# Patient Record
Sex: Female | Born: 1950 | Race: White | Hispanic: No | Smoking: Current every day smoker
Health system: Southern US, Community
[De-identification: ages and names within clinical notes are randomized; demographics above are authoritative.]

## PROBLEM LIST (undated history)

## (undated) DIAGNOSIS — K219 Gastro-esophageal reflux disease without esophagitis: Secondary | ICD-10-CM

## (undated) DIAGNOSIS — I10 Essential (primary) hypertension: Secondary | ICD-10-CM

## (undated) DIAGNOSIS — F32A Depression, unspecified: Secondary | ICD-10-CM

## (undated) DIAGNOSIS — F4541 Pain disorder exclusively related to psychological factors: Secondary | ICD-10-CM

## (undated) DIAGNOSIS — R51 Headache: Secondary | ICD-10-CM

## (undated) DIAGNOSIS — E785 Hyperlipidemia, unspecified: Secondary | ICD-10-CM

## (undated) DIAGNOSIS — B009 Herpesviral infection, unspecified: Secondary | ICD-10-CM

## (undated) DIAGNOSIS — E782 Mixed hyperlipidemia: Secondary | ICD-10-CM

## (undated) DIAGNOSIS — R42 Dizziness and giddiness: Secondary | ICD-10-CM

## (undated) DIAGNOSIS — J439 Emphysema, unspecified: Secondary | ICD-10-CM

## (undated) DIAGNOSIS — R918 Other nonspecific abnormal finding of lung field: Secondary | ICD-10-CM

## (undated) DIAGNOSIS — I712 Thoracic aortic aneurysm, without rupture, unspecified: Secondary | ICD-10-CM

## (undated) DIAGNOSIS — Z8719 Personal history of other diseases of the digestive system: Secondary | ICD-10-CM

## (undated) DIAGNOSIS — R29898 Other symptoms and signs involving the musculoskeletal system: Secondary | ICD-10-CM

## (undated) DIAGNOSIS — I129 Hypertensive chronic kidney disease with stage 1 through stage 4 chronic kidney disease, or unspecified chronic kidney disease: Secondary | ICD-10-CM

## (undated) DIAGNOSIS — R519 Headache, unspecified: Secondary | ICD-10-CM

## (undated) DIAGNOSIS — E559 Vitamin D deficiency, unspecified: Secondary | ICD-10-CM

## (undated) DIAGNOSIS — E039 Hypothyroidism, unspecified: Secondary | ICD-10-CM

## (undated) DIAGNOSIS — I5189 Other ill-defined heart diseases: Secondary | ICD-10-CM

## (undated) DIAGNOSIS — F419 Anxiety disorder, unspecified: Secondary | ICD-10-CM

## (undated) DIAGNOSIS — G4700 Insomnia, unspecified: Secondary | ICD-10-CM

## (undated) DIAGNOSIS — Z7982 Long term (current) use of aspirin: Secondary | ICD-10-CM

## (undated) DIAGNOSIS — M810 Age-related osteoporosis without current pathological fracture: Secondary | ICD-10-CM

## (undated) DIAGNOSIS — I2510 Atherosclerotic heart disease of native coronary artery without angina pectoris: Secondary | ICD-10-CM

## (undated) DIAGNOSIS — F329 Major depressive disorder, single episode, unspecified: Secondary | ICD-10-CM

## (undated) DIAGNOSIS — G5600 Carpal tunnel syndrome, unspecified upper limb: Secondary | ICD-10-CM

## (undated) DIAGNOSIS — I716 Thoracoabdominal aortic aneurysm, without rupture, unspecified: Secondary | ICD-10-CM

## (undated) DIAGNOSIS — I719 Aortic aneurysm of unspecified site, without rupture: Secondary | ICD-10-CM

## (undated) HISTORY — DX: Herpesviral infection, unspecified: B00.9

## (undated) HISTORY — DX: Hyperlipidemia, unspecified: E78.5

## (undated) HISTORY — DX: Carpal tunnel syndrome, unspecified upper limb: G56.00

## (undated) HISTORY — PX: TUBAL LIGATION: SHX77

## (undated) HISTORY — DX: Major depressive disorder, single episode, unspecified: F32.9

## (undated) HISTORY — DX: Anxiety disorder, unspecified: F41.9

## (undated) HISTORY — PX: COLONOSCOPY: SHX174

## (undated) HISTORY — DX: Depression, unspecified: F32.A

## (undated) HISTORY — DX: Age-related osteoporosis without current pathological fracture: M81.0

## (undated) HISTORY — DX: Insomnia, unspecified: G47.00

## (undated) HISTORY — DX: Gastro-esophageal reflux disease without esophagitis: K21.9

---

## 2008-10-08 DIAGNOSIS — R42 Dizziness and giddiness: Secondary | ICD-10-CM

## 2008-10-08 HISTORY — DX: Dizziness and giddiness: R42

## 2010-12-14 ENCOUNTER — Ambulatory Visit: Payer: Self-pay | Admitting: Family Medicine

## 2012-06-30 ENCOUNTER — Ambulatory Visit: Payer: Self-pay | Admitting: Internal Medicine

## 2012-07-02 ENCOUNTER — Ambulatory Visit: Payer: Self-pay | Admitting: Physician Assistant

## 2016-04-13 ENCOUNTER — Other Ambulatory Visit: Payer: Self-pay | Admitting: Physician Assistant

## 2016-04-13 DIAGNOSIS — R10819 Abdominal tenderness, unspecified site: Secondary | ICD-10-CM

## 2016-04-13 DIAGNOSIS — R634 Abnormal weight loss: Secondary | ICD-10-CM

## 2016-05-02 ENCOUNTER — Ambulatory Visit
Admission: RE | Admit: 2016-05-02 | Discharge: 2016-05-02 | Disposition: A | Discharge status: Home / Self Care | Payer: BLUE CROSS/BLUE SHIELD | Source: Ambulatory Visit | Attending: Physician Assistant | Admitting: Physician Assistant

## 2016-05-02 DIAGNOSIS — R911 Solitary pulmonary nodule: Secondary | ICD-10-CM | POA: Insufficient documentation

## 2016-05-02 DIAGNOSIS — R10819 Abdominal tenderness, unspecified site: Secondary | ICD-10-CM | POA: Insufficient documentation

## 2016-05-02 DIAGNOSIS — R634 Abnormal weight loss: Secondary | ICD-10-CM | POA: Diagnosis present

## 2016-05-02 LAB — POCT I-STAT CREATININE: Creatinine, Ser: 1 mg/dL (ref 0.44–1.00)

## 2016-05-02 MED ORDER — IOPAMIDOL (ISOVUE-300) INJECTION 61%
85.0000 mL | Freq: Once | INTRAVENOUS | Status: AC | PRN
  Administered 2016-05-02: 85 mL via INTRAVENOUS

## 2016-05-10 ENCOUNTER — Other Ambulatory Visit: Payer: Self-pay | Admitting: Physician Assistant

## 2016-05-10 DIAGNOSIS — R911 Solitary pulmonary nodule: Secondary | ICD-10-CM

## 2016-05-16 ENCOUNTER — Ambulatory Visit
Admission: RE | Admit: 2016-05-16 | Discharge: 2016-05-16 | Disposition: A | Discharge status: Home / Self Care | Payer: BLUE CROSS/BLUE SHIELD | Source: Ambulatory Visit | Attending: Physician Assistant | Admitting: Physician Assistant

## 2016-05-16 DIAGNOSIS — I700 Atherosclerosis of aorta: Secondary | ICD-10-CM | POA: Diagnosis not present

## 2016-05-16 DIAGNOSIS — R911 Solitary pulmonary nodule: Secondary | ICD-10-CM

## 2016-05-16 DIAGNOSIS — I2510 Atherosclerotic heart disease of native coronary artery without angina pectoris: Secondary | ICD-10-CM | POA: Insufficient documentation

## 2016-05-16 DIAGNOSIS — R918 Other nonspecific abnormal finding of lung field: Secondary | ICD-10-CM | POA: Insufficient documentation

## 2016-06-08 ENCOUNTER — Telehealth: Payer: Self-pay | Admitting: Gastroenterology

## 2016-06-08 ENCOUNTER — Other Ambulatory Visit: Payer: Self-pay

## 2016-06-08 NOTE — Telephone Encounter (Signed)
Dr Humphrey Rolls office need STAT EGD scheduled for patient. You can call Magda Paganini at Dr Laurelyn Sickle office. [phone] ext [phone]

## 2016-06-08 NOTE — Telephone Encounter (Signed)
I spoke with Joy Carpenter from Dr. Laurelyn Sickle office. Per Dr. Allen Norris, I will schedule the patient for an upper endoscopy on either Wednesday 6th at 8:00 am or on Thursday the 7th.   I called the patient and left her a voicemail so that she can call me to schedule her upper endoscopy. If I don't hear back from the patient today, I will call her on Tuesday ( We are closed on Monday 06/11/16 for Labor Day)

## 2016-06-12 ENCOUNTER — Other Ambulatory Visit: Payer: Self-pay

## 2016-06-12 ENCOUNTER — Encounter: Payer: Self-pay | Admitting: *Deleted

## 2016-06-12 MED ORDER — NA SULFATE-K SULFATE-MG SULF 17.5-3.13-1.6 GM/177ML PO SOLN: 1.0000 | Freq: Once | ORAL | 0 refills | Status: AC

## 2016-06-12 NOTE — Telephone Encounter (Signed)
Gastroenterology Pre-Procedure Review  Request Date: 06/14/2016 Requesting Physician: Dr. Humphrey Rolls  PATIENT REVIEW QUESTIONS: The patient responded to the following health history questions as indicated:    1. Are you having any GI issues? yes (abdominal pain) 2. Do you have a personal history of Polyps? no 3. Do you have a family history of Colon Cancer or Polyps? no 4. Diabetes Mellitus? no 5. Joint replacements in the past 12 months?no 6. Major health problems in the past 3 months?no 7. Any artificial heart valves, MVP, or defibrillator?no    MEDICATIONS & ALLERGIES:    Patient reports the following regarding taking any anticoagulation/antiplatelet therapy:   Plavix, Coumadin, Eliquis, Xarelto, Lovenox, Pradaxa, Brilinta, or Effient? no Aspirin? yes ([facility])  Patient confirms/reports the following medications:  Current Outpatient Prescriptions  Medication Sig Dispense Refill  . ALPRAZolam (XANAX) 0.25 MG tablet Take 0.25 mg by mouth daily as needed. for anxiety  3  . aspirin EC 81 MG tablet Take 81 mg by mouth daily.    . ciprofloxacin (CIPRO) 500 MG tablet Take 500 mg by mouth 2 (two) times daily.  0  . levothyroxine (SYNTHROID, LEVOTHROID) 50 MCG tablet ONE TAB BY MOUTH IN THE MORNING 30 MINUTES PRIOR TO FOOD.  1  . pantoprazole (PROTONIX) 40 MG tablet Take 40 mg by mouth 2 (two) times daily.  2  . polyethylene glycol powder (GLYCOLAX/MIRALAX) powder AS DIRECTED FOR COLONIC PREP.  0  . simvastatin (ZOCOR) 20 MG tablet Take 20 mg by mouth daily.  1  . zolpidem (AMBIEN) 10 MG tablet Take 10 mg by mouth at bedtime.  3  . valACYclovir (VALTREX) 500 MG tablet TAKE 1 TABLET BY MOUTH EVERY DAY FOR OUTBREAK AS NEEDED  5   No current facility-administered medications for this visit.     Patient confirms/reports the following allergies:  No Known Allergies  No orders of the defined types were placed in this encounter.   AUTHORIZATION INFORMATION Primary Insurance: 1D#: Group  #:  Secondary Insurance: 1D#: Group #:  SCHEDULE INFORMATION: Date: 06/14/2016 Time: Location: [facility]

## 2016-06-12 NOTE — Telephone Encounter (Signed)
Dysphagia R13.10 Screening Colonoscopy Z12.11 [facility] [id number] Pre cert

## 2016-06-13 NOTE — Discharge Instructions (Signed)

## 2016-06-14 ENCOUNTER — Ambulatory Visit: Payer: BLUE CROSS/BLUE SHIELD | Admitting: Anesthesiology

## 2016-06-14 ENCOUNTER — Encounter
Admission: RE | Disposition: A | Discharge status: Home / Self Care | Payer: Self-pay | Source: Ambulatory Visit | Attending: Gastroenterology

## 2016-06-14 ENCOUNTER — Ambulatory Visit
Admission: RE | Admit: 2016-06-14 | Discharge: 2016-06-14 | Disposition: A | Discharge status: Home / Self Care | Payer: BLUE CROSS/BLUE SHIELD | Source: Ambulatory Visit | Attending: Gastroenterology | Admitting: Gastroenterology

## 2016-06-14 DIAGNOSIS — Z79899 Other long term (current) drug therapy: Secondary | ICD-10-CM

## 2016-06-14 DIAGNOSIS — R198 Other specified symptoms and signs involving the digestive system and abdomen: Secondary | ICD-10-CM

## 2016-06-14 DIAGNOSIS — F17210 Nicotine dependence, cigarettes, uncomplicated: Secondary | ICD-10-CM | POA: Diagnosis not present

## 2016-06-14 DIAGNOSIS — Z7982 Long term (current) use of aspirin: Secondary | ICD-10-CM | POA: Diagnosis not present

## 2016-06-14 DIAGNOSIS — K219 Gastro-esophageal reflux disease without esophagitis: Secondary | ICD-10-CM | POA: Diagnosis not present

## 2016-06-14 DIAGNOSIS — R1013 Epigastric pain: Secondary | ICD-10-CM

## 2016-06-14 DIAGNOSIS — R634 Abnormal weight loss: Secondary | ICD-10-CM | POA: Insufficient documentation

## 2016-06-14 DIAGNOSIS — K228 Other specified diseases of esophagus: Secondary | ICD-10-CM | POA: Diagnosis not present

## 2016-06-14 DIAGNOSIS — K2950 Unspecified chronic gastritis without bleeding: Secondary | ICD-10-CM | POA: Insufficient documentation

## 2016-06-14 DIAGNOSIS — Z681 Body mass index (BMI) 19 or less, adult: Secondary | ICD-10-CM | POA: Insufficient documentation

## 2016-06-14 DIAGNOSIS — J449 Chronic obstructive pulmonary disease, unspecified: Secondary | ICD-10-CM | POA: Diagnosis not present

## 2016-06-14 DIAGNOSIS — R1310 Dysphagia, unspecified: Secondary | ICD-10-CM | POA: Diagnosis not present

## 2016-06-14 DIAGNOSIS — K648 Other hemorrhoids: Secondary | ICD-10-CM | POA: Insufficient documentation

## 2016-06-14 DIAGNOSIS — I739 Peripheral vascular disease, unspecified: Secondary | ICD-10-CM | POA: Diagnosis not present

## 2016-06-14 DIAGNOSIS — K2970 Gastritis, unspecified, without bleeding: Secondary | ICD-10-CM

## 2016-06-14 DIAGNOSIS — Z1211 Encounter for screening for malignant neoplasm of colon: Secondary | ICD-10-CM | POA: Diagnosis not present

## 2016-06-14 DIAGNOSIS — R933 Abnormal findings on diagnostic imaging of other parts of digestive tract: Secondary | ICD-10-CM

## 2016-06-14 DIAGNOSIS — K2289 Other specified disease of esophagus: Secondary | ICD-10-CM

## 2016-06-14 DIAGNOSIS — K5730 Diverticulosis of large intestine without perforation or abscess without bleeding: Secondary | ICD-10-CM | POA: Diagnosis not present

## 2016-06-14 HISTORY — DX: Headache, unspecified: R51.9

## 2016-06-14 HISTORY — PX: ESOPHAGOGASTRODUODENOSCOPY (EGD) WITH PROPOFOL: SHX5813

## 2016-06-14 HISTORY — DX: Other symptoms and signs involving the musculoskeletal system: R29.898

## 2016-06-14 HISTORY — PX: COLONOSCOPY WITH PROPOFOL: SHX5780

## 2016-06-14 HISTORY — DX: Emphysema, unspecified: J43.9

## 2016-06-14 HISTORY — DX: Dizziness and giddiness: R42

## 2016-06-14 HISTORY — DX: Headache: R51

## 2016-06-14 HISTORY — DX: Aortic aneurysm of unspecified site, without rupture: I71.9

## 2016-06-14 SURGERY — ESOPHAGOGASTRODUODENOSCOPY (EGD) WITH PROPOFOL
Anesthesia: Monitor Anesthesia Care | Wound class: Clean Contaminated

## 2016-06-14 MED ORDER — LACTATED RINGERS IV SOLN
INTRAVENOUS | Status: DC | PRN
  Administered 2016-06-14: 12:00:00 via INTRAVENOUS

## 2016-06-14 MED ORDER — STERILE WATER FOR IRRIGATION IR SOLN
Status: DC | PRN
  Administered 2016-06-14: 12:00:00

## 2016-06-14 MED ORDER — PROPOFOL 10 MG/ML IV BOLUS
INTRAVENOUS | Status: DC | PRN
  Administered 2016-06-14 (×2): 20 mg via INTRAVENOUS
  Administered 2016-06-14: 10 mg via INTRAVENOUS
  Administered 2016-06-14: 20 mg via INTRAVENOUS
  Administered 2016-06-14: 30 mg via INTRAVENOUS
  Administered 2016-06-14: 10 mg via INTRAVENOUS
  Administered 2016-06-14: 60 mg via INTRAVENOUS
  Administered 2016-06-14 (×4): 20 mg via INTRAVENOUS

## 2016-06-14 MED ORDER — LACTATED RINGERS IV SOLN: INTRAVENOUS | Status: DC

## 2016-06-14 MED ORDER — GLYCOPYRROLATE 0.2 MG/ML IJ SOLN
INTRAMUSCULAR | Status: DC | PRN
  Administered 2016-06-14: 0.2 mg via INTRAVENOUS

## 2016-06-14 MED ORDER — ACETAMINOPHEN 160 MG/5ML PO SOLN: 325.0000 mg | ORAL | Status: DC | PRN

## 2016-06-14 MED ORDER — LIDOCAINE HCL (CARDIAC) 20 MG/ML IV SOLN
INTRAVENOUS | Status: DC | PRN
  Administered 2016-06-14: 50 mg via INTRAVENOUS

## 2016-06-14 MED ORDER — ACETAMINOPHEN 325 MG PO TABS: 325.0000 mg | ORAL_TABLET | ORAL | Status: DC | PRN

## 2016-06-14 SURGICAL SUPPLY — 35 items
BALLN DILATOR 10-12 8 (BALLOONS)
BALLN DILATOR 12-15 8 (BALLOONS)
BALLN DILATOR 15-18 8 (BALLOONS)
BALLN DILATOR CRE 0-12 8 (BALLOONS)
BALLN DILATOR ESOPH 8 10 CRE (MISCELLANEOUS) IMPLANT
BALLOON DILATOR 12-15 8 (BALLOONS) IMPLANT
BALLOON DILATOR 15-18 8 (BALLOONS) IMPLANT
BALLOON DILATOR CRE 0-12 8 (BALLOONS) IMPLANT
BLOCK BITE 60FR ADLT L/F GRN (MISCELLANEOUS) ×2 IMPLANT
CANISTER SUCT 1200ML W/VALVE (MISCELLANEOUS) ×2 IMPLANT
CLIP HMST 235XBRD CATH ROT (MISCELLANEOUS) IMPLANT
CLIP RESOLUTION 360 11X235 (MISCELLANEOUS)
FCP ESCP3.2XJMB 240X2.8X (MISCELLANEOUS)
FORCEPS BIOP RAD 4 LRG CAP 4 (CUTTING FORCEPS) ×2 IMPLANT
FORCEPS BIOP RJ4 240 W/NDL (MISCELLANEOUS)
FORCEPS ESCP3.2XJMB 240X2.8X (MISCELLANEOUS) IMPLANT
GOWN CVR UNV OPN BCK APRN NK (MISCELLANEOUS) ×2 IMPLANT
GOWN ISOL THUMB LOOP REG UNIV (MISCELLANEOUS) ×2
INJECTOR VARIJECT VIN23 (MISCELLANEOUS) IMPLANT
KIT DEFENDO VALVE AND CONN (KITS) IMPLANT
KIT ENDO PROCEDURE OLY (KITS) ×2 IMPLANT
MARKER SPOT ENDO TATTOO 5ML (MISCELLANEOUS) IMPLANT
PAD GROUND ADULT SPLIT (MISCELLANEOUS) IMPLANT
PROBE APC STR FIRE (PROBE) IMPLANT
RETRIEVER NET PLAT FOOD (MISCELLANEOUS) IMPLANT
RETRIEVER NET ROTH 2.5X230 LF (MISCELLANEOUS) IMPLANT
SNARE SHORT THROW 13M SML OVAL (MISCELLANEOUS) IMPLANT
SNARE SHORT THROW 30M LRG OVAL (MISCELLANEOUS) IMPLANT
SNARE SNG USE RND 15MM (INSTRUMENTS) IMPLANT
SPOT EX ENDOSCOPIC TATTOO (MISCELLANEOUS)
SYR INFLATION 60ML (SYRINGE) IMPLANT
TRAP ETRAP POLY (MISCELLANEOUS) IMPLANT
VARIJECT INJECTOR VIN23 (MISCELLANEOUS)
WATER STERILE IRR 250ML POUR (IV SOLUTION) ×2 IMPLANT
WIRE CRE 18-20MM 8CM F G (MISCELLANEOUS) IMPLANT

## 2016-06-14 NOTE — Anesthesia Preprocedure Evaluation (Signed)
Anesthesia Evaluation  Patient identified by MRN, date of birth, ID band Patient awake    Reviewed: Allergy & Precautions, H&P , NPO status , Patient's Chart, lab work & pertinent test results  Airway Mallampati: II  TM Distance: >3 FB Neck ROM: full    Dental no notable dental hx.    Pulmonary COPD, Current Smoker,    Pulmonary exam normal        Cardiovascular + Peripheral Vascular Disease  Normal cardiovascular exam     Neuro/Psych    GI/Hepatic   Endo/Other    Renal/GU      Musculoskeletal   Abdominal   Peds  Hematology   Anesthesia Other Findings   Reproductive/Obstetrics                             Anesthesia Physical Anesthesia Plan  ASA: III  Anesthesia Plan: MAC   Post-op Pain Management:    Induction:   Airway Management Planned:   Additional Equipment:   Intra-op Plan:   Post-operative Plan:   Informed Consent: I have reviewed the patients History and Physical, chart, labs and discussed the procedure including the risks, benefits and alternatives for the proposed anesthesia with the patient or authorized representative who has indicated his/her understanding and acceptance.     Plan Discussed with:   Anesthesia Plan Comments:         Anesthesia Quick Evaluation

## 2016-06-14 NOTE — Anesthesia Procedure Notes (Signed)
Procedure Name: MAC Performed by: Zissel Biederman Pre-anesthesia Checklist: Patient identified, Emergency Drugs available, Suction available, Timeout performed and Patient being monitored Patient Re-evaluated:Patient Re-evaluated prior to inductionOxygen Delivery Method: Nasal cannula Placement Confirmation: positive ETCO2       

## 2016-06-14 NOTE — H&P (Signed)
Joy Lame, MD [address] [place], [place] [address] Phone: [phone] Fax : [phone]  Primary Care Physician:  Christie Nottingham., PA Primary Gastroenterologist:  Dr. Allen Norris  Pre-Procedure History & Physical: HPI:  Joy Carpenter is a 65 y.o. female is here for an endoscopy and colonoscopy.   Past Medical History:  Diagnosis Date  . Aortic aneurysm ([place])   . Emphysema of lung ([place])    "mild"  . Headache    daily - stress  . Vertigo    1 episode - approx 2010   . Weakness of both legs    intermittent    Past Surgical History:  Procedure Laterality Date  . COLONOSCOPY    . TUBAL LIGATION      Prior to Admission medications   Medication Sig Start Date End Date Taking? Authorizing Provider  ALPRAZolam (XANAX) 0.25 MG tablet Take 0.25 mg by mouth daily as needed. for anxiety 05/23/16  Yes Historical Provider, MD  aspirin EC 81 MG tablet Take 81 mg by mouth daily.   Yes Historical Provider, MD  ciprofloxacin (CIPRO) 500 MG tablet Take 500 mg by mouth 2 (two) times daily. 06/07/16  Yes Historical Provider, MD  levothyroxine (SYNTHROID, LEVOTHROID) 50 MCG tablet ONE TAB BY MOUTH IN THE MORNING 30 MINUTES PRIOR TO FOOD. 06/04/16  Yes Historical Provider, MD  pantoprazole (PROTONIX) 40 MG tablet Take 40 mg by mouth 2 (two) times daily. 05/25/16  Yes Historical Provider, MD  polyethylene glycol powder (GLYCOLAX/MIRALAX) powder AS DIRECTED FOR COLONIC PREP. 05/30/16  Yes Historical Provider, MD  simvastatin (ZOCOR) 20 MG tablet Take 20 mg by mouth daily. 06/04/16  Yes Historical Provider, MD  valACYclovir (VALTREX) 500 MG tablet TAKE 1 TABLET BY MOUTH EVERY DAY FOR OUTBREAK AS NEEDED 03/09/16  Yes Historical Provider, MD  zolpidem (AMBIEN) 10 MG tablet Take 10 mg by mouth at bedtime. 05/10/16  Yes Historical Provider, MD    Allergies as of 06/12/2016  . (No Known Allergies)    History reviewed. No pertinent family history.  Social History   Social History  .  Marital status: Divorced    Spouse name: N/A  . Number of children: N/A  . Years of education: N/A   Occupational History  . Not on file.   Social History Main Topics  . Smoking status: Current Every Day Smoker    Packs/day: 1.00    Years: 30.00    Types: Cigarettes  . Smokeless tobacco: Never Used  . Alcohol use No  . Drug use: Unknown  . Sexual activity: Not on file   Other Topics Concern  . Not on file   Social History Narrative  . No narrative on file    Review of Systems: See HPI, otherwise negative ROS  Physical Exam: BP 118/75   Pulse 70   Temp 97.3 F (36.3 C) (Temporal)   Resp 16   Ht 5\' 7"  (1.702 m)   Wt 125 lb (56.7 kg)   SpO2 96%   BMI 19.58 kg/m  General:   Alert,  pleasant and cooperative in NAD Head:  Normocephalic and atraumatic. Neck:  Supple; no masses or thyromegaly. Lungs:  Clear throughout to auscultation.    Heart:  Regular rate and rhythm. Abdomen:  Soft, nontender and nondistended. Normal bowel sounds, without guarding, and without rebound.   Neurologic:  Alert and  oriented x4;  grossly normal neurologically.  Impression/Plan: [name] is here for an endoscopy and colonoscopy to be performed  for dysphagia with weight loss.  Risks, benefits, limitations, and alternatives regarding  endoscopy and colonoscopy have been reviewed with the patient.  Questions have been answered.  All parties agreeable.   Joy Lame, MD  06/14/2016, 11:09 AM

## 2016-06-14 NOTE — Transfer of Care (Signed)
Immediate Anesthesia Transfer of Care Note  Patient: Joy Carpenter  Procedure(s) Performed: Procedure(s): ESOPHAGOGASTRODUODENOSCOPY (EGD) WITH PROPOFOL (N/A) COLONOSCOPY WITH PROPOFOL (N/A)  Patient Location: PACU  Anesthesia Type: MAC  Level of Consciousness: awake, alert  and patient cooperative  Airway and Oxygen Therapy: Patient Spontanous Breathing and Patient connected to supplemental oxygen  Post-op Assessment: Post-op Vital signs reviewed, Patient's Cardiovascular Status Stable, Respiratory Function Stable, Patent Airway and No signs of Nausea or vomiting  Post-op Vital Signs: Reviewed and stable  Complications: No apparent anesthesia complications

## 2016-06-14 NOTE — Op Note (Signed)
[facility] Gastroenterology Patient Name: Joy Carpenter Procedure Date: 06/14/2016 11:29 AM MRN: [id number] Account #: [id number] Date of Birth: 1951-04-12 Admit Type: Outpatient Age: 65 Room: [facility] OR ROOM 01 Gender: Female Note Status: Finalized Procedure:            Colonoscopy Indications:          Screening for colorectal malignant neoplasm Providers:            Lucilla Lame MD, MD Medicines:            Propofol per Anesthesia Complications:        No immediate complications. Procedure:            Pre-Anesthesia Assessment:                       - Prior to the procedure, a History and Physical was                        performed, and patient medications and allergies were                        reviewed. The patient's tolerance of previous                        anesthesia was also reviewed. The risks and benefits of                        the procedure and the sedation options and risks were                        discussed with the patient. All questions were                        answered, and informed consent was obtained. Prior                        Anticoagulants: The patient has taken no previous                        anticoagulant or antiplatelet agents. ASA Grade                        Assessment: II - A patient with mild systemic disease.                        After reviewing the risks and benefits, the patient was                        deemed in satisfactory condition to undergo the                        procedure.                       After obtaining informed consent, the colonoscope was                        passed under direct vision. Throughout the procedure,                        the patient's blood pressure,  pulse, and oxygen                        saturations were monitored continuously. The Olympus                        CF-HQ190L Colonoscope (S#. [id number]) was introduced                        through the anus and advanced to the  the cecum,                        identified by appendiceal orifice and ileocecal valve.                        The colonoscopy was performed without difficulty. The                        patient tolerated the procedure well. The quality of                        the bowel preparation was excellent. Findings:      The perianal and digital rectal examinations were normal.      Multiple small-mouthed diverticula were found in the sigmoid colon.      Non-bleeding internal hemorrhoids were found during retroflexion. The       hemorrhoids were Grade II (internal hemorrhoids that prolapse but reduce       spontaneously). Impression:           - Diverticulosis in the sigmoid colon.                       - Non-bleeding internal hemorrhoids.                       - No specimens collected. Recommendation:       - Repeat colonoscopy in 10 years for screening unless                        any change in family history or lower GI problems. Procedure Code(s):    --- Professional ---                       [phone], Colonoscopy, flexible; diagnostic, including                        collection of specimen(s) by brushing or washing, when                        performed (separate procedure) Diagnosis Code(s):    --- Professional ---                       Z12.11, Encounter for screening for malignant neoplasm                        of colon CPT copyright 2016 American Medical Association. All rights reserved. The codes documented in this report are preliminary and upon coder review may  be revised to meet current compliance requirements. Lucilla Lame MD, MD 06/14/2016 11:58:13 AM This report has been signed electronically. Number of Addenda: 0 Note Initiated On: 06/14/2016 11:29  AM Scope Withdrawal Time: 0 hours 6 minutes 2 seconds  Total Procedure Duration: 0 hours 8 minutes 3 seconds       [facility]

## 2016-06-14 NOTE — Op Note (Signed)
[facility] Gastroenterology Patient Name: Joy Carpenter Procedure Date: 06/14/2016 11:29 AM MRN: [id number] Account #: [id number] Date of Birth: 1951-03-22 Admit Type: Outpatient Age: 65 Room: [facility] OR ROOM 01 Gender: Female Note Status: Finalized Procedure:            Upper GI endoscopy Indications:          Epigastric abdominal pain, Abnormal CT of the GI tract Providers:            Lucilla Lame MD, MD Referring MD:         Leighton Ruff. Turner PA, PA (Referring MD) Medicines:            Propofol per Anesthesia Complications:        No immediate complications. Procedure:            Pre-Anesthesia Assessment:                       - Prior to the procedure, a History and Physical was                        performed, and patient medications and allergies were                        reviewed. The patient's tolerance of previous                        anesthesia was also reviewed. The risks and benefits of                        the procedure and the sedation options and risks were                        discussed with the patient. All questions were                        answered, and informed consent was obtained. Prior                        Anticoagulants: The patient has taken no previous                        anticoagulant or antiplatelet agents. ASA Grade                        Assessment: II - A patient with mild systemic disease.                        After reviewing the risks and benefits, the patient was                        deemed in satisfactory condition to undergo the                        procedure.                       After obtaining informed consent, the endoscope was                        passed under direct vision. Throughout the procedure,  the patient's blood pressure, pulse, and oxygen                        saturations were monitored continuously. The Olympus                        [id number]-HQ190 Endoscope (S#. [id number]) was  introduced                        through the mouth, and advanced to the second part of                        duodenum. The upper GI endoscopy was accomplished                        without difficulty. The patient tolerated the procedure                        well. Findings:      There were esophageal mucosal changes suspicious for long-segment       Barrett's esophagus present in the lower third of the esophagus. Mucosa       was biopsied with a cold forceps for histology randomly in the lower       third of the esophagus.      Localized moderate inflammation characterized by erythema was found in       the gastric antrum. Biopsies were taken with a cold forceps for       histology.      The examined duodenum was normal. Impression:           - Esophageal mucosal changes suspicious for                        long-segment Barrett's esophagus. Biopsied.                       - Gastritis. Biopsied.                       - Normal examined duodenum. Recommendation:       - Await pathology results.                       - Discharge patient to home.                       - Resume previous diet.                       - Continue present medications. Procedure Code(s):    --- Professional ---                       [phone], Esophagogastroduodenoscopy, flexible, transoral;                        with biopsy, single or multiple Diagnosis Code(s):    --- Professional ---                       R10.13, Epigastric pain                       R93.3, Abnormal findings on diagnostic imaging of other  parts of digestive tract                       K29.70, Gastritis, unspecified, without bleeding                       K22.8, Other specified diseases of esophagus CPT copyright 2016 American Medical Association. All rights reserved. The codes documented in this report are preliminary and upon coder review may  be revised to meet current compliance requirements. Lucilla Lame MD,  MD 06/14/2016 11:47:18 AM This report has been signed electronically. Number of Addenda: 0 Note Initiated On: 06/14/2016 11:29 AM Total Procedure Duration: 0 hours 3 minutes 37 seconds       [facility]

## 2016-06-14 NOTE — Anesthesia Postprocedure Evaluation (Signed)
Anesthesia Post Note  Patient: Joy Carpenter  Procedure(s) Performed: Procedure(s) (LRB): ESOPHAGOGASTRODUODENOSCOPY (EGD) WITH PROPOFOL (N/A) COLONOSCOPY WITH PROPOFOL (N/A)  Patient location during evaluation: PACU Anesthesia Type: MAC Level of consciousness: awake and alert and oriented Pain management: satisfactory to patient Vital Signs Assessment: post-procedure vital signs reviewed and stable Respiratory status: spontaneous breathing, nonlabored ventilation and respiratory function stable Cardiovascular status: blood pressure returned to baseline and stable Postop Assessment: Adequate PO intake and No signs of nausea or vomiting Anesthetic complications: no    Raliegh Ip

## 2016-06-15 ENCOUNTER — Encounter: Payer: Self-pay | Admitting: Gastroenterology

## 2016-06-18 ENCOUNTER — Telehealth: Payer: Self-pay | Admitting: Gastroenterology

## 2016-06-18 NOTE — Telephone Encounter (Signed)
Left a voice message for patient to call and schedule appointment with Dr. Allen Norris for GERD. Referred by [facility].

## 2016-06-20 ENCOUNTER — Encounter: Payer: Self-pay | Admitting: Gastroenterology

## 2016-06-21 ENCOUNTER — Encounter: Payer: Self-pay | Admitting: Gastroenterology

## 2016-07-31 ENCOUNTER — Encounter (INDEPENDENT_AMBULATORY_CARE_PROVIDER_SITE_OTHER): Payer: Self-pay

## 2016-07-31 ENCOUNTER — Ambulatory Visit (INDEPENDENT_AMBULATORY_CARE_PROVIDER_SITE_OTHER): Payer: BLUE CROSS/BLUE SHIELD | Admitting: Vascular Surgery

## 2016-07-31 ENCOUNTER — Encounter (INDEPENDENT_AMBULATORY_CARE_PROVIDER_SITE_OTHER): Payer: Self-pay | Admitting: Vascular Surgery

## 2016-07-31 VITALS — BP 119/69 | HR 72 | Resp 16 | Ht 66.0 in | Wt 134.0 lb

## 2016-07-31 DIAGNOSIS — I714 Abdominal aortic aneurysm, without rupture, unspecified: Secondary | ICD-10-CM | POA: Insufficient documentation

## 2016-07-31 DIAGNOSIS — E785 Hyperlipidemia, unspecified: Secondary | ICD-10-CM | POA: Insufficient documentation

## 2016-07-31 DIAGNOSIS — I70219 Atherosclerosis of native arteries of extremities with intermittent claudication, unspecified extremity: Secondary | ICD-10-CM | POA: Insufficient documentation

## 2016-07-31 DIAGNOSIS — R1013 Epigastric pain: Secondary | ICD-10-CM | POA: Diagnosis not present

## 2016-07-31 DIAGNOSIS — I70213 Atherosclerosis of native arteries of extremities with intermittent claudication, bilateral legs: Secondary | ICD-10-CM

## 2016-07-31 DIAGNOSIS — F17200 Nicotine dependence, unspecified, uncomplicated: Secondary | ICD-10-CM | POA: Insufficient documentation

## 2016-07-31 NOTE — Assessment & Plan Note (Signed)
Unclear cause.  Work up ongoing.  Unlikely vascular in origin

## 2016-07-31 NOTE — Assessment & Plan Note (Signed)
The patient has noninvasive studies performed by her primary care physician which demonstrated ABIs of approximately 0.9 bilaterally with duplex suggesting more distal disease on ultrasound. Her symptoms are mild to moderate this point and not lifestyle limiting. She continues to work. I have discussed with her appropriate medical therapy with aspirin and statin agent which is already being used. I have strongly recommended tobacco cessation as well as increasing her exercise regimen. No intervention will currently be performed, but I will plan a follow-up visit and we are checking her aneurysm in 4-6 months with follow-up noninvasive studies.

## 2016-07-31 NOTE — Assessment & Plan Note (Signed)
We discussed the absolute need for smoking cessation due to the deleterious nature of tobacco on the vascular system. We discussed the tobacco use would diminish patency of any intervention, and likely significantly worsen progressio of disease. We discussed multiple agents for quitting including replacement therapy or medications to reduce cravings such as Chantix. The patient voices their understanding of the importance of smoking cessation. 

## 2016-07-31 NOTE — Assessment & Plan Note (Signed)
lipid control important in reducing the progression of atherosclerotic disease. Continue statin therapy  

## 2016-07-31 NOTE — Assessment & Plan Note (Signed)
I have independently reviewed the patient's CT scan from a couple of months ago. 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 The maximal diameter is about 4 cm. There does not appear to be significant iliac artery occlusive disease present. I discussed the natural history and pathophysiology of abdominal aortic aneurysms. At this size, rupture would be far less than 1% and repair is not indicated. I would recommend a follow-up ultrasound in 4-6 months for continued surveillance and evaluation. I discussed the importance of tobacco cessation and blood pressure control

## 2016-07-31 NOTE — Patient Instructions (Signed)
Abdominal Aortic Aneurysm An aneurysm is a weakened or damaged part of an artery wall that bulges from the normal force of blood pumping through the body. An abdominal aortic aneurysm is an aneurysm that occurs in the lower part of the aorta, the main artery of the body.  The major concern with an abdominal aortic aneurysm is that it can enlarge and burst (rupture) or blood can flow between the layers of the wall of the aorta through a tear (aorticdissection). Both of these conditions can cause bleeding inside the body and can be life threatening unless diagnosed and treated promptly. CAUSES  The exact cause of an abdominal aortic aneurysm is unknown. Some contributing factors are:   A hardening of the arteries caused by the buildup of fat and other substances in the lining of a blood vessel (arteriosclerosis).  Inflammation of the walls of an artery (arteritis).   Connective tissue diseases, such as Marfan syndrome.   Abdominal trauma.   An infection, such as syphilis or staphylococcus, in the wall of the aorta (infectious aortitis) caused by bacteria. RISK FACTORS  Risk factors that contribute to an abdominal aortic aneurysm may include:  Age older than 60 years.   High blood pressure (hypertension).  Female gender.  Ethnicity (white race).  Obesity.  Family history of aneurysm (first degree relatives only).  Tobacco use. PREVENTION  The following healthy lifestyle habits may help decrease your risk of abdominal aortic aneurysm:  Quitting smoking. Smoking can raise your blood pressure and cause arteriosclerosis.  Limiting or avoiding alcohol.  Keeping your blood pressure, blood sugar level, and cholesterol levels within normal limits.  Decreasing your salt intake. In somepeople, too much salt can raise blood pressure and increase your risk of abdominal aortic aneurysm.  Eating a diet low in saturated fats and cholesterol.  Increasing your fiber intake by including  whole grains, vegetables, and fruits in your diet. Eating these foods may help lower blood pressure.  Maintaining a healthy weight.  Staying physically active and exercising regularly. SYMPTOMS  The symptoms of abdominal aortic aneurysm may vary depending on the size and rate of growth of the aneurysm.Most grow slowly and do not have any symptoms. When symptoms do occur, they may include:  Pain (abdomen, side, lower back, or groin). The pain may vary in intensity. A sudden onset of severe pain may indicate that the aneurysm has ruptured.  Feeling full after eating only small amounts of food.  Nausea or vomiting or both.  Feeling a pulsating lump in the abdomen.  Feeling faint or passing out. DIAGNOSIS  Since most unruptured abdominal aortic aneurysms have no symptoms, they are often discovered during diagnostic exams for other conditions. An aneurysm may be found during the following procedures:  Ultrasonography (A one-time screening for abdominal aortic aneurysm by ultrasonography is also recommended for all men aged 65-75 years who have ever smoked).  X-ray exams.  A computed tomography (CT).  Magnetic resonance imaging (MRI).  Angiography or arteriography. TREATMENT  Treatment of an abdominal aortic aneurysm depends on the size of your aneurysm, your age, and risk factors for rupture. Medication to control blood pressure and pain may be used to manage aneurysms smaller than 6 cm. Regular monitoring for enlargement may be recommended by your caregiver if:  The aneurysm is 3-4 cm in size (an annual ultrasonography may be recommended).  The aneurysm is 4-4.5 cm in size (an ultrasonography every 6 months may be recommended).  The aneurysm is larger than 4.5 cm in   size (your caregiver may ask that you be examined by a vascular surgeon). If your aneurysm is larger than 6 cm, surgical repair may be recommended. There are two main methods for repair of an aneurysm:   Endovascular  repair (a minimally invasive surgery). This is done most often.  Open repair. This method is used if an endovascular repair is not possible.   This information is not intended to replace advice given to you by your health care provider. Make sure you discuss any questions you have with your health care provider.   Document Released: 07/04/2005 Document Revised: 01/19/2013 Document Reviewed: 10/24/2012 Elsevier Interactive Patient Education 2016 [employer] Inc[employer]  

## 2016-07-31 NOTE — Progress Notes (Signed)
Patient ID: Joy Carpenter, female   DOB: 02/11/1951, 65 y.o.   MRN: [id number]  Chief Complaint  Patient presents with  . New Evaluation    Leg tingling and weakness  . AAA  . Aneurysm    HPI Joy Carpenter is a 65 y.o. female.  I am asked to see the patient by Dr. Stephanie Coup for evaluation of peripheral vascular disease as well as an abdominal aortic aneurysm.  The patient reports vague epigastric and abdominal pain which has been difficult to diagnose. She has had multiple studies and continues to have this evaluated. As part of her workup a CT scan was performed which I have independently reviewed. This does demonstrate an approximately 4 cm abdominal aortic aneurysm. The patient also complains of significant claudication symptoms. She describes weakness and tiredness in her legs with short distances of walking. This has been steadily progressing over several months. There is no clear inciting event or causative factor that started the symptoms. She does not have a history of ulceration or infection.   Past Medical History:  Diagnosis Date  . Aortic aneurysm ([place])   . Emphysema of lung ([place])    "mild"  . Headache    daily - stress  . Vertigo    1 episode - approx 2010   . Weakness of both legs    intermittent    Past Surgical History:  Procedure Laterality Date  . COLONOSCOPY    . COLONOSCOPY WITH PROPOFOL N/A 06/14/2016   Procedure: COLONOSCOPY WITH PROPOFOL;  Surgeon: Lucilla Lame, MD;  Location: [facility];  Service: Endoscopy;  Laterality: N/A;  . ESOPHAGOGASTRODUODENOSCOPY (EGD) WITH PROPOFOL N/A 06/14/2016   Procedure: ESOPHAGOGASTRODUODENOSCOPY (EGD) WITH PROPOFOL;  Surgeon: Lucilla Lame, MD;  Location: [facility];  Service: Endoscopy;  Laterality: N/A;  . TUBAL LIGATION      Family History No family history of bleeding disorders, clotting disorders, autoimmune diseases, or aneurysms  Social History Social History  Substance Use Topics  .  Smoking status: Current Every Day Smoker    Packs/day: 1.00    Years: 30.00    Types: Cigarettes  . Smokeless tobacco: Never Used  . Alcohol use No   No IV drug use  No Known Allergies  Current Outpatient Prescriptions  Medication Sig Dispense Refill  . ALPRAZolam (XANAX) 0.25 MG tablet Take 0.25 mg by mouth daily as needed. for anxiety  3  . aspirin EC 81 MG tablet Take 81 mg by mouth daily.    Marland Kitchen levothyroxine (SYNTHROID, LEVOTHROID) 50 MCG tablet ONE TAB BY MOUTH IN THE MORNING 30 MINUTES PRIOR TO FOOD.  1  . pantoprazole (PROTONIX) 40 MG tablet Take 40 mg by mouth 2 (two) times daily.  2  . polyethylene glycol powder (GLYCOLAX/MIRALAX) powder AS DIRECTED FOR COLONIC PREP.  0  . simvastatin (ZOCOR) 20 MG tablet Take 20 mg by mouth daily.  1  . valACYclovir (VALTREX) 500 MG tablet TAKE 1 TABLET BY MOUTH EVERY DAY FOR OUTBREAK AS NEEDED  5  . zolpidem (AMBIEN) 10 MG tablet Take 10 mg by mouth at bedtime.  3   No current facility-administered medications for this visit.       REVIEW OF SYSTEMS (Negative unless checked)  Constitutional: [] Weight loss  [] Fever  [] Chills Cardiac: [] Chest pain   [] Chest pressure   [] Palpitations   [] Shortness of breath when laying flat   [] Shortness of breath at rest   [] Shortness of breath with exertion. Vascular:  [  x]Pain in legs with walking   [] Pain in legs at rest   [] Pain in legs when laying flat   [x] Claudication   [] Pain in feet when walking  [] Pain in feet at rest  [] Pain in feet when laying flat   [] History of DVT   [] Phlebitis   [] Swelling in legs   [] Varicose veins   [] Non-healing ulcers Pulmonary:   [] Uses home oxygen   [] Productive cough   [] Hemoptysis   [] Wheeze  [] COPD   [] Asthma Neurologic:  [] Dizziness  [] Blackouts   [] Seizures   [] History of stroke   [] History of TIA  [] Aphasia   [] Temporary blindness   [] Dysphagia   [] Weakness or numbness in arms   [] Weakness or numbness in legs Musculoskeletal:  [] Arthritis   [] Joint swelling    [] Joint pain   [] Low back pain Hematologic:  [] Easy bruising  [] Easy bleeding   [] Hypercoagulable state   [] Anemic  [] Hepatitis Gastrointestinal:  [] Blood in stool   [] Vomiting blood  [] Gastroesophageal reflux/heartburn   [x] Abdominal pain Genitourinary:  [] Chronic kidney disease   [] Difficult urination  [] Frequent urination  [] Burning with urination   [] Hematuria Skin:  [] Rashes   [] Ulcers   [] Wounds Psychological:  [] History of anxiety   []  History of major depression.    Physical Exam BP 119/69 (BP Location: Right Arm)   Pulse 72   Resp 16   Ht 5\' 6"  (1.676 m)   Wt 134 lb (60.8 kg)   BMI 21.63 kg/m  Gen:  WD/WN, NAD. Appears younger than stated age Head: [place]/AT, No temporalis wasting. Prominent temp pulse not noted. Ear/Nose/Throat: Hearing grossly intact, nares w/o erythema or drainage, oropharynx w/o Erythema/Exudate Eyes: Conjunctiva clear, sclera non-icteric  Neck: trachea midline.  No bruit or JVD.  Pulmonary:  Good air movement, clear to auscultation bilaterally.  Cardiac: RRR, normal S1, S2, no Murmurs, rubs or gallops. Vascular: Aortic impulse is enlarged Vessel Right Left  Radial Palpable Palpable  Ulnar Palpable Palpable  Brachial Palpable Palpable  Carotid Palpable, without bruit Palpable, without bruit  Aorta  palpable N/A  Femoral Palpable Palpable  Popliteal Palpable Palpable  PT Palpable 1+ Palpable  DP Trace Palpable Palpable   Gastrointestinal: soft, non-tender/non-distended. No guarding/reflex. No masses, surgical incisions, or scars. Musculoskeletal: M/S 5/5 throughout.  Extremities without ischemic changes.  No deformity or atrophy. no edema. Neurologic: Sensation grossly intact in extremities.  Symmetrical.  Speech is fluent. Motor exam as listed above. Psychiatric: Judgment intact, Mood & affect appropriate for pt's clinical situation. Dermatologic: No rashes or ulcers noted.  No cellulitis or open wounds. Lymph : No Cervical, Axillary, or Inguinal  lymphadenopathy.   Radiology No results found.  Labs No results found for this or any previous visit (from the past 2160 hour(s)).  Assessment/Plan:  Abdominal pain, epigastric Unclear cause.  Work up ongoing.  Unlikely vascular in origin  Tobacco use disorder We discussed the absolute need for smoking cessation due to the deleterious nature of tobacco on the vascular system. We discussed the tobacco use would diminish patency of any intervention, and likely significantly worsen progressio of disease. We discussed multiple agents for quitting including replacement therapy or medications to reduce cravings such as Chantix. The patient voices their understanding of the importance of smoking cessation.   Hyperlipidemia lipid control important in reducing the progression of atherosclerotic disease. Continue statin therapy   Atherosclerosis of native arteries of extremity with intermittent claudication [facility]) The patient has noninvasive studies performed by her primary care physician which demonstrated ABIs of  approximately 0.9 bilaterally with duplex suggesting more distal disease on ultrasound. Her symptoms are mild to moderate this point and not lifestyle limiting. She continues to work. I have discussed with her appropriate medical therapy with aspirin and statin agent which is already being used. I have strongly recommended tobacco cessation as well as increasing her exercise regimen. No intervention will currently be performed, but I will plan a follow-up visit and we are checking her aneurysm in 4-6 months with follow-up noninvasive studies.  AAA (abdominal aortic aneurysm) without rupture ([place]) I have independently reviewed the patient's CT scan from a couple of months ago. 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 The maximal diameter is about 4 cm. There  does not appear to be significant iliac artery occlusive disease present. I discussed the natural history and pathophysiology of abdominal aortic aneurysms. At this size, rupture would be far less than 1% and repair is not indicated. I would recommend a follow-up ultrasound in 4-6 months for continued surveillance and evaluation. I discussed the importance of tobacco cessation and blood pressure control      Leotis Pain 07/31/2016, 3:57 PM   This note was created with Dragon medical transcription system.  Any errors from dictation are unintentional.

## 2016-09-03 ENCOUNTER — Encounter: Admission: RE | Payer: Self-pay | Source: Ambulatory Visit

## 2016-09-03 ENCOUNTER — Ambulatory Visit
Admission: RE | Admit: 2016-09-03 | Payer: BLUE CROSS/BLUE SHIELD | Source: Ambulatory Visit | Admitting: Gastroenterology

## 2016-09-03 SURGERY — COLONOSCOPY WITH PROPOFOL
Anesthesia: General

## 2016-11-14 ENCOUNTER — Ambulatory Visit (INDEPENDENT_AMBULATORY_CARE_PROVIDER_SITE_OTHER): Payer: BLUE CROSS/BLUE SHIELD | Admitting: Vascular Surgery

## 2016-11-14 ENCOUNTER — Ambulatory Visit (INDEPENDENT_AMBULATORY_CARE_PROVIDER_SITE_OTHER): Payer: BLUE CROSS/BLUE SHIELD

## 2016-11-14 DIAGNOSIS — I70213 Atherosclerosis of native arteries of extremities with intermittent claudication, bilateral legs: Secondary | ICD-10-CM

## 2016-11-14 DIAGNOSIS — I714 Abdominal aortic aneurysm, without rupture, unspecified: Secondary | ICD-10-CM

## 2016-11-22 ENCOUNTER — Encounter (INDEPENDENT_AMBULATORY_CARE_PROVIDER_SITE_OTHER): Payer: Self-pay | Admitting: Vascular Surgery

## 2017-02-11 ENCOUNTER — Other Ambulatory Visit (INDEPENDENT_AMBULATORY_CARE_PROVIDER_SITE_OTHER): Payer: Self-pay | Admitting: Vascular Surgery

## 2017-02-11 DIAGNOSIS — I714 Abdominal aortic aneurysm, without rupture, unspecified: Secondary | ICD-10-CM

## 2017-02-12 ENCOUNTER — Encounter (INDEPENDENT_AMBULATORY_CARE_PROVIDER_SITE_OTHER): Payer: Self-pay | Admitting: Vascular Surgery

## 2017-02-12 ENCOUNTER — Ambulatory Visit (INDEPENDENT_AMBULATORY_CARE_PROVIDER_SITE_OTHER): Payer: BLUE CROSS/BLUE SHIELD | Admitting: Vascular Surgery

## 2017-02-12 ENCOUNTER — Ambulatory Visit (INDEPENDENT_AMBULATORY_CARE_PROVIDER_SITE_OTHER): Payer: BLUE CROSS/BLUE SHIELD

## 2017-02-12 VITALS — BP 128/81 | HR 59 | Resp 16 | Wt 139.0 lb

## 2017-02-12 DIAGNOSIS — I714 Abdominal aortic aneurysm, without rupture, unspecified: Secondary | ICD-10-CM

## 2017-02-12 DIAGNOSIS — F17200 Nicotine dependence, unspecified, uncomplicated: Secondary | ICD-10-CM | POA: Diagnosis not present

## 2017-02-12 DIAGNOSIS — E785 Hyperlipidemia, unspecified: Secondary | ICD-10-CM | POA: Diagnosis not present

## 2017-02-12 NOTE — Assessment & Plan Note (Signed)
lipid control important in reducing the progression of atherosclerotic disease. Continue statin therapy  

## 2017-02-12 NOTE — Progress Notes (Signed)
MRN : [id number]  Joy Carpenter is a 66 y.o. (1951-03-20) female who presents with chief complaint of  Chief Complaint  Patient presents with  . Follow-up  .  History of Present Illness: Patient returns today in follow up of AAA. She continues to have some vague abdominal pain but no new tearing back or abdominal pain or signs of peripheral embolization. She denies fever or chills. She continues to smoke. Her aortic duplex today shows basically a stable size of her abdominal aortic aneurysm measuring 4.57 cm in maximal diameter. This was 4.56 cm 3 months ago.  Current Outpatient Prescriptions  Medication Sig Dispense Refill  . ALPRAZolam (XANAX) 0.25 MG tablet Take 0.25 mg by mouth daily as needed. for anxiety  3  . aspirin EC 81 MG tablet Take 81 mg by mouth daily.    Marland Kitchen levothyroxine (SYNTHROID, LEVOTHROID) 50 MCG tablet ONE TAB BY MOUTH IN THE MORNING 30 MINUTES PRIOR TO FOOD.  1  . pantoprazole (PROTONIX) 40 MG tablet Take 40 mg by mouth 2 (two) times daily.  2  . polyethylene glycol powder (GLYCOLAX/MIRALAX) powder AS DIRECTED FOR COLONIC PREP.  0  . simvastatin (ZOCOR) 20 MG tablet Take 20 mg by mouth daily.  1  . valACYclovir (VALTREX) 500 MG tablet TAKE 1 TABLET BY MOUTH EVERY DAY FOR OUTBREAK AS NEEDED  5  . zolpidem (AMBIEN) 10 MG tablet Take 10 mg by mouth at bedtime.  3   No current facility-administered medications for this visit.     Past Medical History:  Diagnosis Date  . Aortic aneurysm ([place])   . Emphysema of lung ([place])    "mild"  . Headache    daily - stress  . Vertigo    1 episode - approx 2010   . Weakness of both legs    intermittent    Past Surgical History:  Procedure Laterality Date  . COLONOSCOPY    . COLONOSCOPY WITH PROPOFOL N/A 06/14/2016   Procedure: COLONOSCOPY WITH PROPOFOL;  Surgeon: Lucilla Lame, MD;  Location: [facility];  Service: Endoscopy;  Laterality: N/A;  . ESOPHAGOGASTRODUODENOSCOPY (EGD) WITH PROPOFOL N/A 06/14/2016   Procedure: ESOPHAGOGASTRODUODENOSCOPY (EGD) WITH PROPOFOL;  Surgeon: Lucilla Lame, MD;  Location: [facility];  Service: Endoscopy;  Laterality: N/A;  . TUBAL LIGATION      Social History Social History  Substance Use Topics  . Smoking status: Current Every Day Smoker    Packs/day: 1.00    Years: 30.00    Types: Cigarettes  . Smokeless tobacco: Never Used  . Alcohol use No     Family History Half brother with an aneurysms No bleeding or clotting disorders  No Known Allergies   REVIEW OF SYSTEMS (Negative unless checked)  Constitutional: [] Weight loss  [] Fever  [] Chills Cardiac: [] Chest pain   [] Chest pressure   [] Palpitations   [] Shortness of breath when laying flat   [] Shortness of breath at rest   [] Shortness of breath with exertion. Vascular:  [] Pain in legs with walking   [] Pain in legs at rest   [] Pain in legs when laying flat   [] Claudication   [] Pain in feet when walking  [] Pain in feet at rest  [] Pain in feet when laying flat   [] History of DVT   [] Phlebitis   [] Swelling in legs   [] Varicose veins   [] Non-healing ulcers Pulmonary:   [] Uses home oxygen   [] Productive cough   [] Hemoptysis   [] Wheeze  [] COPD   [] Asthma Neurologic:  []   Dizziness  [] Blackouts   [] Seizures   [] History of stroke   [] History of TIA  [] Aphasia   [] Temporary blindness   [] Dysphagia   [] Weakness or numbness in arms   [] Weakness or numbness in legs Musculoskeletal:  [x] Arthritis   [] Joint swelling   [] Joint pain   [] Low back pain Hematologic:  [] Easy bruising  [] Easy bleeding   [] Hypercoagulable state   [] Anemic   Gastrointestinal:  [] Blood in stool   [] Vomiting blood  [x] Gastroesophageal reflux/heartburn   [x] Abdominal pain Genitourinary:  [] Chronic kidney disease   [] Difficult urination  [] Frequent urination  [] Burning with urination   [] Hematuria Skin:  [] Rashes   [] Ulcers   [] Wounds Psychological:  [] History of anxiety   []  History of major depression.  Physical Examination  BP 128/81    Pulse (!) 59   Resp 16   Wt 139 lb (63 kg)   BMI 22.44 kg/m  Gen:  WD/WN, NAD. Appears younger than stated age. Head: [place]/AT, No temporalis wasting. Ear/Nose/Throat: Hearing grossly intact, nares w/o erythema or drainage, trachea midline Eyes: Conjunctiva clear. Sclera non-icteric Neck: Supple.  No JVD.  Pulmonary:  Good air movement, no use of accessory muscles.  Cardiac: RRR, normal S1, S2 Vascular:  Vessel Right Left  Radial Palpable Palpable  Ulnar Palpable Palpable  Brachial Palpable Palpable  Carotid Palpable, without bruit Palpable, without bruit  Aorta Not palpable N/A  Femoral Palpable Palpable  Popliteal Palpable Palpable  PT Palpable Palpable  DP Palpable Palpable   Gastrointestinal: soft, non-tender/non-distended. Increased aortic impulse Musculoskeletal: M/S 5/5 throughout.  No deformity or atrophy. Neurologic: Sensation grossly intact in extremities.  Symmetrical.  Speech is fluent.  Psychiatric: Judgment intact, Mood & affect appropriate for pt's clinical situation. Dermatologic: No rashes or ulcers noted.  No cellulitis or open wounds.       Labs No results found for this or any previous visit (from the past 2160 hour(s)).  Radiology No results found.   Assessment/Plan  Hyperlipidemia lipid control important in reducing the progression of atherosclerotic disease. Continue statin therapy   Tobacco use disorder We had a discussion for approximately 3-4 minutes regarding the absolute need for smoking cessation due to the deleterious nature of tobacco on the vascular system, particularly with aneurysm growth in her case. We discussed the tobacco use would diminish patency of any intervention, and likely significantly worsen progressio of disease. We discussed multiple agents for quitting including replacement therapy or medications to reduce cravings such as Chantix. The patient voices their understanding of the importance of smoking cessation.   AAA  (abdominal aortic aneurysm) without rupture ([facility]) Her aortic duplex today shows basically a stable size of her abdominal aortic aneurysm measuring 4.57 cm in maximal diameter. This was 4.56 cm 3 months ago. This is below the threshold for prophylactic repair. We had a long discussion today regarding the pathophysiology and natural history of aneurysms. Smoking cessation was again advised. She will return in 6 months with a duplex for follow-up or sooner if problems develop in the interim.    Leotis Pain, MD  02/12/2017 9:18 AM    This note was created with Dragon medical transcription system.  Any errors from dictation are purely unintentional

## 2017-02-12 NOTE — Assessment & Plan Note (Signed)
We had a discussion for approximately 3-4 minutes regarding the absolute need for smoking cessation due to the deleterious nature of tobacco on the vascular system, particularly with aneurysm growth in her case. We discussed the tobacco use would diminish patency of any intervention, and likely significantly worsen progressio of disease. We discussed multiple agents for quitting including replacement therapy or medications to reduce cravings such as Chantix. The patient voices their understanding of the importance of smoking cessation.

## 2017-02-12 NOTE — Assessment & Plan Note (Signed)
Her aortic duplex today shows basically a stable size of her abdominal aortic aneurysm measuring 4.57 cm in maximal diameter. This was 4.56 cm 3 months ago. This is below the threshold for prophylactic repair. We had a long discussion today regarding the pathophysiology and natural history of aneurysms. Smoking cessation was again advised. She will return in 6 months with a duplex for follow-up or sooner if problems develop in the interim.

## 2017-04-18 ENCOUNTER — Other Ambulatory Visit: Payer: Self-pay | Admitting: Nurse Practitioner

## 2017-04-18 DIAGNOSIS — R911 Solitary pulmonary nodule: Secondary | ICD-10-CM

## 2017-04-29 ENCOUNTER — Ambulatory Visit: Payer: BLUE CROSS/BLUE SHIELD

## 2017-05-01 ENCOUNTER — Ambulatory Visit
Admission: RE | Admit: 2017-05-01 | Discharge: 2017-05-01 | Disposition: A | Discharge status: Home / Self Care | Payer: BLUE CROSS/BLUE SHIELD | Source: Ambulatory Visit | Attending: Nurse Practitioner | Admitting: Nurse Practitioner

## 2017-05-01 ENCOUNTER — Ambulatory Visit (HOSPITAL_COMMUNITY): Payer: BLUE CROSS/BLUE SHIELD

## 2017-05-01 DIAGNOSIS — I700 Atherosclerosis of aorta: Secondary | ICD-10-CM | POA: Insufficient documentation

## 2017-05-01 DIAGNOSIS — R911 Solitary pulmonary nodule: Secondary | ICD-10-CM

## 2017-05-01 DIAGNOSIS — I719 Aortic aneurysm of unspecified site, without rupture: Secondary | ICD-10-CM | POA: Insufficient documentation

## 2017-05-01 DIAGNOSIS — J439 Emphysema, unspecified: Secondary | ICD-10-CM | POA: Insufficient documentation

## 2017-08-21 ENCOUNTER — Ambulatory Visit (INDEPENDENT_AMBULATORY_CARE_PROVIDER_SITE_OTHER): Payer: BLUE CROSS/BLUE SHIELD | Admitting: Vascular Surgery

## 2017-08-21 ENCOUNTER — Ambulatory Visit (INDEPENDENT_AMBULATORY_CARE_PROVIDER_SITE_OTHER): Payer: BLUE CROSS/BLUE SHIELD

## 2017-08-21 DIAGNOSIS — I714 Abdominal aortic aneurysm, without rupture, unspecified: Secondary | ICD-10-CM

## 2017-09-03 ENCOUNTER — Encounter (INDEPENDENT_AMBULATORY_CARE_PROVIDER_SITE_OTHER): Payer: Self-pay

## 2017-09-03 ENCOUNTER — Telehealth (INDEPENDENT_AMBULATORY_CARE_PROVIDER_SITE_OTHER): Payer: Self-pay | Admitting: Vascular Surgery

## 2017-09-03 NOTE — Telephone Encounter (Signed)
New Message  Pt verbalized she has been waiting for her results since her last visit, 08/21/2017, but no one has sent her anything in the mail or have called.

## 2017-09-04 NOTE — Telephone Encounter (Signed)
Called the patient back to let her know that Maudie Mercury states that at this time her AAA is at a 4x4cm which is stable and that she needs to continue to have her AAA checked every six months.

## 2017-09-04 NOTE — Telephone Encounter (Signed)
The patients AAA is 4.0cm x 4.0cm - which is stable. At this size, she should continue to under AAA duplex every six months.

## 2017-10-08 DIAGNOSIS — G5600 Carpal tunnel syndrome, unspecified upper limb: Secondary | ICD-10-CM

## 2017-10-08 HISTORY — DX: Carpal tunnel syndrome, unspecified upper limb: G56.00

## 2017-12-16 ENCOUNTER — Encounter: Payer: Self-pay | Admitting: Nurse Practitioner

## 2017-12-16 ENCOUNTER — Ambulatory Visit: Payer: BLUE CROSS/BLUE SHIELD | Admitting: Nurse Practitioner

## 2017-12-16 VITALS — BP 140/80 | HR 75 | Resp 16 | Ht 67.0 in | Wt 142.0 lb

## 2017-12-16 DIAGNOSIS — R109 Unspecified abdominal pain: Secondary | ICD-10-CM | POA: Diagnosis not present

## 2017-12-16 DIAGNOSIS — F5101 Primary insomnia: Secondary | ICD-10-CM | POA: Diagnosis not present

## 2017-12-16 DIAGNOSIS — F411 Generalized anxiety disorder: Secondary | ICD-10-CM | POA: Diagnosis not present

## 2017-12-16 DIAGNOSIS — I714 Abdominal aortic aneurysm, without rupture, unspecified: Secondary | ICD-10-CM

## 2017-12-16 MED ORDER — ZOLPIDEM TARTRATE 10 MG PO TABS: 10.0000 mg | ORAL_TABLET | Freq: Every day | ORAL | 3 refills | Status: DC

## 2017-12-16 MED ORDER — ALPRAZOLAM 0.25 MG PO TABS: 0.2500 mg | ORAL_TABLET | Freq: Every day | ORAL | 3 refills | Status: DC | PRN

## 2017-12-16 NOTE — Progress Notes (Signed)
[facility] [place], [place] [address]  Internal MEDICINE  Office Visit Note  Patient Name: Joy Carpenter  [id number]  [id number]  Date of Service: 01/04/2018  No chief complaint on file.   Patient is here for routine follow up exam. Today, she is c/o left abdominal pain, about 1 inch below the lower rib cage. Has had this for some time. Was told she did have hernia. Was diagnosed with abdominal aortic anyeurism at the same time. Primary focus has been on the aneurism. This has been stable. Pain in left abdomen now getting worse. Now feeling like belly is getting bloated and "blowing up" much more often. This is worse when she is lifting and exerting herself .Food feels like it's getting stuck at the end of esophagus. However, eating does not seem to make the bloating and abdominal pain any worse     Pt is here for routine follow up.    Current Medication: Outpatient Encounter Medications as of 12/16/2017  Medication Sig Note  . ALPRAZolam (XANAX) 0.25 MG tablet Take 1 tablet (0.25 mg total) by mouth daily as needed for anxiety. for anxiety   . aspirin EC 81 MG tablet Take 81 mg by mouth daily.   Marland Kitchen levothyroxine (SYNTHROID, LEVOTHROID) 50 MCG tablet ONE TAB BY MOUTH IN THE MORNING 30 MINUTES PRIOR TO FOOD. 06/12/2016: Received from: External Pharmacy  . pantoprazole (PROTONIX) 40 MG tablet Take 40 mg by mouth 2 (two) times daily. 06/08/2016: Received from: External Pharmacy Received Sig: TAKE 1 TABLET BY MOUTH TWICE A DAY  . polyethylene glycol powder (GLYCOLAX/MIRALAX) powder AS DIRECTED FOR COLONIC PREP. 06/08/2016: Received from: External Pharmacy  . simvastatin (ZOCOR) 20 MG tablet Take 20 mg by mouth daily. 06/12/2016: Received from: External Pharmacy Received Sig: TAKE 1 TABLET BY MOUTH EVERY DAY  . traMADol-acetaminophen (ULTRACET) 37.5-325 MG tablet Take 1 tablet by mouth 2 (two) times daily as needed. for pain   . valACYclovir (VALTREX) 500 MG tablet TAKE 1  TABLET BY MOUTH EVERY DAY FOR OUTBREAK AS NEEDED 06/14/2016: PRN, last used 5-6 months ago   . zolpidem (AMBIEN) 10 MG tablet Take 1 tablet (10 mg total) by mouth at bedtime.   . [DISCONTINUED] ALPRAZolam (XANAX) 0.25 MG tablet Take 0.25 mg by mouth daily as needed. for anxiety 06/08/2016: Received from: External Pharmacy Received Sig: TAKE 1 TABLET BY MOUTH DAILY AS NEEDED FOR ANXIETY  . [DISCONTINUED] zolpidem (AMBIEN) 10 MG tablet Take 10 mg by mouth at bedtime. 06/08/2016: Received from: External Pharmacy Received Sig: TAKE 1 TABLET BY MOUTH AT BEDTIME   No facility-administered encounter medications on file as of 12/16/2017.     Surgical History: Past Surgical History:  Procedure Laterality Date  . COLONOSCOPY    . COLONOSCOPY WITH PROPOFOL N/A 06/14/2016   Procedure: COLONOSCOPY WITH PROPOFOL;  Surgeon: Lucilla Lame, MD;  Location: [place];  Service: Endoscopy;  Laterality: N/A;  . ESOPHAGOGASTRODUODENOSCOPY (EGD) WITH PROPOFOL N/A 06/14/2016   Procedure: ESOPHAGOGASTRODUODENOSCOPY (EGD) WITH PROPOFOL;  Surgeon: Lucilla Lame, MD;  Location: [place];  Service: Endoscopy;  Laterality: N/A;  . TUBAL LIGATION      Medical History: Past Medical History:  Diagnosis Date  . Anxiety   . Aortic aneurysm ([place])   . Depression   . Emphysema of lung ([place])    "mild"  . GERD (gastroesophageal reflux disease)   . Headache    daily - stress  . Herpes   . Hyperlipidemia   .  Insomnia   . Osteoporosis   . Vertigo    1 episode - approx 2010   . Weakness of both legs    intermittent    Family History: Family History  Problem Relation Age of Onset  . Leukemia Mother   . Aneurysm Other     Social History   Socioeconomic History  . Marital status: Divorced    Spouse name: Not on file  . Number of children: Not on file  . Years of education: Not on file  . Highest education level: Not on file  Occupational History  . Not on file  Social Needs  . Financial resource  strain: Not on file  . Food insecurity:    Worry: Not on file    Inability: Not on file  . Transportation needs:    Medical: Not on file    Non-medical: Not on file  Tobacco Use  . Smoking status: Current Every Day Smoker    Packs/day: 1.00    Years: 30.00    Pack years: 30.00    Types: Cigarettes  . Smokeless tobacco: Never Used  Substance and Sexual Activity  . Alcohol use: No  . Drug use: Not on file  . Sexual activity: Not on file  Lifestyle  . Physical activity:    Days per week: Not on file    Minutes per session: Not on file  . Stress: Not on file  Relationships  . Social connections:    Talks on phone: Not on file    Gets together: Not on file    Attends religious service: Not on file    Active member of club or organization: Not on file    Attends meetings of clubs or organizations: Not on file    Relationship status: Not on file  . Intimate partner violence:    Fear of current or ex partner: Not on file    Emotionally abused: Not on file    Physically abused: Not on file    Forced sexual activity: Not on file  Other Topics Concern  . Not on file  Social History Narrative  . Not on file      Review of Systems  Constitutional: Negative for activity change, chills, fatigue and unexpected weight change.  HENT: Negative for congestion, postnasal drip, rhinorrhea, sneezing and sore throat.   Eyes: Negative.  Negative for redness.  Respiratory: Negative for cough, chest tightness, shortness of breath and wheezing.   Cardiovascular: Negative for chest pain and palpitations.  Gastrointestinal: Positive for abdominal distention, abdominal pain, constipation and nausea. Negative for diarrhea and vomiting.  Endocrine:       Well controlled thyroid disease  Genitourinary: Negative.  Negative for dysuria and frequency.  Musculoskeletal: Negative for arthralgias, back pain, joint swelling and neck pain.  Skin: Negative for rash.  Neurological: Negative.  Negative  for tremors and numbness.  Hematological: Negative for adenopathy. Does not bruise/bleed easily.  Psychiatric/Behavioral: Negative for behavioral problems (Depression), sleep disturbance and suicidal ideas. The patient is nervous/anxious.     Today's Vitals   12/16/17 1151  BP: 140/80  Pulse: 75  Resp: 16  SpO2: 95%  Weight: 142 lb (64.4 kg)  Height: 5\' 7"  (1.702 m)    Physical Exam  Constitutional: She is oriented to person, place, and time. She appears well-developed and well-nourished. No distress.  HENT:  Head: Normocephalic and atraumatic.  Mouth/Throat: Oropharynx is clear and moist. No oropharyngeal exudate.  Eyes: Pupils are equal, round, and reactive  to light. EOM are normal.  Neck: Normal range of motion. Neck supple. No JVD present. Carotid bruit is not present. No tracheal deviation present. No thyromegaly present.  Cardiovascular: Normal rate, regular rhythm and normal heart sounds. Exam reveals no gallop and no friction rub.  No murmur heard. Pulmonary/Chest: Effort normal. No respiratory distress. She has no wheezes. She has no rales. She exhibits no tenderness.  Abdominal: Soft. Bowel sounds are normal. She exhibits distension. There is tenderness.  Musculoskeletal: Normal range of motion.  Lymphadenopathy:    She has no cervical adenopathy.  Neurological: She is alert and oriented to person, place, and time. No cranial nerve deficit.  Skin: Skin is warm and dry. She is not diaphoretic.  Psychiatric: She has a normal mood and affect. Her behavior is normal. Judgment and thought content normal.  Nursing note and vitals reviewed.  Assessment/Plan: 1. Left sided abdominal pain Persistent abdominal pain. Will get ultrasound of abdomen and pelvis for further evaluation.  - US Abdomen Complete; Future - US Pelvis Complete; Future  2. AAA (abdominal aortic aneurysm) without rupture ([place]) Continue regular visits with vein and vascular for regular monitoring.   3.  Primary insomnia - zolpidem (AMBIEN) 10 MG tablet; Take 1 tablet (10 mg total) by mouth at bedtime.  Dispense: 30 tablet; Refill: 3  4. Generalized anxiety disorder - ALPRAZolam (XANAX) 0.25 MG tablet; Take 1 tablet (0.25 mg total) by mouth daily as needed for anxiety. for anxiety  Dispense: 30 tablet; Refill: 3  General Counseling: aylissa heinemann understanding of the findings of todays visit and agrees with plan of treatment. I have discussed any further diagnostic evaluation that may be needed or ordered today. We also reviewed her medications today. she has been encouraged to call the office with any questions or concerns that should arise related to todays visit.  This patient was seen by Leretha Pol, FNP- C in Collaboration with Dr Lavera Guise as a part of collaborative care agreement    Orders Placed This Encounter  Procedures  . US Abdomen Complete  . US Pelvis Complete    Meds ordered this encounter  Medications  . ALPRAZolam (XANAX) 0.25 MG tablet    Sig: Take 1 tablet (0.25 mg total) by mouth daily as needed for anxiety. for anxiety    Dispense:  30 tablet    Refill:  3    Order Specific Question:   Supervising Provider    Answer:   Lavera Guise [[id number]]  . zolpidem (AMBIEN) 10 MG tablet    Sig: Take 1 tablet (10 mg total) by mouth at bedtime.    Dispense:  30 tablet    Refill:  3    Order Specific Question:   Supervising Provider    Answer:   Lavera Guise [[id number]]    Time spent: 34 Minutes    Dr Lavera Guise Internal medicine

## 2018-01-04 ENCOUNTER — Encounter: Payer: Self-pay | Admitting: Nurse Practitioner

## 2018-01-04 DIAGNOSIS — F411 Generalized anxiety disorder: Secondary | ICD-10-CM | POA: Insufficient documentation

## 2018-01-04 DIAGNOSIS — R109 Unspecified abdominal pain: Secondary | ICD-10-CM | POA: Insufficient documentation

## 2018-01-04 DIAGNOSIS — F5101 Primary insomnia: Secondary | ICD-10-CM | POA: Insufficient documentation

## 2018-01-10 ENCOUNTER — Other Ambulatory Visit: Payer: BLUE CROSS/BLUE SHIELD

## 2018-01-10 ENCOUNTER — Ambulatory Visit (INDEPENDENT_AMBULATORY_CARE_PROVIDER_SITE_OTHER): Payer: BLUE CROSS/BLUE SHIELD

## 2018-01-10 DIAGNOSIS — R109 Unspecified abdominal pain: Secondary | ICD-10-CM

## 2018-01-15 ENCOUNTER — Telehealth: Payer: Self-pay

## 2018-01-15 NOTE — Telephone Encounter (Signed)
[facility] has been advised that the patient's aortic aneurysm has grown in size ( faxed copy to office) they advised me that they will put the patient on a cancellation list to move her appt up sooner than 02/18/18 @ 7:30. Patient has been advised as well.titania

## 2018-01-16 ENCOUNTER — Telehealth: Payer: Self-pay | Admitting: Nurse Practitioner

## 2018-01-16 ENCOUNTER — Telehealth: Payer: Self-pay

## 2018-01-16 NOTE — Telephone Encounter (Signed)
Patient has been advised there was no evidence of hernia seen or reported on either ultrasound. 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 Measures about 2.6cm in diameter and there is plaque throughout the aorta. There was also some gallbladder sludge noted but no stones or other abnormalities. Joy Carpenter

## 2018-01-16 NOTE — Telephone Encounter (Signed)
There was no evidence of hernia seen or reported on either ultrasound. 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 Measures about 2.6cm in diameter and there is plaque throughout the aorta. There was also some gallbladder sludge noted but no stones or other abnormalities.

## 2018-01-31 ENCOUNTER — Ambulatory Visit (INDEPENDENT_AMBULATORY_CARE_PROVIDER_SITE_OTHER): Payer: BLUE CROSS/BLUE SHIELD | Admitting: Vascular Surgery

## 2018-01-31 ENCOUNTER — Other Ambulatory Visit (INDEPENDENT_AMBULATORY_CARE_PROVIDER_SITE_OTHER): Payer: Self-pay | Admitting: Vascular Surgery

## 2018-01-31 ENCOUNTER — Telehealth (INDEPENDENT_AMBULATORY_CARE_PROVIDER_SITE_OTHER): Payer: Self-pay | Admitting: Vascular Surgery

## 2018-01-31 ENCOUNTER — Other Ambulatory Visit (INDEPENDENT_AMBULATORY_CARE_PROVIDER_SITE_OTHER): Payer: BLUE CROSS/BLUE SHIELD

## 2018-01-31 ENCOUNTER — Encounter

## 2018-01-31 ENCOUNTER — Encounter (INDEPENDENT_AMBULATORY_CARE_PROVIDER_SITE_OTHER): Payer: Self-pay | Admitting: Vascular Surgery

## 2018-01-31 VITALS — BP 116/77 | HR 69 | Resp 16 | Ht 66.0 in | Wt 142.6 lb

## 2018-01-31 DIAGNOSIS — I713 Abdominal aortic aneurysm, ruptured, unspecified: Secondary | ICD-10-CM

## 2018-01-31 DIAGNOSIS — I714 Abdominal aortic aneurysm, without rupture, unspecified: Secondary | ICD-10-CM

## 2018-01-31 DIAGNOSIS — F17200 Nicotine dependence, unspecified, uncomplicated: Secondary | ICD-10-CM | POA: Diagnosis not present

## 2018-01-31 DIAGNOSIS — E785 Hyperlipidemia, unspecified: Secondary | ICD-10-CM

## 2018-01-31 NOTE — Patient Instructions (Signed)
Abdominal Aortic Aneurysm Blood pumps away from the heart through tubes (blood vessels) called arteries. Aneurysms are weak or damaged places in the wall of an artery. It bulges out like a balloon. An abdominal aortic aneurysm happens in the main artery of the body (aorta). It can burst or tear, causing bleeding inside the body. This is an emergency. It needs treatment right away. What are the causes? The exact cause is unknown. Things that could cause this problem include:  Fat and other substances building up in the lining of a tube.  Swelling of the walls of a blood vessel.  Certain tissue diseases.  Belly (abdominal) trauma.  An infection in the main artery of the body.  What increases the risk? There are things that make it more likely for you to have an aneurysm. These include:  Being over the age of 65 years old.  Having high blood pressure (hypertension).  Being a female.  Being white.  Being very overweight (obese).  Having a family history of aneurysm.  Using tobacco products.  What are the signs or symptoms? Symptoms depend on the size of the aneurysm and how fast it grows. There may not be symptoms. If symptoms occur, they can include:  Pain (belly, side, lower back, or groin).  Feeling full after eating a small amount of food.  Feeling sick to your stomach (nauseous), throwing up (vomiting), or both.  Feeling a lump in your belly that feels like it is beating (pulsating).  Feeling like you will pass out (faint).  How is this treated?  Medicine to control blood pressure and pain.  Imaging tests to see if the aneurysm gets bigger.  Surgery. How is this prevented? To lessen your chance of getting this condition:  Stop smoking. Stop chewing tobacco.  Limit or avoid alcohol.  Keep your blood pressure, blood sugar, and cholesterol within normal limits.  Eat less salt.  Eat foods low in saturated fats and cholesterol. These are found in animal and  whole dairy products.  Eat more fiber. Fiber is found in whole grains, vegetables, and fruits.  Keep a healthy weight.  Stay active and exercise often.  This information is not intended to replace advice given to you by your health care provider. Make sure you discuss any questions you have with your health care provider. Document Released: 01/19/2013 Document Revised: 03/01/2016 Document Reviewed: 10/24/2012 Elsevier Interactive Patient Education  2017 Elsevier Inc.  

## 2018-01-31 NOTE — Progress Notes (Signed)
MRN : [id number]  Joy Carpenter is a 67 y.o. (December 26, 1950) female who presents with chief complaint of  Chief Complaint  Patient presents with  . Follow-up    3month AAA  .  History of Present Illness: Patient returns today in follow up of her abdominal aortic aneurysm.  She continues to complain of significant left upper quadrant abdominal pain and lower abdominal pain.  This comes and goes and does not have a set rhyme or reason as to when it hurts.  Significant activity seems to make it worse.  She had an ultrasound performed a couple of weeks ago which suggest that her abdominal aortic aneurysm has grown a centimeter in size.  This measures a maximal diameter 5.5 cm as well as having an iliac artery diameter of 2.6 cm.  Her diameter is 4.5 cm 6 months ago.    Current Outpatient Medications  Medication Sig Dispense Refill  . ALPRAZolam (XANAX) 0.25 MG tablet Take 1 tablet (0.25 mg total) by mouth daily as needed for anxiety. for anxiety 30 tablet 3  . aspirin EC 81 MG tablet Take 81 mg by mouth daily.    Marland Kitchen levothyroxine (SYNTHROID, LEVOTHROID) 50 MCG tablet ONE TAB BY MOUTH IN THE MORNING 30 MINUTES PRIOR TO FOOD.  1  . pantoprazole (PROTONIX) 40 MG tablet Take 40 mg by mouth 2 (two) times daily.  2  . polyethylene glycol powder (GLYCOLAX/MIRALAX) powder AS DIRECTED FOR COLONIC PREP.  0  . simvastatin (ZOCOR) 20 MG tablet Take 20 mg by mouth daily.  1  . traMADol-acetaminophen (ULTRACET) 37.5-325 MG tablet Take 1 tablet by mouth 2 (two) times daily as needed. for pain  2  . valACYclovir (VALTREX) 500 MG tablet TAKE 1 TABLET BY MOUTH EVERY DAY FOR OUTBREAK AS NEEDED  5  . zolpidem (AMBIEN) 10 MG tablet Take 1 tablet (10 mg total) by mouth at bedtime. 30 tablet 3   No current facility-administered medications for this visit.     Past Medical History:  Diagnosis Date  . Anxiety   . Aortic aneurysm ([place])   . Depression   . Emphysema of lung ([place])    "mild"  . GERD  (gastroesophageal reflux disease)   . Headache    daily - stress  . Herpes   . Hyperlipidemia   . Insomnia   . Osteoporosis   . Vertigo    1 episode - approx 2010   . Weakness of both legs    intermittent    Past Surgical History:  Procedure Laterality Date  . COLONOSCOPY    . COLONOSCOPY WITH PROPOFOL N/A 06/14/2016   Procedure: COLONOSCOPY WITH PROPOFOL;  Surgeon: Lucilla Lame, MD;  Location: [place];  Service: Endoscopy;  Laterality: N/A;  . ESOPHAGOGASTRODUODENOSCOPY (EGD) WITH PROPOFOL N/A 06/14/2016   Procedure: ESOPHAGOGASTRODUODENOSCOPY (EGD) WITH PROPOFOL;  Surgeon: Lucilla Lame, MD;  Location: [facility];  Service: Endoscopy;  Laterality: N/A;  . TUBAL LIGATION      Social History      Social History  Substance Use Topics  . Smoking status: Current Every Day Smoker    Packs/day: 1.00    Years: 30.00    Types: Cigarettes  . Smokeless tobacco: Never Used  . Alcohol use No     Family History Half brother with an aneurysms No bleeding or clotting disorders No porphyria.  No autoimmune diseases  No Known Allergies   REVIEW OF SYSTEMS (Negative unless checked)  Constitutional: [] Weight loss  [] Fever  []   Chills Cardiac: [] Chest pain   [] Chest pressure   [] Palpitations   [] Shortness of breath when laying flat   [] Shortness of breath at rest   [] Shortness of breath with exertion. Vascular:  [] Pain in legs with walking   [] Pain in legs at rest   [] Pain in legs when laying flat   [] Claudication   [] Pain in feet when walking  [] Pain in feet at rest  [] Pain in feet when laying flat   [] History of DVT   [] Phlebitis   [] Swelling in legs   [] Varicose veins   [] Non-healing ulcers Pulmonary:   [] Uses home oxygen   [] Productive cough   [] Hemoptysis   [] Wheeze  [x] COPD   [] Asthma Neurologic:  [] Dizziness  [] Blackouts   [] Seizures   [] History of stroke   [] History of TIA  [] Aphasia   [] Temporary blindness   [] Dysphagia   [] Weakness or numbness in arms    [] Weakness or numbness in legs Musculoskeletal:  [x] Arthritis   [] Joint swelling   [] Joint pain   [] Low back pain Hematologic:  [] Easy bruising  [] Easy bleeding   [] Hypercoagulable state   [] Anemic   Gastrointestinal:  [] Blood in stool   [] Vomiting blood  [x] Gastroesophageal reflux/heartburn   [x] Abdominal pain Genitourinary:  [] Chronic kidney disease   [] Difficult urination  [] Frequent urination  [] Burning with urination   [] Hematuria Skin:  [] Rashes   [] Ulcers   [] Wounds Psychological:  [] History of anxiety   []  History of major depression.   Physical Examination  BP 116/77 (BP Location: Right Arm)   Pulse 69   Resp 16   Ht 5\' 6"  (1.676 m)   Wt 64.7 kg (142 lb 9.6 oz)   BMI 23.02 kg/m  Gen:  WD/WN, NAD.  Appears younger than stated age Head: [place]/AT, No temporalis wasting. Ear/Nose/Throat: Hearing grossly intact, nares w/o erythema or drainage Eyes: Conjunctiva clear. Sclera non-icteric Neck: Supple.  Trachea midline Pulmonary:  Good air movement, no use of accessory muscles.  Cardiac: RRR, no JVD Vascular:  Vessel Right Left  Radial Palpable Palpable                          PT Palpable Palpable  DP Palpable Palpable   Gastrointestinal: soft, non-tender/non-distended.  Increased aortic impulse Musculoskeletal: M/S 5/5 throughout.  No deformity or atrophy.  No significant lower extremity edema. Neurologic: Sensation grossly intact in extremities.  Symmetrical.  Speech is fluent.  Psychiatric: Judgment intact, Mood & affect appropriate for pt's clinical situation. Dermatologic: No rashes or ulcers noted.  No cellulitis or open wounds.       Labs No results found for this or any previous visit (from the past 2160 hour(s)).  Radiology No results found.  Assessment/Plan Hyperlipidemia lipid control important in reducing the progression of atherosclerotic disease. Continue statin therapy   Tobacco use disorder We had a discussion for approximately 3-4  minutes regarding the absolute need for smoking cessation due to the deleterious nature of tobacco on the vascular system, particularly with aneurysm growth in her case. We discussed the tobacco use would diminish patency of any intervention, and likely significantly worsen progressio of disease. We discussed multiple agents for quitting including replacement therapy or medications to reduce cravings such as Chantix. The patient voices their understanding of the importance of smoking cessation.    AAA (abdominal aortic aneurysm) without rupture ([place]) She had an ultrasound performed a couple of weeks ago which suggest that her abdominal aortic aneurysm has grown a centimeter in  size.  This measures a maximal diameter 5.5 cm as well as having an iliac artery diameter of 2.6 cm.  Her diameter is 4.5 cm 6 months ago.   This would be a somewhat dramatic growth and would clearly indicate the need for repair.  I have ordered a CT angiogram for further evaluation of this and to help plan repair.  I discussed the significant serious nature of the situation.  I am not sure her abdominal pain is related to her aneurysm, but with significant growth that is certainly a possibility.  This ultrasound was not done in our office and I discussed that ultrasound is not perfect for the evaluation of aneurysms.  I will see her back following the CT angiogram.  Smoking cessation strongly recommended.    Leotis Pain, MD  01/31/2018 9:07 AM    This note was created with Dragon medical transcription system.  Any errors from dictation are purely unintentional

## 2018-01-31 NOTE — Assessment & Plan Note (Signed)
She had an ultrasound performed a couple of weeks ago which suggest that her abdominal aortic aneurysm has grown a centimeter in size.  This measures a maximal diameter 5.5 cm as well as having an iliac artery diameter of 2.6 cm.  Her diameter is 4.5 cm 6 months ago.   This would be a somewhat dramatic growth and would clearly indicate the need for repair.  I have ordered a CT angiogram for further evaluation of this and to help plan repair.  I discussed the significant serious nature of the situation.  I am not sure her abdominal pain is related to her aneurysm, but with significant growth that is certainly a possibility.  This ultrasound was not done in our office and I discussed that ultrasound is not perfect for the evaluation of aneurysms.  I will see her back following the CT angiogram.  Smoking cessation strongly recommended.

## 2018-01-31 NOTE — Telephone Encounter (Signed)
Radiology will call the patient to schedule her CT and she needs to call to schedule a follow up with Dew.

## 2018-02-12 ENCOUNTER — Encounter (INDEPENDENT_AMBULATORY_CARE_PROVIDER_SITE_OTHER): Payer: Self-pay | Admitting: Internal Medicine

## 2018-02-12 ENCOUNTER — Telehealth (INDEPENDENT_AMBULATORY_CARE_PROVIDER_SITE_OTHER): Payer: Self-pay

## 2018-02-12 NOTE — Telephone Encounter (Signed)
Patient called to see if the order for CT scan was put in at the time of her last visit. She says that she has not heard anything, but the order is in the system.  I am going to call her to give her the phone number and the contact person of who to contact to get scheduled.

## 2018-02-13 ENCOUNTER — Telehealth: Payer: Self-pay

## 2018-02-13 NOTE — Telephone Encounter (Signed)
[facility] sent a refill request to [place] for alprazolam,but the rx was sent in on 12/16/17 for #30 and 3 refills.  I called [facility] and they are removing it from her system.

## 2018-02-18 ENCOUNTER — Ambulatory Visit (INDEPENDENT_AMBULATORY_CARE_PROVIDER_SITE_OTHER): Payer: BLUE CROSS/BLUE SHIELD | Admitting: Vascular Surgery

## 2018-02-18 ENCOUNTER — Other Ambulatory Visit (INDEPENDENT_AMBULATORY_CARE_PROVIDER_SITE_OTHER): Payer: BLUE CROSS/BLUE SHIELD

## 2018-03-04 ENCOUNTER — Telehealth (INDEPENDENT_AMBULATORY_CARE_PROVIDER_SITE_OTHER): Payer: Self-pay | Admitting: Vascular Surgery

## 2018-03-05 ENCOUNTER — Ambulatory Visit: Admission: RE | Admit: 2018-03-05 | Payer: BLUE CROSS/BLUE SHIELD | Source: Ambulatory Visit

## 2018-03-06 NOTE — Telephone Encounter (Signed)
Did you call the patient back to let her know what's going on and that she will need to be rescheduled? If you haven't, please call her back today. Thanks.

## 2018-03-10 ENCOUNTER — Ambulatory Visit
Admission: RE | Admit: 2018-03-10 | Discharge: 2018-03-10 | Disposition: A | Discharge status: Home / Self Care | Payer: BLUE CROSS/BLUE SHIELD | Source: Ambulatory Visit | Attending: Vascular Surgery | Admitting: Vascular Surgery

## 2018-03-10 DIAGNOSIS — I713 Abdominal aortic aneurysm, ruptured, unspecified: Secondary | ICD-10-CM

## 2018-03-10 DIAGNOSIS — I714 Abdominal aortic aneurysm, without rupture: Secondary | ICD-10-CM | POA: Diagnosis not present

## 2018-03-10 DIAGNOSIS — I700 Atherosclerosis of aorta: Secondary | ICD-10-CM | POA: Insufficient documentation

## 2018-03-10 MED ORDER — IOPAMIDOL (ISOVUE-370) INJECTION 76%
100.0000 mL | Freq: Once | INTRAVENOUS | Status: AC | PRN
  Administered 2018-03-10: 100 mL via INTRAVENOUS

## 2018-03-12 LAB — POCT I-STAT CREATININE: CREATININE: 0.9 mg/dL (ref 0.44–1.00)

## 2018-03-14 ENCOUNTER — Other Ambulatory Visit (INDEPENDENT_AMBULATORY_CARE_PROVIDER_SITE_OTHER): Payer: Self-pay | Admitting: Internal Medicine

## 2018-03-18 ENCOUNTER — Ambulatory Visit (INDEPENDENT_AMBULATORY_CARE_PROVIDER_SITE_OTHER): Payer: BLUE CROSS/BLUE SHIELD | Admitting: Vascular Surgery

## 2018-03-18 ENCOUNTER — Encounter (INDEPENDENT_AMBULATORY_CARE_PROVIDER_SITE_OTHER): Payer: Self-pay | Admitting: Vascular Surgery

## 2018-03-18 VITALS — BP 131/85 | HR 72 | Resp 17 | Ht 66.0 in | Wt 142.8 lb

## 2018-03-18 DIAGNOSIS — I714 Abdominal aortic aneurysm, without rupture, unspecified: Secondary | ICD-10-CM

## 2018-03-18 DIAGNOSIS — F17200 Nicotine dependence, unspecified, uncomplicated: Secondary | ICD-10-CM | POA: Diagnosis not present

## 2018-03-18 DIAGNOSIS — R1013 Epigastric pain: Secondary | ICD-10-CM | POA: Diagnosis not present

## 2018-03-18 DIAGNOSIS — E785 Hyperlipidemia, unspecified: Secondary | ICD-10-CM

## 2018-03-18 NOTE — Assessment & Plan Note (Signed)
I have independently reviewed her CT angiogram and this demonstrates a trilobed thoracoabdominal aneurysm with the maximal diameter in the 4.4 cm range just below the renal arteries.  Above the visceral vessels, this measured about 4 cm in maximal diameter.  In the terminal aorta, it was closer to 3-1/2 cm in maximal diameter. No extravasation This is stable and much more in line to her previous ultrasound findings.  The location of the aneurysm is very concerning, and the repair will be very difficult if it ever grows to a size to need repaired.  For a thoracoabdominal aneurysm, we would likely defer repair until 5.5 cm.  Smoking cessation and blood pressure control strongly encouraged.  Recheck in 6 months.

## 2018-03-18 NOTE — Progress Notes (Signed)
MRN : [id number]  Joy Carpenter is a 67 y.o. (24-Apr-1951) female who presents with chief complaint of  Chief Complaint  Patient presents with  . Follow-up    ct results  .  History of Present Illness: Patient returns today in follow up of abdominal aortic aneurysm. She continues to have pain under her left rib cage that has gradually worsened.  She has previously been told she had a hernia but when she got her aneurysm diagnosis this seemed to be no longer pursued.  An ultrasound a couple of months ago had suggested a dramatic enlargement of her aneurysm up to about 5.5 cm.  Previously, this had been about 4.5 cm.  I have independently reviewed her CT angiogram and this demonstrates a trilobed thoracoabdominal aneurysm with the maximal diameter in the 4.4 cm range just below the renal arteries.  Above the visceral vessels, this measured about 4 cm in maximal diameter.  In the terminal aorta, it was closer to 3-1/2 cm in maximal diameter. No extravasation  Current Outpatient Medications  Medication Sig Dispense Refill  . ALPRAZolam (XANAX) 0.25 MG tablet Take 1 tablet (0.25 mg total) by mouth daily as needed for anxiety. for anxiety 30 tablet 3  . aspirin EC 81 MG tablet Take 81 mg by mouth daily.    Marland Kitchen levothyroxine (SYNTHROID, LEVOTHROID) 50 MCG tablet ONE TAB BY MOUTH IN THE MORNING 30 MINUTES PRIOR TO FOOD.  1  . pantoprazole (PROTONIX) 40 MG tablet TAKE 1 TABLET BY MOUTH EVERY DAY 90 tablet 1  . polyethylene glycol powder (GLYCOLAX/MIRALAX) powder AS DIRECTED FOR COLONIC PREP.  0  . simvastatin (ZOCOR) 20 MG tablet Take 20 mg by mouth daily.  1  . traMADol-acetaminophen (ULTRACET) 37.5-325 MG tablet Take 1 tablet by mouth 2 (two) times daily as needed. for pain  2  . valACYclovir (VALTREX) 500 MG tablet TAKE 1 TABLET BY MOUTH EVERY DAY FOR OUTBREAK AS NEEDED  5  . zolpidem (AMBIEN) 10 MG tablet Take 1 tablet (10 mg total) by mouth at bedtime. 30 tablet 3   No current  facility-administered medications for this visit.     Past Medical History:  Diagnosis Date  . Anxiety   . Aortic aneurysm ([place])   . Depression   . Emphysema of lung ([place])    "mild"  . GERD (gastroesophageal reflux disease)   . Headache    daily - stress  . Herpes   . Hyperlipidemia   . Insomnia   . Osteoporosis   . Vertigo    1 episode - approx 2010   . Weakness of both legs    intermittent    Past Surgical History:  Procedure Laterality Date  . COLONOSCOPY    . COLONOSCOPY WITH PROPOFOL N/A 06/14/2016   Procedure: COLONOSCOPY WITH PROPOFOL;  Surgeon: Lucilla Lame, MD;  Location: [facility];  Service: Endoscopy;  Laterality: N/A;  . ESOPHAGOGASTRODUODENOSCOPY (EGD) WITH PROPOFOL N/A 06/14/2016   Procedure: ESOPHAGOGASTRODUODENOSCOPY (EGD) WITH PROPOFOL;  Surgeon: Lucilla Lame, MD;  Location: [facility];  Service: Endoscopy;  Laterality: N/A;  . TUBAL LIGATION             Social History  Substance Use Topics  . Smoking status: Current Every Day Smoker    Packs/day: 1.00    Years: 30.00    Types: Cigarettes  . Smokeless tobacco: Never Used  . Alcohol use No     Family History Half brother with an aneurysms No bleeding or  clotting disorders No porphyria.  No autoimmune diseases  No Known Allergies   REVIEW OF SYSTEMS(Negative unless checked)  Constitutional: [] Weight loss[] Fever[] Chills Cardiac:[] Chest pain[] Chest pressure[] Palpitations [] Shortness of breath when laying flat [] Shortness of breath at rest [] Shortness of breath with exertion. Vascular: [] Pain in legs with walking[] Pain in legsat rest[] Pain in legs when laying flat [] Claudication [] Pain in feet when walking [] Pain in feet at rest [] Pain in feet when laying flat [] History of DVT [] Phlebitis [] Swelling in legs [] Varicose veins [] Non-healing ulcers Pulmonary: [] Uses home oxygen [] Productive cough[] Hemoptysis [] Wheeze  [x] COPD [] Asthma Neurologic: [] Dizziness [] Blackouts [] Seizures [] History of stroke [] History of TIA[] Aphasia [] Temporary blindness[] Dysphagia [] Weaknessor numbness in arms [] Weakness or numbnessin legs Musculoskeletal: [x] Arthritis [] Joint swelling [] Joint pain [] Low back pain Hematologic:[] Easy bruising[] Easy bleeding [] Hypercoagulable state [] Anemic  Gastrointestinal:[] Blood in stool[] Vomiting blood[x] Gastroesophageal reflux/heartburn[x] Abdominal pain Genitourinary: [] Chronic kidney disease [] Difficulturination [] Frequenturination [] Burning with urination[] Hematuria Skin: [] Rashes [] Ulcers [] Wounds Psychological: [] History of anxiety[] History of major depression.      Physical Examination  BP 131/85 (BP Location: Right Arm)   Pulse 72   Resp 17   Ht 5\' 6"  (1.676 m)   Wt 142 lb 12.8 oz (64.8 kg)   BMI 23.05 kg/m  Gen:  WD/WN, NAD Head: [place]/AT, No temporalis wasting. Ear/Nose/Throat: Hearing grossly intact, nares w/o erythema or drainage Eyes: Conjunctiva clear. Sclera non-icteric Neck: Supple.  Trachea midline Pulmonary:  Good air movement, no use of accessory muscles.  Cardiac: RRR, no JVD Vascular:  Vessel Right Left  Radial Palpable Palpable                          PT Palpable Palpable  DP Palpable Palpable   Gastrointestinal: soft, non-tender/non-distended. Enlarged aortic impulse Musculoskeletal: M/S 5/5 throughout.  No deformity or atrophy. No edema. Neurologic: Sensation grossly intact in extremities.  Symmetrical.  Speech is fluent.  Psychiatric: Judgment intact, Mood & affect appropriate for pt's clinical situation. Dermatologic: No rashes or ulcers noted.  No cellulitis or open wounds.       Labs Recent Results (from the past 2160 hour(s))  I-STAT creatinine     Status: None   Collection Time: 03/10/18 12:57 PM  Result Value Ref Range   Creatinine, Ser 0.90 0.44 - 1.00 mg/dL     Radiology Ct Angio Abd/pel W/ And/or W/o  Result Date: 03/10/2018 CLINICAL DATA:  Evaluate abdominal aortic aneurysm prior to potential stenting. EXAM: CTA ABDOMEN AND PELVIS WITH CONTRAST TECHNIQUE: Multidetector CT imaging of the abdomen and pelvis was performed using the standard protocol during bolus administration of intravenous contrast. Multiplanar reconstructed images and MIPs were obtained and reviewed to evaluate the vascular anatomy. CONTRAST:  17mL ISOVUE-370 IOPAMIDOL (ISOVUE-370) INJECTION 76% COMPARISON:  CT abdomen pelvis-05/02/2016; chest CT-05/01/2017; 05/16/2016 FINDINGS: VASCULAR Aorta: There is fusiform aneurysmal dilatation of the distal aspect of the descending thoracic aorta measuring approximately 4.1 cm in maximal diameter cranial to the diaphragmatic hiatus (axial image 20, series 4), unchanged compared to abdominal CT performed 04/2016. The thoracic aorta tapers to a near normal caliber for a length of approximately 3.4 cm at the takeoff of the level of the celiac artery and SMA. There is a tri lobed infrarenal abdominal aortic aneurysm with dominant cranial component measuring approximately 4.0 x 4.4 x 3.7 cm as measured in greatest oblique short axis axial (image 49, series 4), coronal (image 43, series 6) and sagittal (image 79, series 7) dimensions respectively, unchanged to minimally increased in size compared to the 04/2016 examination, previously, 4.0 x 3.9 x 4.0 cm.  Mid component of the tri lobed infrarenal abdominal aortic aneurysm measures approximately 2.9 x3.0 x 3.0 cm as measured in greatest oblique short axis axial (image 62, series 4), coronal (image 38, series 6) and sagittal (image 77, series 7) dimensions respectively, increased in size in the interval, previously, 2.7 x 2.7 x 2.9 cm. The caudal component of the tri lobed infrarenal abdominal aortic aneurysm measures approximately 3.2 x 3.3 x 3.0 cm as measured in greatest oblique short axis axial (image 72,  series 4), coronal (image 38, series 6) and sagittal (image 80, series 7) dimensions respectively, increased in size in the interval, previously, 3.0 x 2.9 x 2.8 cm. 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 No abdominal aortic dissection or periaortic stranding. The cranial aspect of the infrarenal abdominal aortic aneurysm originates at the takeoff of the bilateral renal arteries and extends to the level of the aortic bifurcation without extension into either common iliac arteries. Celiac: Irregular mixed calcified and noncalcified atherosclerotic plaque involves the origin of the celiac artery, not definitely resulting in a hemodynamically significant stenosis. Conventional branching pattern. SMA: Minimal amount of mixed calcified and noncalcified atherosclerotic plaque involving the origin the SMA, not resulting in hemodynamically significant stenosis. Conventional branching pattern. The distal tributaries the SMA appear widely patent without discrete intraluminal filling defect to suggest distal embolism. Renals: Solitary bilaterally. There is eccentric calcified and noncalcified atherosclerotic plaque involving the origin of the bilateral renal arteries, approaching 50% luminal narrowing bilaterally however there is no evidence of end organ ischemia or asymmetric renal atrophy. No vessel irregularity to suggest FMD. IMA: Remains patent. Inflow: There is a minimal amount of predominantly calcified though slightly irregular atherosclerotic plaque within the bilateral normal caliber common iliac arteries, not resulting in hemodynamically significant stenosis. The bilateral internal iliac arteries are heavily disease though patent of normal caliber. The bilateral external iliac arteries are widely patent without hemodynamically significant stenosis. Proximal Outflow: Eccentric  calcified and noncalcified atherosclerotic plaque involving the bilateral common femoral arteries, left greater than right, not definitely resulting in hemodynamically significant stenosis. The imaged portions of the bilateral superficial femoral and deep femoral arteries appear widely patent throughout their imaged course. Veins: The IVC and pelvic venous system appears widely patent. Review of the MIP images confirms the above findings. NON-VASCULAR Lower chest: Limited visualization of the lower thorax demonstrates minimal right basilar subsegmental atelectasis. No focal airspace opacities. No pleural effusion. Re-demonstrated approximately 4 mm left lower lobe (image 17, series 5) and an approximately 0.6 cm right lower lobe (image 32, series 5) pulmonary nodules, both of which appear similar to the 04/2016 examination. The approximately 0.5 cm nodule within the left costophrenic angle (image 24, series 5, is unchanged compared to the 05/2016 examination. Normal heart size.  No pericardial effusion. Hepatobiliary: Normal hepatic contour. No discrete hepatic lesions. Normal appearance of the gallbladder given degree distention. No intra extrahepatic biliary duct dilatation. No ascites. Pancreas: Normal appearance of the pancreas Spleen: Normal appearance of the spleen Adrenals/Urinary Tract: There is symmetric enhancement and excretion of the bilateral kidneys. No definite renal stones this postcontrast examination. No discrete renal lesions. No urine obstruction or perinephric stranding. Normal appearance the bilateral adrenal glands. Normal appearance of the urinary bladder given degree distention. Stomach/Bowel: Rather extensive colonic diverticulosis without evidence of superimposed acute diverticulitis. Moderate to large colonic stool burden without evidence of enteric obstruction. Normal appearance of the  terminal ileum and retrocecal appendix. No pneumoperitoneum, pneumatosis or portal venous gas.  Lymphatic: No bulky retroperitoneal, mesenteric, pelvic or inguinal lymphadenopathy. Reproductive: Normal appearance of the pelvic organs for age. No discrete adnexal lesion. No free fluid in the pelvic cul-de-sac. Other: Regional soft tissues appear normal. Musculoskeletal: Increased sclerosis involving the left side of L2 vertebral body is favored to represent healing associated with the mild to moderate presumably degenerative scoliosis of the thoracolumbar spine as the left-side of the L2 vertebral body is located at the central concavity of one of the scoliotic curvatures. No aggressive features associated with this increased sclerosis, specifically, no periostitis or cortical destruction and thus is favored to be of benign etiology. No definite acute or aggressive osseous abnormalities. Mild-to-moderate multilevel lumbar spine DDD, likely worse at L4-L5 and L5-S1 with disc space height loss, endplate irregularity and sclerosis. IMPRESSION: VASCULAR 1. Interval increase in size tried lobed infrarenal abdominal aortic aneurysm with dominant cranial component measuring 4.4 cm in maximal diameter, previously, 4.0 cm. There is a moderate to large amount of crescentic mural thrombus within the dominant cranial component of the trial lobe aneurysm, progressed compared to the 04/2016 examination though not resulting in hemodynamically significant stenosis. No abdominal aortic dissection or periaortic stranding. Aortic aneurysm NOS (ICD10-I71.9). 2. Grossly unchanged fusiform aneurysmal dilatation of the imaged distal aspect of the thoracic aorta measuring approximately 4.1 cm cranial to the diaphragmatic hiatus. 3. Potential hemodynamically significant narrowing involving the origin the bilateral renal arteries without associated renal atrophy or stigmata end organ ischemia. 4.  Aortic Atherosclerosis (ICD10-I70.0). NON-VASCULAR 1. Incidentally imaged bilateral lower lobe pulmonary nodules appear unchanged since at  least the 05/2016 examination. Stability for nearly 2 years is indicative of benign etiology. Electronically Signed   By: Sandi Mariscal M.D.   On: 03/10/2018 14:07    Assessment/Plan Hyperlipidemia lipid control important in reducing the progression of atherosclerotic disease. Continue statin therapy   Tobacco use disorder We had a discussion for approximately3-47minutes regarding the absolute need for smoking cessation due to the deleterious nature of tobacco on the vascular system, particularly with aneurysm growth in her case. We discussed the tobacco use would diminish patency of any intervention, and likely significantly worsen progressio of disease. We discussed multiple agents for quitting including replacement therapy or medications to reduce cravings such as Chantix. The patient voices their understanding of the importance of smoking cessation.   Abdominal pain, epigastric Although not impossible, this is less likely to be related to a stable aneurysm.  Possibly from her hernia?  AAA (abdominal aortic aneurysm) without rupture ([place])  I have independently reviewed her CT angiogram and this demonstrates a trilobed thoracoabdominal aneurysm with the maximal diameter in the 4.4 cm range just below the renal arteries.  Above the visceral vessels, this measured about 4 cm in maximal diameter.  In the terminal aorta, it was closer to 3-1/2 cm in maximal diameter. No extravasation This is stable and much more in line to her previous ultrasound findings.  The location of the aneurysm is very concerning, and the repair will be very difficult if it ever grows to a size to need repaired.  For a thoracoabdominal aneurysm, we would likely defer repair until 5.5 cm.  Smoking cessation and blood pressure control strongly encouraged.  Recheck in 6 months.    Leotis Pain, MD  03/18/2018 12:33 PM    This note was created with Dragon medical transcription system.  Any errors from dictation are purely  unintentional

## 2018-03-18 NOTE — Patient Instructions (Signed)
Abdominal Aortic Aneurysm Blood pumps away from the heart through tubes (blood vessels) called arteries. Aneurysms are weak or damaged places in the wall of an artery. It bulges out like a balloon. An abdominal aortic aneurysm happens in the main artery of the body (aorta). It can burst or tear, causing bleeding inside the body. This is an emergency. It needs treatment right away. What are the causes? The exact cause is unknown. Things that could cause this problem include:  Fat and other substances building up in the lining of a tube.  Swelling of the walls of a blood vessel.  Certain tissue diseases.  Belly (abdominal) trauma.  An infection in the main artery of the body.  What increases the risk? There are things that make it more likely for you to have an aneurysm. These include:  Being over the age of 65 years old.  Having high blood pressure (hypertension).  Being a female.  Being white.  Being very overweight (obese).  Having a family history of aneurysm.  Using tobacco products.  What are the signs or symptoms? Symptoms depend on the size of the aneurysm and how fast it grows. There may not be symptoms. If symptoms occur, they can include:  Pain (belly, side, lower back, or groin).  Feeling full after eating a small amount of food.  Feeling sick to your stomach (nauseous), throwing up (vomiting), or both.  Feeling a lump in your belly that feels like it is beating (pulsating).  Feeling like you will pass out (faint).  How is this treated?  Medicine to control blood pressure and pain.  Imaging tests to see if the aneurysm gets bigger.  Surgery. How is this prevented? To lessen your chance of getting this condition:  Stop smoking. Stop chewing tobacco.  Limit or avoid alcohol.  Keep your blood pressure, blood sugar, and cholesterol within normal limits.  Eat less salt.  Eat foods low in saturated fats and cholesterol. These are found in animal and  whole dairy products.  Eat more fiber. Fiber is found in whole grains, vegetables, and fruits.  Keep a healthy weight.  Stay active and exercise often.  This information is not intended to replace advice given to you by your health care provider. Make sure you discuss any questions you have with your health care provider. Document Released: 01/19/2013 Document Revised: 03/01/2016 Document Reviewed: 10/24/2012 Elsevier Interactive Patient Education  2017 Elsevier Inc.  

## 2018-03-18 NOTE — Assessment & Plan Note (Signed)
Although not impossible, this is less likely to be related to a stable aneurysm.  Possibly from her hernia?

## 2018-04-14 ENCOUNTER — Other Ambulatory Visit (INDEPENDENT_AMBULATORY_CARE_PROVIDER_SITE_OTHER): Payer: Self-pay | Admitting: Internal Medicine

## 2018-05-01 ENCOUNTER — Encounter: Payer: Self-pay | Admitting: Nurse Practitioner

## 2018-05-01 ENCOUNTER — Ambulatory Visit (INDEPENDENT_AMBULATORY_CARE_PROVIDER_SITE_OTHER): Payer: BLUE CROSS/BLUE SHIELD | Admitting: Nurse Practitioner

## 2018-05-01 VITALS — BP 143/83 | HR 75 | Resp 16 | Ht 67.0 in | Wt 144.4 lb

## 2018-05-01 DIAGNOSIS — F5101 Primary insomnia: Secondary | ICD-10-CM

## 2018-05-01 DIAGNOSIS — Z0001 Encounter for general adult medical examination with abnormal findings: Secondary | ICD-10-CM | POA: Diagnosis not present

## 2018-05-01 DIAGNOSIS — K5904 Chronic idiopathic constipation: Secondary | ICD-10-CM

## 2018-05-01 DIAGNOSIS — F411 Generalized anxiety disorder: Secondary | ICD-10-CM

## 2018-05-01 DIAGNOSIS — B009 Herpesviral infection, unspecified: Secondary | ICD-10-CM

## 2018-05-01 DIAGNOSIS — R300 Dysuria: Secondary | ICD-10-CM

## 2018-05-01 MED ORDER — VALACYCLOVIR HCL 500 MG PO TABS: 500.0000 mg | ORAL_TABLET | Freq: Two times a day (BID) | ORAL | 2 refills | Status: DC

## 2018-05-01 MED ORDER — PRUCALOPRIDE SUCCINATE 2 MG PO TABS: 2.0000 mg | ORAL_TABLET | Freq: Every day | ORAL | 0 refills | Status: DC

## 2018-05-01 MED ORDER — ZOLPIDEM TARTRATE 10 MG PO TABS: 10.0000 mg | ORAL_TABLET | Freq: Every day | ORAL | 3 refills | Status: DC

## 2018-05-01 MED ORDER — ALPRAZOLAM 0.25 MG PO TABS: 0.2500 mg | ORAL_TABLET | Freq: Every day | ORAL | 3 refills | Status: DC | PRN

## 2018-05-01 NOTE — Progress Notes (Signed)
[facility] [place], [place] [address]  Internal MEDICINE  Office Visit Note  Patient Name: Joy Carpenter  [id number]  [id number]  Date of Service: 05/18/2018   Pt is here for routine health maintenance examination   Chief Complaint  Patient presents with  . Annual Exam    4 month cpe  . Abdominal Pain    a lot of pain on the left side. when sit straight up feels like a knot under the ribs on the left side.     Abdominal Pain  This is a recurrent problem. The current episode started more than 1 month ago. The onset quality is gradual. The problem occurs intermittently. The problem has been waxing and waning. The pain is located in the LLQ and LUQ. The pain is at a severity of 3/10. The pain is mild. The quality of the pain is aching and cramping. The abdominal pain does not radiate. Associated symptoms include anorexia, constipation and nausea. Pertinent negatives include no arthralgias, diarrhea, dysuria, frequency, headaches or vomiting. The pain is aggravated by palpation. The pain is relieved by bowel movements, certain positions and passing flatus. She has tried antacids and H2 blockers for the symptoms. The treatment provided no relief.     Current Medication: Outpatient Encounter Medications as of 05/01/2018  Medication Sig Note  . ALPRAZolam (XANAX) 0.25 MG tablet Take 1 tablet (0.25 mg total) by mouth daily as needed for anxiety. for anxiety   . aspirin EC 81 MG tablet Take 81 mg by mouth daily.   Marland Kitchen levothyroxine (SYNTHROID, LEVOTHROID) 50 MCG tablet TAKE 1 TABLET BY MOUTH EVERY MORNING 30 MINUTES PRIOR TO EATING FOOD   . pantoprazole (PROTONIX) 40 MG tablet TAKE 1 TABLET BY MOUTH EVERY DAY   . polyethylene glycol powder (GLYCOLAX/MIRALAX) powder AS DIRECTED FOR COLONIC PREP. 06/08/2016: Received from: External Pharmacy  . traMADol-acetaminophen (ULTRACET) 37.5-325 MG tablet Take 1 tablet by mouth 2 (two) times daily as needed. for pain   .  valACYclovir (VALTREX) 500 MG tablet Take 1 tablet (500 mg total) by mouth 2 (two) times daily.   Marland Kitchen zolpidem (AMBIEN) 10 MG tablet Take 1 tablet (10 mg total) by mouth at bedtime.   . [DISCONTINUED] ALPRAZolam (XANAX) 0.25 MG tablet Take 1 tablet (0.25 mg total) by mouth daily as needed for anxiety. for anxiety   . [DISCONTINUED] simvastatin (ZOCOR) 20 MG tablet Take 20 mg by mouth daily. 06/12/2016: Received from: External Pharmacy Received Sig: TAKE 1 TABLET BY MOUTH EVERY DAY  . [DISCONTINUED] valACYclovir (VALTREX) 500 MG tablet TAKE 1 TABLET BY MOUTH EVERY DAY FOR OUTBREAK AS NEEDED 06/14/2016: PRN, last used 5-6 months ago   . [DISCONTINUED] zolpidem (AMBIEN) 10 MG tablet Take 1 tablet (10 mg total) by mouth at bedtime.   . Prucalopride Succinate (MOTEGRITY) 2 MG TABS Take 2 mg by mouth daily.    No facility-administered encounter medications on file as of 05/01/2018.     Surgical History: Past Surgical History:  Procedure Laterality Date  . COLONOSCOPY    . COLONOSCOPY WITH PROPOFOL N/A 06/14/2016   Procedure: COLONOSCOPY WITH PROPOFOL;  Surgeon: Lucilla Lame, MD;  Location: [place];  Service: Endoscopy;  Laterality: N/A;  . ESOPHAGOGASTRODUODENOSCOPY (EGD) WITH PROPOFOL N/A 06/14/2016   Procedure: ESOPHAGOGASTRODUODENOSCOPY (EGD) WITH PROPOFOL;  Surgeon: Lucilla Lame, MD;  Location: [place];  Service: Endoscopy;  Laterality: N/A;  . TUBAL LIGATION      Medical History: Past Medical History:  Diagnosis Date  .  Anxiety   . Aortic aneurysm ([place])   . Depression   . Emphysema of lung ([place])    "mild"  . GERD (gastroesophageal reflux disease)   . Headache    daily - stress  . Herpes   . Hyperlipidemia   . Insomnia   . Osteoporosis   . Vertigo    1 episode - approx 2010   . Weakness of both legs    intermittent    Family History: Family History  Problem Relation Age of Onset  . Leukemia Mother   . Aneurysm Other       Review of Systems   Constitutional: Negative for activity change, chills, fatigue and unexpected weight change.  HENT: Negative for congestion, postnasal drip, rhinorrhea, sneezing and sore throat.   Eyes: Negative.  Negative for redness.  Respiratory: Negative for cough, chest tightness, shortness of breath and wheezing.   Cardiovascular: Negative for chest pain and palpitations.  Gastrointestinal: Positive for abdominal distention, abdominal pain, anorexia, constipation and nausea. Negative for diarrhea and vomiting.       Level of pain improved since she started taking pantoprazole.  Endocrine:       Well controlled thyroid disease  Genitourinary: Negative.  Negative for dysuria and frequency.  Musculoskeletal: Negative for arthralgias, back pain, joint swelling and neck pain.  Skin: Negative for rash.  Allergic/Immunologic: Negative for environmental allergies.  Neurological: Negative for dizziness, tremors, numbness and headaches.  Hematological: Negative for adenopathy. Does not bruise/bleed easily.  Psychiatric/Behavioral: Negative for behavioral problems (Depression), sleep disturbance and suicidal ideas. The patient is nervous/anxious.      Today's Vitals   05/01/18 1111  BP: (!) 143/83  Pulse: 75  Resp: 16  SpO2: 96%  Weight: 144 lb 6.4 oz (65.5 kg)  Height: 5\' 7"  (1.702 m)    Physical Exam  Constitutional: She is oriented to person, place, and time. She appears well-developed and well-nourished. No distress.  HENT:  Head: Normocephalic and atraumatic.  Mouth/Throat: Oropharynx is clear and moist. No oropharyngeal exudate.  Eyes: Pupils are equal, round, and reactive to light. EOM are normal.  Neck: Normal range of motion. Neck supple. No JVD present. Carotid bruit is not present. No tracheal deviation present. No thyromegaly present.  Cardiovascular: Normal rate, regular rhythm, normal heart sounds and intact distal pulses. Exam reveals no gallop and no friction rub.  No murmur  heard. Pulmonary/Chest: Effort normal and breath sounds normal. No respiratory distress. She has no wheezes. She has no rales. She exhibits no tenderness. Right breast exhibits no inverted nipple, no mass, no nipple discharge, no skin change and no tenderness. Left breast exhibits no inverted nipple, no mass, no nipple discharge, no skin change and no tenderness.  Abdominal: Soft. Bowel sounds are normal. She exhibits distension. There is tenderness in the left upper quadrant and left lower quadrant.  Musculoskeletal: Normal range of motion.  Lymphadenopathy:    She has no cervical adenopathy.  Neurological: She is alert and oriented to person, place, and time. No cranial nerve deficit.  Skin: Skin is warm and dry. Capillary refill takes less than 2 seconds. She is not diaphoretic.  Psychiatric: She has a normal mood and affect. Her behavior is normal. Judgment and thought content normal.  Nursing note and vitals reviewed.    LABS: Recent Results (from the past 2160 hour(s))  I-STAT creatinine     Status: None   Collection Time: 03/10/18 12:57 PM  Result Value Ref Range   Creatinine, Ser 0.90 0.44 -  1.00 mg/dL  UA/M w/rflx Culture, Routine     Status: None   Collection Time: 05/01/18 12:00 AM  Result Value Ref Range   Specific Gravity, UA CANCELED     Comment: Quantity was not sufficient for analysis.  Result canceled by the ancillary.    pH, UA CANCELED     Comment: Test not performed  Result canceled by the ancillary.    Protein, UA CANCELED     Comment: Test not performed  Result canceled by the ancillary.    Glucose, UA CANCELED     Comment: Test not performed  Result canceled by the ancillary.    Ketones, UA CANCELED     Comment: Test not performed  Result canceled by the ancillary.     Assessment/Plan: 1. Encounter for general adult medical examination with abnormal findings Annual health maintenance exam today.  2. Chronic idiopathic constipation Likely  reason for left upper and lower quadrannt abdominal tenderness. Will do trial motegrity. Samples provided today. Will send ini prescription if efffective.  - Prucalopride Succinate (MOTEGRITY) 2 MG TABS; Take 2 mg by mouth daily.  Dispense: 30 tablet; Refill: 0  3. Generalized anxiety disorder - ALPRAZolam (XANAX) 0.25 MG tablet; Take 1 tablet (0.25 mg total) by mouth daily as needed for anxiety. for anxiety  Dispense: 30 tablet; Refill: 3  4. Primary insomnia - zolpidem (AMBIEN) 10 MG tablet; Take 1 tablet (10 mg total) by mouth at bedtime.  Dispense: 30 tablet; Refill: 3  5. Herpes simplex disease - valACYclovir (VALTREX) 500 MG tablet; Take 1 tablet (500 mg total) by mouth 2 (two) times daily.  Dispense: 60 tablet; Refill: 2  6. Dysuria - UA/M w/rflx Culture, Routine  General Counseling: annsleigh [email or web address] understanding of the findings of todays visit and agrees with plan of treatment. I have discussed any further diagnostic evaluation that may be needed or ordered today. We also reviewed her medications today. she has been encouraged to call the office with any questions or concerns that should arise related to todays visit.    Counseling:  This patient was seen by Leretha Pol FNP Collaboration with Dr Lavera Guise as a part of collaborative care agreement  Orders Placed This Encounter  Procedures  . UA/M w/rflx Culture, Routine    Meds ordered this encounter  Medications  . ALPRAZolam (XANAX) 0.25 MG tablet    Sig: Take 1 tablet (0.25 mg total) by mouth daily as needed for anxiety. for anxiety    Dispense:  30 tablet    Refill:  3    Order Specific Question:   Supervising Provider    Answer:   Lavera Guise [[id number]]  . zolpidem (AMBIEN) 10 MG tablet    Sig: Take 1 tablet (10 mg total) by mouth at bedtime.    Dispense:  30 tablet    Refill:  3    Order Specific Question:   Supervising Provider    Answer:   Lavera Guise [[id number]]  . valACYclovir (VALTREX) 500 MG tablet     Sig: Take 1 tablet (500 mg total) by mouth 2 (two) times daily.    Dispense:  60 tablet    Refill:  2    Order Specific Question:   Supervising Provider    Answer:   Lavera Guise [[id number]]  . Prucalopride Succinate (MOTEGRITY) 2 MG TABS    Sig: Take 2 mg by mouth daily.    Dispense:  30 tablet    Refill:  0  Sample and copay card provided today    Order Specific Question:   Supervising Provider    Answer:   Lavera Guise [[id number]]    Time spent: [place], MD  Internal Medicine

## 2018-05-05 LAB — UA/M W/RFLX CULTURE, ROUTINE

## 2018-05-12 ENCOUNTER — Other Ambulatory Visit (INDEPENDENT_AMBULATORY_CARE_PROVIDER_SITE_OTHER): Payer: Self-pay | Admitting: Internal Medicine

## 2018-05-18 DIAGNOSIS — K5904 Chronic idiopathic constipation: Secondary | ICD-10-CM | POA: Insufficient documentation

## 2018-05-18 DIAGNOSIS — R300 Dysuria: Secondary | ICD-10-CM | POA: Insufficient documentation

## 2018-05-18 DIAGNOSIS — B009 Herpesviral infection, unspecified: Secondary | ICD-10-CM | POA: Insufficient documentation

## 2018-06-10 ENCOUNTER — Other Ambulatory Visit: Payer: Self-pay

## 2018-06-10 DIAGNOSIS — K5904 Chronic idiopathic constipation: Secondary | ICD-10-CM

## 2018-06-10 MED ORDER — PRUCALOPRIDE SUCCINATE 2 MG PO TABS: 2.0000 mg | ORAL_TABLET | Freq: Every day | ORAL | 0 refills | Status: DC

## 2018-08-01 ENCOUNTER — Other Ambulatory Visit: Payer: Self-pay

## 2018-08-01 DIAGNOSIS — B009 Herpesviral infection, unspecified: Secondary | ICD-10-CM

## 2018-08-01 MED ORDER — VALACYCLOVIR HCL 500 MG PO TABS: 500.0000 mg | ORAL_TABLET | Freq: Two times a day (BID) | ORAL | 2 refills | Status: DC

## 2018-08-26 ENCOUNTER — Encounter: Payer: Self-pay | Admitting: Nurse Practitioner

## 2018-08-26 ENCOUNTER — Ambulatory Visit (INDEPENDENT_AMBULATORY_CARE_PROVIDER_SITE_OTHER): Payer: BLUE CROSS/BLUE SHIELD | Admitting: Nurse Practitioner

## 2018-08-26 VITALS — BP 124/78 | HR 73 | Resp 16 | Ht 67.0 in | Wt 144.0 lb

## 2018-08-26 DIAGNOSIS — J069 Acute upper respiratory infection, unspecified: Secondary | ICD-10-CM | POA: Diagnosis not present

## 2018-08-26 DIAGNOSIS — G5603 Carpal tunnel syndrome, bilateral upper limbs: Secondary | ICD-10-CM | POA: Diagnosis not present

## 2018-08-26 DIAGNOSIS — F5101 Primary insomnia: Secondary | ICD-10-CM | POA: Diagnosis not present

## 2018-08-26 DIAGNOSIS — K5904 Chronic idiopathic constipation: Secondary | ICD-10-CM

## 2018-08-26 DIAGNOSIS — F411 Generalized anxiety disorder: Secondary | ICD-10-CM

## 2018-08-26 MED ORDER — ZOLPIDEM TARTRATE 10 MG PO TABS: 10.0000 mg | ORAL_TABLET | Freq: Every day | ORAL | 3 refills | Status: DC

## 2018-08-26 MED ORDER — MELOXICAM 7.5 MG PO TABS: 7.5000 mg | ORAL_TABLET | Freq: Every day | ORAL | 3 refills | Status: DC

## 2018-08-26 MED ORDER — ALPRAZOLAM 0.25 MG PO TABS: 0.2500 mg | ORAL_TABLET | Freq: Every day | ORAL | 3 refills | Status: DC | PRN

## 2018-08-26 MED ORDER — AMOXICILLIN-POT CLAVULANATE 875-125 MG PO TABS: 1.0000 | ORAL_TABLET | Freq: Two times a day (BID) | ORAL | 0 refills | Status: DC

## 2018-08-26 MED ORDER — LUBIPROSTONE 8 MCG PO CAPS: 8.0000 ug | ORAL_CAPSULE | Freq: Two times a day (BID) | ORAL | 3 refills | Status: DC

## 2018-08-26 NOTE — Progress Notes (Signed)
[facility] [place], [place] [address]  Internal MEDICINE  Office Visit Note  Patient Name: Joy Carpenter  [id number]  [id number]  Date of Service: 08/27/2018  Chief Complaint  Patient presents with  . Medical Management of Chronic Issues    4 month routine follow up  . Hyperlipidemia  . Ear Problem    pt thinks she have an ear infection in right ear.  . Pain    pt is having paininher right wrist/hand, possible carpel tunnel    The patient is c/o chronic abdominal discomfort. Chronic, ideopathic constipation has been pinned as the cause. Did trial of motegrity, which did help, however, the prescription was too expensive. She has been using OTC miralax which causes her to have increased abdominal pain.  She is c/o nasal congestion, headache, sinus pressure and cough. This has been going on for several days. Has tried OTC medications to help, however, symptoms continue to get worse.  Finally, she has pain in both wrists. This pain goes up the inner aspect of the wrists, into the thumb and first finger. Worse at work when using her hands. ROM and strength are currently intact .      Current Medication: Outpatient Encounter Medications as of 08/26/2018  Medication Sig Note  . ALPRAZolam (XANAX) 0.25 MG tablet Take 1 tablet (0.25 mg total) by mouth daily as needed for anxiety. for anxiety   . aspirin EC 81 MG tablet Take 81 mg by mouth daily.   Marland Kitchen levothyroxine (SYNTHROID, LEVOTHROID) 50 MCG tablet TAKE 1 TABLET BY MOUTH EVERY MORNING 30 MINUTES PRIOR TO EATING FOOD   . pantoprazole (PROTONIX) 40 MG tablet TAKE 1 TABLET BY MOUTH EVERY DAY   . polyethylene glycol powder (GLYCOLAX/MIRALAX) powder AS DIRECTED FOR COLONIC PREP. 06/08/2016: Received from: External Pharmacy  . simvastatin (ZOCOR) 20 MG tablet TAKE 1 TABLET BY MOUTH EVERY DAY   . traMADol-acetaminophen (ULTRACET) 37.5-325 MG tablet Take 1 tablet by mouth 2 (two) times daily as needed. for pain   .  valACYclovir (VALTREX) 500 MG tablet Take 1 tablet (500 mg total) by mouth 2 (two) times daily.   Marland Kitchen zolpidem (AMBIEN) 10 MG tablet Take 1 tablet (10 mg total) by mouth at bedtime.   . [DISCONTINUED] ALPRAZolam (XANAX) 0.25 MG tablet Take 1 tablet (0.25 mg total) by mouth daily as needed for anxiety. for anxiety   . [DISCONTINUED] Prucalopride Succinate (MOTEGRITY) 2 MG TABS Take 2 mg by mouth daily.   . [DISCONTINUED] zolpidem (AMBIEN) 10 MG tablet Take 1 tablet (10 mg total) by mouth at bedtime.   Marland Kitchen amoxicillin-clavulanate (AUGMENTIN) 875-125 MG tablet Take 1 tablet by mouth 2 (two) times daily.   Marland Kitchen lubiprostone (AMITIZA) 8 MCG capsule Take 1 capsule (8 mcg total) by mouth 2 (two) times daily with a meal.   . meloxicam (MOBIC) 7.5 MG tablet Take 1 tablet (7.5 mg total) by mouth daily.    No facility-administered encounter medications on file as of 08/26/2018.     Surgical History: Past Surgical History:  Procedure Laterality Date  . COLONOSCOPY    . COLONOSCOPY WITH PROPOFOL N/A 06/14/2016   Procedure: COLONOSCOPY WITH PROPOFOL;  Surgeon: Lucilla Lame, MD;  Location: [place];  Service: Endoscopy;  Laterality: N/A;  . ESOPHAGOGASTRODUODENOSCOPY (EGD) WITH PROPOFOL N/A 06/14/2016   Procedure: ESOPHAGOGASTRODUODENOSCOPY (EGD) WITH PROPOFOL;  Surgeon: Lucilla Lame, MD;  Location: [place];  Service: Endoscopy;  Laterality: N/A;  . TUBAL LIGATION  Medical History: Past Medical History:  Diagnosis Date  . Anxiety   . Aortic aneurysm ([place])   . Depression   . Emphysema of lung ([place])    "mild"  . GERD (gastroesophageal reflux disease)   . Headache    daily - stress  . Herpes   . Hyperlipidemia   . Insomnia   . Osteoporosis   . Vertigo    1 episode - approx 2010   . Weakness of both legs    intermittent    Family History: Family History  Problem Relation Age of Onset  . Leukemia Mother   . Aneurysm Other     Social History   Socioeconomic History  .  Marital status: Divorced    Spouse name: Not on file  . Number of children: Not on file  . Years of education: Not on file  . Highest education level: Not on file  Occupational History  . Not on file  Social Needs  . Financial resource strain: Not on file  . Food insecurity:    Worry: Not on file    Inability: Not on file  . Transportation needs:    Medical: Not on file    Non-medical: Not on file  Tobacco Use  . Smoking status: Current Every Day Smoker    Packs/day: 1.00    Years: 30.00    Pack years: 30.00    Types: Cigarettes  . Smokeless tobacco: Never Used  Substance and Sexual Activity  . Alcohol use: No  . Drug use: Not on file  . Sexual activity: Not on file  Lifestyle  . Physical activity:    Days per week: Not on file    Minutes per session: Not on file  . Stress: Not on file  Relationships  . Social connections:    Talks on phone: Not on file    Gets together: Not on file    Attends religious service: Not on file    Active member of club or organization: Not on file    Attends meetings of clubs or organizations: Not on file    Relationship status: Not on file  . Intimate partner violence:    Fear of current or ex partner: Not on file    Emotionally abused: Not on file    Physically abused: Not on file    Forced sexual activity: Not on file  Other Topics Concern  . Not on file  Social History Narrative  . Not on file      Review of Systems  Constitutional: Negative for activity change, chills, fatigue and unexpected weight change.  HENT: Positive for congestion, postnasal drip, rhinorrhea, sinus pressure, sinus pain and sore throat. Negative for sneezing.   Eyes: Negative.  Negative for redness.  Respiratory: Positive for cough. Negative for chest tightness, shortness of breath and wheezing.   Cardiovascular: Negative for chest pain and palpitations.  Gastrointestinal: Positive for abdominal distention, abdominal pain and constipation. Negative for  diarrhea, nausea and vomiting.       Level of pain improved since she started taking pantoprazole.  Endocrine: Negative for cold intolerance, heat intolerance, polydipsia, polyphagia and polyuria.       Well controlled thyroid disease  Genitourinary: Negative.  Negative for dysuria and frequency.  Musculoskeletal: Positive for arthralgias. Negative for back pain, joint swelling and neck pain.       Bilateral wrist pain, stretching into the thumb and first two fingers of her hands .  Skin: Negative for rash.  Allergic/Immunologic: Negative for environmental allergies.  Neurological: Negative for dizziness, tremors, numbness and headaches.  Hematological: Negative for adenopathy. Does not bruise/bleed easily.  Psychiatric/Behavioral: Positive for sleep disturbance. Negative for behavioral problems (Depression) and suicidal ideas. The patient is nervous/anxious.     Today's Vitals   08/26/18 1126  BP: 124/78  Pulse: 73  Resp: 16  SpO2: 97%  Weight: 144 lb (65.3 kg)  Height: 5\' 7"  (1.702 m)    Physical Exam  Constitutional: She is oriented to person, place, and time. She appears well-developed and well-nourished. No distress.  HENT:  Head: Normocephalic and atraumatic.  Right Ear: Tympanic membrane is erythematous and bulging.  Left Ear: Tympanic membrane is erythematous and bulging.  Nose: Rhinorrhea present. Right sinus exhibits maxillary sinus tenderness and frontal sinus tenderness. Left sinus exhibits maxillary sinus tenderness and frontal sinus tenderness.  Mouth/Throat: Posterior oropharyngeal erythema present. No oropharyngeal exudate.  Eyes: Pupils are equal, round, and reactive to light. EOM are normal.  Neck: Normal range of motion. Neck supple. No JVD present. Carotid bruit is not present. No tracheal deviation present. No thyromegaly present.  Cardiovascular: Normal rate, regular rhythm and normal heart sounds. Exam reveals no gallop and no friction rub.  No murmur  heard. Pulmonary/Chest: Effort normal and breath sounds normal. No respiratory distress. She has no wheezes. She has no rales. She exhibits no tenderness.  Abdominal: Soft. Bowel sounds are normal. She exhibits distension. There is tenderness in the left upper quadrant and left lower quadrant.  Musculoskeletal: Normal range of motion.  Bilateral wrist tenderness with tingling when the thumb or first two fingers are palpated. ROM and strength are currently intact.   Lymphadenopathy:    She has no cervical adenopathy.  Neurological: She is alert and oriented to person, place, and time. No cranial nerve deficit.  Skin: Skin is warm and dry. Capillary refill takes less than 2 seconds. She is not diaphoretic.  Psychiatric: She has a normal mood and affect. Her behavior is normal. Judgment and thought content normal.  Nursing note and vitals reviewed.  Assessment/Plan:  1. Acute upper respiratory infection Start augmentin 875mg  bid for 10 days. Rest and increase fluids. Use OTC medication as needed and as indicated to alleviate symptoms.  - amoxicillin-clavulanate (AUGMENTIN) 875-125 MG tablet; Take 1 tablet by mouth 2 (two) times daily.  Dispense: 20 tablet; Refill: 0  2. Carpal tunnel syndrome on both sides Start meloxicam 7.5mg  daily. Written rx for bilateral carpal tunnel splints given. Instructed patient to wear at night. Will reassess at next visit.  - meloxicam (MOBIC) 7.5 MG tablet; Take 1 tablet (7.5 mg total) by mouth daily.  Dispense: 30 tablet; Refill: 3  3. Chronic idiopathic constipation Will do trial of amitiza 47mcg twice daily. Samples provided and new prescription was sent to her pharmacy.  - lubiprostone (AMITIZA) 8 MCG capsule; Take 1 capsule (8 mcg total) by mouth 2 (two) times daily with a meal.  Dispense: 60 capsule; Refill: 3  4. Primary insomnia May continue ambien 10mg  at bedtime as needed. New prescription provided today.  - zolpidem (AMBIEN) 10 MG tablet; Take 1  tablet (10 mg total) by mouth at bedtime.  Dispense: 30 tablet; Refill: 3  5. Generalized anxiety disorder May continue alprazolam 0.25mg  daily as needed for acute anxiety. New prescription sent to her pharmacy today.  - ALPRAZolam (XANAX) 0.25 MG tablet; Take 1 tablet (0.25 mg total) by mouth daily as needed for anxiety. for anxiety  Dispense: 30 tablet; Refill: 3  General Counseling: graclynn vanantwerp understanding of the findings of todays visit and agrees with plan of treatment. I have discussed any further diagnostic evaluation that may be needed or ordered today. We also reviewed her medications today. she has been encouraged to call the office with any questions or concerns that should arise related to todays visit.   This patient was seen by [place] with Dr Lavera Guise as a part of collaborative care agreement  Meds ordered this encounter  Medications  . lubiprostone (AMITIZA) 8 MCG capsule    Sig: Take 1 capsule (8 mcg total) by mouth 2 (two) times daily with a meal.    Dispense:  60 capsule    Refill:  3    Order Specific Question:   Supervising Provider    Answer:   Lavera Guise [place]  . zolpidem (AMBIEN) 10 MG tablet    Sig: Take 1 tablet (10 mg total) by mouth at bedtime.    Dispense:  30 tablet    Refill:  3    Order Specific Question:   Supervising Provider    Answer:   Lavera Guise [[id number]]  . ALPRAZolam (XANAX) 0.25 MG tablet    Sig: Take 1 tablet (0.25 mg total) by mouth daily as needed for anxiety. for anxiety    Dispense:  30 tablet    Refill:  3    Order Specific Question:   Supervising Provider    Answer:   Lavera Guise [[id number]]  . amoxicillin-clavulanate (AUGMENTIN) 875-125 MG tablet    Sig: Take 1 tablet by mouth 2 (two) times daily.    Dispense:  20 tablet    Refill:  0    Order Specific Question:   Supervising Provider    Answer:   Lavera Guise [[id number]]  . meloxicam (MOBIC) 7.5 MG tablet    Sig: Take 1 tablet (7.5 mg total) by  mouth daily.    Dispense:  30 tablet    Refill:  3    Order Specific Question:   Supervising Provider    Answer:   Lavera Guise [[id number]]    Time spent: 69 Minutes      Dr Lavera Guise Internal medicine

## 2018-08-27 DIAGNOSIS — G5603 Carpal tunnel syndrome, bilateral upper limbs: Secondary | ICD-10-CM | POA: Insufficient documentation

## 2018-08-27 DIAGNOSIS — J069 Acute upper respiratory infection, unspecified: Secondary | ICD-10-CM | POA: Insufficient documentation

## 2018-09-08 ENCOUNTER — Other Ambulatory Visit: Payer: Self-pay

## 2018-09-08 MED ORDER — PANTOPRAZOLE SODIUM 40 MG PO TBEC: 40.0000 mg | DELAYED_RELEASE_TABLET | Freq: Every day | ORAL | 1 refills | Status: DC

## 2018-09-16 ENCOUNTER — Ambulatory Visit (INDEPENDENT_AMBULATORY_CARE_PROVIDER_SITE_OTHER): Payer: BLUE CROSS/BLUE SHIELD

## 2018-09-16 ENCOUNTER — Encounter (INDEPENDENT_AMBULATORY_CARE_PROVIDER_SITE_OTHER): Payer: Self-pay | Admitting: Vascular Surgery

## 2018-09-16 ENCOUNTER — Ambulatory Visit (INDEPENDENT_AMBULATORY_CARE_PROVIDER_SITE_OTHER): Payer: BLUE CROSS/BLUE SHIELD | Admitting: Vascular Surgery

## 2018-09-16 VITALS — BP 115/78 | HR 67 | Resp 17 | Ht 67.0 in | Wt 143.0 lb

## 2018-09-16 DIAGNOSIS — F17210 Nicotine dependence, cigarettes, uncomplicated: Secondary | ICD-10-CM

## 2018-09-16 DIAGNOSIS — E785 Hyperlipidemia, unspecified: Secondary | ICD-10-CM

## 2018-09-16 DIAGNOSIS — I714 Abdominal aortic aneurysm, without rupture, unspecified: Secondary | ICD-10-CM

## 2018-09-16 DIAGNOSIS — F17200 Nicotine dependence, unspecified, uncomplicated: Secondary | ICD-10-CM

## 2018-09-16 DIAGNOSIS — R1013 Epigastric pain: Secondary | ICD-10-CM | POA: Diagnosis not present

## 2018-09-16 NOTE — Progress Notes (Signed)
MRN : [id number]  Joy Carpenter is a 67 y.o. ([birth year]) female who presents with chief complaint of  Chief Complaint  Patient presents with  . Follow-up    AAA u/s  .  History of Present Illness: Patient returns today in follow up of her abdominal aortic aneurysm.  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  She had no major changes or problems since her last visit.  Her abdominal pain is actually been better recently.  Her duplex today shows no growth with a maximal diameter of approximately 4.2 cm by duplex today.  She does continue to smoke.  Current Outpatient Medications  Medication Sig Dispense Refill  . ALPRAZolam (XANAX) 0.25 MG tablet Take 1 tablet (0.25 mg total) by mouth daily as needed for anxiety. for anxiety 30 tablet 3  . aspirin EC 81 MG tablet Take 81 mg by mouth daily.    Marland Kitchen levothyroxine (SYNTHROID, LEVOTHROID) 50 MCG tablet TAKE 1 TABLET BY MOUTH EVERY MORNING 30 MINUTES PRIOR TO EATING FOOD 90 tablet 3  . meloxicam (MOBIC) 7.5 MG tablet Take 1 tablet (7.5 mg total) by mouth daily. 30 tablet 3  . pantoprazole (PROTONIX) 40 MG tablet Take 1 tablet (40 mg total) by mouth daily. 90 tablet 1  . simvastatin (ZOCOR) 20 MG tablet TAKE 1 TABLET BY MOUTH EVERY DAY 90 tablet 3  . traMADol-acetaminophen (ULTRACET) 37.5-325 MG tablet Take 1 tablet by mouth 2 (two) times daily as needed. for pain  2  . zolpidem (AMBIEN) 10 MG tablet Take 1 tablet (10 mg total) by mouth at bedtime. 30 tablet 3  . amoxicillin-clavulanate (AUGMENTIN) 875-125 MG tablet Take 1 tablet by mouth 2 (two) times daily. (Patient not taking: Reported on 09/16/2018) 20 tablet 0  . lubiprostone (AMITIZA) 8 MCG capsule Take 1 capsule (8 mcg total) by mouth 2 (two) times daily with a meal. (Patient not taking: Reported on 09/16/2018) 60 capsule 3  . polyethylene glycol powder (GLYCOLAX/MIRALAX) powder AS DIRECTED FOR COLONIC PREP.  0  . valACYclovir (VALTREX) 500 MG tablet Take 1 tablet  (500 mg total) by mouth 2 (two) times daily. (Patient not taking: Reported on 09/16/2018) 60 tablet 2   No current facility-administered medications for this visit.     Past Medical History:  Diagnosis Date  . Anxiety   . Aortic aneurysm ([place])   . Carpal tunnel syndrome 2019  . Depression   . Emphysema of lung ([place])    "mild"  . GERD (gastroesophageal reflux disease)   . Headache    daily - stress  . Herpes   . Hyperlipidemia   . Insomnia   . Osteoporosis   . Vertigo    1 episode - approx 2010   . Weakness of both legs    intermittent    Past Surgical History:  Procedure Laterality Date  . COLONOSCOPY    . COLONOSCOPY WITH PROPOFOL N/A 06/14/2016   Procedure: COLONOSCOPY WITH PROPOFOL;  Surgeon: Lucilla Lame, MD;  Location: [facility] [facility];  Service: Endoscopy;  Laterality: N/A;  . ESOPHAGOGASTRODUODENOSCOPY (EGD) WITH PROPOFOL N/A 06/14/2016   Procedure: ESOPHAGOGASTRODUODENOSCOPY (EGD) WITH PROPOFOL;  Surgeon: Lucilla Lame, MD;  Location: [place];  Service: Endoscopy;  Laterality: N/A;  . TUBAL LIGATION      Social History  Substance Use Topics  . Smoking status: Current Every Day Smoker    Packs/day: 1.00    Years: 30.00    Types: Cigarettes  . Smokeless tobacco: Never  Used  . Alcohol use No     Family History Half brother with an aneurysms No bleeding or clotting disorders No porphyria. No autoimmune diseases  No Known Allergies   REVIEW OF SYSTEMS(Negative unless checked)  Constitutional: [] Weight loss[] Fever[] Chills Cardiac:[] Chest pain[] Chest pressure[] Palpitations [] Shortness of breath when laying flat [] Shortness of breath at rest [] Shortness of breath with exertion. Vascular: [] Pain in legs with walking[] Pain in legsat rest[] Pain in legs when laying flat [] Claudication [] Pain in feet when walking [] Pain in feet at rest [] Pain in feet when laying flat [] History of DVT [] Phlebitis  [] Swelling in legs [] Varicose veins [] Non-healing ulcers Pulmonary: [] Uses home oxygen [] Productive cough[] Hemoptysis [] Wheeze [x] COPD [] Asthma Neurologic: [] Dizziness [] Blackouts [] Seizures [] History of stroke [] History of TIA[] Aphasia [] Temporary blindness[] Dysphagia [] Weaknessor numbness in arms [] Weakness or numbnessin legs Musculoskeletal: [x] Arthritis [] Joint swelling [] Joint pain [] Low back pain Hematologic:[] Easy bruising[] Easy bleeding [] Hypercoagulable state [] Anemic  Gastrointestinal:[] Blood in stool[] Vomiting blood[x] Gastroesophageal reflux/heartburn[x] Abdominal pain Genitourinary: [] Chronic kidney disease [] Difficulturination [] Frequenturination [] Burning with urination[] Hematuria Skin: [] Rashes [] Ulcers [] Wounds Psychological: [] History of anxiety[] History of major depression.    Physical Examination  BP 115/78 (BP Location: Right Arm, Patient Position: Sitting)   Pulse 67   Resp 17   Ht 5\' 7"  (1.702 m)   Wt 143 lb (64.9 kg)   BMI 22.40 kg/m  Gen:  WD/WN, NAD. Appears younger than stated age. Head: [place]/AT, No temporalis wasting. Ear/Nose/Throat: Hearing grossly intact, nares w/o erythema or drainage Eyes: Conjunctiva clear. Sclera non-icteric Neck: Supple.  Trachea midline Pulmonary:  Good air movement, no use of accessory muscles.  Cardiac: RRR, no JVD Vascular:  Vessel Right Left  Radial Palpable Palpable                                   Gastrointestinal: soft, non-tender/non-distended. Mildly increased aortic impulse Musculoskeletal: M/S 5/5 throughout.  No deformity or atrophy.  Neurologic: Sensation grossly intact in extremities.  Symmetrical.  Speech is fluent.  Psychiatric: Judgment intact, Mood & affect appropriate for pt's clinical situation. Dermatologic: No rashes or ulcers noted.  No cellulitis or open wounds.       Labs No results found for this or any  previous visit (from the past 2160 hour(s)).  Radiology No results found.  Assessment/Plan Hyperlipidemia lipid control important in reducing the progression of atherosclerotic disease. Continue statin therapy   Tobacco use disorder We had a discussion for approximately3-44minutes regarding the absolute need for smoking cessation due to the deleterious nature of tobacco on the vascular system, particularly with aneurysm growth in her case. We discussed the tobacco use would diminish patency of any intervention, and likely significantly worsen progressio of disease. We discussed multiple agents for quitting including replacement therapy or medications to reduce cravings such as Chantix. The patient voices their understanding of the importance of smoking cessation.   Abdominal pain, epigastric Although not impossible, this is less likely to be related to a stable aneurysm.  This has been better recently.  AAA (abdominal aortic aneurysm) without rupture (HCC) Her duplex today shows no growth with a maximal diameter of approximately 4.2 cm by duplex today.  Smoking cessation again was stressed.  Return to clinic in 6 months with follow-up study.    Leotis Pain, MD  09/16/2018 9:02 AM    This note was created with Dragon medical transcription system.  Any errors from dictation are purely unintentional

## 2018-09-16 NOTE — Assessment & Plan Note (Signed)
Her duplex today shows no growth with a maximal diameter of approximately 4.2 cm by duplex today.  Smoking cessation again was stressed.  Return to clinic in 6 months with follow-up study.

## 2018-09-16 NOTE — Patient Instructions (Signed)
Abdominal Aortic Aneurysm Blood pumps away from the heart through tubes (blood vessels) called arteries. Aneurysms are weak or damaged places in the wall of an artery. It bulges out like a balloon. An abdominal aortic aneurysm happens in the main artery of the body (aorta). It can burst or tear, causing bleeding inside the body. This is an emergency. It needs treatment right away. What are the causes? The exact cause is unknown. Things that could cause this problem include:  Fat and other substances building up in the lining of a tube.  Swelling of the walls of a blood vessel.  Certain tissue diseases.  Belly (abdominal) trauma.  An infection in the main artery of the body.  What increases the risk? There are things that make it more likely for you to have an aneurysm. These include:  Being over the age of 65 years old.  Having high blood pressure (hypertension).  Being a female.  Being white.  Being very overweight (obese).  Having a family history of aneurysm.  Using tobacco products.  What are the signs or symptoms? Symptoms depend on the size of the aneurysm and how fast it grows. There may not be symptoms. If symptoms occur, they can include:  Pain (belly, side, lower back, or groin).  Feeling full after eating a small amount of food.  Feeling sick to your stomach (nauseous), throwing up (vomiting), or both.  Feeling a lump in your belly that feels like it is beating (pulsating).  Feeling like you will pass out (faint).  How is this treated?  Medicine to control blood pressure and pain.  Imaging tests to see if the aneurysm gets bigger.  Surgery. How is this prevented? To lessen your chance of getting this condition:  Stop smoking. Stop chewing tobacco.  Limit or avoid alcohol.  Keep your blood pressure, blood sugar, and cholesterol within normal limits.  Eat less salt.  Eat foods low in saturated fats and cholesterol. These are found in animal and  whole dairy products.  Eat more fiber. Fiber is found in whole grains, vegetables, and fruits.  Keep a healthy weight.  Stay active and exercise often.  This information is not intended to replace advice given to you by your health care provider. Make sure you discuss any questions you have with your health care provider. Document Released: 01/19/2013 Document Revised: 03/01/2016 Document Reviewed: 10/24/2012 Elsevier Interactive Patient Education  2017 Elsevier Inc.  

## 2018-12-03 IMAGING — CT CT CTA ABD/PEL W/CM AND/OR W/O CM
2 of 7 series · 11 of 46 positions shown, 12 images · IV contrast (iopamidol)
Comparison: CT abdomen pelvis-05/02/2016; chest CT-05/01/2017;
05/16/2016

CLINICAL DATA: Evaluate abdominal aortic aneurysm prior to
potential stenting.

EXAM:
CTA ABDOMEN AND PELVIS WITH CONTRAST
TECHNIQUE: Multidetector CT imaging of the abdomen and pelvis was performed
using the standard protocol during bolus administration of
intravenous contrast. Multiplanar reconstructed images and MIPs were
obtained and reviewed to evaluate the vascular anatomy.
CONTRAST:  100mL [id number]-BZA IOPAMIDOL ([id number]-BZA) INJECTION 76%

[Series 4: axial arterial · axial · arterial · 0.67mm/px · z∈[-889,-484]mm · 8 of 157 slices shown, 9 images]
[im 11/157  soft-tissue]
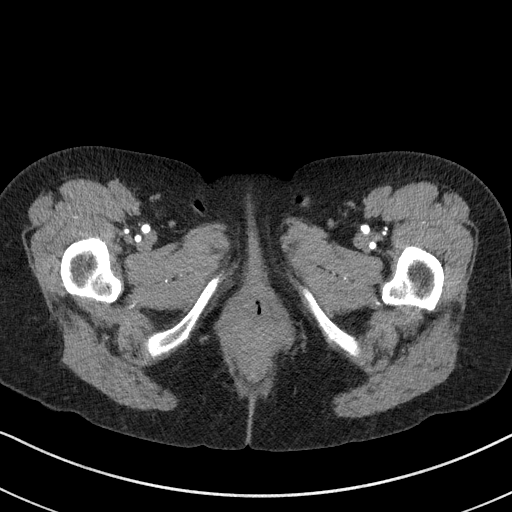
[im 11/157  bone]
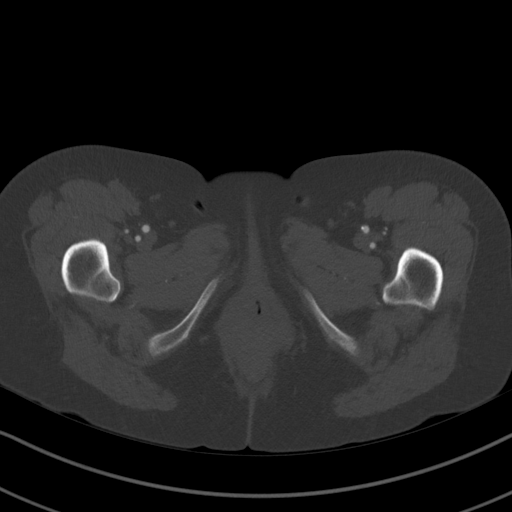
[im 32/157  soft-tissue]
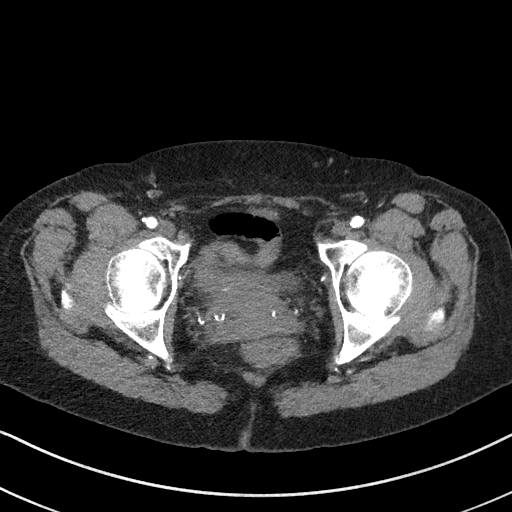
[im 53/157  soft-tissue]
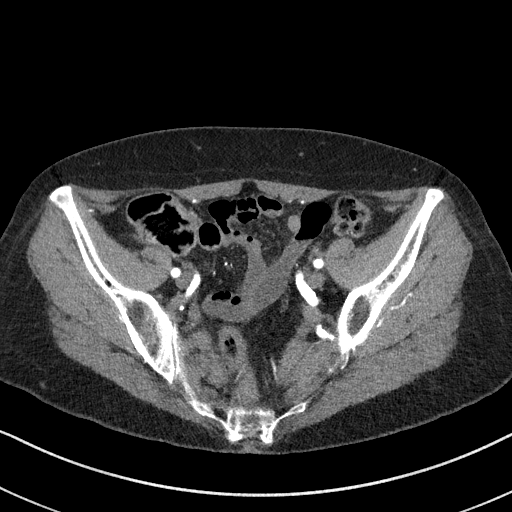
[im 73/157  soft-tissue]
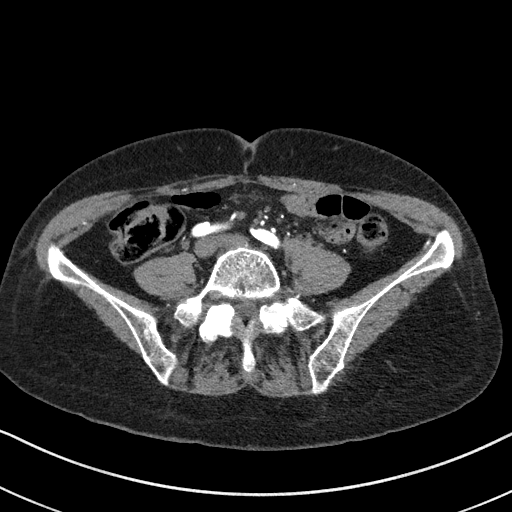
[im 84/157  soft-tissue]
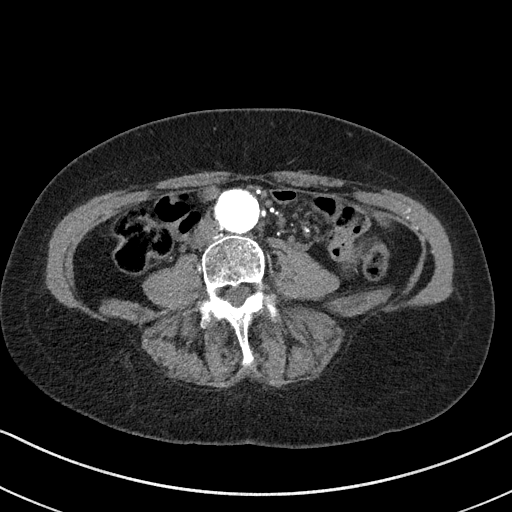
[im 105/157  soft-tissue]
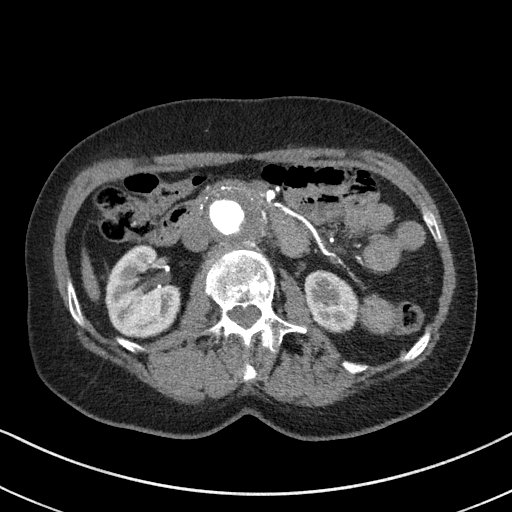
[im 125/157  soft-tissue]
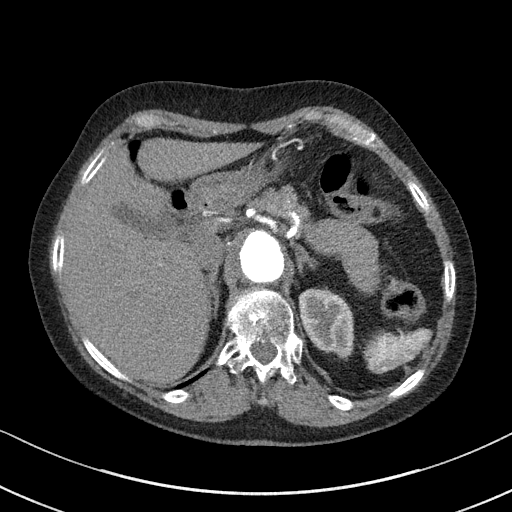
[im 146/157  soft-tissue]
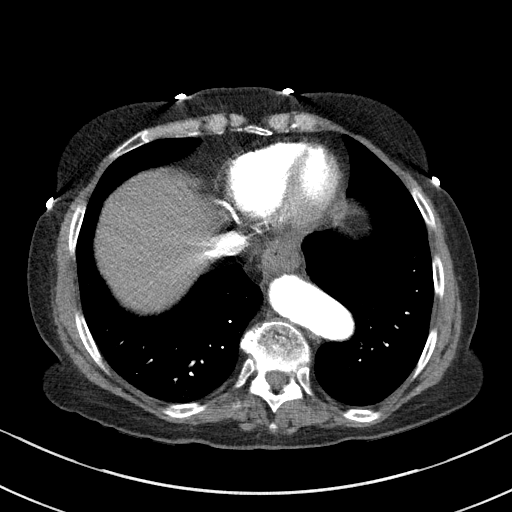

[Series 6: coronals · coronal · 0.65mm/px · 3 of 114 slices shown]
[im 29/114  soft-tissue]
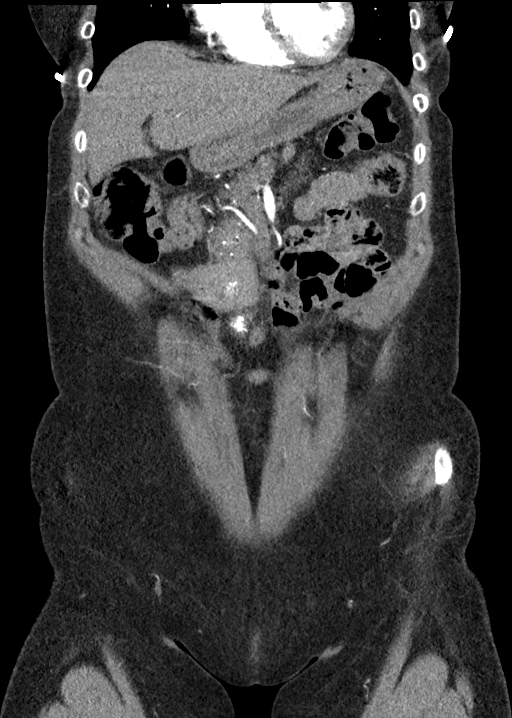
[im 57/114  soft-tissue]
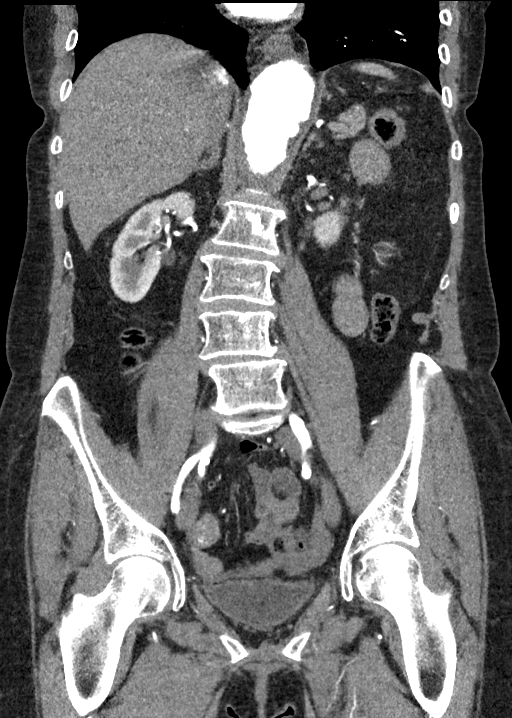
[im 85/114  soft-tissue]
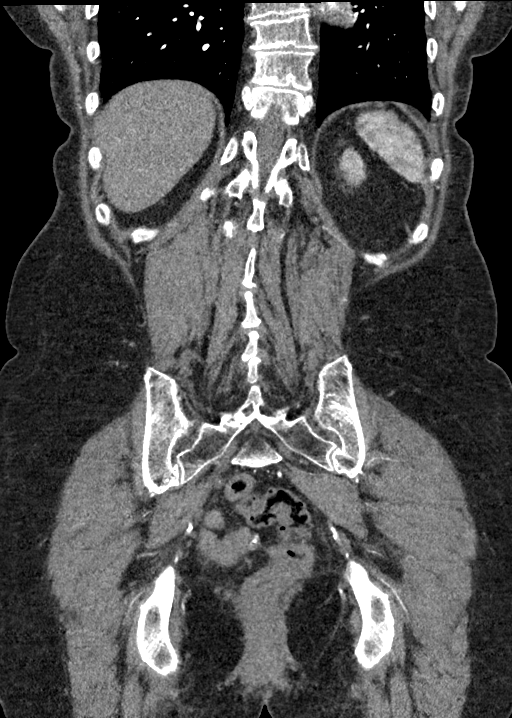

[11 of 46 positions shown; findings below may reference images not displayed]

FINDINGS: VASCULAR

Aorta: There is fusiform aneurysmal dilatation of the distal aspect
of the descending thoracic aorta measuring approximately 4.1 cm in
maximal diameter cranial to the diaphragmatic hiatus (axial image
20, series 4), unchanged compared to abdominal CT performed [DATE].

The thoracic aorta tapers to a near normal caliber for a length of
approximately 3.4 cm at the takeoff of the level of the celiac
artery and SMA.

There is a tri lobed infrarenal abdominal aortic aneurysm with
dominant cranial component measuring approximately 4.0 x 4.4 x
cm as measured in greatest oblique short axis axial (image 49,
series 4), coronal (image 43, series 6) and sagittal (image 79,
series 7) dimensions respectively, unchanged to minimally increased
in size compared to the [DATE] examination, previously, 4.0 x 3.9 x
4.0 cm.

Mid component of the tri lobed infrarenal abdominal aortic aneurysm
measures approximately 2.9 x3.0 x 3.0 cm as measured in greatest
oblique short axis axial (image 62, series 4), coronal (image 38,
series 6) and sagittal (image 77, series 7) dimensions respectively,
increased in size in the interval, previously, 2.7 x 2.7 x 2.9 cm.

The caudal component of the tri lobed infrarenal abdominal aortic
aneurysm measures approximately 3.2 x 3.3 x 3.0 cm as measured in
greatest oblique short axis axial (image 72, series 4), coronal
(image 38, series 6) and sagittal (image 80, series 7) dimensions
respectively, increased in size in the interval, previously, 3.0 x
2.9 x 2.8 cm.

There is a large amount of crescentic mural thrombus within the
dominant cranial component of the aneurysm, unchanged to minimally
progressed compared to the [DATE] examination. No abdominal aortic
dissection or periaortic stranding.

The cranial aspect of the infrarenal abdominal aortic aneurysm
originates at the takeoff of the bilateral renal arteries and
extends to the level of the aortic bifurcation without extension
into either common iliac arteries.

Celiac: Irregular mixed calcified and noncalcified atherosclerotic
plaque involves the origin of the celiac artery, not definitely
resulting in a hemodynamically significant stenosis. Conventional
branching pattern.

SMA: Minimal amount of mixed calcified and noncalcified
atherosclerotic plaque involving the origin the SMA, not resulting
in hemodynamically significant stenosis. Conventional branching
pattern. The distal tributaries the SMA appear widely patent without
discrete intraluminal filling defect to suggest distal embolism.

Renals: Solitary bilaterally. There is eccentric calcified and
noncalcified atherosclerotic plaque involving the origin of the
bilateral renal arteries, approaching 50% luminal narrowing
bilaterally however there is no evidence of end organ ischemia or
asymmetric renal atrophy. No vessel irregularity to suggest FMD.

IMA: Remains patent.

Inflow: There is a minimal amount of predominantly calcified though
slightly irregular atherosclerotic plaque within the bilateral
normal caliber common iliac arteries, not resulting in
hemodynamically significant stenosis. The bilateral internal iliac
arteries are heavily disease though patent of normal caliber. The
bilateral external iliac arteries are widely patent without
hemodynamically significant stenosis.

Proximal Outflow: Eccentric calcified and noncalcified
atherosclerotic plaque involving the bilateral common femoral
arteries, left greater than right, not definitely resulting in
hemodynamically significant stenosis. The imaged portions of the
bilateral superficial femoral and deep femoral arteries appear
widely patent throughout their imaged course.

Veins: The IVC and pelvic venous system appears widely patent.

Review of the MIP images confirms the above findings.

NON-VASCULAR

Lower chest: Limited visualization of the lower thorax demonstrates
minimal right basilar subsegmental atelectasis. No focal airspace
opacities. No pleural effusion.

Re-demonstrated approximately 4 mm left lower lobe (image 17, series
5) and an approximately 0.6 cm right lower lobe (image 32, series 5)
pulmonary nodules, both of which appear similar to the [DATE]
examination. The approximately 0.5 cm nodule within the left
costophrenic angle (image 24, series 5, is unchanged compared to the
[DATE] examination.

Normal heart size.  No pericardial effusion.

Hepatobiliary: Normal hepatic contour. No discrete hepatic lesions.
Normal appearance of the gallbladder given degree distention. No
intra extrahepatic biliary duct dilatation. No ascites.

Pancreas: Normal appearance of the pancreas

Spleen: Normal appearance of the spleen

Adrenals/Urinary Tract: There is symmetric enhancement and excretion
of the bilateral kidneys. No definite renal stones this postcontrast
examination. No discrete renal lesions. No urine obstruction or
perinephric stranding.

Normal appearance the bilateral adrenal glands.

Normal appearance of the urinary bladder given degree distention.

Stomach/Bowel: Rather extensive colonic diverticulosis without
evidence of superimposed acute diverticulitis. Moderate to large
colonic stool burden without evidence of enteric obstruction. Normal
appearance of the terminal ileum and retrocecal appendix. No
pneumoperitoneum, pneumatosis or portal venous gas.

Lymphatic: No bulky retroperitoneal, mesenteric, pelvic or inguinal
lymphadenopathy.

Reproductive: Normal appearance of the pelvic organs for age. No
discrete adnexal lesion. No free fluid in the pelvic cul-de-sac.

Other: Regional soft tissues appear normal.

Musculoskeletal: Increased sclerosis involving the left side of L2
vertebral body is favored to represent healing associated with the
mild to moderate presumably degenerative scoliosis of the
thoracolumbar spine as the left-side of the L2 vertebral body is
located at the central concavity of one of the scoliotic curvatures.
No aggressive features associated with this increased sclerosis,
specifically, no periostitis or cortical destruction and thus is
favored to be of benign etiology. No definite acute or aggressive
osseous abnormalities. Mild-to-moderate multilevel lumbar spine DDD,
likely worse at L4-L5 and L5-S1 with disc space height loss,
endplate irregularity and sclerosis.
IMPRESSION: VASCULAR

1. Interval increase in size tried lobed infrarenal abdominal aortic
aneurysm with dominant cranial component measuring 4.4 cm in maximal
diameter, previously, 4.0 cm. There is a moderate to large amount of
crescentic mural thrombus within the dominant cranial component of
the trial lobe aneurysm, progressed compared to the [DATE]
examination though not resulting in hemodynamically significant
stenosis. No abdominal aortic dissection or periaortic stranding.
Aortic aneurysm NOS (9HG2V-XOU.X).
2. Grossly unchanged fusiform aneurysmal dilatation of the imaged
distal aspect of the thoracic aorta measuring approximately 4.1 cm
cranial to the diaphragmatic hiatus.
3. Potential hemodynamically significant narrowing involving the
origin the bilateral renal arteries without associated renal atrophy
or stigmata end organ ischemia.
4.  Aortic Atherosclerosis (9HG2V-V1M.M).

NON-VASCULAR

1. Incidentally imaged bilateral lower lobe pulmonary nodules appear
unchanged since at least the [DATE] examination. Stability for
nearly 2 years is indicative of benign etiology.

## 2018-12-05 ENCOUNTER — Other Ambulatory Visit: Payer: Self-pay | Admitting: Nurse Practitioner

## 2018-12-05 DIAGNOSIS — M150 Primary generalized (osteo)arthritis: Secondary | ICD-10-CM

## 2018-12-05 MED ORDER — TRAMADOL-ACETAMINOPHEN 37.5-325 MG PO TABS: 1.0000 | ORAL_TABLET | Freq: Two times a day (BID) | ORAL | 0 refills | Status: DC | PRN

## 2018-12-05 NOTE — Progress Notes (Signed)
Renewed prescription for tramadol/APAP 37.5/325mg  tablets - #30 with no refills. Will give additional refills at her next follow up visit.

## 2018-12-10 ENCOUNTER — Telehealth: Payer: Self-pay | Admitting: Nurse Practitioner

## 2018-12-10 NOTE — Telephone Encounter (Signed)
Members insurance will not cover Tramadol for more then 90 days in a year .

## 2018-12-10 NOTE — Telephone Encounter (Signed)
Prior authorization for medication tramodol, per insurance qnty limit has been exceeded on coverage of medication . Patient will have to pay out of pocket if she wants to continue with medication, patient was informed to ask what paying for the medication would cost or to use good rx for this

## 2018-12-22 ENCOUNTER — Other Ambulatory Visit: Payer: Self-pay

## 2018-12-22 ENCOUNTER — Ambulatory Visit (INDEPENDENT_AMBULATORY_CARE_PROVIDER_SITE_OTHER): Payer: BLUE CROSS/BLUE SHIELD | Admitting: Adult Health

## 2018-12-22 ENCOUNTER — Encounter: Payer: Self-pay | Admitting: Adult Health

## 2018-12-22 VITALS — BP 126/87 | HR 79 | Temp 97.8°F | Resp 16 | Ht 67.0 in | Wt 143.0 lb

## 2018-12-22 DIAGNOSIS — F17200 Nicotine dependence, unspecified, uncomplicated: Secondary | ICD-10-CM | POA: Diagnosis not present

## 2018-12-22 DIAGNOSIS — J0110 Acute frontal sinusitis, unspecified: Secondary | ICD-10-CM

## 2018-12-22 DIAGNOSIS — R0982 Postnasal drip: Secondary | ICD-10-CM | POA: Diagnosis not present

## 2018-12-22 MED ORDER — AMOXICILLIN-POT CLAVULANATE 875-125 MG PO TABS: 1.0000 | ORAL_TABLET | Freq: Two times a day (BID) | ORAL | 0 refills | Status: DC

## 2018-12-22 NOTE — Progress Notes (Signed)
[facility] [place], [place] [address]  Internal MEDICINE  Office Visit Note  Patient Name: Joy Carpenter  [id number]  [id number]  Date of Service: 12/22/2018  Chief Complaint  Patient presents with  . Cough  . Sinusitis     HPI Pt is here for a sick visit. Pt reports sinus pain and pressures x a couple days.  Tinitis, and fatigue since Friday.  She reports she worked 78 hours last week and thought she was just exhausted.  Pt reports she began to feel worse.  She denies fever or chills.     Current Medication:  Outpatient Encounter Medications as of 12/22/2018  Medication Sig Note  . ALPRAZolam (XANAX) 0.25 MG tablet Take 1 tablet (0.25 mg total) by mouth daily as needed for anxiety. for anxiety   . aspirin EC 81 MG tablet Take 81 mg by mouth daily.   Marland Kitchen levothyroxine (SYNTHROID, LEVOTHROID) 50 MCG tablet TAKE 1 TABLET BY MOUTH EVERY MORNING 30 MINUTES PRIOR TO EATING FOOD   . lubiprostone (AMITIZA) 8 MCG capsule Take 1 capsule (8 mcg total) by mouth 2 (two) times daily with a meal.   . meloxicam (MOBIC) 7.5 MG tablet Take 1 tablet (7.5 mg total) by mouth daily.   . pantoprazole (PROTONIX) 40 MG tablet Take 1 tablet (40 mg total) by mouth daily.   . polyethylene glycol powder (GLYCOLAX/MIRALAX) powder AS DIRECTED FOR COLONIC PREP. 06/08/2016: Received from: External Pharmacy  . simvastatin (ZOCOR) 20 MG tablet TAKE 1 TABLET BY MOUTH EVERY DAY   . traMADol-acetaminophen (ULTRACET) 37.5-325 MG tablet Take 1 tablet by mouth 2 (two) times daily as needed. for pain   . valACYclovir (VALTREX) 500 MG tablet Take 1 tablet (500 mg total) by mouth 2 (two) times daily.   Marland Kitchen zolpidem (AMBIEN) 10 MG tablet Take 1 tablet (10 mg total) by mouth at bedtime.   Marland Kitchen amoxicillin-clavulanate (AUGMENTIN) 875-125 MG tablet Take 1 tablet by mouth 2 (two) times daily. (Patient not taking: Reported on 12/22/2018)   . amoxicillin-clavulanate (AUGMENTIN) 875-125 MG tablet Take 1 tablet  by mouth 2 (two) times daily.    No facility-administered encounter medications on file as of 12/22/2018.       Medical History: Past Medical History:  Diagnosis Date  . Anxiety   . Aortic aneurysm ([place])   . Carpal tunnel syndrome 2019  . Depression   . Emphysema of lung ([place])    "mild"  . GERD (gastroesophageal reflux disease)   . Headache    daily - stress  . Herpes   . Hyperlipidemia   . Insomnia   . Osteoporosis   . Vertigo    1 episode - approx 2010   . Weakness of both legs    intermittent     Vital Signs: BP 126/87   Pulse 79   Temp 97.8 F (36.6 C)   Resp 16   Ht 5\' 7"  (1.702 m)   Wt 143 lb (64.9 kg)   SpO2 98%   BMI 22.40 kg/m    Review of Systems  Constitutional: Negative for chills, fatigue and unexpected weight change.  HENT: Positive for ear pain, postnasal drip, sinus pressure and sinus pain. Negative for congestion, rhinorrhea, sneezing and sore throat.   Eyes: Negative for photophobia, pain and redness.  Respiratory: Negative for cough, chest tightness and shortness of breath.   Cardiovascular: Negative for chest pain and palpitations.  Gastrointestinal: Negative for abdominal pain, constipation, diarrhea, nausea and vomiting.  Endocrine: Negative.   Genitourinary: Negative for dysuria and frequency.  Musculoskeletal: Negative for arthralgias, back pain, joint swelling and neck pain.  Skin: Negative for rash.  Allergic/Immunologic: Negative.   Neurological: Negative for tremors and numbness.  Hematological: Negative for adenopathy. Does not bruise/bleed easily.  Psychiatric/Behavioral: Negative for behavioral problems and sleep disturbance. The patient is not nervous/anxious.     Physical Exam Vitals signs and nursing note reviewed.  Constitutional:      General: She is not in acute distress.    Appearance: She is well-developed. She is not diaphoretic.  HENT:     Head: Normocephalic and atraumatic.     Mouth/Throat:     Pharynx: No  oropharyngeal exudate.  Eyes:     Pupils: Pupils are equal, round, and reactive to light.  Neck:     Musculoskeletal: Normal range of motion and neck supple.     Thyroid: No thyromegaly.     Vascular: No JVD.     Trachea: No tracheal deviation.  Cardiovascular:     Rate and Rhythm: Normal rate and regular rhythm.     Heart sounds: Normal heart sounds. No murmur. No friction rub. No gallop.   Pulmonary:     Effort: Pulmonary effort is normal. No respiratory distress.     Breath sounds: Normal breath sounds. No wheezing or rales.  Chest:     Chest wall: No tenderness.  Abdominal:     Palpations: Abdomen is soft.     Tenderness: There is no abdominal tenderness. There is no guarding.  Musculoskeletal: Normal range of motion.  Lymphadenopathy:     Cervical: No cervical adenopathy.  Skin:    General: Skin is warm and dry.  Neurological:     Mental Status: She is alert and oriented to person, place, and time.     Cranial Nerves: No cranial nerve deficit.  Psychiatric:        Behavior: Behavior normal.        Thought Content: Thought content normal.        Judgment: Judgment normal.     Assessment/Plan: 1. Acute non-recurrent frontal sinusitis Patient provided with course of Augmentin for sinus infection.  Instructed patient to call medications as prescribed.  Return to clinic if symptoms fail to improve. - amoxicillin-clavulanate (AUGMENTIN) 875-125 MG tablet; Take 1 tablet by mouth 2 (two) times daily.  Dispense: 14 tablet; Refill: 0  2. PND (post-nasal drip) Continue taking OTC medications for symptom management as discussed.   3. Nicotine dependence with current use Smoking cessation counseling: 1. Pt acknowledges the risks of long term smoking, she will try to quite smoking. 2. Options for different medications including nicotine products, chewing gum, patch etc, Wellbutrin and Chantix is discussed 3. Goal and date of compete cessation is discussed 4. Total time spent in  smoking cessation is 15 min.   General Counseling: aime carreras understanding of the findings of todays visit and agrees with plan of treatment. I have discussed any further diagnostic evaluation that may be needed or ordered today. We also reviewed her medications today. she has been encouraged to call the office with any questions or concerns that should arise related to todays visit.   No orders of the defined types were placed in this encounter.   Meds ordered this encounter  Medications  . amoxicillin-clavulanate (AUGMENTIN) 875-125 MG tablet    Sig: Take 1 tablet by mouth 2 (two) times daily.    Dispense:  14 tablet    Refill:  0    Time spent: 25 Minutes  This patient was seen by Orson Gear AGNP-C in Collaboration with Dr Lavera Guise as a part of collaborative care agreement.  Kendell Bane AGNP-C Internal Medicine

## 2018-12-26 ENCOUNTER — Other Ambulatory Visit: Payer: Self-pay

## 2018-12-26 ENCOUNTER — Encounter: Payer: Self-pay | Admitting: Nurse Practitioner

## 2018-12-26 ENCOUNTER — Ambulatory Visit (INDEPENDENT_AMBULATORY_CARE_PROVIDER_SITE_OTHER): Payer: BLUE CROSS/BLUE SHIELD | Admitting: Nurse Practitioner

## 2018-12-26 VITALS — BP 130/69 | HR 68 | Temp 97.6°F | Resp 16 | Ht 66.0 in | Wt 145.0 lb

## 2018-12-26 DIAGNOSIS — G5603 Carpal tunnel syndrome, bilateral upper limbs: Secondary | ICD-10-CM | POA: Diagnosis not present

## 2018-12-26 DIAGNOSIS — M150 Primary generalized (osteo)arthritis: Secondary | ICD-10-CM | POA: Diagnosis not present

## 2018-12-26 DIAGNOSIS — F5101 Primary insomnia: Secondary | ICD-10-CM | POA: Diagnosis not present

## 2018-12-26 DIAGNOSIS — Z1239 Encounter for other screening for malignant neoplasm of breast: Secondary | ICD-10-CM

## 2018-12-26 DIAGNOSIS — Z79899 Other long term (current) drug therapy: Secondary | ICD-10-CM | POA: Diagnosis not present

## 2018-12-26 DIAGNOSIS — F411 Generalized anxiety disorder: Secondary | ICD-10-CM

## 2018-12-26 DIAGNOSIS — L209 Atopic dermatitis, unspecified: Secondary | ICD-10-CM | POA: Insufficient documentation

## 2018-12-26 LAB — POCT URINE DRUG SCREEN
POC Amphetamine UR: NOT DETECTED
POC BENZODIAZEPINES UR: NOT DETECTED
POC Barbiturate UR: NOT DETECTED
POC Cocaine UR: NOT DETECTED
POC Ecstasy UR: NOT DETECTED
POC MARIJUANA UR: NOT DETECTED
POC Methadone UR: NOT DETECTED
POC Methamphetamine UR: NOT DETECTED
POC Opiate Ur: NOT DETECTED
POC Oxycodone UR: NOT DETECTED
POC PHENCYCLIDINE UR: NOT DETECTED
POC TRICYCLICS UR: NOT DETECTED

## 2018-12-26 MED ORDER — ALPRAZOLAM 0.25 MG PO TABS: 0.2500 mg | ORAL_TABLET | Freq: Every day | ORAL | 3 refills | Status: DC | PRN

## 2018-12-26 MED ORDER — TRAMADOL-ACETAMINOPHEN 37.5-325 MG PO TABS: 1.0000 | ORAL_TABLET | Freq: Two times a day (BID) | ORAL | 0 refills | Status: DC | PRN

## 2018-12-26 MED ORDER — MELOXICAM 15 MG PO TABS: 7.5000 mg | ORAL_TABLET | Freq: Every day | ORAL | 3 refills | Status: DC

## 2018-12-26 MED ORDER — TRIAMCINOLONE ACETONIDE 0.025 % EX CREA: 1.0000 "application " | TOPICAL_CREAM | Freq: Two times a day (BID) | CUTANEOUS | 2 refills | Status: DC

## 2018-12-26 MED ORDER — ZOLPIDEM TARTRATE 10 MG PO TABS: 10.0000 mg | ORAL_TABLET | Freq: Every day | ORAL | 3 refills | Status: DC

## 2018-12-26 NOTE — Progress Notes (Signed)
[facility] [place], [place] [address]  Internal MEDICINE  Office Visit Note  Patient Name: Joy Carpenter  [id number]  [id number]  Date of Service: 12/26/2018  Chief Complaint  Patient presents with  . Follow-up  . Depression  . Anxiety    The patient is here for follow up visit. Was seen Monday for symptoms consistent with upper respiratory infection. Currently on amoxicillin twice daily. Is feeling some better. Still has some ringing in her ears and nasal congestion on left side.  Having some left sided abdominal discomfort. 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 She also has chronic constipation. May also contribute to the abdominal pain.  Has itchy spot on the right side of her neck. Nothing there, just finds herself scratching at her skin sometimes.  Continues to have bilateral wrist pain. This pain goes up the inner aspect of the wrists, into the thumb and first finger. Worse at work when using her hands. ROM and strength are currently intact. She has noted swelling around the joint of middle finger of left hand which seems to be tender most of the time. Again, this got worse after she spent 78 hours at working opening boxes and stocking shelves. She does take meloxicam 7.5mg  daily. States this is not really helping. Has had log-time prescription for tramadol/APAP 37.5/325mg  tablets to take twice daily. We have had problems getting this filled through her insurance. She has found a coupon for #60 tablets through [place] which is available at [facility],  She also needs to have refills for her usual alprazolam and zolpidem. She does have very intermittent anxiety with difficulty sleeping. UDS performed today and is negative for all controlled substances, which is appropriate, as she takes low dose alprazolam only once daily and only if needed.        Current Medication: Outpatient Encounter Medications as of 12/26/2018   Medication Sig Note  . ALPRAZolam (XANAX) 0.25 MG tablet Take 1 tablet (0.25 mg total) by mouth daily as needed for anxiety. for anxiety   . amoxicillin-clavulanate (AUGMENTIN) 875-125 MG tablet Take 1 tablet by mouth 2 (two) times daily.   Marland Kitchen aspirin EC 81 MG tablet Take 81 mg by mouth daily.   Marland Kitchen levothyroxine (SYNTHROID, LEVOTHROID) 50 MCG tablet TAKE 1 TABLET BY MOUTH EVERY MORNING 30 MINUTES PRIOR TO EATING FOOD   . lubiprostone (AMITIZA) 8 MCG capsule Take 1 capsule (8 mcg total) by mouth 2 (two) times daily with a meal.   . meloxicam (MOBIC) 15 MG tablet Take 0.5 tablets (7.5 mg total) by mouth daily.   . pantoprazole (PROTONIX) 40 MG tablet Take 1 tablet (40 mg total) by mouth daily.   . polyethylene glycol powder (GLYCOLAX/MIRALAX) powder AS DIRECTED FOR COLONIC PREP. 06/08/2016: Received from: External Pharmacy  . simvastatin (ZOCOR) 20 MG tablet TAKE 1 TABLET BY MOUTH EVERY DAY   . traMADol-acetaminophen (ULTRACET) 37.5-325 MG tablet Take 1 tablet by mouth 2 (two) times daily as needed.   . valACYclovir (VALTREX) 500 MG tablet Take 1 tablet (500 mg total) by mouth 2 (two) times daily.   Marland Kitchen zolpidem (AMBIEN) 10 MG tablet Take 1 tablet (10 mg total) by mouth at bedtime.   . [DISCONTINUED] ALPRAZolam (XANAX) 0.25 MG tablet Take 1 tablet (0.25 mg total) by mouth daily as needed for anxiety. for anxiety   . [DISCONTINUED] amoxicillin-clavulanate (AUGMENTIN) 875-125 MG tablet Take 1 tablet by mouth 2 (two) times daily.   . [  DISCONTINUED] meloxicam (MOBIC) 7.5 MG tablet Take 1 tablet (7.5 mg total) by mouth daily.   . [DISCONTINUED] traMADol-acetaminophen (ULTRACET) 37.5-325 MG tablet Take 1 tablet by mouth 2 (two) times daily as needed. for pain   . [DISCONTINUED] zolpidem (AMBIEN) 10 MG tablet Take 1 tablet (10 mg total) by mouth at bedtime.   . triamcinolone (KENALOG) 0.025 % cream Apply 1 application topically 2 (two) times daily.    No facility-administered encounter medications on file as  of 12/26/2018.     Surgical History: Past Surgical History:  Procedure Laterality Date  . COLONOSCOPY    . COLONOSCOPY WITH PROPOFOL N/A 06/14/2016   Procedure: COLONOSCOPY WITH PROPOFOL;  Surgeon: Lucilla Lame, MD;  Location: [place];  Service: Endoscopy;  Laterality: N/A;  . ESOPHAGOGASTRODUODENOSCOPY (EGD) WITH PROPOFOL N/A 06/14/2016   Procedure: ESOPHAGOGASTRODUODENOSCOPY (EGD) WITH PROPOFOL;  Surgeon: Lucilla Lame, MD;  Location: [place];  Service: Endoscopy;  Laterality: N/A;  . TUBAL LIGATION      Medical History: Past Medical History:  Diagnosis Date  . Anxiety   . Aortic aneurysm ([place])   . Carpal tunnel syndrome 2019  . Depression   . Emphysema of lung ([place])    "mild"  . GERD (gastroesophageal reflux disease)   . Headache    daily - stress  . Herpes   . Hyperlipidemia   . Insomnia   . Osteoporosis   . Vertigo    1 episode - approx 2010   . Weakness of both legs    intermittent    Family History: Family History  Problem Relation Age of Onset  . Leukemia Mother   . Aneurysm Other     Social History   Socioeconomic History  . Marital status: Divorced    Spouse name: Not on file  . Number of children: Not on file  . Years of education: Not on file  . Highest education level: Not on file  Occupational History  . Not on file  Social Needs  . Financial resource strain: Not on file  . Food insecurity:    Worry: Not on file    Inability: Not on file  . Transportation needs:    Medical: Not on file    Non-medical: Not on file  Tobacco Use  . Smoking status: Current Every Day Smoker    Packs/day: 1.00    Years: 30.00    Pack years: 30.00    Types: Cigarettes  . Smokeless tobacco: Never Used  Substance and Sexual Activity  . Alcohol use: No  . Drug use: Not on file  . Sexual activity: Not on file  Lifestyle  . Physical activity:    Days per week: Not on file    Minutes per session: Not on file  . Stress: Not on file   Relationships  . Social connections:    Talks on phone: Not on file    Gets together: Not on file    Attends religious service: Not on file    Active member of club or organization: Not on file    Attends meetings of clubs or organizations: Not on file    Relationship status: Not on file  . Intimate partner violence:    Fear of current or ex partner: Not on file    Emotionally abused: Not on file    Physically abused: Not on file    Forced sexual activity: Not on file  Other Topics Concern  . Not on file  Social History Narrative  .  Not on file      Review of Systems  Constitutional: Negative for activity change, chills, fatigue and unexpected weight change.  HENT: Positive for congestion, ear pain, postnasal drip, rhinorrhea and sinus pressure. Negative for sinus pain and sore throat.   Respiratory: Positive for cough. Negative for chest tightness, shortness of breath and wheezing.   Cardiovascular: Negative for chest pain and palpitations.  Gastrointestinal: Positive for abdominal pain and constipation. Negative for abdominal distention, diarrhea, nausea and vomiting.  Endocrine: Negative for cold intolerance, heat intolerance, polydipsia and polyuria.       Well controlled thyroid disease  Musculoskeletal: Positive for arthralgias. Negative for back pain, joint swelling and neck pain.       Bilateral wrist pain, stretching into the thumb and first two fingers of her hands .  Skin: Negative for rash.       Itchy area on right side of the neck which is without rash or redness that she can see.   Allergic/Immunologic: Positive for environmental allergies.  Neurological: Positive for headaches. Negative for dizziness, tremors and numbness.  Hematological: Negative for adenopathy. Does not bruise/bleed easily.  Psychiatric/Behavioral: Positive for sleep disturbance. Negative for behavioral problems (Depression) and suicidal ideas. The patient is nervous/anxious.     Today's  Vitals   12/26/18 0849  BP: 130/69  Pulse: 68  Resp: 16  Temp: 97.6 F (36.4 C)  SpO2: 96%  Weight: 145 lb (65.8 kg)  Height: 5\' 6"  (1.676 m)   Body mass index is 23.4 kg/m.  Physical Exam Vitals signs and nursing note reviewed.  Constitutional:      General: She is not in acute distress.    Appearance: Normal appearance. She is well-developed. She is not diaphoretic.  HENT:     Head: Normocephalic and atraumatic.     Right Ear: Tympanic membrane is erythematous and bulging.     Left Ear: Tympanic membrane is erythematous and bulging.     Nose:     Right Sinus: Maxillary sinus tenderness and frontal sinus tenderness present.     Left Sinus: Maxillary sinus tenderness and frontal sinus tenderness present.     Mouth/Throat:     Pharynx: Posterior oropharyngeal erythema present. No oropharyngeal exudate.  Eyes:     Conjunctiva/sclera: Conjunctivae normal.     Pupils: Pupils are equal, round, and reactive to light.  Neck:     Musculoskeletal: Normal range of motion and neck supple.     Thyroid: No thyromegaly.     Vascular: No carotid bruit or JVD.     Trachea: No tracheal deviation.  Cardiovascular:     Rate and Rhythm: Normal rate and regular rhythm.     Heart sounds: Normal heart sounds. No murmur. No friction rub. No gallop.   Pulmonary:     Effort: Pulmonary effort is normal. No respiratory distress.     Breath sounds: Normal breath sounds. No wheezing or rales.  Chest:     Chest wall: No tenderness.  Abdominal:     General: Bowel sounds are normal. There is no distension.     Palpations: Abdomen is soft.     Tenderness: There is no abdominal tenderness.  Musculoskeletal: Normal range of motion.     Comments: Bilateral wrist tenderness with tingling when the thumb or first two fingers are palpated. ROM and strength are currently intact. There is also mild swelling and tenderness of the proximal joint of third finger of left hand. No redness or warmth noted.  Lymphadenopathy:     Cervical: No cervical adenopathy.  Skin:    General: Skin is warm and dry.     Capillary Refill: Capillary refill takes less than 2 seconds.  Neurological:     Mental Status: She is alert and oriented to person, place, and time.     Cranial Nerves: No cranial nerve deficit.  Psychiatric:        Behavior: Behavior normal.        Thought Content: Thought content normal.        Judgment: Judgment normal.        Assessment/Plan: 1. Carpal tunnel syndrome on both sides Increase meloxicam to 15mg  daily to reduce pain and inflammation. New prescription for bilateral carpal tunnel splints given today. Should wear at night and take off during the day.  - meloxicam (MOBIC) 15 MG tablet; Take 0.5 tablets (7.5 mg total) by mouth daily.  Dispense: 30 tablet; Refill: 3  2. Primary generalized (osteo)arthritis May continue to take tramadol/APAP 37.5/325mg  tablets up to twice daily if needed for more severe pain. A new prescription was provided today.  - traMADol-acetaminophen (ULTRACET) 37.5-325 MG tablet; Take 1 tablet by mouth 2 (two) times daily as needed.  Dispense: 60 tablet; Refill: 0  3. Generalized anxiety disorder May take alprazolam 0.25mg  twice daily if needed for acute anxiety. New prescription provided today.  - ALPRAZolam (XANAX) 0.25 MG tablet; Take 1 tablet (0.25 mg total) by mouth daily as needed for anxiety. for anxiety  Dispense: 30 tablet; Refill: 3  4. Primary insomnia Continue ambien 10mg  at bedtime if needed for insomnia. New prescription provided today.  - zolpidem (AMBIEN) 10 MG tablet; Take 1 tablet (10 mg total) by mouth at bedtime.  Dispense: 30 tablet; Refill: 3  5. Atopic dermatitis, unspecified type Apply triamcinolone cream twice daily as needed to reduce itching and dryness.  - triamcinolone (KENALOG) 0.025 % cream; Apply 1 application topically 2 (two) times daily.  Dispense: 80 g; Refill: 2  6. Screening for breast cancer - MM DIGITAL  SCREENING BILATERAL; Future  7. Encounter for long-term (current) use of high-risk medication - POCT Urine Drug Screen uds negative for all controlled substances today.   General Counseling: kobi aller understanding of the findings of todays visit and agrees with plan of treatment. I have discussed any further diagnostic evaluation that may be needed or ordered today. We also reviewed her medications today. she has been encouraged to call the office with any questions or concerns that should arise related to todays visit.  Reviewed risks and possible side effects associated with taking opiates, benzodiazepines and other CNS depressants. Combination of these could cause dizziness and drowsiness. Advised patient not to drive or operate machinery when taking these medications, as patient's and other's life can be at risk and will have consequences. Patient verbalized understanding in this matter. Dependence and abuse for these drugs will be monitored closely. A Controlled substance policy and procedure is on file which allows [facility] to order a urine drug screen test at any visit. Patient understands and agrees with the plan  This patient was seen by Leretha Pol FNP Collaboration with Dr Lavera Guise as a part of collaborative care agreement  Orders Placed This Encounter  Procedures  . MM DIGITAL SCREENING BILATERAL  . POCT Urine Drug Screen    Meds ordered this encounter  Medications  . meloxicam (MOBIC) 15 MG tablet    Sig: Take 0.5 tablets (7.5 mg total) by mouth daily.  Dispense:  30 tablet    Refill:  3    Order Specific Question:   Supervising Provider    Answer:   Lavera Guise [[id number]]  . traMADol-acetaminophen (ULTRACET) 37.5-325 MG tablet    Sig: Take 1 tablet by mouth 2 (two) times daily as needed.    Dispense:  60 tablet    Refill:  0    Patient has good RX coupon to use for this prescription.    Order Specific Question:   Supervising Provider     Answer:   Lavera Guise [[id number]]  . ALPRAZolam (XANAX) 0.25 MG tablet    Sig: Take 1 tablet (0.25 mg total) by mouth daily as needed for anxiety. for anxiety    Dispense:  30 tablet    Refill:  3    Order Specific Question:   Supervising Provider    Answer:   Lavera Guise [[id number]]  . zolpidem (AMBIEN) 10 MG tablet    Sig: Take 1 tablet (10 mg total) by mouth at bedtime.    Dispense:  30 tablet    Refill:  3    Order Specific Question:   Supervising Provider    Answer:   Lavera Guise [[id number]]  . triamcinolone (KENALOG) 0.025 % cream    Sig: Apply 1 application topically 2 (two) times daily.    Dispense:  80 g    Refill:  2    Order Specific Question:   Supervising Provider    Answer:   Lavera Guise [[id number]]    Time spent: 51 Minutes      Dr Lavera Guise Internal medicine

## 2019-02-02 ENCOUNTER — Other Ambulatory Visit: Payer: Self-pay

## 2019-02-02 DIAGNOSIS — G5603 Carpal tunnel syndrome, bilateral upper limbs: Secondary | ICD-10-CM

## 2019-02-02 MED ORDER — MELOXICAM 15 MG PO TABS: ORAL_TABLET | ORAL | 3 refills | Status: DC

## 2019-03-05 ENCOUNTER — Other Ambulatory Visit: Payer: Self-pay

## 2019-03-05 MED ORDER — PANTOPRAZOLE SODIUM 40 MG PO TBEC: 40.0000 mg | DELAYED_RELEASE_TABLET | Freq: Every day | ORAL | 1 refills | Status: DC

## 2019-03-18 ENCOUNTER — Ambulatory Visit (INDEPENDENT_AMBULATORY_CARE_PROVIDER_SITE_OTHER): Payer: BLUE CROSS/BLUE SHIELD | Admitting: Nurse Practitioner

## 2019-03-18 ENCOUNTER — Other Ambulatory Visit (INDEPENDENT_AMBULATORY_CARE_PROVIDER_SITE_OTHER): Payer: BLUE CROSS/BLUE SHIELD

## 2019-04-01 ENCOUNTER — Other Ambulatory Visit (INDEPENDENT_AMBULATORY_CARE_PROVIDER_SITE_OTHER): Payer: Self-pay | Admitting: Internal Medicine

## 2019-04-16 ENCOUNTER — Encounter (INDEPENDENT_AMBULATORY_CARE_PROVIDER_SITE_OTHER): Payer: Self-pay | Admitting: Nurse Practitioner

## 2019-04-16 ENCOUNTER — Ambulatory Visit (INDEPENDENT_AMBULATORY_CARE_PROVIDER_SITE_OTHER): Payer: Medicare Other

## 2019-04-16 ENCOUNTER — Ambulatory Visit (INDEPENDENT_AMBULATORY_CARE_PROVIDER_SITE_OTHER): Payer: Medicare Other | Admitting: Nurse Practitioner

## 2019-04-16 ENCOUNTER — Other Ambulatory Visit: Payer: Self-pay

## 2019-04-16 VITALS — BP 102/67 | HR 64 | Resp 10 | Ht 66.0 in | Wt 149.0 lb

## 2019-04-16 DIAGNOSIS — I714 Abdominal aortic aneurysm, without rupture, unspecified: Secondary | ICD-10-CM

## 2019-04-16 DIAGNOSIS — F17210 Nicotine dependence, cigarettes, uncomplicated: Secondary | ICD-10-CM

## 2019-04-16 DIAGNOSIS — F17200 Nicotine dependence, unspecified, uncomplicated: Secondary | ICD-10-CM

## 2019-04-16 DIAGNOSIS — E785 Hyperlipidemia, unspecified: Secondary | ICD-10-CM | POA: Diagnosis not present

## 2019-04-16 DIAGNOSIS — I70213 Atherosclerosis of native arteries of extremities with intermittent claudication, bilateral legs: Secondary | ICD-10-CM | POA: Diagnosis not present

## 2019-04-16 DIAGNOSIS — Z79899 Other long term (current) drug therapy: Secondary | ICD-10-CM

## 2019-04-16 NOTE — Progress Notes (Signed)
SUBJECTIVE:  Patient ID: Joy Carpenter, female    DOB: [birth year], 68 y.o.   MRN: [id number] Chief Complaint  Patient presents with  . Follow-up    HPI  Joy Carpenter is a 68 y.o. female The patient returns to the office for surveillance of a known abdominal aortic aneurysm. Patient denies abdominal pain or back pain, no other abdominal complaints. No changes suggesting embolic episodes.   There have been no interval changes in the patient's overall health care since his last visit.  Patient denies amaurosis fugax or TIA symptoms. There is history of claudication but no rest pain symptoms of the lower extremities. The patient denies angina or shortness of breath.   Duplex US of the aorta and iliac arteries shows an AAA measured 3.96  cm with no iliac artery aneurysms.  Previous study done on 09/16/2018 measure abdominal aortic aneurysm at 4.2 cm.  Essentially no change compared to previous exam.  There is a beaded necklace appearance to the abdominal aorta.  This is also consistent with previous exam.  Past Medical History:  Diagnosis Date  . Anxiety   . Aortic aneurysm ([place])   . Carpal tunnel syndrome 2019  . Depression   . Emphysema of lung ([place])    "mild"  . GERD (gastroesophageal reflux disease)   . Headache    daily - stress  . Herpes   . Hyperlipidemia   . Insomnia   . Osteoporosis   . Vertigo    1 episode - approx 2010   . Weakness of both legs    intermittent    Past Surgical History:  Procedure Laterality Date  . COLONOSCOPY    . COLONOSCOPY WITH PROPOFOL N/A 06/14/2016   Procedure: COLONOSCOPY WITH PROPOFOL;  Surgeon: Lucilla Lame, MD;  Location: [facility];  Service: Endoscopy;  Laterality: N/A;  . ESOPHAGOGASTRODUODENOSCOPY (EGD) WITH PROPOFOL N/A 06/14/2016   Procedure: ESOPHAGOGASTRODUODENOSCOPY (EGD) WITH PROPOFOL;  Surgeon: Lucilla Lame, MD;  Location: [facility];  Service: Endoscopy;  Laterality: N/A;  . TUBAL LIGATION      Social  History   Socioeconomic History  . Marital status: Divorced    Spouse name: Not on file  . Number of children: Not on file  . Years of education: Not on file  . Highest education level: Not on file  Occupational History  . Not on file  Social Needs  . Financial resource strain: Not on file  . Food insecurity    Worry: Not on file    Inability: Not on file  . Transportation needs    Medical: Not on file    Non-medical: Not on file  Tobacco Use  . Smoking status: Current Every Day Smoker    Packs/day: 1.00    Years: 30.00    Pack years: 30.00    Types: Cigarettes  . Smokeless tobacco: Never Used  Substance and Sexual Activity  . Alcohol use: No  . Drug use: Not on file  . Sexual activity: Not on file  Lifestyle  . Physical activity    Days per week: Not on file    Minutes per session: Not on file  . Stress: Not on file  Relationships  . Social Herbalist on phone: Not on file    Gets together: Not on file    Attends religious service: Not on file    Active member of club or organization: Not on file    Attends meetings of clubs or  organizations: Not on file    Relationship status: Not on file  . Intimate partner violence    Fear of current or ex partner: Not on file    Emotionally abused: Not on file    Physically abused: Not on file    Forced sexual activity: Not on file  Other Topics Concern  . Not on file  Social History Narrative  . Not on file    Family History  Problem Relation Age of Onset  . Leukemia Mother   . Aneurysm Other     No Known Allergies   Review of Systems   Review of Systems: Negative Unless Checked Constitutional: [] Weight loss  [] Fever  [] Chills Cardiac: [] Chest pain   []  Atrial Fibrillation  [] Palpitations   [] Shortness of breath when laying flat   [] Shortness of breath with exertion. [] Shortness of breath at rest Vascular:  [] Pain in legs with walking   [] Pain in legs with standing [] Pain in legs when laying flat    [] Claudication    [] Pain in feet when laying flat    [] History of DVT   [] Phlebitis   [] Swelling in legs   [] Varicose veins   [] Non-healing ulcers Pulmonary:   [] Uses home oxygen   [] Productive cough   [] Hemoptysis   [] Wheeze  [] COPD   [] Asthma Neurologic:  [] Dizziness   [] Seizures  [] Blackouts [] History of stroke   [] History of TIA  [] Aphasia   [] Temporary Blindness   [] Weakness or numbness in arm   [x] Weakness or numbness in leg Musculoskeletal:   [] Joint swelling   [] Joint pain   [] Low back pain  []  History of Knee Replacement [] Arthritis [] back Surgeries  []  Spinal Stenosis    Hematologic:  [] Easy bruising  [] Easy bleeding   [] Hypercoagulable state   [] Anemic Gastrointestinal:  [] Diarrhea   [] Vomiting  [x] Gastroesophageal reflux/heartburn   [] Difficulty swallowing. [] Abdominal pain Genitourinary:  [] Chronic kidney disease   [] Difficult urination  [] Anuric   [] Blood in urine [] Frequent urination  [] Burning with urination   [] Hematuria Skin:  [] Rashes   [] Ulcers [] Wounds Psychological:  [x] History of anxiety   [x]  History of major depression  []  Memory Difficulties      OBJECTIVE:   Physical Exam  BP 102/67 (BP Location: Left Arm, Patient Position: Sitting, Cuff Size: Normal)   Pulse 64   Resp 10   Ht 5\' 6"  (1.676 m)   Wt 149 lb (67.6 kg)   BMI 24.05 kg/m   Gen: WD/WN, NAD Head: [place]/AT, No temporalis wasting.  Ear/Nose/Throat: Hearing grossly intact, nares w/o erythema or drainage Eyes: PER, EOMI, sclera nonicteric.  Neck: Supple, no masses.  No JVD.  Pulmonary:  Good air movement, no use of accessory muscles.  Cardiac: RRR Vascular:  Vessel Right Left  Radial Palpable Palpable  Dorsalis Pedis  trace palpable Palpable  Posterior Tibial  trace palpable Palpable   Gastrointestinal: soft, non-distended. No guarding/no peritoneal signs.  Musculoskeletal: M/S 5/5 throughout.  No deformity or atrophy.  Neurologic: Pain and light touch intact in extremities.  Symmetrical.  Speech is  fluent. Motor exam as listed above. Psychiatric: Judgment intact, Mood & affect appropriate for pt's clinical situation. Dermatologic: No Venous rashes. No Ulcers Noted.  No changes consistent with cellulitis. Lymph : No Cervical lymphadenopathy, no lichenification or skin changes of chronic lymphedema.       ASSESSMENT AND PLAN:  1. AAA (abdominal aortic aneurysm) without rupture (HCC) Duplex US of the aorta and iliac arteries shows an AAA measured 3.96  cm with no  iliac artery aneurysms.  Previous study done on 09/16/2018 measure abdominal aortic aneurysm at 4.2 cm.  Essentially no change compared to previous exam.  There is a beaded necklace appearance to the abdominal aorta.  This is also consistent with previous exam.   No surgery or intervention at this time. The patient has an asymptomatic abdominal aortic aneurysm that is greater than 4 cm but less than 5 cm in maximal diameter.  I have discussed the natural history of abdominal aortic aneurysm and the small risk of rupture for aneurysm less than 5 cm in size.  However, as these small aneurysms tend to enlarge over time, continued surveillance with ultrasound or CT scan is mandatory.  I have also discussed optimizing medical management with hypertension and lipid control and the importance of abstinence from tobacco.  The patient is also encouraged to exercise a minimum of 30 minutes 4 times a week.  Should the patient develop new onset abdominal or back pain or signs of peripheral embolization they are instructed to seek medical attention immediately and to alert the physician providing care that they have an aneurysm.  The patient voices their understanding. I have scheduled the patient to return in 6 months with an aortic duplex. - VAS [place] AAA DUPLEX; Future  2. Hyperlipidemia, unspecified hyperlipidemia type Continue statin as ordered and reviewed, no changes at this time   3. Tobacco use disorder Smoking cessation was discussed,  3-10 minutes spent on this topic specifically   4. Atherosclerosis of native artery of both lower extremities with intermittent claudication ([place]) The patient has diminished pulses on exam as well as monophasic waveforms within her right common iliac artery.  Given her multiple risk factors for peripheral artery disease as well as previous history of claudication, we will obtain a baseline ABI as I do not see any currently. - VAS [place] ABI WITH/WO TBI; Future   Current Outpatient Medications on File Prior to Visit  Medication Sig Dispense Refill  . ALPRAZolam (XANAX) 0.25 MG tablet Take 1 tablet (0.25 mg total) by mouth daily as needed for anxiety. for anxiety 30 tablet 3  . levothyroxine (SYNTHROID) 50 MCG tablet TAKE 1 TABLET BY MOUTH EVERY MORNING 30 MINUTES PRIOR TO EATING FOOD 90 tablet 0  . meloxicam (MOBIC) 15 MG tablet Take 1/2 tab po daily 30 tablet 3  . pantoprazole (PROTONIX) 40 MG tablet Take 1 tablet (40 mg total) by mouth daily. 90 tablet 1  . polyethylene glycol powder (GLYCOLAX/MIRALAX) powder AS DIRECTED FOR COLONIC PREP.  0  . simvastatin (ZOCOR) 20 MG tablet TAKE 1 TABLET BY MOUTH EVERY DAY 90 tablet 3  . traMADol-acetaminophen (ULTRACET) 37.5-325 MG tablet Take 1 tablet by mouth 2 (two) times daily as needed. 60 tablet 0  . triamcinolone (KENALOG) 0.025 % cream Apply 1 application topically 2 (two) times daily. 80 g 2  . valACYclovir (VALTREX) 500 MG tablet Take 1 tablet (500 mg total) by mouth 2 (two) times daily. 60 tablet 2  . zolpidem (AMBIEN) 10 MG tablet Take 1 tablet (10 mg total) by mouth at bedtime. 30 tablet 3  . amoxicillin-clavulanate (AUGMENTIN) 875-125 MG tablet Take 1 tablet by mouth 2 (two) times daily. (Patient not taking: Reported on 04/16/2019) 14 tablet 0  . aspirin EC 81 MG tablet Take 81 mg by mouth daily.    Marland Kitchen lubiprostone (AMITIZA) 8 MCG capsule Take 1 capsule (8 mcg total) by mouth 2 (two) times daily with a meal. (Patient not taking: Reported on  04/16/2019) 60 capsule 3   No current facility-administered medications on file prior to visit.     There are no Patient Instructions on file for this visit. Return in about 6 months (around 10/17/2019) for AAA, PAD.   Kris Hartmann, NP  This note was completed with Sales executive.  Any errors are purely unintentional.

## 2019-04-30 ENCOUNTER — Ambulatory Visit (INDEPENDENT_AMBULATORY_CARE_PROVIDER_SITE_OTHER): Payer: Medicare Other | Admitting: Nurse Practitioner

## 2019-04-30 ENCOUNTER — Other Ambulatory Visit: Payer: Self-pay

## 2019-04-30 ENCOUNTER — Encounter: Payer: Self-pay | Admitting: Nurse Practitioner

## 2019-04-30 VITALS — BP 120/78 | HR 69 | Resp 16 | Ht 67.0 in | Wt 150.0 lb

## 2019-04-30 DIAGNOSIS — K219 Gastro-esophageal reflux disease without esophagitis: Secondary | ICD-10-CM | POA: Diagnosis not present

## 2019-04-30 DIAGNOSIS — Z1239 Encounter for other screening for malignant neoplasm of breast: Secondary | ICD-10-CM

## 2019-04-30 DIAGNOSIS — R5383 Other fatigue: Secondary | ICD-10-CM | POA: Insufficient documentation

## 2019-04-30 DIAGNOSIS — E559 Vitamin D deficiency, unspecified: Secondary | ICD-10-CM

## 2019-04-30 DIAGNOSIS — R109 Unspecified abdominal pain: Secondary | ICD-10-CM | POA: Diagnosis not present

## 2019-04-30 DIAGNOSIS — Z124 Encounter for screening for malignant neoplasm of cervix: Secondary | ICD-10-CM | POA: Diagnosis not present

## 2019-04-30 DIAGNOSIS — Z0001 Encounter for general adult medical examination with abnormal findings: Secondary | ICD-10-CM | POA: Diagnosis not present

## 2019-04-30 DIAGNOSIS — F5101 Primary insomnia: Secondary | ICD-10-CM | POA: Diagnosis not present

## 2019-04-30 DIAGNOSIS — Z79899 Other long term (current) drug therapy: Secondary | ICD-10-CM

## 2019-04-30 DIAGNOSIS — R300 Dysuria: Secondary | ICD-10-CM

## 2019-04-30 DIAGNOSIS — F411 Generalized anxiety disorder: Secondary | ICD-10-CM

## 2019-04-30 LAB — POCT URINE DRUG SCREEN
POC Amphetamine UR: NOT DETECTED
POC BENZODIAZEPINES UR: POSITIVE — AB
POC Barbiturate UR: NOT DETECTED
POC Cocaine UR: NOT DETECTED
POC Ecstasy UR: NOT DETECTED
POC Marijuana UR: NOT DETECTED
POC Methadone UR: NOT DETECTED
POC Methamphetamine UR: NOT DETECTED
POC Opiate Ur: NOT DETECTED
POC Oxycodone UR: NOT DETECTED
POC PHENCYCLIDINE UR: NOT DETECTED
POC TRICYCLICS UR: NOT DETECTED

## 2019-04-30 MED ORDER — ALPRAZOLAM 0.25 MG PO TABS: 0.2500 mg | ORAL_TABLET | Freq: Every day | ORAL | 3 refills | Status: DC | PRN

## 2019-04-30 MED ORDER — ZOLPIDEM TARTRATE 10 MG PO TABS: 10.0000 mg | ORAL_TABLET | Freq: Every day | ORAL | 3 refills | Status: DC

## 2019-04-30 MED ORDER — PANTOPRAZOLE SODIUM 40 MG PO TBEC: 40.0000 mg | DELAYED_RELEASE_TABLET | Freq: Every day | ORAL | 1 refills | Status: DC

## 2019-04-30 NOTE — Progress Notes (Signed)
[facility] [place], [place] [address]  Internal MEDICINE  Office Visit Note  Patient Name: Joy Carpenter  [id number]  [id number]  Date of Service: 04/30/2019   Pt is here for routine health maintenance examination   Chief Complaint  Patient presents with  . Medicare Wellness    medication refills for ambien abd xanax  . Gynecologic Exam  . Gastroesophageal Reflux  . Hyperlipidemia  . Depression  . Quality Metric Gaps    pt does not take the pneumonia vaccine     The patient is here for health maintenance exam.She is c/o left abdominal pain, about 1 inch below the lower rib cage. Has had this for some time. Was told she did have hernia. Was diagnosed with abdominal aortic anyeurism at the same time. Primary focus has been on the aneurism. This has been stable. Most recent ultrasound was done 04/12/2019, and aneurism is stable. Pain in left abdomen now getting worse. Now feeling like belly is getting bloated and "blowing up" much more often. This is worse when she is lifting and exerting herself .Food feels like it's getting stuck at the end of esophagus. However, eating does not seem to make the bloating and abdominal pain any worse    Current Medication: Outpatient Encounter Medications as of 04/30/2019  Medication Sig Note  . ALPRAZolam (XANAX) 0.25 MG tablet Take 1 tablet (0.25 mg total) by mouth daily as needed for anxiety. for anxiety   . levothyroxine (SYNTHROID) 50 MCG tablet TAKE 1 TABLET BY MOUTH EVERY MORNING 30 MINUTES PRIOR TO EATING FOOD   . meloxicam (MOBIC) 15 MG tablet Take 1/2 tab po daily   . pantoprazole (PROTONIX) 40 MG tablet Take 1 tablet (40 mg total) by mouth daily.   . simvastatin (ZOCOR) 20 MG tablet TAKE 1 TABLET BY MOUTH EVERY DAY   . traMADol-acetaminophen (ULTRACET) 37.5-325 MG tablet Take 1 tablet by mouth 2 (two) times daily as needed.   . triamcinolone (KENALOG) 0.025 % cream Apply 1 application topically 2 (two) times  daily.   . valACYclovir (VALTREX) 500 MG tablet Take 1 tablet (500 mg total) by mouth 2 (two) times daily.   Marland Kitchen zolpidem (AMBIEN) 10 MG tablet Take 1 tablet (10 mg total) by mouth at bedtime.   . [DISCONTINUED] ALPRAZolam (XANAX) 0.25 MG tablet Take 1 tablet (0.25 mg total) by mouth daily as needed for anxiety. for anxiety   . [DISCONTINUED] pantoprazole (PROTONIX) 40 MG tablet Take 1 tablet (40 mg total) by mouth daily.   . [DISCONTINUED] zolpidem (AMBIEN) 10 MG tablet Take 1 tablet (10 mg total) by mouth at bedtime.   . [DISCONTINUED] amoxicillin-clavulanate (AUGMENTIN) 875-125 MG tablet Take 1 tablet by mouth 2 (two) times daily. (Patient not taking: Reported on 04/16/2019)   . [DISCONTINUED] aspirin EC 81 MG tablet Take 81 mg by mouth daily.   . [DISCONTINUED] lubiprostone (AMITIZA) 8 MCG capsule Take 1 capsule (8 mcg total) by mouth 2 (two) times daily with a meal. (Patient not taking: Reported on 04/16/2019)   . [DISCONTINUED] polyethylene glycol powder (GLYCOLAX/MIRALAX) powder AS DIRECTED FOR COLONIC PREP. 06/08/2016: Received from: External Pharmacy   No facility-administered encounter medications on file as of 04/30/2019.     Surgical History: Past Surgical History:  Procedure Laterality Date  . COLONOSCOPY    . COLONOSCOPY WITH PROPOFOL N/A 06/14/2016   Procedure: COLONOSCOPY WITH PROPOFOL;  Surgeon: Lucilla Lame, MD;  Location: [facility];  Service: Endoscopy;  Laterality: N/A;  .  ESOPHAGOGASTRODUODENOSCOPY (EGD) WITH PROPOFOL N/A 06/14/2016   Procedure: ESOPHAGOGASTRODUODENOSCOPY (EGD) WITH PROPOFOL;  Surgeon: Lucilla Lame, MD;  Location: [facility] [place];  Service: Endoscopy;  Laterality: N/A;  . TUBAL LIGATION      Medical History: Past Medical History:  Diagnosis Date  . Anxiety   . Aortic aneurysm ([place])   . Carpal tunnel syndrome 2019  . Depression   . Emphysema of lung ([place])    "mild"  . GERD (gastroesophageal reflux disease)   . Headache    daily - stress  .  Herpes   . Hyperlipidemia   . Insomnia   . Osteoporosis   . Vertigo    1 episode - approx 2010   . Weakness of both legs    intermittent    Family History: Family History  Problem Relation Age of Onset  . Leukemia Mother   . Aneurysm Other       Review of Systems  Constitutional: Negative for chills, fatigue and unexpected weight change.  HENT: Negative for congestion, postnasal drip, rhinorrhea, sneezing and sore throat.   Respiratory: Negative for cough, chest tightness, shortness of breath and wheezing.   Cardiovascular: Negative for chest pain and palpitations.  Gastrointestinal: Positive for abdominal distention, abdominal pain, constipation and nausea. Negative for diarrhea and vomiting.  Endocrine: Negative for cold intolerance, heat intolerance, polydipsia and polyuria.       Well controlled thyroid disease  Genitourinary: Negative for dysuria, flank pain and hematuria.  Musculoskeletal: Positive for arthralgias and back pain. Negative for joint swelling and neck pain.  Skin: Negative for rash.  Allergic/Immunologic: Negative for environmental allergies.  Neurological: Negative for dizziness, tremors, numbness and headaches.  Hematological: Negative for adenopathy. Does not bruise/bleed easily.  Psychiatric/Behavioral: Negative for behavioral problems (Depression), sleep disturbance and suicidal ideas. The patient is nervous/anxious.     Today's Vitals   04/30/19 0920  BP: 120/78  Pulse: 69  Resp: 16  SpO2: 97%  Weight: 150 lb (68 kg)  Height: 5\' 7"  (1.702 m)   Body mass index is 23.49 kg/m.  Physical Exam Vitals signs and nursing note reviewed.  Constitutional:      General: She is not in acute distress.    Appearance: She is well-developed. She is not diaphoretic.  HENT:     Head: Normocephalic and atraumatic.     Mouth/Throat:     Pharynx: No oropharyngeal exudate.  Eyes:     Pupils: Pupils are equal, round, and reactive to light.  Neck:      Musculoskeletal: Normal range of motion and neck supple.     Thyroid: No thyromegaly.     Vascular: No carotid bruit or JVD.     Trachea: No tracheal deviation.  Cardiovascular:     Rate and Rhythm: Normal rate and regular rhythm.     Heart sounds: Normal heart sounds. No murmur. No friction rub. No gallop.   Pulmonary:     Effort: Pulmonary effort is normal. No respiratory distress.     Breath sounds: Normal breath sounds. No wheezing or rales.  Chest:     Chest wall: No tenderness.     Breasts:        Right: Normal. No swelling, bleeding, inverted nipple, mass, nipple discharge, skin change or tenderness.        Left: Normal. No swelling, bleeding, inverted nipple, mass, nipple discharge, skin change or tenderness.  Abdominal:     General: Bowel sounds are normal. There is distension.     Palpations:  Abdomen is soft.     Tenderness: There is abdominal tenderness in the left upper quadrant and left lower quadrant.     Hernia: There is no hernia in the left inguinal area or right inguinal area.  Genitourinary:    General: Normal vulva.     Exam position: Supine.     Labia:        Right: No rash, tenderness or lesion.        Left: No rash, tenderness or lesion.      Vagina: Normal. No erythema, tenderness or bleeding.     Cervix: No cervical motion tenderness, discharge, friability, erythema or cervical bleeding.     Uterus: Normal.      Comments: No tenderness, masses, or organomeglay present during bimanual exam . Musculoskeletal: Normal range of motion.  Lymphadenopathy:     Cervical: No cervical adenopathy.     Upper Body:     Right upper body: No axillary adenopathy.     Left upper body: No axillary adenopathy.     Lower Body: No right inguinal adenopathy. No left inguinal adenopathy.  Skin:    General: Skin is warm and dry.     Capillary Refill: Capillary refill takes less than 2 seconds.  Neurological:     Mental Status: She is alert and oriented to person, place, and  time.     Cranial Nerves: No cranial nerve deficit.  Psychiatric:        Behavior: Behavior normal.        Thought Content: Thought content normal.        Judgment: Judgment normal.    Depression screen [facility] 2/9 04/30/2019 12/26/2018 08/26/2018 05/01/2018 12/16/2017  Decreased Interest 0 0 0 0 0  Down, Depressed, Hopeless 0 0 0 0 0  PHQ - 2 Score 0 0 0 0 0    Functional Status Survey: Is the patient deaf or have difficulty hearing?: No Does the patient have difficulty seeing, even when wearing glasses/contacts?: No Does the patient have difficulty concentrating, remembering, or making decisions?: No Does the patient have difficulty walking or climbing stairs?: No Does the patient have difficulty dressing or bathing?: No Does the patient have difficulty doing errands alone such as visiting a doctor's office or shopping?: No  MMSE - Mini Mental State Exam 04/30/2019  Orientation to time 5  Orientation to Place 5  Registration 3  Attention/ Calculation 5  Recall 3  Language- name 2 objects 2  Language- repeat 1  Language- follow 3 step command 3  Language- read & follow direction 1  Write a sentence 1  Copy design 1  Total score 30    Fall Risk  04/30/2019 12/26/2018 08/26/2018 05/01/2018 12/16/2017  Falls in the past year? 0 0 0 No No  Number falls in past yr: - 0 - - -  Injury with Fall? - 0 - - -      LABS: Recent Results (from the past 2160 hour(s))  POCT Urine Drug Screen     Status: Abnormal   Collection Time: 04/30/19  9:29 AM  Result Value Ref Range   POC METHAMPHETAMINE UR None Detected None Detected   POC Opiate Ur None Detected None Detected   POC Barbiturate UR None Detected None Detected   POC Amphetamine UR None Detected None Detected   POC Oxycodone UR None Detected None Detected   POC Cocaine UR None Detected None Detected   POC Ecstasy UR None Detected None Detected   POC TRICYCLICS UR  None Detected None Detected   POC PHENCYCLIDINE UR None Detected None  Detected   POC MARIJUANA UR None Detected None Detected   POC METHADONE UR None Detected None Detected   POC BENZODIAZEPINES UR Positive (A) None Detected   URINE TEMPERATURE     POC DRUG SCREEN OXIDANTS URINE     POC SPECIFIC GRAVITY URINE     POC PH URINE     Methylenedioxyamphetamine     Assessment/Plan: 1. Encounter for general adult medical examination with abnormal findings Annual health maintenance exam with pap smear today - CBC with Differential/Platelet; Future - Comprehensive metabolic panel; Future - T4, free; Future - TSH; Future - Lipid panel; Future - Lipid panel - TSH - T4, free - Comprehensive metabolic panel - CBC with Differential/Platelet - pantoprazole (PROTONIX) 40 MG tablet; Take 1 tablet (40 mg total) by mouth daily.  Dispense: 90 tablet; Refill: 1  2. Left sided abdominal pain Will get new abdominal ultrasound for further evaluation.  - US Abdomen Complete; Future - pantoprazole (PROTONIX) 40 MG tablet; Take 1 tablet (40 mg total) by mouth daily.  Dispense: 90 tablet; Refill: 1  3. Gastroesophageal reflux disease without esophagitis - pantoprazole (PROTONIX) 40 MG tablet; Take 1 tablet (40 mg total) by mouth daily.  Dispense: 90 tablet; Refill: 1  4. Generalized anxiety disorder May continue to take alprazolam 0.25mg  daily if needed for acute anxiety. - ALPRAZolam (XANAX) 0.25 MG tablet; Take 1 tablet (0.25 mg total) by mouth daily as needed for anxiety. for anxiety  Dispense: 30 tablet; Refill: 3   5. Primary insomnia May take ambien 10mg  at bedtime if needed for insomnia. - zolpidem (AMBIEN) 10 MG tablet; Take 1 tablet (10 mg total) by mouth at bedtime.  Dispense: 30 tablet; Refill: 3  6. Vitamin D deficiency - Vitamin D 1,25 dihydroxy; Future - Vitamin D 1,25 dihydroxy  7. Other fatigue Check labs, including thyroid panel for further evaluation.  - T4, free; Future - TSH; Future - TSH - T4, free   8. Routine cervical smear - Pap IG  and HPV (high risk) DNA detection   9. Screening for breast cancer - MM DIGITAL SCREENING BILATERAL; Future   10. Encounter for long-term (current) use of medications - POCT Urine Drug Screen appropriately positive for [facility] only.   11. Dysuria - UA/M w/rflx Culture, Routine   General Counseling: kwana ringel understanding of the findings of todays visit and agrees with plan of treatment. I have discussed any further diagnostic evaluation that may be needed or ordered today. We also reviewed her medications today. she has been encouraged to call the office with any questions or concerns that should arise related to todays visit.    Counseling:  Reviewed risks and possible side effects associated with taking opiates, benzodiazepines and other CNS depressants. Combination of these could cause dizziness and drowsiness. Advised patient not to drive or operate machinery when taking these medications, as patient's and other's life can be at risk and will have consequences. Patient verbalized understanding in this matter. Dependence and abuse for these drugs will be monitored closely. A Controlled substance policy and procedure is on file which allows [facility] to order a urine drug screen test at any visit. Patient understands and agrees with the plan  This patient was seen by Leretha Pol FNP Collaboration with Dr Lavera Guise as a part of collaborative care agreement  Orders Placed This Encounter  Procedures  . MM DIGITAL SCREENING BILATERAL  .  US Abdomen Complete  . UA/M w/rflx Culture, Routine  . CBC with Differential/Platelet  . Comprehensive metabolic panel  . T4, free  . TSH  . Lipid panel  . Vitamin D 1,25 dihydroxy  . POCT Urine Drug Screen    Meds ordered this encounter  Medications  . ALPRAZolam (XANAX) 0.25 MG tablet    Sig: Take 1 tablet (0.25 mg total) by mouth daily as needed for anxiety. for anxiety    Dispense:  30 tablet    Refill:  3     Order Specific Question:   Supervising Provider    Answer:   Lavera Guise [[id number]]  . pantoprazole (PROTONIX) 40 MG tablet    Sig: Take 1 tablet (40 mg total) by mouth daily.    Dispense:  90 tablet    Refill:  1    Order Specific Question:   Supervising Provider    Answer:   Lavera Guise [[id number]]  . zolpidem (AMBIEN) 10 MG tablet    Sig: Take 1 tablet (10 mg total) by mouth at bedtime.    Dispense:  30 tablet    Refill:  3    Order Specific Question:   Supervising Provider    Answer:   Lavera Guise [[id number]]    Time spent: [place], MD  Internal Medicine

## 2019-05-02 LAB — UA/M W/RFLX CULTURE, ROUTINE
Bilirubin, UA: NEGATIVE
Glucose, UA: NEGATIVE
Ketones, UA: NEGATIVE
Nitrite, UA: NEGATIVE
Protein,UA: NEGATIVE
RBC, UA: NEGATIVE
Specific Gravity, UA: >=1.03 — AB (ref 1.005–1.030)
Urobilinogen, Ur: 0.2 mg/dL (ref 0.2–1.0)
pH, UA: 5 (ref 5.0–7.5)

## 2019-05-02 LAB — MICROSCOPIC EXAMINATION: Epithelial Cells (non renal): >10 /hpf — AB (ref 0–10)

## 2019-05-02 LAB — URINE CULTURE, REFLEX: Organism ID, Bacteria: NO GROWTH

## 2019-05-05 ENCOUNTER — Other Ambulatory Visit: Payer: BLUE CROSS/BLUE SHIELD | Admitting: Nurse Practitioner

## 2019-05-09 LAB — PAP IG AND HPV HIGH-RISK: HPV, high-risk: NEGATIVE

## 2019-05-20 NOTE — Progress Notes (Signed)
Normal pap. Discuss at next visit

## 2019-05-22 ENCOUNTER — Other Ambulatory Visit: Payer: Medicare Other

## 2019-05-28 ENCOUNTER — Ambulatory Visit: Payer: Medicare Other | Admitting: Nurse Practitioner

## 2019-05-29 ENCOUNTER — Ambulatory Visit: Payer: Medicare Other

## 2019-05-29 ENCOUNTER — Other Ambulatory Visit: Payer: Self-pay

## 2019-05-29 DIAGNOSIS — R109 Unspecified abdominal pain: Secondary | ICD-10-CM

## 2019-06-01 NOTE — Progress Notes (Signed)
Thickened gallbladder wall. Otherwise, no new or acute findings. Discuss a visit 06/08/2019. Consider HIDA scan?

## 2019-06-08 ENCOUNTER — Ambulatory Visit: Payer: Medicare Other | Admitting: Nurse Practitioner

## 2019-06-08 ENCOUNTER — Encounter: Payer: Self-pay | Admitting: Nurse Practitioner

## 2019-06-08 VITALS — BP 117/75 | HR 73 | Resp 16 | Ht 66.0 in | Wt 149.8 lb

## 2019-06-08 DIAGNOSIS — K828 Other specified diseases of gallbladder: Secondary | ICD-10-CM

## 2019-06-08 DIAGNOSIS — M150 Primary generalized (osteo)arthritis: Secondary | ICD-10-CM

## 2019-06-08 DIAGNOSIS — R1011 Right upper quadrant pain: Secondary | ICD-10-CM

## 2019-06-08 MED ORDER — TRAMADOL-ACETAMINOPHEN 37.5-325 MG PO TABS: 1.0000 | ORAL_TABLET | Freq: Two times a day (BID) | ORAL | 1 refills | Status: DC | PRN

## 2019-06-08 NOTE — Progress Notes (Signed)
[facility] [place], [place] [address]  Internal MEDICINE  Office Visit Note  Patient Name: Joy Carpenter  [id number]  [id number]  Date of Service: 06/17/2019  Chief Complaint  Patient presents with  . Medical Management of Chronic Issues    labs follow up     The patient continues to have left abdominal pain, about 1 inch below the lower rib cage. Has had this for some time. Was told she did have hernia. Was diagnosed with abdominal aortic anyeurism at the same time. Primary focus has been on the aneurism. This has been stable. Most recent ultrasound was done 04/12/2019, and aneurism is stable. Pain in left abdomen now getting worse. Now feeling like belly is getting bloated and "blowing up" much more often. This is worse when she is lifting and exerting herself .Food feels like it's getting stuck at the end of esophagus. However, eating does not seem to make the bloating and abdominal pain any worse. She did have abdominal ultrasound. This showed stable aortic aneurysm. Also indicates a thickened gallbladder wall. There was no sludge or gallstones noted. Sludge has been present on prior abdominal ultrasounds. She also had routine, fasting labs done. Renal functions were very mildly elevated and LDL cholesterol was slightly high. There labs were normal.        Current Medication: Outpatient Encounter Medications as of 06/08/2019  Medication Sig  . ALPRAZolam (XANAX) 0.25 MG tablet Take 1 tablet (0.25 mg total) by mouth daily as needed for anxiety. for anxiety  . levothyroxine (SYNTHROID) 50 MCG tablet TAKE 1 TABLET BY MOUTH EVERY MORNING 30 MINUTES PRIOR TO EATING FOOD  . meloxicam (MOBIC) 15 MG tablet Take 1/2 tab po daily  . pantoprazole (PROTONIX) 40 MG tablet Take 1 tablet (40 mg total) by mouth daily.  . simvastatin (ZOCOR) 20 MG tablet TAKE 1 TABLET BY MOUTH EVERY DAY  . traMADol-acetaminophen (ULTRACET) 37.5-325 MG tablet Take 1 tablet by mouth 2 (two)  times daily as needed.  . triamcinolone (KENALOG) 0.025 % cream Apply 1 application topically 2 (two) times daily.  . valACYclovir (VALTREX) 500 MG tablet Take 1 tablet (500 mg total) by mouth 2 (two) times daily.  Marland Kitchen zolpidem (AMBIEN) 10 MG tablet Take 1 tablet (10 mg total) by mouth at bedtime.  . [DISCONTINUED] traMADol-acetaminophen (ULTRACET) 37.5-325 MG tablet Take 1 tablet by mouth 2 (two) times daily as needed.   No facility-administered encounter medications on file as of 06/08/2019.     Surgical History: Past Surgical History:  Procedure Laterality Date  . COLONOSCOPY    . COLONOSCOPY WITH PROPOFOL N/A 06/14/2016   Procedure: COLONOSCOPY WITH PROPOFOL;  Surgeon: Lucilla Lame, MD;  Location: [facility];  Service: Endoscopy;  Laterality: N/A;  . ESOPHAGOGASTRODUODENOSCOPY (EGD) WITH PROPOFOL N/A 06/14/2016   Procedure: ESOPHAGOGASTRODUODENOSCOPY (EGD) WITH PROPOFOL;  Surgeon: Lucilla Lame, MD;  Location: [facility];  Service: Endoscopy;  Laterality: N/A;  . TUBAL LIGATION      Medical History: Past Medical History:  Diagnosis Date  . Anxiety   . Aortic aneurysm ([place])   . Carpal tunnel syndrome 2019  . Depression   . Emphysema of lung ([place])    "mild"  . GERD (gastroesophageal reflux disease)   . Headache    daily - stress  . Herpes   . Hyperlipidemia   . Insomnia   . Osteoporosis   . Vertigo    1 episode - approx 2010   . Weakness of both  legs    intermittent    Family History: Family History  Problem Relation Age of Onset  . Leukemia Mother   . Aneurysm Other     Social History   Socioeconomic History  . Marital status: Divorced    Spouse name: Not on file  . Number of children: Not on file  . Years of education: Not on file  . Highest education level: Not on file  Occupational History  . Not on file  Social Needs  . Financial resource strain: Not on file  . Food insecurity    Worry: Not on file    Inability: Not on file  .  Transportation needs    Medical: Not on file    Non-medical: Not on file  Tobacco Use  . Smoking status: Current Every Day Smoker    Packs/day: 1.00    Years: 30.00    Pack years: 30.00    Types: Cigarettes  . Smokeless tobacco: Never Used  Substance and Sexual Activity  . Alcohol use: No  . Drug use: Not on file  . Sexual activity: Not on file  Lifestyle  . Physical activity    Days per week: Not on file    Minutes per session: Not on file  . Stress: Not on file  Relationships  . Social Herbalist on phone: Not on file    Gets together: Not on file    Attends religious service: Not on file    Active member of club or organization: Not on file    Attends meetings of clubs or organizations: Not on file    Relationship status: Not on file  . Intimate partner violence    Fear of current or ex partner: Not on file    Emotionally abused: Not on file    Physically abused: Not on file    Forced sexual activity: Not on file  Other Topics Concern  . Not on file  Social History Narrative  . Not on file      Review of Systems  Constitutional: Negative for chills, fatigue and unexpected weight change.  HENT: Negative for congestion, postnasal drip, rhinorrhea, sneezing and sore throat.   Respiratory: Negative for cough, chest tightness, shortness of breath and wheezing.   Cardiovascular: Negative for chest pain and palpitations.  Gastrointestinal: Positive for abdominal distention, abdominal pain, constipation and nausea. Negative for diarrhea and vomiting.  Endocrine: Negative for cold intolerance, heat intolerance, polydipsia and polyuria.       Recent thyroid panel was normal   Musculoskeletal: Positive for arthralgias and back pain. Negative for joint swelling and neck pain.  Skin: Negative for rash.  Allergic/Immunologic: Negative for environmental allergies.  Neurological: Negative for dizziness, tremors, numbness and headaches.  Hematological: Negative for  adenopathy. Does not bruise/bleed easily.  Psychiatric/Behavioral: Negative for behavioral problems (Depression), sleep disturbance and suicidal ideas. The patient is nervous/anxious.     Today's Vitals   06/08/19 1611  BP: 117/75  Pulse: 73  Resp: 16  SpO2: 95%  Weight: 149 lb 12.8 oz (67.9 kg)  Height: 5\' 6"  (1.676 m)   Body mass index is 24.18 kg/m.  Physical Exam Vitals signs and nursing note reviewed.  Constitutional:      General: She is not in acute distress.    Appearance: Normal appearance. She is well-developed. She is not diaphoretic.  HENT:     Head: Normocephalic and atraumatic.     Mouth/Throat:     Pharynx: No oropharyngeal  exudate.  Eyes:     Pupils: Pupils are equal, round, and reactive to light.  Neck:     Musculoskeletal: Normal range of motion and neck supple.     Thyroid: No thyromegaly.     Vascular: No JVD.     Trachea: No tracheal deviation.  Cardiovascular:     Rate and Rhythm: Normal rate and regular rhythm.     Heart sounds: Normal heart sounds. No murmur. No friction rub. No gallop.   Pulmonary:     Effort: Pulmonary effort is normal. No respiratory distress.     Breath sounds: Normal breath sounds. No wheezing or rales.  Chest:     Chest wall: No tenderness.  Abdominal:     General: Bowel sounds are normal. There is distension.     Palpations: Abdomen is soft.     Tenderness: There is abdominal tenderness. There is guarding.  Musculoskeletal: Normal range of motion.  Lymphadenopathy:     Cervical: No cervical adenopathy.  Skin:    General: Skin is warm and dry.  Neurological:     Mental Status: She is alert and oriented to person, place, and time.     Cranial Nerves: No cranial nerve deficit.  Psychiatric:        Behavior: Behavior normal.        Thought Content: Thought content normal.        Judgment: Judgment normal.   Assessment/Plan: 1. Right upper quadrant pain Reviewed abdominal ultrasound results with the patient. Shows  some thickening of the gallbladder wall. Will get HIDA scan for further evaluation.  - NM Hepato W/Eject Fract; Future  2. Other specified diseases of gallbladder Reviewed abdominal ultrasound results with the patient. Shows some thickening of the gallbladder wall. Will get HIDA scan for further evaluation.  - NM Hepato W/Eject Fract; Future  3. Primary generalized (osteo)arthritis Renew prescription for tramadol which may be taken up to twice daily as needed for pain. New prescription sent to her pharmacy.  - traMADol-acetaminophen (ULTRACET) 37.5-325 MG tablet; Take 1 tablet by mouth 2 (two) times daily as needed.  Dispense: 60 tablet; Refill: 1  General Counseling: jazmyne bassi understanding of the findings of todays visit and agrees with plan of treatment. I have discussed any further diagnostic evaluation that may be needed or ordered today. We also reviewed her medications today. she has been encouraged to call the office with any questions or concerns that should arise related to todays visit.  This patient was seen by Leretha Pol FNP Collaboration with Dr Lavera Guise as a part of collaborative care agreement  Orders Placed This Encounter  Procedures  . NM Hepato W/Eject Fract    Meds ordered this encounter  Medications  . traMADol-acetaminophen (ULTRACET) 37.5-325 MG tablet    Sig: Take 1 tablet by mouth 2 (two) times daily as needed.    Dispense:  60 tablet    Refill:  1    Patient has good RX coupon to use for this prescription.    Order Specific Question:   Supervising Provider    Answer:   Lavera Guise [id number]    Time spent: 60 Minutes      Dr Lavera Guise Internal medicine

## 2019-06-09 LAB — COMPREHENSIVE METABOLIC PANEL
ALT: 10 IU/L (ref 0–32)
AST: 14 IU/L (ref 0–40)
Albumin/Globulin Ratio: 1.8 (ref 1.2–2.2)
Albumin: 4.3 g/dL (ref 3.8–4.8)
Alkaline Phosphatase: 139 IU/L — ABNORMAL HIGH (ref 39–117)
BUN/Creatinine Ratio: 21 (ref 12–28)
BUN: 22 mg/dL (ref 8–27)
Bilirubin Total: 0.3 mg/dL (ref 0.0–1.2)
CO2: 20 mmol/L (ref 20–29)
Calcium: 9.9 mg/dL (ref 8.7–10.3)
Chloride: 106 mmol/L (ref 96–106)
Creatinine, Ser: 1.03 mg/dL — ABNORMAL HIGH (ref 0.57–1.00)
GFR calc Af Amer: 65 mL/min/{1.73_m2} (ref >59)
GFR calc non Af Amer: 57 mL/min/{1.73_m2} — ABNORMAL LOW (ref >59)
Globulin, Total: 2.4 g/dL (ref 1.5–4.5)
Glucose: 93 mg/dL (ref 65–99)
Potassium: 4.7 mmol/L (ref 3.5–5.2)
Sodium: 141 mmol/L (ref 134–144)
Total Protein: 6.7 g/dL (ref 6.0–8.5)

## 2019-06-09 LAB — CBC WITH DIFFERENTIAL/PLATELET
Basophils Absolute: 0.1 10*3/uL (ref 0.0–0.2)
Basos: 2 %
EOS (ABSOLUTE): 0.2 10*3/uL (ref 0.0–0.4)
Eos: 4 %
Hematocrit: 43.4 % (ref 34.0–46.6)
Hemoglobin: 15.1 g/dL (ref 11.1–15.9)
Immature Grans (Abs): 0 10*3/uL (ref 0.0–0.1)
Immature Granulocytes: 0 %
Lymphocytes Absolute: 1.5 10*3/uL (ref 0.7–3.1)
Lymphs: 28 %
MCH: 33.3 pg — ABNORMAL HIGH (ref 26.6–33.0)
MCHC: 34.8 g/dL (ref 31.5–35.7)
MCV: 96 fL (ref 79–97)
Monocytes Absolute: 0.5 10*3/uL (ref 0.1–0.9)
Monocytes: 10 %
Neutrophils Absolute: 3 10*3/uL (ref 1.4–7.0)
Neutrophils: 56 %
Platelets: 233 10*3/uL (ref 150–450)
RBC: 4.54 x10E6/uL (ref 3.77–5.28)
RDW: 12.6 % (ref 11.7–15.4)
WBC: 5.4 10*3/uL (ref 3.4–10.8)

## 2019-06-09 LAB — VITAMIN D 1,25 DIHYDROXY
Vitamin D 1, 25 (OH)2 Total: 24 pg/mL
Vitamin D2 1, 25 (OH)2: <10 pg/mL
Vitamin D3 1, 25 (OH)2: 24 pg/mL

## 2019-06-09 LAB — LIPID PANEL
Chol/HDL Ratio: 3.3 ratio (ref 0.0–4.4)
Cholesterol, Total: 194 mg/dL (ref 100–199)
HDL: 58 mg/dL (ref >39)
LDL Calculated: 121 mg/dL — ABNORMAL HIGH (ref 0–99)
Triglycerides: 74 mg/dL (ref 0–149)
VLDL Cholesterol Cal: 15 mg/dL (ref 5–40)

## 2019-06-09 LAB — T4, FREE: Free T4: 1.04 ng/dL (ref 0.82–1.77)

## 2019-06-09 LAB — TSH: TSH: 0.49 u[IU]/mL (ref 0.450–4.500)

## 2019-06-10 NOTE — Progress Notes (Signed)
Reviewed with patient at her visit.

## 2019-06-17 ENCOUNTER — Other Ambulatory Visit: Payer: Self-pay

## 2019-06-17 ENCOUNTER — Encounter
Admission: RE | Admit: 2019-06-17 | Discharge: 2019-06-17 | Disposition: A | Discharge status: Home / Self Care | Payer: Medicare Other | Source: Ambulatory Visit | Attending: Nurse Practitioner | Admitting: Nurse Practitioner

## 2019-06-17 DIAGNOSIS — R1011 Right upper quadrant pain: Secondary | ICD-10-CM | POA: Insufficient documentation

## 2019-06-17 DIAGNOSIS — K828 Other specified diseases of gallbladder: Secondary | ICD-10-CM | POA: Diagnosis present

## 2019-06-17 MED ORDER — TECHNETIUM TC 99M MEBROFENIN IV KIT
5.3200 | PACK | Freq: Once | INTRAVENOUS | Status: AC | PRN
  Administered 2019-06-17: 5.32 via INTRAVENOUS

## 2019-06-17 NOTE — Progress Notes (Signed)
Negative HIDA scan. Review with patient at visit 07/07/2019

## 2019-06-26 ENCOUNTER — Other Ambulatory Visit: Payer: Self-pay

## 2019-06-26 MED ORDER — SIMVASTATIN 20 MG PO TABS: 20.0000 mg | ORAL_TABLET | Freq: Every day | ORAL | 3 refills | Status: DC

## 2019-07-07 ENCOUNTER — Ambulatory Visit (INDEPENDENT_AMBULATORY_CARE_PROVIDER_SITE_OTHER): Payer: Medicare Other | Admitting: Nurse Practitioner

## 2019-07-07 ENCOUNTER — Other Ambulatory Visit: Payer: Self-pay

## 2019-07-07 ENCOUNTER — Encounter: Payer: Self-pay | Admitting: Nurse Practitioner

## 2019-07-07 VITALS — BP 133/77 | HR 76 | Temp 97.9°F | Resp 16 | Ht 67.0 in | Wt 150.0 lb

## 2019-07-07 DIAGNOSIS — K828 Other specified diseases of gallbladder: Secondary | ICD-10-CM | POA: Diagnosis not present

## 2019-07-07 DIAGNOSIS — R109 Unspecified abdominal pain: Secondary | ICD-10-CM | POA: Diagnosis not present

## 2019-07-07 NOTE — Progress Notes (Signed)
[facility] [place], [place] [address]  Internal MEDICINE  Office Visit Note  Patient Name: Joy Carpenter  [id number]  [id number]  Date of Service: 07/18/2019  Chief Complaint  Patient presents with  . Follow-up    HIDA scan    The patient continues to have left abdominal pain, about 1 inch below the lower rib cage. Has had this for some time. Was told she did have hernia. Also diagnosed with abdominal aortic aneurism. Primary focus has been on the aneurism. This has been stable. Most recent ultrasound was done 04/12/2019, and aneurism is stable. Pain in left abdomen now getting worse. Now feeling like belly is getting bloated and "blowing up" much more often. This is worse when she is lifting and exerting herself .Food feels like it's getting stuck at the end of esophagus. However, eating does not seem to make the bloating and abdominal pain any worse. She did have abdominal ultrasound. This showed stable aortic aneurysm. Also indicates a thickened gallbladder wall. There was no sludge or gallstones noted. Sludge has been present on prior abdominal ultrasounds. She has had HIDA scan since her last visit and results were normal.       Current Medication: Outpatient Encounter Medications as of 07/07/2019  Medication Sig  . ALPRAZolam (XANAX) 0.25 MG tablet Take 1 tablet (0.25 mg total) by mouth daily as needed for anxiety. for anxiety  . levothyroxine (SYNTHROID) 50 MCG tablet TAKE 1 TABLET BY MOUTH EVERY MORNING 30 MINUTES PRIOR TO EATING FOOD  . meloxicam (MOBIC) 15 MG tablet Take 1/2 tab po daily  . pantoprazole (PROTONIX) 40 MG tablet Take 1 tablet (40 mg total) by mouth daily.  . simvastatin (ZOCOR) 20 MG tablet Take 1 tablet (20 mg total) by mouth daily.  . traMADol-acetaminophen (ULTRACET) 37.5-325 MG tablet Take 1 tablet by mouth 2 (two) times daily as needed.  . triamcinolone (KENALOG) 0.025 % cream Apply 1 application topically 2 (two) times daily.   . valACYclovir (VALTREX) 500 MG tablet Take 1 tablet (500 mg total) by mouth 2 (two) times daily.  Marland Kitchen zolpidem (AMBIEN) 10 MG tablet Take 1 tablet (10 mg total) by mouth at bedtime.   No facility-administered encounter medications on file as of 07/07/2019.     Surgical History: Past Surgical History:  Procedure Laterality Date  . COLONOSCOPY    . COLONOSCOPY WITH PROPOFOL N/A 06/14/2016   Procedure: COLONOSCOPY WITH PROPOFOL;  Surgeon: Lucilla Lame, MD;  Location: [facility];  Service: Endoscopy;  Laterality: N/A;  . ESOPHAGOGASTRODUODENOSCOPY (EGD) WITH PROPOFOL N/A 06/14/2016   Procedure: ESOPHAGOGASTRODUODENOSCOPY (EGD) WITH PROPOFOL;  Surgeon: Lucilla Lame, MD;  Location: [facility];  Service: Endoscopy;  Laterality: N/A;  . TUBAL LIGATION      Medical History: Past Medical History:  Diagnosis Date  . Anxiety   . Aortic aneurysm ([place])   . Carpal tunnel syndrome 2019  . Depression   . Emphysema of lung ([place])    "mild"  . GERD (gastroesophageal reflux disease)   . Headache    daily - stress  . Herpes   . Hyperlipidemia   . Insomnia   . Osteoporosis   . Vertigo    1 episode - approx 2010   . Weakness of both legs    intermittent    Family History: Family History  Problem Relation Age of Onset  . Leukemia Mother   . Aneurysm Other     Social History   Socioeconomic History  .  Marital status: Divorced    Spouse name: Not on file  . Number of children: Not on file  . Years of education: Not on file  . Highest education level: Not on file  Occupational History  . Not on file  Social Needs  . Financial resource strain: Not on file  . Food insecurity    Worry: Not on file    Inability: Not on file  . Transportation needs    Medical: Not on file    Non-medical: Not on file  Tobacco Use  . Smoking status: Current Every Day Smoker    Packs/day: 1.00    Years: 30.00    Pack years: 30.00    Types: Cigarettes  . Smokeless tobacco: Never Used   Substance and Sexual Activity  . Alcohol use: No  . Drug use: Not on file  . Sexual activity: Not on file  Lifestyle  . Physical activity    Days per week: Not on file    Minutes per session: Not on file  . Stress: Not on file  Relationships  . Social Herbalist on phone: Not on file    Gets together: Not on file    Attends religious service: Not on file    Active member of club or organization: Not on file    Attends meetings of clubs or organizations: Not on file    Relationship status: Not on file  . Intimate partner violence    Fear of current or ex partner: Not on file    Emotionally abused: Not on file    Physically abused: Not on file    Forced sexual activity: Not on file  Other Topics Concern  . Not on file  Social History Narrative  . Not on file      Review of Systems  Constitutional: Negative for chills, fatigue and unexpected weight change.  HENT: Negative for congestion, postnasal drip, rhinorrhea, sneezing and sore throat.   Respiratory: Negative for cough, chest tightness, shortness of breath and wheezing.   Cardiovascular: Negative for chest pain and palpitations.  Gastrointestinal: Positive for abdominal distention, abdominal pain and constipation. Negative for diarrhea, nausea and vomiting.       Belly pain is stable.   Endocrine: Negative for cold intolerance, heat intolerance, polydipsia and polyuria.  Musculoskeletal: Positive for arthralgias and back pain. Negative for joint swelling and neck pain.  Skin: Negative for rash.  Allergic/Immunologic: Negative for environmental allergies.  Neurological: Negative for dizziness, tremors, numbness and headaches.  Hematological: Negative for adenopathy. Does not bruise/bleed easily.  Psychiatric/Behavioral: Negative for behavioral problems (Depression), sleep disturbance and suicidal ideas. The patient is nervous/anxious.      Today's Vitals   07/07/19 1547  BP: 133/77  Pulse: 76  Resp: 16   Temp: 97.9 F (36.6 C)  SpO2: 97%  Weight: 150 lb (68 kg)  Height: 5\' 7"  (1.702 m)   Body mass index is 23.49 kg/m.  Physical Exam Vitals signs and nursing note reviewed.  Constitutional:      General: She is not in acute distress.    Appearance: Normal appearance. She is well-developed. She is not diaphoretic.  HENT:     Head: Normocephalic and atraumatic.     Mouth/Throat:     Pharynx: No oropharyngeal exudate.  Eyes:     Pupils: Pupils are equal, round, and reactive to light.  Neck:     Musculoskeletal: Normal range of motion and neck supple.     Thyroid:  No thyromegaly.     Vascular: No JVD.     Trachea: No tracheal deviation.  Cardiovascular:     Rate and Rhythm: Normal rate and regular rhythm.     Heart sounds: Normal heart sounds. No murmur. No friction rub. No gallop.   Pulmonary:     Effort: Pulmonary effort is normal. No respiratory distress.     Breath sounds: Normal breath sounds. No wheezing or rales.  Chest:     Chest wall: No tenderness.  Abdominal:     General: Bowel sounds are normal. There is no distension.     Palpations: Abdomen is soft.     Tenderness: There is abdominal tenderness. There is no guarding.  Musculoskeletal: Normal range of motion.  Lymphadenopathy:     Cervical: No cervical adenopathy.  Skin:    General: Skin is warm and dry.  Neurological:     Mental Status: She is alert and oriented to person, place, and time.     Cranial Nerves: No cranial nerve deficit.  Psychiatric:        Behavior: Behavior normal.        Thought Content: Thought content normal.        Judgment: Judgment normal.    Assessment/Plan: 1. Gallbladder sludge Reviewed results of HIDA scan which were without abnormality. Will continue to monitor.   2. Left sided abdominal pain Mildly improved since last check. Will monitor.   General Counseling: Joy Carpenter understanding of the findings of todays visit and agrees with plan of treatment. I have  discussed any further diagnostic evaluation that may be needed or ordered today. We also reviewed her medications today. she has been encouraged to call the office with any questions or concerns that should arise related to todays visit.   This patient was seen by Leretha Pol FNP Collaboration with Dr Lavera Guise as a part of collaborative care agreement  Time spent: 50 Minutes      Dr Lavera Guise Internal medicine

## 2019-09-22 ENCOUNTER — Other Ambulatory Visit: Payer: Self-pay

## 2019-09-22 MED ORDER — LEVOTHYROXINE SODIUM 50 MCG PO TABS: ORAL_TABLET | ORAL | 0 refills | Status: DC

## 2019-10-01 ENCOUNTER — Telehealth: Payer: Self-pay

## 2019-10-01 NOTE — Telephone Encounter (Signed)
LMOM TO CONFIRM AND SCREEN FOR 10-06-19 OV.

## 2019-10-06 ENCOUNTER — Ambulatory Visit (INDEPENDENT_AMBULATORY_CARE_PROVIDER_SITE_OTHER): Payer: Medicare Other | Admitting: Nurse Practitioner

## 2019-10-06 ENCOUNTER — Other Ambulatory Visit: Payer: Self-pay

## 2019-10-06 ENCOUNTER — Encounter: Payer: Self-pay | Admitting: Nurse Practitioner

## 2019-10-06 VITALS — BP 141/89 | HR 74 | Resp 16 | Ht 67.0 in | Wt 153.0 lb

## 2019-10-06 DIAGNOSIS — E039 Hypothyroidism, unspecified: Secondary | ICD-10-CM | POA: Diagnosis not present

## 2019-10-06 DIAGNOSIS — K5904 Chronic idiopathic constipation: Secondary | ICD-10-CM

## 2019-10-06 DIAGNOSIS — F5101 Primary insomnia: Secondary | ICD-10-CM | POA: Diagnosis not present

## 2019-10-06 DIAGNOSIS — Z23 Encounter for immunization: Secondary | ICD-10-CM

## 2019-10-06 DIAGNOSIS — S81811D Laceration without foreign body, right lower leg, subsequent encounter: Secondary | ICD-10-CM

## 2019-10-06 MED ORDER — PNEUMOCOCCAL 13-VAL CONJ VACC IM SUSP: 0.5000 mL | Freq: Once | INTRAMUSCULAR | 0 refills | Status: AC

## 2019-10-06 MED ORDER — ZOLPIDEM TARTRATE 10 MG PO TABS: 10.0000 mg | ORAL_TABLET | Freq: Every day | ORAL | 3 refills | Status: DC

## 2019-10-06 NOTE — Progress Notes (Signed)
[facility] [place], [place] [address]  Internal MEDICINE  Office Visit Note  Patient Name: Joy Carpenter  [id number]  [id number]  Date of Service: 10/11/2019  Chief Complaint  Patient presents with  . Hyperlipidemia  . Gastroesophageal Reflux  . Extremity Laceration    pt would like for you to take a look, work injury    The patient is here for routine follow up. Today, the patient is complaining of significant abrasion/laceration on the left lower extremity. She suffered this injury at work. She is seeing provider which is mandated by [facility] [employer]. She is concerned that it is not healing as quickly as it should. She suffered the initial injury at the very beginning of December. She did not see occupational health provider until ten days later. She is currently on cephalexin 500mg  three times daily and is using silvadene cream two to three times daily. She is keeping the wound clean and dry. She took pictures when injury first occurred and it does appear to be healing. The swelling, redness, and warmth has improved a great deal since the initial injury happened.  Today, she has no new concerns or complaints. Continues to have intermittent left upper quadrant pain. She does see vein and vascular provider early next year. She does have problems sleeping and needs to have a refill of her [place]. She is due to pneumonia vaccine .      Current Medication: Outpatient Encounter Medications as of 10/06/2019  Medication Sig  . ALPRAZolam (XANAX) 0.25 MG tablet Take 1 tablet (0.25 mg total) by mouth daily as needed for anxiety. for anxiety  . levothyroxine (SYNTHROID) 50 MCG tablet TAKE 1 TABLET BY MOUTH EVERY MORNING 30 MINUTES PRIOR TO EATING FOOD  . meloxicam (MOBIC) 15 MG tablet Take 1/2 tab po daily  . pantoprazole (PROTONIX) 40 MG tablet Take 1 tablet (40 mg total) by mouth daily.  . simvastatin (ZOCOR) 20 MG tablet Take 1 tablet (20 mg total) by  mouth daily.  . traMADol-acetaminophen (ULTRACET) 37.5-325 MG tablet Take 1 tablet by mouth 2 (two) times daily as needed.  . triamcinolone (KENALOG) 0.025 % cream Apply 1 application topically 2 (two) times daily.  . valACYclovir (VALTREX) 500 MG tablet Take 1 tablet (500 mg total) by mouth 2 (two) times daily.  Marland Kitchen zolpidem (AMBIEN) 10 MG tablet Take 1 tablet (10 mg total) by mouth at bedtime.  . [DISCONTINUED] zolpidem (AMBIEN) 10 MG tablet Take 1 tablet (10 mg total) by mouth at bedtime.  . [EXPIRED] pneumococcal 13-valent conjugate vaccine (PREVNAR 13) SUSP injection Inject 0.5 mLs into the muscle once for 1 dose.   No facility-administered encounter medications on file as of 10/06/2019.    Surgical History: Past Surgical History:  Procedure Laterality Date  . COLONOSCOPY    . COLONOSCOPY WITH PROPOFOL N/A 06/14/2016   Procedure: COLONOSCOPY WITH PROPOFOL;  Surgeon: Lucilla Lame, MD;  Location: [place];  Service: Endoscopy;  Laterality: N/A;  . ESOPHAGOGASTRODUODENOSCOPY (EGD) WITH PROPOFOL N/A 06/14/2016   Procedure: ESOPHAGOGASTRODUODENOSCOPY (EGD) WITH PROPOFOL;  Surgeon: Lucilla Lame, MD;  Location: [place];  Service: Endoscopy;  Laterality: N/A;  . TUBAL LIGATION      Medical History: Past Medical History:  Diagnosis Date  . Anxiety   . Aortic aneurysm ([place])   . Carpal tunnel syndrome 2019  . Depression   . Emphysema of lung ([place])    "mild"  . GERD (gastroesophageal reflux disease)   . Headache  daily - stress  . Herpes   . Hyperlipidemia   . Insomnia   . Osteoporosis   . Vertigo    1 episode - approx 2010   . Weakness of both legs    intermittent    Family History: Family History  Problem Relation Age of Onset  . Leukemia Mother   . Aneurysm Other     Social History   Socioeconomic History  . Marital status: Divorced    Spouse name: Not on file  . Number of children: Not on file  . Years of education: Not on file  . Highest  education level: Not on file  Occupational History  . Not on file  Tobacco Use  . Smoking status: Current Every Day Smoker    Packs/day: 1.00    Years: 30.00    Pack years: 30.00    Types: Cigarettes  . Smokeless tobacco: Never Used  Substance and Sexual Activity  . Alcohol use: No  . Drug use: Never  . Sexual activity: Not on file  Other Topics Concern  . Not on file  Social History Narrative  . Not on file   Social Determinants of Health   Financial Resource Strain:   . Difficulty of Paying Living Expenses: Not on file  Food Insecurity:   . Worried About Charity fundraiser in the Last Year: Not on file  . Ran Out of Food in the Last Year: Not on file  Transportation Needs:   . Lack of Transportation (Medical): Not on file  . Lack of Transportation (Non-Medical): Not on file  Physical Activity:   . Days of Exercise per Week: Not on file  . Minutes of Exercise per Session: Not on file  Stress:   . Feeling of Stress : Not on file  Social Connections:   . Frequency of Communication with Friends and Family: Not on file  . Frequency of Social Gatherings with Friends and Family: Not on file  . Attends Religious Services: Not on file  . Active Member of Clubs or Organizations: Not on file  . Attends Archivist Meetings: Not on file  . Marital Status: Not on file  Intimate Partner Violence:   . Fear of Current or Ex-Partner: Not on file  . Emotionally Abused: Not on file  . Physically Abused: Not on file  . Sexually Abused: Not on file      Review of Systems  Constitutional: Negative for activity change, chills, fatigue and unexpected weight change.  HENT: Negative for congestion, ear pain, postnasal drip, rhinorrhea, sinus pressure, sinus pain and sore throat.   Respiratory: Positive for cough. Negative for chest tightness, shortness of breath and wheezing.   Cardiovascular: Negative for chest pain and palpitations.  Gastrointestinal: Positive for abdominal  pain and constipation. Negative for abdominal distention, diarrhea, nausea and vomiting.       This is intermittent.   Endocrine: Negative for cold intolerance, heat intolerance, polydipsia and polyuria.       Well controlled thyroid disease  Musculoskeletal: Positive for arthralgias. Negative for back pain, joint swelling and neck pain.       Bilateral wrist pain, stretching into the thumb and first two fingers of her hands .  Skin: Positive for wound. Negative for rash.       Laceration to left lower extremity for which she is seeing occupational health and medical provider recommended per her employer.   Allergic/Immunologic: Positive for environmental allergies.  Neurological: Positive for headaches. Negative  for dizziness, tremors and numbness.  Hematological: Negative for adenopathy. Does not bruise/bleed easily.  Psychiatric/Behavioral: Positive for sleep disturbance. Negative for behavioral problems (Depression) and suicidal ideas. The patient is nervous/anxious.     Today's Vitals   10/06/19 0854  BP: (!) 141/89  Pulse: 74  Resp: 16  SpO2: 98%  Weight: 153 lb (69.4 kg)  Height: 5\' 7"  (1.702 m)   Body mass index is 23.96 kg/m.  Physical Exam Vitals and nursing note reviewed.  Constitutional:      General: She is not in acute distress.    Appearance: Normal appearance. She is well-developed. She is not diaphoretic.  HENT:     Head: Normocephalic and atraumatic.     Mouth/Throat:     Pharynx: No oropharyngeal exudate.  Eyes:     Pupils: Pupils are equal, round, and reactive to light.  Neck:     Thyroid: No thyromegaly.     Vascular: No JVD.     Trachea: No tracheal deviation.  Cardiovascular:     Rate and Rhythm: Normal rate and regular rhythm.     Heart sounds: Normal heart sounds. No murmur. No friction rub. No gallop.   Pulmonary:     Effort: Pulmonary effort is normal. No respiratory distress.     Breath sounds: Normal breath sounds. No wheezing or rales.    Chest:     Chest wall: No tenderness.  Abdominal:     General: Bowel sounds are normal.     Palpations: Abdomen is soft.     Tenderness: There is abdominal tenderness.     Comments: Mild tenderness along the left upper quadrant of the abdomen. No mass noted and no abnormalities noted at this time.   Musculoskeletal:        General: Normal range of motion.     Cervical back: Normal range of motion and neck supple.  Lymphadenopathy:     Cervical: No cervical adenopathy.  Skin:    General: Skin is warm and dry.       Neurological:     Mental Status: She is alert and oriented to person, place, and time.     Cranial Nerves: No cranial nerve deficit.  Psychiatric:        Mood and Affect: Mood normal.        Behavior: Behavior normal.        Thought Content: Thought content normal.        Judgment: Judgment normal.    Assessment/Plan: 1. Acquired hypothyroidism Stable. Continue levothyroxine as prescribed   2. Primary insomnia May continue ambien as needed and as prescribed. Refills provided today.  - zolpidem (AMBIEN) 10 MG tablet; Take 1 tablet (10 mg total) by mouth at bedtime.  Dispense: 30 tablet; Refill: 3  3. Chronic idiopathic constipation Continue evening dose miralax to help regulate bowels.   4. Need for vaccination against Streptococcus pneumoniae using pneumococcal conjugate vaccine 13 Prescription for Prevnar 13 sent to her pharmacy for administration.  - pneumococcal 13-valent conjugate vaccine (PREVNAR 13) SUSP injection; Inject 0.5 mLs into the muscle once for 1 dose.  Dispense: 0.5 mL; Refill: 0  5. Laceration of right lower extremity, subsequent encounter Appears to have appropriate healing pattern. Continue cephalexin as prescribed. Use silvadene cream as prescribed. Follow up with occupational health provider as scheduled.   General Counseling: avaiya sawinski understanding of the findings of todays visit and agrees with plan of treatment. I have  discussed any further diagnostic evaluation that may be needed  or ordered today. We also reviewed her medications today. she has been encouraged to call the office with any questions or concerns that should arise related to todays visit.   This patient was seen by [place] with Dr Lavera Guise as a part of collaborative care agreement  Meds ordered this encounter  Medications  . zolpidem (AMBIEN) 10 MG tablet    Sig: Take 1 tablet (10 mg total) by mouth at bedtime.    Dispense:  30 tablet    Refill:  3    Order Specific Question:   Supervising Provider    Answer:   Lavera Guise [id number]  . pneumococcal 13-valent conjugate vaccine (PREVNAR 13) SUSP injection    Sig: Inject 0.5 mLs into the muscle once for 1 dose.    Dispense:  0.5 mL    Refill:  0    Order Specific Question:   Supervising Provider    Answer:   Lavera Guise [id number]    Time spent: 80 Minutes      Dr Lavera Guise Internal medicine

## 2019-10-11 DIAGNOSIS — Z23 Encounter for immunization: Secondary | ICD-10-CM | POA: Insufficient documentation

## 2019-10-11 DIAGNOSIS — S81811A Laceration without foreign body, right lower leg, initial encounter: Secondary | ICD-10-CM | POA: Insufficient documentation

## 2019-10-11 DIAGNOSIS — E039 Hypothyroidism, unspecified: Secondary | ICD-10-CM | POA: Insufficient documentation

## 2019-10-20 ENCOUNTER — Encounter (INDEPENDENT_AMBULATORY_CARE_PROVIDER_SITE_OTHER): Payer: Self-pay | Admitting: Vascular Surgery

## 2019-10-20 ENCOUNTER — Ambulatory Visit (INDEPENDENT_AMBULATORY_CARE_PROVIDER_SITE_OTHER): Payer: Medicare HMO | Admitting: Vascular Surgery

## 2019-10-20 ENCOUNTER — Ambulatory Visit (INDEPENDENT_AMBULATORY_CARE_PROVIDER_SITE_OTHER): Payer: Medicare HMO

## 2019-10-20 ENCOUNTER — Other Ambulatory Visit: Payer: Self-pay

## 2019-10-20 VITALS — BP 119/76 | HR 68 | Resp 12 | Ht 66.0 in | Wt 150.0 lb

## 2019-10-20 DIAGNOSIS — I714 Abdominal aortic aneurysm, without rupture, unspecified: Secondary | ICD-10-CM

## 2019-10-20 DIAGNOSIS — Z720 Tobacco use: Secondary | ICD-10-CM | POA: Diagnosis not present

## 2019-10-20 DIAGNOSIS — I70219 Atherosclerosis of native arteries of extremities with intermittent claudication, unspecified extremity: Secondary | ICD-10-CM | POA: Diagnosis not present

## 2019-10-20 DIAGNOSIS — R1013 Epigastric pain: Secondary | ICD-10-CM | POA: Diagnosis not present

## 2019-10-20 DIAGNOSIS — F17200 Nicotine dependence, unspecified, uncomplicated: Secondary | ICD-10-CM | POA: Diagnosis not present

## 2019-10-20 DIAGNOSIS — E785 Hyperlipidemia, unspecified: Secondary | ICD-10-CM

## 2019-10-20 DIAGNOSIS — I70213 Atherosclerosis of native arteries of extremities with intermittent claudication, bilateral legs: Secondary | ICD-10-CM

## 2019-10-20 DIAGNOSIS — J439 Emphysema, unspecified: Secondary | ICD-10-CM | POA: Diagnosis not present

## 2019-10-20 NOTE — Assessment & Plan Note (Signed)
AAA measures 4.0 cm today which is stable.  We discussed smoking cessation again and she understands the importance of quitting.  We will continue to follow this on 75-month intervals.

## 2019-10-20 NOTE — Progress Notes (Signed)
MRN : [id number]  Joy Carpenter is a 69 y.o. (07/08/1951) female who presents with chief complaint of  Chief Complaint  Patient presents with  . Follow-up    U/S follow up  .  History of Present Illness: Patient returns today in follow up of her aneurysm as well as to check her leg perfusion.  She is doing well.  She does continue to smoke.  No claudication symptoms although she hurt her left ankle at work a few weeks ago and is now having some arthritic pain in her right hip.  Her previous abdominal pain seems stable to improved.  Noninvasive studies today showed normal ABIs of 1.0 on the right and 0.97 on the left with triphasic waveforms. AAA measures 4.0 cm today which is stable.   Current Outpatient Medications  Medication Sig Dispense Refill  . ALPRAZolam (XANAX) 0.25 MG tablet Take 1 tablet (0.25 mg total) by mouth daily as needed for anxiety. for anxiety 30 tablet 3  . levothyroxine (SYNTHROID) 50 MCG tablet TAKE 1 TABLET BY MOUTH EVERY MORNING 30 MINUTES PRIOR TO EATING FOOD 90 tablet 0  . meloxicam (MOBIC) 15 MG tablet Take 1/2 tab po daily 30 tablet 3  . pantoprazole (PROTONIX) 40 MG tablet Take 1 tablet (40 mg total) by mouth daily. 90 tablet 1  . simvastatin (ZOCOR) 20 MG tablet Take 1 tablet (20 mg total) by mouth daily. 90 tablet 3  . traMADol-acetaminophen (ULTRACET) 37.5-325 MG tablet Take 1 tablet by mouth 2 (two) times daily as needed. 60 tablet 1  . valACYclovir (VALTREX) 500 MG tablet Take 1 tablet (500 mg total) by mouth 2 (two) times daily. 60 tablet 2  . zolpidem (AMBIEN) 10 MG tablet Take 1 tablet (10 mg total) by mouth at bedtime. 30 tablet 3  . triamcinolone (KENALOG) 0.025 % cream Apply 1 application topically 2 (two) times daily. (Patient not taking: Reported on 10/20/2019) 80 g 2   No current facility-administered medications for this visit.    Past Medical History:  Diagnosis Date  . Anxiety   . Aortic aneurysm ([place])   . Carpal tunnel syndrome 2019   . Depression   . Emphysema of lung ([place])    "mild"  . GERD (gastroesophageal reflux disease)   . Headache    daily - stress  . Herpes   . Hyperlipidemia   . Insomnia   . Osteoporosis   . Vertigo    1 episode - approx 2010   . Weakness of both legs    intermittent    Past Surgical History:  Procedure Laterality Date  . COLONOSCOPY    . COLONOSCOPY WITH PROPOFOL N/A 06/14/2016   Procedure: COLONOSCOPY WITH PROPOFOL;  Surgeon: Lucilla Lame, MD;  Location: [place];  Service: Endoscopy;  Laterality: N/A;  . ESOPHAGOGASTRODUODENOSCOPY (EGD) WITH PROPOFOL N/A 06/14/2016   Procedure: ESOPHAGOGASTRODUODENOSCOPY (EGD) WITH PROPOFOL;  Surgeon: Lucilla Lame, MD;  Location: [place];  Service: Endoscopy;  Laterality: N/A;  . TUBAL LIGATION      Social History  Substance Use Topics  . Smoking status: Current Every Day Smoker    Packs/day: 1.00    Years: 30.00    Types: Cigarettes  . Smokeless tobacco: Never Used  . Alcohol use No     Family History Half brother with an aneurysms No bleeding or clotting disorders No porphyria. No autoimmune diseases  No Known Allergies   REVIEW OF SYSTEMS(Negative unless checked)  Constitutional: [] ?Weight loss[] ?Fever[] ?Chills Cardiac:[] ?Chest pain[] ?Chest pressure[] ?Palpitations [] ?  Shortness of breath when laying flat [] ?Shortness of breath at rest [] ?Shortness of breath with exertion. Vascular: [] ?Pain in legs with walking[] ?Pain in legsat rest[] ?Pain in legs when laying flat [] ?Claudication [] ?Pain in feet when walking [] ?Pain in feet at rest [] ?Pain in feet when laying flat [] ?History of DVT [] ?Phlebitis [] ?Swelling in legs [] ?Varicose veins [] ?Non-healing ulcers Pulmonary: [] ?Uses home oxygen [] ?Productive cough[] ?Hemoptysis [] ?Wheeze [x] ?COPD [] ?Asthma Neurologic: [] ?Dizziness [] ?Blackouts [] ?Seizures [] ?History of stroke [] ?History of  TIA[] ?Aphasia [] ?Temporary blindness[] ?Dysphagia [] ?Weaknessor numbness in arms [] ?Weakness or numbnessin legs Musculoskeletal: [x] ?Arthritis [] ?Joint swelling [] ?Joint pain [] ?Low back pain Hematologic:[] ?Easy bruising[] ?Easy bleeding [] ?Hypercoagulable state [] ?Anemic  Gastrointestinal:[] ?Blood in stool[] ?Vomiting blood[x] ?Gastroesophageal reflux/heartburn[x] ?Abdominal pain Genitourinary: [] ?Chronic kidney disease [] ?Difficulturination [] ?Frequenturination [] ?Burning with urination[] ?Hematuria Skin: [] ?Rashes [] ?Ulcers [] ?Wounds Psychological: [] ?History of anxiety[] ?History of major depression.    Physical Examination  BP 119/76 (BP Location: Right Arm)   Pulse 68   Resp 12   Ht 5\' 6"  (1.676 m)   Wt 150 lb (68 kg)   BMI 24.21 kg/m  Gen:  WD/WN, NAD.  Appears younger than stated age Head: [place]/AT, No temporalis wasting. Ear/Nose/Throat: Hearing grossly intact, nares w/o erythema or drainage Eyes: Conjunctiva clear. Sclera non-icteric Neck: Supple.  Trachea midline Pulmonary:  Good air movement, no use of accessory muscles.  Cardiac: RRR, no JVD Vascular:  Vessel Right Left  Radial Palpable Palpable                          PT Palpable Palpable  DP Palpable Palpable   Gastrointestinal: soft, non-tender/non-distended.  Mildly increased aortic impulse Musculoskeletal: M/S 5/5 throughout.  No deformity or atrophy.  No edema. Neurologic: Sensation grossly intact in extremities.  Symmetrical.  Speech is fluent.  Psychiatric: Judgment intact, Mood & affect appropriate for pt's clinical situation. Dermatologic: No rashes or ulcers noted.  No cellulitis or open wounds.       Labs No results found for this or any previous visit (from the past 2160 hour(s)).  Radiology No results found.  Assessment/Plan Hyperlipidemia lipid control important in reducing the progression of atherosclerotic disease. Continue statin  therapy   Tobacco use disorder We had a discussion for approximately3-51minutes regarding the absolute need for smoking cessation due to the deleterious nature of tobacco on the vascular system, particularly with aneurysm growth in her case. We discussed the tobacco use would diminish patency of any intervention, and likely significantly worsen progressio of disease. We discussed multiple agents for quitting including replacement therapy or medications to reduce cravings such as Chantix. The patient voices their understanding of the importance of smoking cessation.   Abdominal pain, epigastric Although not impossible, this is less likely to be related to a stable aneurysm.  This has been better recently.  Atherosclerosis of native arteries of extremity with intermittent claudication (HCC) Noninvasive studies today showed normal ABIs of 1.0 on the right and 0.97 on the left with triphasic waveforms. Recheck in two years  AAA (abdominal aortic aneurysm) without rupture (HCC) AAA measures 4.0 cm today which is stable.  We discussed smoking cessation again and she understands the importance of quitting.  We will continue to follow this on 23-month intervals.    Leotis Pain, MD  10/20/2019 10:29 AM    This note was created with Dragon medical transcription system.  Any errors from dictation are purely unintentional

## 2019-10-20 NOTE — Patient Instructions (Signed)
Abdominal Aortic Aneurysm  An aneurysm is a bulge in one of the blood vessels that carry blood away from the heart (artery). It happens when blood pushes up against a weak or damaged place in the wall of an artery. An abdominal aortic aneurysm happens in the main artery of the body (aorta). Some aneurysms may not cause problems. If it grows, it can burst or tear, causing bleeding inside the body. This is an emergency. It needs to be treated right away. What are the causes? The exact cause of this condition is not known. What increases the risk? The following may make you more likely to get this condition:  Being a female who is 60 years of age or older.  Being white (Caucasian).  Using tobacco.  Having a family history of aneurysms.  Having the following conditions: ? Hardening of the arteries (arteriosclerosis). ? Inflammation of the walls of an artery (arteritis). ? Certain genetic conditions. ? Being very overweight (obesity). ? An infection in the wall of the aorta (infectious aortitis). ? High cholesterol. ? High blood pressure (hypertension). What are the signs or symptoms? Symptoms depend on the size of the aneurysm and how fast it is growing. Most grow slowly and do not cause any symptoms. If symptoms do occur, they may include:  Pain in the belly (abdomen), side, or back.  Feeling full after eating only small amounts of food.  Feeling a throbbing lump in the belly. Symptoms that the aneurysm has burst (ruptured) include:  Sudden, very bad pain in the belly, side, or back.  Feeling sick to your stomach (nauseous).  Throwing up (vomiting).  Feeling light-headed or passing out. How is this treated? Treatment for this condition depends on:  The size of the aneurysm.  How fast it is growing.  Your age.  Your risk of having it burst. If your aneurysm is smaller than 2 inches (5 cm), your doctor may manage it by:  Checking it often to see if it is getting bigger.  You may have an imaging test (ultrasound) to check it every 3-6 months, every year, or every few years.  Giving you medicines to: ? Control blood pressure. ? Treat pain. ? Fight infection. If your aneurysm is larger than 2 inches (5 cm), you may need surgery to fix it. Follow these instructions at home: Lifestyle  Do not use any products that have nicotine or tobacco in them. This includes cigarettes, e-cigarettes, and chewing tobacco. If you need help quitting, ask your doctor.  Get regular exercise. Ask your doctor what types of exercise are best for you. Eating and drinking  Eat a heart-healthy diet. This includes eating plenty of: ? Fresh fruits and vegetables. ? Whole grains. ? Low-fat (lean) protein. ? Low-fat dairy products.  Avoid foods that are high in saturated fat and cholesterol. These foods include red meat and some dairy products.  Do not drink alcohol if: ? Your doctor tells you not to drink. ? You are pregnant, may be pregnant, or are planning to become pregnant.  If you drink alcohol: ? Limit how much you use to:  0-1 drink a day for women.  0-2 drinks a day for men. ? Be aware of how much alcohol is in your drink. In the [place] one drink equals any of these:  One typical bottle of beer (12 oz).  One-half glass of wine (5 oz).  One shot of hard liquor (1 oz). General instructions  Take over-the-counter and prescription medicines only as   told by your doctor.  Keep your blood pressure within normal limits. Ask your doctor what your blood pressure should be.  Have your blood sugar (glucose) level and cholesterol levels checked regularly. Keep your blood sugar level and cholesterol levels within normal limits.  Avoid heavy lifting and activities that take a lot of effort. Ask your doctor what activities are safe for you.  Keep all follow-up visits as told by your doctor. This is important. ? Talk to your doctor about regular screenings to see if the  aneurysm is getting bigger. Contact a doctor if you:  Have pain in your belly, side, or back.  Have a throbbing feeling in your belly.  Have a family history of aneurysms. Get help right away if you:  Have sudden, bad pain in your belly, side, or back.  Feel sick to your stomach.  Throw up.  Have trouble pooping (constipation).  Have trouble peeing (urinating).  Feel light-headed.  Have a fast heart rate when you stand.  Have sweaty skin that is cold to the touch (clammy).  Have shortness of breath.  Have a fever. These symptoms may be an emergency. Do not wait to see if the symptoms will go away. Get medical help right away. Call your local emergency services (911 in the [place] Do not drive yourself to the hospital. Summary  An aneurysm is a bulge in one of the blood vessels that carry blood away from the heart (artery). Some aneurysms may not cause problems.  You may need to have yours checked often. If it grows, it can burst or tear. This causes bleeding inside the body. It needs to be treated right away.  Follow instructions from your doctor about healthy lifestyle changes.  Keep all follow-up visits as told by your doctor. This is important. This information is not intended to replace advice given to you by your health care provider. Make sure you discuss any questions you have with your health care provider. Document Revised: 01/12/2019 Document Reviewed: 05/03/2018 Elsevier Patient Education  2020 [employer] Inc.  

## 2019-10-20 NOTE — Addendum Note (Signed)
Addended by: Algernon Huxley on: 10/20/2019 10:38 AM   Modules accepted: Orders

## 2019-10-20 NOTE — Assessment & Plan Note (Signed)
Noninvasive studies today showed normal ABIs of 1.0 on the right and 0.97 on the left with triphasic waveforms. Recheck in two years

## 2019-10-26 ENCOUNTER — Other Ambulatory Visit: Payer: Self-pay

## 2019-10-26 DIAGNOSIS — Z124 Encounter for screening for malignant neoplasm of cervix: Secondary | ICD-10-CM

## 2019-10-26 DIAGNOSIS — Z1239 Encounter for other screening for malignant neoplasm of breast: Secondary | ICD-10-CM

## 2019-10-26 DIAGNOSIS — F411 Generalized anxiety disorder: Secondary | ICD-10-CM

## 2019-10-26 DIAGNOSIS — K219 Gastro-esophageal reflux disease without esophagitis: Secondary | ICD-10-CM

## 2019-10-26 DIAGNOSIS — E559 Vitamin D deficiency, unspecified: Secondary | ICD-10-CM

## 2019-10-26 DIAGNOSIS — Z79899 Other long term (current) drug therapy: Secondary | ICD-10-CM

## 2019-10-26 DIAGNOSIS — F5101 Primary insomnia: Secondary | ICD-10-CM

## 2019-10-26 DIAGNOSIS — R109 Unspecified abdominal pain: Secondary | ICD-10-CM

## 2019-10-26 DIAGNOSIS — Z0001 Encounter for general adult medical examination with abnormal findings: Secondary | ICD-10-CM

## 2019-10-26 DIAGNOSIS — R5383 Other fatigue: Secondary | ICD-10-CM

## 2019-10-26 DIAGNOSIS — R300 Dysuria: Secondary | ICD-10-CM

## 2019-10-26 MED ORDER — PANTOPRAZOLE SODIUM 40 MG PO TBEC: 40.0000 mg | DELAYED_RELEASE_TABLET | Freq: Every day | ORAL | 1 refills | Status: DC

## 2019-12-16 DIAGNOSIS — Z0100 Encounter for examination of eyes and vision without abnormal findings: Secondary | ICD-10-CM | POA: Diagnosis not present

## 2019-12-16 DIAGNOSIS — H25813 Combined forms of age-related cataract, bilateral: Secondary | ICD-10-CM | POA: Diagnosis not present

## 2019-12-16 DIAGNOSIS — H50412 Cyclotropia, left eye: Secondary | ICD-10-CM | POA: Diagnosis not present

## 2019-12-16 DIAGNOSIS — H40013 Open angle with borderline findings, low risk, bilateral: Secondary | ICD-10-CM | POA: Diagnosis not present

## 2019-12-16 DIAGNOSIS — H524 Presbyopia: Secondary | ICD-10-CM | POA: Diagnosis not present

## 2019-12-16 DIAGNOSIS — H52223 Regular astigmatism, bilateral: Secondary | ICD-10-CM | POA: Diagnosis not present

## 2019-12-16 DIAGNOSIS — H5213 Myopia, bilateral: Secondary | ICD-10-CM | POA: Diagnosis not present

## 2019-12-21 ENCOUNTER — Other Ambulatory Visit: Payer: Self-pay

## 2019-12-21 MED ORDER — LEVOTHYROXINE SODIUM 50 MCG PO TABS: ORAL_TABLET | ORAL | 0 refills | Status: DC

## 2020-01-12 ENCOUNTER — Other Ambulatory Visit: Payer: Self-pay

## 2020-01-12 ENCOUNTER — Telehealth: Payer: Self-pay

## 2020-01-12 ENCOUNTER — Encounter: Payer: Self-pay | Admitting: Nurse Practitioner

## 2020-01-12 ENCOUNTER — Ambulatory Visit (INDEPENDENT_AMBULATORY_CARE_PROVIDER_SITE_OTHER): Payer: Medicare HMO | Admitting: Nurse Practitioner

## 2020-01-12 VITALS — BP 146/93 | HR 97 | Resp 16 | Ht 66.0 in | Wt 151.2 lb

## 2020-01-12 DIAGNOSIS — M150 Primary generalized (osteo)arthritis: Secondary | ICD-10-CM

## 2020-01-12 DIAGNOSIS — R319 Hematuria, unspecified: Secondary | ICD-10-CM | POA: Diagnosis not present

## 2020-01-12 DIAGNOSIS — R109 Unspecified abdominal pain: Secondary | ICD-10-CM | POA: Diagnosis not present

## 2020-01-12 DIAGNOSIS — N390 Urinary tract infection, site not specified: Secondary | ICD-10-CM | POA: Diagnosis not present

## 2020-01-12 LAB — POCT URINALYSIS DIPSTICK
Bilirubin, UA: NEGATIVE
Glucose, UA: NEGATIVE
Ketones, UA: NEGATIVE
Nitrite, UA: NEGATIVE
Protein, UA: POSITIVE — AB
Spec Grav, UA: 1.02 (ref 1.010–1.025)
Urobilinogen, UA: 0.2 E.U./dL
pH, UA: 5 (ref 5.0–8.0)

## 2020-01-12 MED ORDER — TRAMADOL-ACETAMINOPHEN 37.5-325 MG PO TABS: 1.0000 | ORAL_TABLET | Freq: Two times a day (BID) | ORAL | 1 refills | Status: DC | PRN

## 2020-01-12 MED ORDER — CIPROFLOXACIN HCL 500 MG PO TABS: 500.0000 mg | ORAL_TABLET | Freq: Two times a day (BID) | ORAL | 0 refills | Status: DC

## 2020-01-12 NOTE — Telephone Encounter (Signed)
PT CALLED STATING THAT THERE IS A DULL PAIN RIGHT ABOVE WHERE HER RIGHT OVARIES ARE LOCATED. PAIN HAS BEEN THERE SINCE THE WEEKEND. PAIN WAS WORSE DURING THE WEEKEND, BUT ONLY DULL NOW. PAIN SWITCHED SIDES FROM LEFT TO RIGHT SIDE. PAIN IS NOT RADIATING ANYWHERE ELSE. THIS MORNING SHE HAS SWEATS BUT NO FEVER. MADE AN APPT TODAY FOR 1:45, BUT ADVISED PT THAT IF THE PAIN IS UNBEARABLE THAT SHE SHOULD GO TO THE ER. PT UNDERSTOOD.

## 2020-01-12 NOTE — Progress Notes (Signed)
[facility] [place], [place] [address]  Internal MEDICINE  Office Visit Note  Patient Name: Joy Carpenter  [id number]  [id number]  Date of Service: 01/23/2020   Pt is here for a sick visit.   Chief Complaint  Patient presents with  . Pain    pain by ovaries/hip on right side, right side pain near abdomen, started of left side and moved to right side and stayed on right side, constant dull pain for four days, gets worse when sitting, doesnt bother her when she sleeps at night      The patient presents for sick visit. She is having some right sided abdominal pain. At times, the pain is in flank area, other times, it is in right suprapubic area. Has been going on for about a week. She has been a little nausea today, but not vomiting. Has had normal appetite. She does not believe she has had any fever. Denies cramping and diarrhea. She denies pain or burning with urination. She has no blood in her urine that she has noted.        Current Medication:  Outpatient Encounter Medications as of 01/12/2020  Medication Sig  . ALPRAZolam (XANAX) 0.25 MG tablet Take 1 tablet (0.25 mg total) by mouth daily as needed for anxiety. for anxiety  . levothyroxine (SYNTHROID) 50 MCG tablet TAKE 1 TABLET BY MOUTH EVERY MORNING 30 MINUTES PRIOR TO EATING FOOD  . meloxicam (MOBIC) 15 MG tablet Take 1/2 tab po daily  . pantoprazole (PROTONIX) 40 MG tablet Take 1 tablet (40 mg total) by mouth daily.  . simvastatin (ZOCOR) 20 MG tablet Take 1 tablet (20 mg total) by mouth daily.  . traMADol-acetaminophen (ULTRACET) 37.5-325 MG tablet Take 1 tablet by mouth 2 (two) times daily as needed.  . triamcinolone (KENALOG) 0.025 % cream Apply 1 application topically 2 (two) times daily.  . valACYclovir (VALTREX) 500 MG tablet Take 1 tablet (500 mg total) by mouth 2 (two) times daily.  Marland Kitchen zolpidem (AMBIEN) 10 MG tablet Take 1 tablet (10 mg total) by mouth at bedtime.  . [DISCONTINUED]  traMADol-acetaminophen (ULTRACET) 37.5-325 MG tablet Take 1 tablet by mouth 2 (two) times daily as needed.  . ciprofloxacin (CIPRO) 500 MG tablet Take 1 tablet (500 mg total) by mouth 2 (two) times daily.   No facility-administered encounter medications on file as of 01/12/2020.      Medical History: Past Medical History:  Diagnosis Date  . Anxiety   . Aortic aneurysm ([place])   . Carpal tunnel syndrome 2019  . Depression   . Emphysema of lung ([place])    "mild"  . GERD (gastroesophageal reflux disease)   . Headache    daily - stress  . Herpes   . Hyperlipidemia   . Insomnia   . Osteoporosis   . Vertigo    1 episode - approx 2010   . Weakness of both legs    intermittent     Today's Vitals   01/12/20 1347  BP: (!) 146/93  Pulse: 97  Resp: 16  SpO2: 92%  Weight: 151 lb 3.2 oz (68.6 kg)  Height: 5\' 6"  (1.676 m)   Body mass index is 24.4 kg/m.  Review of Systems  Constitutional: Positive for fatigue. Negative for chills, fever and unexpected weight change.  HENT: Negative for congestion, postnasal drip, rhinorrhea, sneezing and sore throat.   Respiratory: Negative for cough, chest tightness, shortness of breath and wheezing.   Cardiovascular: Negative for chest  pain and palpitations.  Gastrointestinal: Positive for nausea. Negative for abdominal pain, constipation and diarrhea.  Genitourinary: Positive for flank pain and pelvic pain. Negative for dysuria and frequency.  Musculoskeletal: Positive for back pain. Negative for arthralgias, joint swelling and neck pain.  Skin: Negative for rash.  Neurological: Negative for dizziness, tremors, numbness and headaches.  Hematological: Negative for adenopathy. Does not bruise/bleed easily.  Psychiatric/Behavioral: Negative for behavioral problems (Depression), sleep disturbance and suicidal ideas. The patient is not nervous/anxious.     Physical Exam Vitals and nursing note reviewed.  Constitutional:      General: She is not  in acute distress.    Appearance: Normal appearance. She is well-developed. She is not diaphoretic.  HENT:     Head: Normocephalic and atraumatic.     Nose: Nose normal.     Mouth/Throat:     Pharynx: No oropharyngeal exudate.  Eyes:     Pupils: Pupils are equal, round, and reactive to light.  Neck:     Thyroid: No thyromegaly.     Vascular: No JVD.     Trachea: No tracheal deviation.  Cardiovascular:     Rate and Rhythm: Normal rate and regular rhythm.     Heart sounds: Normal heart sounds. No murmur. No friction rub. No gallop.   Pulmonary:     Effort: Pulmonary effort is normal. No respiratory distress.     Breath sounds: Normal breath sounds. No wheezing or rales.  Chest:     Chest wall: No tenderness.  Abdominal:     General: Bowel sounds are normal.     Palpations: Abdomen is soft.     Tenderness: There is abdominal tenderness.  Genitourinary:    Comments: The urine sample is positive for moderate WBC, trace blood, and protein.  Musculoskeletal:        General: Normal range of motion.     Cervical back: Normal range of motion and neck supple.  Lymphadenopathy:     Cervical: No cervical adenopathy.  Skin:    General: Skin is warm and dry.  Neurological:     Mental Status: She is alert and oriented to person, place, and time.     Cranial Nerves: No cranial nerve deficit.  Psychiatric:        Behavior: Behavior normal.        Thought Content: Thought content normal.        Judgment: Judgment normal.    Assessment/Plan: 1. Urinary tract infection with hematuria, site unspecified Urine sample positive for moderate WBC, trace blood, and protein. Treat for infection with cipro 500mg  bid for 7 days. Send urine for culture and sensitivity and adjust antibiotics as indicated.  - CULTURE, URINE COMPREHENSIVE - ciprofloxacin (CIPRO) 500 MG tablet; Take 1 tablet (500 mg total) by mouth 2 (two) times daily.  Dispense: 14 tablet; Refill: 0  2. Abdominal pain, unspecified  abdominal location Urine sample positive for moderate WBC, trace blood, and protein. Treat for infection with cipro 500mg  bid for 7 days. Send urine for culture and sensitivity and adjust antibiotics as indicated.  - POCT Urinalysis Dipstick  3. Primary generalized (osteo)arthritis May take previously prescribed tramadol/APAP 31.5/325mg  tablets twice daily as needed for pain. A new prescriptoin was sent to her pharmacy today.  - traMADol-acetaminophen (ULTRACET) 37.5-325 MG tablet; Take 1 tablet by mouth 2 (two) times daily as needed.  Dispense: 60 tablet; Refill: 1  General Counseling: rickiyah greenwald understanding of the findings of todays visit and agrees with plan of treatment.  I have discussed any further diagnostic evaluation that may be needed or ordered today. We also reviewed her medications today. she has been encouraged to call the office with any questions or concerns that should arise related to todays visit.    Counseling:  This patient was seen by Leretha Pol FNP Collaboration with Dr Lavera Guise as a part of collaborative care agreement  Orders Placed This Encounter  Procedures  . CULTURE, URINE COMPREHENSIVE  . POCT Urinalysis Dipstick    Meds ordered this encounter  Medications  . ciprofloxacin (CIPRO) 500 MG tablet    Sig: Take 1 tablet (500 mg total) by mouth 2 (two) times daily.    Dispense:  14 tablet    Refill:  0    Order Specific Question:   Supervising Provider    Answer:   Lavera Guise [id number]  . traMADol-acetaminophen (ULTRACET) 37.5-325 MG tablet    Sig: Take 1 tablet by mouth 2 (two) times daily as needed.    Dispense:  60 tablet    Refill:  1    Patient has good RX coupon to use for this prescription.    Order Specific Question:   Supervising Provider    Answer:   Lavera Guise [id number]    Time spent: 30 Minutes

## 2020-01-13 ENCOUNTER — Ambulatory Visit: Payer: Medicare Other | Admitting: Adult Health

## 2020-01-16 LAB — CULTURE, URINE COMPREHENSIVE

## 2020-01-16 NOTE — Progress Notes (Signed)
Patient started on cipro at time of visit.

## 2020-01-23 DIAGNOSIS — R109 Unspecified abdominal pain: Secondary | ICD-10-CM | POA: Insufficient documentation

## 2020-01-23 DIAGNOSIS — R319 Hematuria, unspecified: Secondary | ICD-10-CM | POA: Insufficient documentation

## 2020-01-23 DIAGNOSIS — N390 Urinary tract infection, site not specified: Secondary | ICD-10-CM | POA: Insufficient documentation

## 2020-02-01 ENCOUNTER — Telehealth (INDEPENDENT_AMBULATORY_CARE_PROVIDER_SITE_OTHER): Payer: Self-pay | Admitting: Vascular Surgery

## 2020-02-01 ENCOUNTER — Other Ambulatory Visit (INDEPENDENT_AMBULATORY_CARE_PROVIDER_SITE_OTHER): Payer: Self-pay | Admitting: Nurse Practitioner

## 2020-02-01 DIAGNOSIS — I714 Abdominal aortic aneurysm, without rupture, unspecified: Secondary | ICD-10-CM

## 2020-02-01 DIAGNOSIS — R109 Unspecified abdominal pain: Secondary | ICD-10-CM

## 2020-02-01 NOTE — Telephone Encounter (Signed)
She can come in with a mesenteric.  The pain wouldn't be caused by her aneurysm.  If our ultrasound doesn't show anything, she would need to continue to follow up with her PCP for workup.  Can see dew or me, per pt preference

## 2020-02-01 NOTE — Telephone Encounter (Signed)
Called saying that she has been having abdominal pain for a couple of weeks. She recently just had a UTI in the past 2 weeks so she isnt sure whats acutally causing the pain but would like to come in to be seen. She was last seen by JD w/  AAA & ABI studies. Please advise.

## 2020-02-02 ENCOUNTER — Other Ambulatory Visit: Payer: Self-pay

## 2020-02-02 ENCOUNTER — Ambulatory Visit (INDEPENDENT_AMBULATORY_CARE_PROVIDER_SITE_OTHER): Payer: Medicare HMO

## 2020-02-02 ENCOUNTER — Encounter (INDEPENDENT_AMBULATORY_CARE_PROVIDER_SITE_OTHER): Payer: Self-pay | Admitting: Nurse Practitioner

## 2020-02-02 ENCOUNTER — Ambulatory Visit (INDEPENDENT_AMBULATORY_CARE_PROVIDER_SITE_OTHER): Payer: Medicare HMO | Admitting: Nurse Practitioner

## 2020-02-02 ENCOUNTER — Telehealth: Payer: Self-pay

## 2020-02-02 VITALS — BP 133/75 | HR 64 | Resp 16 | Wt 150.4 lb

## 2020-02-02 DIAGNOSIS — R109 Unspecified abdominal pain: Secondary | ICD-10-CM | POA: Diagnosis not present

## 2020-02-02 DIAGNOSIS — I714 Abdominal aortic aneurysm, without rupture, unspecified: Secondary | ICD-10-CM

## 2020-02-02 DIAGNOSIS — F17200 Nicotine dependence, unspecified, uncomplicated: Secondary | ICD-10-CM

## 2020-02-02 DIAGNOSIS — R1013 Epigastric pain: Secondary | ICD-10-CM

## 2020-02-02 NOTE — Telephone Encounter (Signed)
Lmom to confirm and screen for 02-04-20 ov.

## 2020-02-03 ENCOUNTER — Encounter (INDEPENDENT_AMBULATORY_CARE_PROVIDER_SITE_OTHER): Payer: Self-pay | Admitting: Nurse Practitioner

## 2020-02-03 NOTE — Progress Notes (Signed)
Subjective:    Patient ID: Joy Carpenter, female    DOB: [birth year], 69 y.o.   MRN: [id number] Chief Complaint  Patient presents with  . Follow-up    ultrasound follow up    Patient presents today for concerns about abdominal pain.  The patient states that she recently had a UTI and it was thought that the discomfort could be resulting from that.  The patient states that the pain is not quite a "pain" but more of a pressure feeling.  She describes it as a midline epigastric sensation of fullness.  The patient states that she feels as if there is a knot in there is a stretching.  The patient also does note that she is recently gained about 20 to 30 pounds.  She denies any food phobia.  She denies any nausea or vomiting.  She denies any signs and symptoms of acute blood loss such as syncope, fatigue or abdominal pain that radiates to her back.  She denies any signs of peripheral embolization.  No signs infection such as fever, chills.  Today the patient underwent a mesenteric duplex which revealed a normal celiac artery, superior mesenteric and hepatic artery findings.  There are some mildly elevated velocities within the proximal splenic artery however no significant vessel narrowing.   Review of Systems  Gastrointestinal: Positive for abdominal pain.  All other systems reviewed and are negative.      Objective:   Physical Exam Vitals reviewed.  Cardiovascular:     Rate and Rhythm: Normal rate and regular rhythm.     Pulses: Normal pulses.     Heart sounds: Normal heart sounds.  Pulmonary:     Effort: Pulmonary effort is normal.     Breath sounds: Normal breath sounds.  Abdominal:     Palpations: There is no mass.     Tenderness: There is no abdominal tenderness. There is no guarding.  Skin:    General: Skin is warm and dry.  Neurological:     Mental Status: She is alert and oriented to person, place, and time.  Psychiatric:        Mood and Affect: Mood normal.      Behavior: Behavior normal.        Thought Content: Thought content normal.        Judgment: Judgment normal.     BP 133/75 (BP Location: Right Arm)   Pulse 64   Resp 16   Wt 150 lb 6.4 oz (68.2 kg)   BMI 24.28 kg/m   Past Medical History:  Diagnosis Date  . Anxiety   . Aortic aneurysm ([place])   . Carpal tunnel syndrome 2019  . Depression   . Emphysema of lung ([place])    "mild"  . GERD (gastroesophageal reflux disease)   . Headache    daily - stress  . Herpes   . Hyperlipidemia   . Insomnia   . Osteoporosis   . Vertigo    1 episode - approx 2010   . Weakness of both legs    intermittent    Social History   Socioeconomic History  . Marital status: Divorced    Spouse name: Not on file  . Number of children: Not on file  . Years of education: Not on file  . Highest education level: Not on file  Occupational History  . Not on file  Tobacco Use  . Smoking status: Current Every Day Smoker    Packs/day: 1.00    Years: 30.00  Pack years: 30.00    Types: Cigarettes  . Smokeless tobacco: Never Used  Substance and Sexual Activity  . Alcohol use: No  . Drug use: Never  . Sexual activity: Not on file  Other Topics Concern  . Not on file  Social History Narrative  . Not on file   Social Determinants of Health   Financial Resource Strain:   . Difficulty of Paying Living Expenses:   Food Insecurity:   . Worried About Charity fundraiser in the Last Year:   . Arboriculturist in the Last Year:   Transportation Needs:   . Film/video editor (Medical):   Marland Kitchen Lack of Transportation (Non-Medical):   Physical Activity:   . Days of Exercise per Week:   . Minutes of Exercise per Session:   Stress:   . Feeling of Stress :   Social Connections:   . Frequency of Communication with Friends and Family:   . Frequency of Social Gatherings with Friends and Family:   . Attends Religious Services:   . Active Member of Clubs or Organizations:   . Attends Theatre manager Meetings:   Marland Kitchen Marital Status:   Intimate Partner Violence:   . Fear of Current or Ex-Partner:   . Emotionally Abused:   Marland Kitchen Physically Abused:   . Sexually Abused:     Past Surgical History:  Procedure Laterality Date  . COLONOSCOPY    . COLONOSCOPY WITH PROPOFOL N/A 06/14/2016   Procedure: COLONOSCOPY WITH PROPOFOL;  Surgeon: Lucilla Lame, MD;  Location: [facility];  Service: Endoscopy;  Laterality: N/A;  . ESOPHAGOGASTRODUODENOSCOPY (EGD) WITH PROPOFOL N/A 06/14/2016   Procedure: ESOPHAGOGASTRODUODENOSCOPY (EGD) WITH PROPOFOL;  Surgeon: Lucilla Lame, MD;  Location: [facility];  Service: Endoscopy;  Laterality: N/A;  . TUBAL LIGATION      Family History  Problem Relation Age of Onset  . Leukemia Mother   . Aneurysm Other     No Known Allergies     Assessment & Plan:   1. AAA (abdominal aortic aneurysm) without rupture (HCC) Patient's most recent aortic duplex was in January and her aorta was approximately 4 cm.  It is not impossible that her aneurysm has shown some growth causing her discomfort.  It is also possible that there could be some inflammation of her aorta which is also causing some of the discomfort as well.  Out of an abundance of caution we will do a CT angiogram to get better visualization of her aorta to ensure that there is no rapid growth in the no surgical intervention is necessary at this time. - CT Angio Abd/Pel w/ and/or w/o; Future - Creatinine, IStat  2. Tobacco use disorder Smoking cessation was discussed, 3-10 minutes spent on this topic specifically   3. Abdominal pain, epigastric Based on some of the patient's description this could also be due to an eventration of the patient's abdominal muscles.  This could be possibly caused by the recent weight gain and distention of the abdominal fascia.   Current Outpatient Medications on File Prior to Visit  Medication Sig Dispense Refill  . ALPRAZolam (XANAX) 0.25 MG tablet Take 1  tablet (0.25 mg total) by mouth daily as needed for anxiety. for anxiety 30 tablet 3  . ciprofloxacin (CIPRO) 500 MG tablet Take 1 tablet (500 mg total) by mouth 2 (two) times daily. 14 tablet 0  . levothyroxine (SYNTHROID) 50 MCG tablet TAKE 1 TABLET BY MOUTH EVERY MORNING 30 MINUTES PRIOR TO  EATING FOOD 90 tablet 0  . meloxicam (MOBIC) 15 MG tablet Take 1/2 tab po daily 30 tablet 3  . pantoprazole (PROTONIX) 40 MG tablet Take 1 tablet (40 mg total) by mouth daily. 90 tablet 1  . simvastatin (ZOCOR) 20 MG tablet Take 1 tablet (20 mg total) by mouth daily. 90 tablet 3  . traMADol-acetaminophen (ULTRACET) 37.5-325 MG tablet Take 1 tablet by mouth 2 (two) times daily as needed. 60 tablet 1  . triamcinolone (KENALOG) 0.025 % cream Apply 1 application topically 2 (two) times daily. 80 g 2  . valACYclovir (VALTREX) 500 MG tablet Take 1 tablet (500 mg total) by mouth 2 (two) times daily. 60 tablet 2  . zolpidem (AMBIEN) 10 MG tablet Take 1 tablet (10 mg total) by mouth at bedtime. 30 tablet 3   No current facility-administered medications on file prior to visit.    There are no Patient Instructions on file for this visit. No follow-ups on file.   Kris Hartmann, NP

## 2020-02-04 ENCOUNTER — Other Ambulatory Visit: Payer: Self-pay

## 2020-02-04 ENCOUNTER — Encounter: Payer: Self-pay | Admitting: Nurse Practitioner

## 2020-02-04 ENCOUNTER — Ambulatory Visit (INDEPENDENT_AMBULATORY_CARE_PROVIDER_SITE_OTHER): Payer: Medicare HMO | Admitting: Nurse Practitioner

## 2020-02-04 VITALS — BP 123/72 | HR 62 | Temp 97.3°F | Resp 16 | Ht 66.0 in | Wt 151.2 lb

## 2020-02-04 DIAGNOSIS — N390 Urinary tract infection, site not specified: Secondary | ICD-10-CM | POA: Diagnosis not present

## 2020-02-04 DIAGNOSIS — R109 Unspecified abdominal pain: Secondary | ICD-10-CM | POA: Diagnosis not present

## 2020-02-04 DIAGNOSIS — R319 Hematuria, unspecified: Secondary | ICD-10-CM

## 2020-02-04 DIAGNOSIS — F411 Generalized anxiety disorder: Secondary | ICD-10-CM | POA: Diagnosis not present

## 2020-02-04 DIAGNOSIS — E039 Hypothyroidism, unspecified: Secondary | ICD-10-CM | POA: Diagnosis not present

## 2020-02-04 DIAGNOSIS — F5101 Primary insomnia: Secondary | ICD-10-CM | POA: Diagnosis not present

## 2020-02-04 LAB — POCT URINALYSIS DIPSTICK
Bilirubin, UA: NEGATIVE
Glucose, UA: NEGATIVE
Ketones, UA: NEGATIVE
Nitrite, UA: NEGATIVE
Protein, UA: NEGATIVE
Spec Grav, UA: 1.01 (ref 1.010–1.025)
Urobilinogen, UA: 0.2 E.U./dL
pH, UA: 5 (ref 5.0–8.0)

## 2020-02-04 MED ORDER — LEVOTHYROXINE SODIUM 50 MCG PO TABS: ORAL_TABLET | ORAL | 1 refills | Status: DC

## 2020-02-04 MED ORDER — ZOLPIDEM TARTRATE 10 MG PO TABS: 10.0000 mg | ORAL_TABLET | Freq: Every day | ORAL | 3 refills | Status: DC

## 2020-02-04 MED ORDER — CIPROFLOXACIN HCL 500 MG PO TABS: 500.0000 mg | ORAL_TABLET | Freq: Two times a day (BID) | ORAL | 0 refills | Status: DC

## 2020-02-04 MED ORDER — ALPRAZOLAM 0.25 MG PO TABS: 0.2500 mg | ORAL_TABLET | Freq: Every day | ORAL | 3 refills | Status: DC | PRN

## 2020-02-04 NOTE — Progress Notes (Signed)
[facility] [place],  [address]  Internal MEDICINE  Office Visit Note  Patient Name: Joy Carpenter  [id number]  [id number]  Date of Service: 02/17/2020  No chief complaint on file.   The patient is here for routine follow up visit. She continues to have some intermittent abdominal discomfort. This is along the center part of the abdomen and into epigastric part of the belly. She does have a thoracic aortic aneurysm. She was evaluated on Tuesday. And no changes were seen regarding the aneurysm. She will be set up for CT abdomen for further evaluation. She has had this study in the past. Last one did show stable pulmonary nodules. If these are not illustrated on CT, will get dedicated CT chest for further evaluation and surveillance.  The patient was treated for UTI at her last visit. Bacteria was normal flora. She did complete cipro BID for 7 days. She states that discomfort is much better, but is present intermittently.       Current Medication: Outpatient Encounter Medications as of 02/04/2020  Medication Sig  . ALPRAZolam (XANAX) 0.25 MG tablet Take 1 tablet (0.25 mg total) by mouth daily as needed for anxiety. for anxiety  . levothyroxine (SYNTHROID) 50 MCG tablet TAKE 1 TABLET BY MOUTH EVERY MORNING 30 MINUTES PRIOR TO EATING FOOD  . meloxicam (MOBIC) 15 MG tablet Take 1/2 tab po daily  . pantoprazole (PROTONIX) 40 MG tablet Take 1 tablet (40 mg total) by mouth daily.  . simvastatin (ZOCOR) 20 MG tablet Take 1 tablet (20 mg total) by mouth daily.  . traMADol-acetaminophen (ULTRACET) 37.5-325 MG tablet Take 1 tablet by mouth 2 (two) times daily as needed.  . triamcinolone (KENALOG) 0.025 % cream Apply 1 application topically 2 (two) times daily.  . valACYclovir (VALTREX) 500 MG tablet Take 1 tablet (500 mg total) by mouth 2 (two) times daily.  Marland Kitchen zolpidem (AMBIEN) 10 MG tablet Take 1 tablet (10 mg total) by mouth at bedtime.  . [DISCONTINUED]  ALPRAZolam (XANAX) 0.25 MG tablet Take 1 tablet (0.25 mg total) by mouth daily as needed for anxiety. for anxiety  . [DISCONTINUED] ciprofloxacin (CIPRO) 500 MG tablet Take 1 tablet (500 mg total) by mouth 2 (two) times daily.  . [DISCONTINUED] levothyroxine (SYNTHROID) 50 MCG tablet TAKE 1 TABLET BY MOUTH EVERY MORNING 30 MINUTES PRIOR TO EATING FOOD  . [DISCONTINUED] zolpidem (AMBIEN) 10 MG tablet Take 1 tablet (10 mg total) by mouth at bedtime.  . ciprofloxacin (CIPRO) 500 MG tablet Take 1 tablet (500 mg total) by mouth 2 (two) times daily.   No facility-administered encounter medications on file as of 02/04/2020.    Surgical History: Past Surgical History:  Procedure Laterality Date  . COLONOSCOPY    . COLONOSCOPY WITH PROPOFOL N/A 06/14/2016   Procedure: COLONOSCOPY WITH PROPOFOL;  Surgeon: Lucilla Lame, MD;  Location: [place];  Service: Endoscopy;  Laterality: N/A;  . ESOPHAGOGASTRODUODENOSCOPY (EGD) WITH PROPOFOL N/A 06/14/2016   Procedure: ESOPHAGOGASTRODUODENOSCOPY (EGD) WITH PROPOFOL;  Surgeon: Lucilla Lame, MD;  Location: [place];  Service: Endoscopy;  Laterality: N/A;  . TUBAL LIGATION      Medical History: Past Medical History:  Diagnosis Date  . Anxiety   . Aortic aneurysm ([place])   . Carpal tunnel syndrome 2019  . Depression   . Emphysema of lung ([place])    "mild"  . GERD (gastroesophageal reflux disease)   . Headache    daily - stress  . Herpes   .  Hyperlipidemia   . Insomnia   . Osteoporosis   . Vertigo    1 episode - approx 2010   . Weakness of both legs    intermittent    Family History: Family History  Problem Relation Age of Onset  . Leukemia Mother   . Aneurysm Other     Social History   Socioeconomic History  . Marital status: Divorced    Spouse name: Not on file  . Number of children: Not on file  . Years of education: Not on file  . Highest education level: Not on file  Occupational History  . Not on file  Tobacco Use   . Smoking status: Current Every Day Smoker    Packs/day: 1.00    Years: 30.00    Pack years: 30.00    Types: Cigarettes  . Smokeless tobacco: Never Used  Substance and Sexual Activity  . Alcohol use: No  . Drug use: Never  . Sexual activity: Not on file  Other Topics Concern  . Not on file  Social History Narrative  . Not on file   Social Determinants of Health   Financial Resource Strain:   . Difficulty of Paying Living Expenses:   Food Insecurity:   . Worried About Charity fundraiser in the Last Year:   . Arboriculturist in the Last Year:   Transportation Needs:   . Film/video editor (Medical):   Marland Kitchen Lack of Transportation (Non-Medical):   Physical Activity:   . Days of Exercise per Week:   . Minutes of Exercise per Session:   Stress:   . Feeling of Stress :   Social Connections:   . Frequency of Communication with Friends and Family:   . Frequency of Social Gatherings with Friends and Family:   . Attends Religious Services:   . Active Member of Clubs or Organizations:   . Attends Archivist Meetings:   Marland Kitchen Marital Status:   Intimate Partner Violence:   . Fear of Current or Ex-Partner:   . Emotionally Abused:   Marland Kitchen Physically Abused:   . Sexually Abused:       Review of Systems  Constitutional: Negative for activity change, chills, fatigue and unexpected weight change.  HENT: Negative for congestion, postnasal drip, rhinorrhea, sneezing and sore throat.   Respiratory: Negative for cough, chest tightness, shortness of breath and wheezing.   Cardiovascular: Negative for chest pain and palpitations.  Gastrointestinal: Positive for abdominal pain and constipation. Negative for diarrhea, nausea and vomiting.  Endocrine: Negative for cold intolerance, heat intolerance, polydipsia and polyuria.  Genitourinary: Positive for dysuria and flank pain. Negative for frequency.  Musculoskeletal: Negative for arthralgias, back pain, joint swelling and neck pain.   Skin: Negative for rash.  Allergic/Immunologic: Negative for environmental allergies.  Neurological: Negative for dizziness, tremors, numbness and headaches.  Hematological: Negative for adenopathy. Does not bruise/bleed easily.  Psychiatric/Behavioral: Positive for sleep disturbance. Negative for behavioral problems (Depression) and suicidal ideas. The patient is nervous/anxious.     Today's Vitals   02/04/20 1056  BP: 123/72  Pulse: 62  Resp: 16  Temp: (!) 97.3 F (36.3 C)  SpO2: 99%  Weight: 151 lb 3.2 oz (68.6 kg)  Height: 5\' 6"  (1.676 m)   Body mass index is 24.4 kg/m.  Physical Exam Vitals and nursing note reviewed.  Constitutional:      General: She is not in acute distress.    Appearance: Normal appearance. She is well-developed. She is not  diaphoretic.  HENT:     Head: Normocephalic and atraumatic.     Nose: Nose normal.     Mouth/Throat:     Pharynx: No oropharyngeal exudate.  Eyes:     Pupils: Pupils are equal, round, and reactive to light.  Neck:     Thyroid: No thyromegaly.     Vascular: No JVD.     Trachea: No tracheal deviation.  Cardiovascular:     Rate and Rhythm: Normal rate and regular rhythm.     Heart sounds: Normal heart sounds. No murmur. No friction rub. No gallop.   Pulmonary:     Effort: Pulmonary effort is normal. No respiratory distress.     Breath sounds: Normal breath sounds. No wheezing or rales.  Chest:     Chest wall: No tenderness.  Abdominal:     General: Bowel sounds are normal.     Palpations: Abdomen is soft.     Tenderness: There is abdominal tenderness.  Genitourinary:    Comments: Urine sample is positive for moderate blood and moderate WBC Musculoskeletal:        General: Normal range of motion.     Cervical back: Normal range of motion and neck supple.  Lymphadenopathy:     Cervical: No cervical adenopathy.  Skin:    General: Skin is warm and dry.  Neurological:     Mental Status: She is alert and oriented to  person, place, and time.     Cranial Nerves: No cranial nerve deficit.  Psychiatric:        Behavior: Behavior normal.        Thought Content: Thought content normal.        Judgment: Judgment normal.    Assessment/Plan: 1. Urinary tract infection with hematuria, site unspecified Start cipro 500mg  twice daily for 10 days. Send urine for culture and sensitivity and adjust antibiotics as indicated.  - POCT Urinalysis Dipstick - ciprofloxacin (CIPRO) 500 MG tablet; Take 1 tablet (500 mg total) by mouth 2 (two) times daily.  Dispense: 20 tablet; Refill: 0 - CULTURE, URINE COMPREHENSIVE  2. Abdominal pain, unspecified abdominal location Reviewed recent CT scan and ultrasound results. No explanation for symptoms. Will continue to monitor.   3. Generalized anxiety disorder May take alprazolam 0.25mg  daily as needed for acute anxiety. New prescription sent to her pharmacy.  - ALPRAZolam (XANAX) 0.25 MG tablet; Take 1 tablet (0.25 mg total) by mouth daily as needed for anxiety. for anxiety  Dispense: 30 tablet; Refill: 3  4. Primary insomnia May take ambien 10mg  at bedtime as needed for insomnia. Refill provided today.  - zolpidem (AMBIEN) 10 MG tablet; Take 1 tablet (10 mg total) by mouth at bedtime.  Dispense: 30 tablet; Refill: 3  5. Acquired hypothyroidism Stable. Continue levothyroxine as prescribed  - levothyroxine (SYNTHROID) 50 MCG tablet; TAKE 1 TABLET BY MOUTH EVERY MORNING 30 MINUTES PRIOR TO EATING FOOD  Dispense: 90 tablet; Refill: 1  General Counseling: reneisha knibbs understanding of the findings of todays visit and agrees with plan of treatment. I have discussed any further diagnostic evaluation that may be needed or ordered today. We also reviewed her medications today. she has been encouraged to call the office with any questions or concerns that should arise related to todays visit.  This patient was seen by Leretha Pol FNP Collaboration with Dr Lavera Guise as a part  of collaborative care agreement  Orders Placed This Encounter  Procedures  . CULTURE, URINE COMPREHENSIVE  . POCT Urinalysis  Dipstick    Meds ordered this encounter  Medications  . ciprofloxacin (CIPRO) 500 MG tablet    Sig: Take 1 tablet (500 mg total) by mouth 2 (two) times daily.    Dispense:  20 tablet    Refill:  0    Order Specific Question:   Supervising Provider    Answer:   Lavera Guise [id number]  . ALPRAZolam (XANAX) 0.25 MG tablet    Sig: Take 1 tablet (0.25 mg total) by mouth daily as needed for anxiety. for anxiety    Dispense:  30 tablet    Refill:  3    Order Specific Question:   Supervising Provider    Answer:   Lavera Guise [id number]  . levothyroxine (SYNTHROID) 50 MCG tablet    Sig: TAKE 1 TABLET BY MOUTH EVERY MORNING 30 MINUTES PRIOR TO EATING FOOD    Dispense:  90 tablet    Refill:  1    Order Specific Question:   Supervising Provider    Answer:   Lavera Guise [place]  . zolpidem (AMBIEN) 10 MG tablet    Sig: Take 1 tablet (10 mg total) by mouth at bedtime.    Dispense:  30 tablet    Refill:  3    Order Specific Question:   Supervising Provider    Answer:   Lavera Guise [id number]    Total time spent: 30 Minutes   Time spent includes review of chart, medications, test results, and follow up plan with the patient.      Dr Lavera Guise Internal medicine

## 2020-02-07 LAB — CULTURE, URINE COMPREHENSIVE

## 2020-03-02 ENCOUNTER — Other Ambulatory Visit: Payer: Self-pay

## 2020-03-02 ENCOUNTER — Ambulatory Visit
Admission: RE | Admit: 2020-03-02 | Discharge: 2020-03-02 | Disposition: A | Discharge status: Home / Self Care | Payer: Medicare HMO | Source: Ambulatory Visit | Attending: Nurse Practitioner | Admitting: Nurse Practitioner

## 2020-03-02 DIAGNOSIS — I714 Abdominal aortic aneurysm, without rupture, unspecified: Secondary | ICD-10-CM

## 2020-03-02 LAB — POCT I-STAT CREATININE: Creatinine, Ser: 1 mg/dL (ref 0.44–1.00)

## 2020-03-02 MED ORDER — IOHEXOL 350 MG/ML SOLN
100.0000 mL | Freq: Once | INTRAVENOUS | Status: AC | PRN
  Administered 2020-03-02: 100 mL via INTRAVENOUS

## 2020-04-19 ENCOUNTER — Ambulatory Visit (INDEPENDENT_AMBULATORY_CARE_PROVIDER_SITE_OTHER): Payer: Medicare HMO

## 2020-04-19 ENCOUNTER — Ambulatory Visit (INDEPENDENT_AMBULATORY_CARE_PROVIDER_SITE_OTHER): Payer: Medicare HMO | Admitting: Vascular Surgery

## 2020-04-19 ENCOUNTER — Encounter (INDEPENDENT_AMBULATORY_CARE_PROVIDER_SITE_OTHER): Payer: Self-pay | Admitting: Vascular Surgery

## 2020-04-19 ENCOUNTER — Other Ambulatory Visit: Payer: Self-pay

## 2020-04-19 VITALS — BP 119/76 | HR 68 | Resp 16 | Wt 150.6 lb

## 2020-04-19 DIAGNOSIS — F17200 Nicotine dependence, unspecified, uncomplicated: Secondary | ICD-10-CM | POA: Diagnosis not present

## 2020-04-19 DIAGNOSIS — R1013 Epigastric pain: Secondary | ICD-10-CM | POA: Diagnosis not present

## 2020-04-19 DIAGNOSIS — I716 Thoracoabdominal aortic aneurysm, without rupture, unspecified: Secondary | ICD-10-CM | POA: Insufficient documentation

## 2020-04-19 DIAGNOSIS — E785 Hyperlipidemia, unspecified: Secondary | ICD-10-CM | POA: Diagnosis not present

## 2020-04-19 DIAGNOSIS — I714 Abdominal aortic aneurysm, without rupture, unspecified: Secondary | ICD-10-CM

## 2020-04-19 DIAGNOSIS — I70213 Atherosclerosis of native arteries of extremities with intermittent claudication, bilateral legs: Secondary | ICD-10-CM | POA: Diagnosis not present

## 2020-04-19 NOTE — Assessment & Plan Note (Signed)
She has undergone a CT scan of the abdomen pelvis which I have reviewed from about 6 weeks ago.  This demonstrates a thoracoabdominal aneurysm with a maximal diameter of 5.3 cm in the distal thoracic aorta.  The abdominal portion of the aneurysm was officially read as 4.4 cm but to my review is closer to 4.7 to 4.8 cm in maximal diameter.  The iliac arteries are relatively normal in caliber with mild to moderate atherosclerotic disease.  There is clear involvement of the renal and visceral vessels with the aorta aneurysmal and measuring approximately 35 mm in size around their levels. At this point, with some enlargement of her aneurysm as well as potential abdominal pain related to it, I think referral to Dr. Vinnie Level at [facility] for consideration for a fenestrated repair of her thoracoabdominal aneurysm would be reasonable.  I discussed that consideration for an open repair would be a highly morbid procedure and with his current abilities for minimally invasive repair, that has lowered the morbidity and mortality.  We will provide this referral for her.  We will be here to help in any way possible going forward if needed.

## 2020-04-19 NOTE — Progress Notes (Signed)
MRN : [id number]  Joy Carpenter is a 69 y.o. ([birth year]) female who presents with chief complaint of  Chief Complaint  Patient presents with   Follow-up    ultrasound follow up  .  History of Present Illness: Patient returns today in follow up of her abdominal aortic aneurysm.  She continues to have abdominal pain.  No signs of peripheral embolization.  Her duplex today shows a 5.0 cm abdominal aortic aneurysm.  She has undergone a CT scan of the abdomen pelvis which I have reviewed from about 6 weeks ago.  This demonstrates a thoracoabdominal aneurysm with a maximal diameter of 5.3 cm in the distal thoracic aorta.  The abdominal portion of the aneurysm was officially read as 4.4 cm but to my review is closer to 4.7 to 4.8 cm in maximal diameter.  The iliac arteries are relatively normal in caliber with mild to moderate atherosclerotic disease.  There is clear involvement of the renal and visceral vessels with the aorta aneurysmal and measuring approximately 35 mm in size around their levels.  Current Outpatient Medications  Medication Sig Dispense Refill   ALPRAZolam (XANAX) 0.25 MG tablet Take 1 tablet (0.25 mg total) by mouth daily as needed for anxiety. for anxiety 30 tablet 3   levothyroxine (SYNTHROID) 50 MCG tablet TAKE 1 TABLET BY MOUTH EVERY MORNING 30 MINUTES PRIOR TO EATING FOOD 90 tablet 1   meloxicam (MOBIC) 15 MG tablet Take 1/2 tab po daily (Patient taking differently: Take 1/2 tab po as needed) 30 tablet 3   pantoprazole (PROTONIX) 40 MG tablet Take 1 tablet (40 mg total) by mouth daily. 90 tablet 1   simvastatin (ZOCOR) 20 MG tablet Take 1 tablet (20 mg total) by mouth daily. 90 tablet 3   traMADol-acetaminophen (ULTRACET) 37.5-325 MG tablet Take 1 tablet by mouth 2 (two) times daily as needed. 60 tablet 1   triamcinolone (KENALOG) 0.025 % cream Apply 1 application topically 2 (two) times daily. 80 g 2   valACYclovir (VALTREX) 500 MG tablet Take 1 tablet (500 mg  total) by mouth 2 (two) times daily. 60 tablet 2   zolpidem (AMBIEN) 10 MG tablet Take 1 tablet (10 mg total) by mouth at bedtime. 30 tablet 3   ciprofloxacin (CIPRO) 500 MG tablet Take 1 tablet (500 mg total) by mouth 2 (two) times daily. (Patient not taking: Reported on 04/19/2020) 20 tablet 0   No current facility-administered medications for this visit.    Past Medical History:  Diagnosis Date   Anxiety    Aortic aneurysm ([place])    Carpal tunnel syndrome 2019   Depression    Emphysema of lung ([place])    "mild"   GERD (gastroesophageal reflux disease)    Headache    daily - stress   Herpes    Hyperlipidemia    Insomnia    Osteoporosis    Vertigo    1 episode - approx 2010    Weakness of both legs    intermittent    Past Surgical History:  Procedure Laterality Date   COLONOSCOPY     COLONOSCOPY WITH PROPOFOL N/A 06/14/2016   Procedure: COLONOSCOPY WITH PROPOFOL;  Surgeon: Lucilla Lame, MD;  Location: [place];  Service: Endoscopy;  Laterality: N/A;   ESOPHAGOGASTRODUODENOSCOPY (EGD) WITH PROPOFOL N/A 06/14/2016   Procedure: ESOPHAGOGASTRODUODENOSCOPY (EGD) WITH PROPOFOL;  Surgeon: Lucilla Lame, MD;  Location: [place];  Service: Endoscopy;  Laterality: N/A;   TUBAL LIGATION     Social History  Substance Use Topics   Smoking status: Current Every Day Smoker    Packs/day: 1.00    Years: 30.00    Types: Cigarettes   Smokeless tobacco: Never Used   Alcohol use No     Family History Half brother with an aneurysms No bleeding or clotting disorders No porphyria. No autoimmune diseases  No Known Allergies   REVIEW OF SYSTEMS(Negative unless checked)  Constitutional: [] ??Weight loss[] ??Fever[] ??Chills Cardiac:[] ??Chest pain[] ??Chest pressure[] ??Palpitations [] ??Shortness of breath when laying flat [] ??Shortness of breath at rest [] ??Shortness of breath with exertion. Vascular: [] ??Pain in legs with  walking[] ??Pain in legsat rest[] ??Pain in legs when laying flat [] ??Claudication [] ??Pain in feet when walking [] ??Pain in feet at rest [] ??Pain in feet when laying flat [] ??History of DVT [] ??Phlebitis [] ??Swelling in legs [] ??Varicose veins [] ??Non-healing ulcers Pulmonary: [] ??Uses home oxygen [] ??Productive cough[] ??Hemoptysis [] ??Wheeze [x] ??COPD [] ??Asthma Neurologic: [] ??Dizziness [] ??Blackouts [] ??Seizures [] ??History of stroke [] ??History of TIA[] ??Aphasia [] ??Temporary blindness[] ??Dysphagia [] ??Weaknessor numbness in arms [] ??Weakness or numbnessin legs Musculoskeletal: [x] ??Arthritis [] ??Joint swelling [] ??Joint pain [] ??Low back pain Hematologic:[] ??Easy bruising[] ??Easy bleeding [] ??Hypercoagulable state [] ??Anemic  Gastrointestinal:[] ??Blood in stool[] ??Vomiting blood[x] ??Gastroesophageal reflux/heartburn[x] ??Abdominal pain Genitourinary: [] ??Chronic kidney disease [] ??Difficulturination [] ??Frequenturination [] ??Burning with urination[] ??Hematuria Skin: [] ??Rashes [] ??Ulcers [] ??Wounds Psychological: [] ??History of anxiety[] ??History of major depression.     Physical Examination  BP 119/76 (BP Location: Right Arm)    Pulse 68    Resp 16    Wt 150 lb 9.6 oz (68.3 kg)    BMI 24.31 kg/m  Gen:  WD/WN, NAD. Appears younger than stated age. Head: [place]/AT, No temporalis wasting. Ear/Nose/Throat: Hearing grossly intact, nares w/o erythema or drainage Eyes: Conjunctiva clear. Sclera non-icteric Neck: Supple.  Trachea midline Pulmonary:  Good air movement, no use of accessory muscles.  Cardiac: RRR, no JVD Vascular:  Vessel Right Left  Radial Palpable Palpable                          PT Palpable Palpable  DP Palpable Palpable   Gastrointestinal: soft, non-tender/non-distended. Increased aortic impulse Musculoskeletal: M/S 5/5 throughout.  No deformity or atrophy. No  edema. Neurologic: Sensation grossly intact in extremities.  Symmetrical.  Speech is fluent.  Psychiatric: Judgment intact, Mood & affect appropriate for pt's clinical situation. Dermatologic: No rashes or ulcers noted.  No cellulitis or open wounds.       Labs Recent Results (from the past 2160 hour(s))  CULTURE, URINE COMPREHENSIVE     Status: None   Collection Time: 02/04/20 12:00 AM   Specimen: Urine   URINE  Result Value Ref Range   Urine Culture, Comprehensive Final report    Organism ID, Bacteria Comment     Comment: Mixed urogenital flora 6,000  Colonies/mL   POCT Urinalysis Dipstick     Status: Abnormal   Collection Time: 02/04/20 10:51 AM  Result Value Ref Range   Color, UA     Clarity, UA     Glucose, UA Negative Negative   Bilirubin, UA negative    Ketones, UA negative    Spec Grav, UA 1.010 1.010 - 1.025   Blood, UA moderate    pH, UA 5.0 5.0 - 8.0   Protein, UA Negative Negative   Urobilinogen, UA 0.2 0.2 or 1.0 E.U./dL   Nitrite, UA negative    Leukocytes, UA Moderate (2+) (A) Negative   Appearance     Odor    I-STAT creatinine     Status: None   Collection Time: 03/02/20  9:19 AM  Result Value Ref Range   Creatinine, Ser  1.00 0.44 - 1.00 mg/dL    Radiology No results found.  Assessment/Plan Hyperlipidemia lipid control important in reducing the progression of atherosclerotic disease. Continue statin therapy   Abdominal pain, epigastric The patient is a very large hiatal hernia as well as the thoracoabdominal aneurysm.  Both could be possible cause of her pain.  Repair of the hiatal hernia would certainly be complicated by the moderate to large size thoracoabdominal aneurysm.  Thoracoabdominal aortic aneurysm (TAAA) without rupture ([place]) She has undergone a CT scan of the abdomen pelvis which I have reviewed from about 6 weeks ago.  This demonstrates a thoracoabdominal aneurysm with a maximal diameter of 5.3 cm in the distal thoracic aorta.   The abdominal portion of the aneurysm was officially read as 4.4 cm but to my review is closer to 4.7 to 4.8 cm in maximal diameter.  The iliac arteries are relatively normal in caliber with mild to moderate atherosclerotic disease.  There is clear involvement of the renal and visceral vessels with the aorta aneurysmal and measuring approximately 35 mm in size around their levels. At this point, with some enlargement of her aneurysm as well as potential abdominal pain related to it, I think referral to [facility] Vinnie Level at [facility] for consideration for a fenestrated repair of her thoracoabdominal aneurysm would be reasonable.  I discussed that consideration for an open repair would be a highly morbid procedure and with his current abilities for minimally invasive repair, that has lowered the morbidity and mortality.  We will provide this referral for her.  We will be here to help in any way possible going forward if needed.    Leotis Pain, MD  04/19/2020 11:16 AM    This note was created with Dragon medical transcription system.  Any errors from dictation are purely unintentional

## 2020-04-20 ENCOUNTER — Other Ambulatory Visit: Payer: Self-pay

## 2020-04-20 DIAGNOSIS — Z124 Encounter for screening for malignant neoplasm of cervix: Secondary | ICD-10-CM

## 2020-04-20 DIAGNOSIS — K219 Gastro-esophageal reflux disease without esophagitis: Secondary | ICD-10-CM

## 2020-04-20 DIAGNOSIS — Z79899 Other long term (current) drug therapy: Secondary | ICD-10-CM

## 2020-04-20 DIAGNOSIS — F5101 Primary insomnia: Secondary | ICD-10-CM

## 2020-04-20 DIAGNOSIS — F411 Generalized anxiety disorder: Secondary | ICD-10-CM

## 2020-04-20 DIAGNOSIS — R5383 Other fatigue: Secondary | ICD-10-CM

## 2020-04-20 DIAGNOSIS — R109 Unspecified abdominal pain: Secondary | ICD-10-CM

## 2020-04-20 DIAGNOSIS — R300 Dysuria: Secondary | ICD-10-CM

## 2020-04-20 DIAGNOSIS — Z1239 Encounter for other screening for malignant neoplasm of breast: Secondary | ICD-10-CM

## 2020-04-20 DIAGNOSIS — E559 Vitamin D deficiency, unspecified: Secondary | ICD-10-CM

## 2020-04-20 DIAGNOSIS — Z0001 Encounter for general adult medical examination with abnormal findings: Secondary | ICD-10-CM

## 2020-04-20 MED ORDER — PANTOPRAZOLE SODIUM 40 MG PO TBEC: 40.0000 mg | DELAYED_RELEASE_TABLET | Freq: Every day | ORAL | 1 refills | Status: DC

## 2020-04-26 NOTE — Addendum Note (Signed)
Addended by: Algernon Huxley on: 04/26/2020 10:16 AM   Modules accepted: Orders

## 2020-05-05 ENCOUNTER — Telehealth: Payer: Self-pay

## 2020-05-05 ENCOUNTER — Ambulatory Visit: Payer: Medicare HMO | Admitting: Nurse Practitioner

## 2020-05-05 NOTE — Telephone Encounter (Signed)
Confirmed and screened for 05-09-20 ov. °

## 2020-05-09 ENCOUNTER — Encounter: Payer: Self-pay | Admitting: Nurse Practitioner

## 2020-05-09 ENCOUNTER — Other Ambulatory Visit: Payer: Self-pay

## 2020-05-09 ENCOUNTER — Ambulatory Visit (INDEPENDENT_AMBULATORY_CARE_PROVIDER_SITE_OTHER): Payer: Medicare HMO | Admitting: Hospice and Palliative Medicine

## 2020-05-09 DIAGNOSIS — R300 Dysuria: Secondary | ICD-10-CM

## 2020-05-09 DIAGNOSIS — I714 Abdominal aortic aneurysm, without rupture, unspecified: Secondary | ICD-10-CM

## 2020-05-09 DIAGNOSIS — K449 Diaphragmatic hernia without obstruction or gangrene: Secondary | ICD-10-CM | POA: Diagnosis not present

## 2020-05-09 DIAGNOSIS — Z0001 Encounter for general adult medical examination with abnormal findings: Secondary | ICD-10-CM

## 2020-05-09 DIAGNOSIS — M150 Primary generalized (osteo)arthritis: Secondary | ICD-10-CM

## 2020-05-09 MED ORDER — TRAMADOL-ACETAMINOPHEN 37.5-325 MG PO TABS: 1.0000 | ORAL_TABLET | Freq: Two times a day (BID) | ORAL | 0 refills | Status: DC | PRN

## 2020-05-09 NOTE — Progress Notes (Signed)
[facility] [place], [place] [address]  Internal MEDICINE  Office Visit Note  Patient Name: Joy Carpenter  [id number]  [id number]  Date of Service: 05/14/2020  Chief Complaint  Patient presents with  . Medicare Wellness  . Depression  . Gastroesophageal Reflux  . Hyperlipidemia     HPI Pt is here for routine health maintenance examination. She has been suffering from chronic abdominal pain. She has been followed by Dr. Lucky Cowboy, vascular, and recently found that her AAA has increased in size to 5.3 cm in diameter. Also found a large hiatal hernia that is pushing her stomach up and behind her ribcage. Both of this findings are consistent with her chronic abdominal pain. Dr. Lucky Cowboy has referred her to a specialist with [facility] for AAA and hernia repair. Additional scans and follow-up appointment scheduled later this month with specialist. With her new diagnoses and mention of possible surgery she has felt quite anxious lately but feels as though her xanax seems to be helping. Only taking it as needed. Requesting refill of Tramadol for abdominal pain for short course until resolution of abdominal issues. Forgot to have blood work done but will have done and will need done before any procedures Will schedule her mamogram once AAA settles down BP continues to be well controlled Once labs are completed will review for control of HLD and thyroid  Current Medication: Outpatient Encounter Medications as of 05/09/2020  Medication Sig  . ALPRAZolam (XANAX) 0.25 MG tablet Take 1 tablet (0.25 mg total) by mouth daily as needed for anxiety. for anxiety  . levothyroxine (SYNTHROID) 50 MCG tablet TAKE 1 TABLET BY MOUTH EVERY MORNING 30 MINUTES PRIOR TO EATING FOOD  . meloxicam (MOBIC) 15 MG tablet Take 1/2 tab po daily (Patient taking differently: Take 1/2 tab po as needed)  . pantoprazole (PROTONIX) 40 MG tablet Take 1 tablet (40 mg total) by mouth daily.  . simvastatin (ZOCOR) 20  MG tablet Take 1 tablet (20 mg total) by mouth daily.  . traMADol-acetaminophen (ULTRACET) 37.5-325 MG tablet Take 1 tablet by mouth 2 (two) times daily as needed.  . triamcinolone (KENALOG) 0.025 % cream Apply 1 application topically 2 (two) times daily.  . valACYclovir (VALTREX) 500 MG tablet Take 1 tablet (500 mg total) by mouth 2 (two) times daily.  Marland Kitchen zolpidem (AMBIEN) 10 MG tablet Take 1 tablet (10 mg total) by mouth at bedtime.  . [DISCONTINUED] traMADol-acetaminophen (ULTRACET) 37.5-325 MG tablet Take 1 tablet by mouth 2 (two) times daily as needed.  . [DISCONTINUED] ciprofloxacin (CIPRO) 500 MG tablet Take 1 tablet (500 mg total) by mouth 2 (two) times daily. (Patient not taking: Reported on 04/19/2020)   No facility-administered encounter medications on file as of 05/09/2020.    Surgical History: Past Surgical History:  Procedure Laterality Date  . COLONOSCOPY    . COLONOSCOPY WITH PROPOFOL N/A 06/14/2016   Procedure: COLONOSCOPY WITH PROPOFOL;  Surgeon: Lucilla Lame, MD;  Location: [place];  Service: Endoscopy;  Laterality: N/A;  . ESOPHAGOGASTRODUODENOSCOPY (EGD) WITH PROPOFOL N/A 06/14/2016   Procedure: ESOPHAGOGASTRODUODENOSCOPY (EGD) WITH PROPOFOL;  Surgeon: Lucilla Lame, MD;  Location: [place];  Service: Endoscopy;  Laterality: N/A;  . TUBAL LIGATION      Medical History: Past Medical History:  Diagnosis Date  . Anxiety   . Aortic aneurysm ([place])   . Carpal tunnel syndrome 2019  . Depression   . Emphysema of lung ([place])    "mild"  . GERD (gastroesophageal reflux  disease)   . Headache    daily - stress  . Herpes   . Hyperlipidemia   . Insomnia   . Osteoporosis   . Vertigo    1 episode - approx 2010   . Weakness of both legs    intermittent    Family History: Family History  Problem Relation Age of Onset  . Leukemia Mother   . Aneurysm Other     Review of Systems  Constitutional: Negative for chills, diaphoresis and fatigue.  HENT:  Negative for ear pain, postnasal drip and sinus pressure.   Eyes: Negative for photophobia, discharge, redness, itching and visual disturbance.  Respiratory: Negative for cough, shortness of breath and wheezing.   Cardiovascular: Negative for chest pain, palpitations and leg swelling.  Gastrointestinal: Positive for abdominal pain. Negative for constipation, diarrhea, nausea and vomiting.  Genitourinary: Negative for dysuria and flank pain.  Musculoskeletal: Negative for arthralgias, back pain, gait problem and neck pain.  Skin: Negative for color change.  Allergic/Immunologic: Negative for environmental allergies and food allergies.  Neurological: Negative for dizziness and headaches.  Hematological: Does not bruise/bleed easily.  Psychiatric/Behavioral: Negative for agitation, behavioral problems (depression) and hallucinations.     Vital Signs: BP 121/67   Pulse 75   Temp 97.6 F (36.4 C)   Resp 16   Ht 5\' 6"  (1.676 m)   Wt 152 lb 9.6 oz (69.2 kg)   SpO2 97%   BMI 24.63 kg/m    Physical Exam Constitutional:      General: She is not in acute distress.    Appearance: She is well-developed. She is not diaphoretic.  HENT:     Head: Normocephalic and atraumatic.     Mouth/Throat:     Pharynx: No oropharyngeal exudate.  Eyes:     Pupils: Pupils are equal, round, and reactive to light.  Neck:     Thyroid: No thyromegaly.     Vascular: No JVD.     Trachea: No tracheal deviation.  Cardiovascular:     Rate and Rhythm: Normal rate and regular rhythm.     Heart sounds: Normal heart sounds. No murmur heard.  No friction rub. No gallop.   Pulmonary:     Effort: Pulmonary effort is normal. No respiratory distress.     Breath sounds: No wheezing or rales.  Chest:     Chest wall: No tenderness.  Abdominal:     General: Bowel sounds are normal.     Palpations: Abdomen is soft.     Tenderness: There is abdominal tenderness.  Musculoskeletal:        General: Normal range of  motion.     Cervical back: Normal range of motion and neck supple.  Lymphadenopathy:     Cervical: No cervical adenopathy.  Skin:    General: Skin is warm and dry.  Neurological:     Mental Status: She is alert and oriented to person, place, and time.     Cranial Nerves: No cranial nerve deficit.  Psychiatric:        Behavior: Behavior normal.        Thought Content: Thought content normal.        Judgment: Judgment normal.    LABS: Recent Results (from the past 2160 hour(s))  I-STAT creatinine     Status: None   Collection Time: 03/02/20  9:19 AM  Result Value Ref Range   Creatinine, Ser 1.00 0.44 - 1.00 mg/dL  UA/M w/rflx Culture, Routine     Status: Abnormal  Collection Time: 05/09/20  3:33 PM   Specimen: Urine   Urine  Result Value Ref Range   Specific Gravity, UA 1.017 1.005 - 1.030   pH, UA 5.0 5.0 - 7.5   Color, UA Yellow Yellow   Appearance Ur Clear Clear   Leukocytes,UA Trace (A) Negative   Protein,UA Negative Negative/Trace   Glucose, UA Negative Negative   Ketones, UA Trace (A) Negative   RBC, UA Negative Negative   Bilirubin, UA Negative Negative   Urobilinogen, Ur 0.2 0.2 - 1.0 mg/dL   Nitrite, UA Negative Negative   Microscopic Examination See below:     Comment: Microscopic was indicated and was performed.   Urinalysis Reflex Comment     Comment: This specimen has reflexed to a Urine Culture.  Microscopic Examination     Status: Abnormal   Collection Time: 05/09/20  3:33 PM   Urine  Result Value Ref Range   WBC, UA 6-10 (A) 0 - 5 /hpf   RBC None seen 0 - 2 /hpf   Epithelial Cells (non renal) 0-10 0 - 10 /hpf   Casts None seen None seen /lpf   Bacteria, UA Few None seen/Few  Urine Culture, Reflex     Status: None   Collection Time: 05/09/20  3:33 PM   Urine  Result Value Ref Range   Urine Culture, Routine Final report    Organism ID, Bacteria No growth     Assessment/Plan: 1. Encounter for general adult medical examination with abnormal  findings Well appearing 69 year old female. Will have mammogram once she has AAA and hernia resolved, otherwise up to date on PHM.  2. Abdominal aortic aneurysm (AAA) without rupture ([place]) Presence of AAA as well as large hiatal hernia, consistent with abdominal pain. Short term pain medication until specialist plan of care for surgery. AAA now 5.3 cm in diameter and due to nature of AAA being seen by specialist at [facility]. - traMADol-acetaminophen (ULTRACET) 37.5-325 MG tablet; Take 1 tablet by mouth 2 (two) times daily as needed.  Dispense: 60 tablet; Refill: 0  3. Hernia, hiatal Short term pain medication due to pain from AAA as well as hernia. Awaiting plan or care from [facility] specialist for plan for surgery. - traMADol-acetaminophen (ULTRACET) 37.5-325 MG tablet; Take 1 tablet by mouth 2 (two) times daily as needed.  Dispense: 60 tablet; Refill: 0  4. Dysuria - UA/M w/rflx Culture, Routine   General Counseling: ivis nicolson understanding of the findings of todays visit and agrees with plan of treatment. I have discussed any further diagnostic evaluation that may be needed or ordered today. We also reviewed her medications today. she has been encouraged to call the office with any questions or concerns that should arise related to todays visit.   Orders Placed This Encounter  Procedures  . Microscopic Examination  . Urine Culture, Reflex  . UA/M w/rflx Culture, Routine    Meds ordered this encounter  Medications  . traMADol-acetaminophen (ULTRACET) 37.5-325 MG tablet    Sig: Take 1 tablet by mouth 2 (two) times daily as needed.    Dispense:  60 tablet    Refill:  0    Patient has good RX coupon to use for this prescription.    Total time spent: 30 Minutes  This patient was seen by Theodoro Grist, AGNP-C in collaboration with Dr. Lavera Guise as part of a collaborative care agreement.  Time spent includes review of chart, medications, test results, and follow up plan with  the  patient.   Tanna Furry Kenton Kingfisher, AGNP-C  Lavera Guise, MD Internal Medicine

## 2020-05-11 LAB — UA/M W/RFLX CULTURE, ROUTINE
Bilirubin, UA: NEGATIVE
Glucose, UA: NEGATIVE
Nitrite, UA: NEGATIVE
Protein,UA: NEGATIVE
RBC, UA: NEGATIVE
Specific Gravity, UA: 1.017 (ref 1.005–1.030)
Urobilinogen, Ur: 0.2 mg/dL (ref 0.2–1.0)
pH, UA: 5 (ref 5.0–7.5)

## 2020-05-11 LAB — MICROSCOPIC EXAMINATION
Casts: NONE SEEN /lpf
RBC, Urine: NONE SEEN /hpf (ref 0–2)

## 2020-05-11 LAB — URINE CULTURE, REFLEX: Organism ID, Bacteria: NO GROWTH

## 2020-05-12 DIAGNOSIS — J439 Emphysema, unspecified: Secondary | ICD-10-CM | POA: Diagnosis not present

## 2020-05-12 DIAGNOSIS — I714 Abdominal aortic aneurysm, without rupture: Secondary | ICD-10-CM | POA: Diagnosis not present

## 2020-05-12 DIAGNOSIS — K439 Ventral hernia without obstruction or gangrene: Secondary | ICD-10-CM | POA: Diagnosis not present

## 2020-05-12 DIAGNOSIS — E039 Hypothyroidism, unspecified: Secondary | ICD-10-CM | POA: Diagnosis not present

## 2020-05-12 DIAGNOSIS — Z79899 Other long term (current) drug therapy: Secondary | ICD-10-CM | POA: Diagnosis not present

## 2020-05-12 DIAGNOSIS — Z720 Tobacco use: Secondary | ICD-10-CM | POA: Diagnosis not present

## 2020-05-12 DIAGNOSIS — F17200 Nicotine dependence, unspecified, uncomplicated: Secondary | ICD-10-CM | POA: Diagnosis not present

## 2020-05-12 DIAGNOSIS — G5600 Carpal tunnel syndrome, unspecified upper limb: Secondary | ICD-10-CM | POA: Diagnosis not present

## 2020-05-12 DIAGNOSIS — Z79890 Hormone replacement therapy (postmenopausal): Secondary | ICD-10-CM | POA: Diagnosis not present

## 2020-05-12 DIAGNOSIS — I716 Thoracoabdominal aortic aneurysm, without rupture: Secondary | ICD-10-CM | POA: Diagnosis not present

## 2020-05-12 DIAGNOSIS — E785 Hyperlipidemia, unspecified: Secondary | ICD-10-CM | POA: Diagnosis not present

## 2020-05-18 DIAGNOSIS — I6523 Occlusion and stenosis of bilateral carotid arteries: Secondary | ICD-10-CM | POA: Diagnosis not present

## 2020-05-18 DIAGNOSIS — I712 Thoracic aortic aneurysm, without rupture: Secondary | ICD-10-CM | POA: Diagnosis not present

## 2020-05-18 DIAGNOSIS — E785 Hyperlipidemia, unspecified: Secondary | ICD-10-CM | POA: Diagnosis not present

## 2020-05-18 DIAGNOSIS — I716 Thoracoabdominal aortic aneurysm, without rupture: Secondary | ICD-10-CM | POA: Diagnosis not present

## 2020-05-18 DIAGNOSIS — Z01810 Encounter for preprocedural cardiovascular examination: Secondary | ICD-10-CM | POA: Diagnosis not present

## 2020-05-18 DIAGNOSIS — F17210 Nicotine dependence, cigarettes, uncomplicated: Secondary | ICD-10-CM | POA: Diagnosis not present

## 2020-05-18 DIAGNOSIS — Z01818 Encounter for other preprocedural examination: Secondary | ICD-10-CM | POA: Diagnosis not present

## 2020-05-26 ENCOUNTER — Other Ambulatory Visit: Payer: Self-pay | Admitting: Nurse Practitioner

## 2020-05-26 DIAGNOSIS — E559 Vitamin D deficiency, unspecified: Secondary | ICD-10-CM | POA: Diagnosis not present

## 2020-05-26 DIAGNOSIS — E782 Mixed hyperlipidemia: Secondary | ICD-10-CM | POA: Diagnosis not present

## 2020-05-26 DIAGNOSIS — Z0001 Encounter for general adult medical examination with abnormal findings: Secondary | ICD-10-CM | POA: Diagnosis not present

## 2020-05-27 ENCOUNTER — Telehealth: Payer: Self-pay

## 2020-05-27 LAB — CBC
Hematocrit: 45.1 % (ref 34.0–46.6)
Hemoglobin: 15.1 g/dL (ref 11.1–15.9)
MCH: 32.3 pg (ref 26.6–33.0)
MCHC: 33.5 g/dL (ref 31.5–35.7)
MCV: 97 fL (ref 79–97)
Platelets: 259 10*3/uL (ref 150–450)
RBC: 4.67 x10E6/uL (ref 3.77–5.28)
RDW: 13.1 % (ref 11.7–15.4)
WBC: 5.6 10*3/uL (ref 3.4–10.8)

## 2020-05-27 LAB — LIPID PANEL W/O CHOL/HDL RATIO
Cholesterol, Total: 200 mg/dL — ABNORMAL HIGH (ref 100–199)
HDL: 61 mg/dL (ref >39)
LDL Chol Calc (NIH): 130 mg/dL — ABNORMAL HIGH (ref 0–99)
Triglycerides: 48 mg/dL (ref 0–149)
VLDL Cholesterol Cal: 9 mg/dL (ref 5–40)

## 2020-05-27 LAB — COMPREHENSIVE METABOLIC PANEL
ALT: 10 IU/L (ref 0–32)
AST: 18 IU/L (ref 0–40)
Albumin/Globulin Ratio: 1.9 (ref 1.2–2.2)
Albumin: 4.6 g/dL (ref 3.8–4.8)
Alkaline Phosphatase: 146 IU/L — ABNORMAL HIGH (ref 48–121)
BUN/Creatinine Ratio: 20 (ref 12–28)
BUN: 26 mg/dL (ref 8–27)
Bilirubin Total: 0.3 mg/dL (ref 0.0–1.2)
CO2: 23 mmol/L (ref 20–29)
Calcium: 9.9 mg/dL (ref 8.7–10.3)
Chloride: 99 mmol/L (ref 96–106)
Creatinine, Ser: 1.33 mg/dL — ABNORMAL HIGH (ref 0.57–1.00)
GFR calc Af Amer: 48 mL/min/{1.73_m2} — ABNORMAL LOW (ref >59)
GFR calc non Af Amer: 41 mL/min/{1.73_m2} — ABNORMAL LOW (ref >59)
Globulin, Total: 2.4 g/dL (ref 1.5–4.5)
Glucose: 100 mg/dL — ABNORMAL HIGH (ref 65–99)
Potassium: 4.3 mmol/L (ref 3.5–5.2)
Sodium: 137 mmol/L (ref 134–144)
Total Protein: 7 g/dL (ref 6.0–8.5)

## 2020-05-27 LAB — TSH: TSH: 1.01 u[IU]/mL (ref 0.450–4.500)

## 2020-05-27 LAB — T4, FREE: Free T4: 1.13 ng/dL (ref 0.82–1.77)

## 2020-05-27 LAB — HEPATITIS C ANTIBODY: Hep C Virus Ab: <0.1 s/co ratio (ref 0.0–0.9)

## 2020-05-27 LAB — VITAMIN D 25 HYDROXY (VIT D DEFICIENCY, FRACTURES): Vit D, 25-Hydroxy: 39.8 ng/mL (ref 30.0–100.0)

## 2020-05-27 NOTE — Telephone Encounter (Signed)
Confirmed and screened for 05-31-20 ov.

## 2020-05-31 ENCOUNTER — Encounter: Payer: Self-pay | Admitting: Nurse Practitioner

## 2020-05-31 ENCOUNTER — Other Ambulatory Visit: Payer: Self-pay

## 2020-05-31 ENCOUNTER — Ambulatory Visit (INDEPENDENT_AMBULATORY_CARE_PROVIDER_SITE_OTHER): Payer: Medicare HMO | Admitting: Nurse Practitioner

## 2020-05-31 VITALS — BP 115/76 | HR 78 | Temp 97.6°F | Resp 16 | Ht 66.0 in | Wt 148.0 lb

## 2020-05-31 DIAGNOSIS — F411 Generalized anxiety disorder: Secondary | ICD-10-CM

## 2020-05-31 DIAGNOSIS — I714 Abdominal aortic aneurysm, without rupture, unspecified: Secondary | ICD-10-CM

## 2020-05-31 DIAGNOSIS — K449 Diaphragmatic hernia without obstruction or gangrene: Secondary | ICD-10-CM | POA: Diagnosis not present

## 2020-05-31 DIAGNOSIS — E039 Hypothyroidism, unspecified: Secondary | ICD-10-CM

## 2020-05-31 DIAGNOSIS — F5101 Primary insomnia: Secondary | ICD-10-CM | POA: Diagnosis not present

## 2020-05-31 MED ORDER — ZOLPIDEM TARTRATE 10 MG PO TABS: 10.0000 mg | ORAL_TABLET | Freq: Every day | ORAL | 3 refills | Status: DC

## 2020-05-31 MED ORDER — ALPRAZOLAM 0.25 MG PO TABS: 0.2500 mg | ORAL_TABLET | Freq: Every day | ORAL | 3 refills | Status: DC | PRN

## 2020-05-31 MED ORDER — TRAMADOL-ACETAMINOPHEN 37.5-325 MG PO TABS: 1.0000 | ORAL_TABLET | Freq: Two times a day (BID) | ORAL | 0 refills | Status: DC | PRN

## 2020-05-31 NOTE — Progress Notes (Signed)
[facility] [place], [place] [address]  Internal MEDICINE  Office Visit Note  Patient Name: Joy Carpenter  [id number]  [id number]  Date of Service: 06/15/2020  Chief Complaint  Patient presents with  . Follow-up    review scans  . Depression  . Gastroesophageal Reflux  . Hyperlipidemia    She has been suffering from chronic abdominal pain. She has been followed by Dr. Lucky Cowboy, vascular, and recently found that her AAA has increased in size to 5.3 cm in diameter. Also found a large hiatal hernia that is pushing her stomach up and behind her ribcage. Both of this findings are consistent with her chronic abdominal pain. She was referred to Dr. Sammuel Hines at [facility] for further evaluation, for surgical consultation of AAA repair and possible repair of the hernia. Dr. Sammuel Hines is planning to do an endovascular stent graft. This is investigational procedure which has yielded good results thus far. She has had multiple studies per [facility] vascular surgery. She will have to have stent made prior to procedure. This procedure cuts down on surgical time as well as post-op hospitalization stay. It will also take her less time to recover overall.       Current Medication: Outpatient Encounter Medications as of 05/31/2020  Medication Sig  . ALPRAZolam (XANAX) 0.25 MG tablet Take 1 tablet (0.25 mg total) by mouth daily as needed for anxiety. for anxiety  . levothyroxine (SYNTHROID) 50 MCG tablet TAKE 1 TABLET BY MOUTH EVERY MORNING 30 MINUTES PRIOR TO EATING FOOD  . meloxicam (MOBIC) 15 MG tablet Take 1/2 tab po daily (Patient taking differently: Take 1/2 tab po as needed)  . pantoprazole (PROTONIX) 40 MG tablet Take 1 tablet (40 mg total) by mouth daily.  . traMADol-acetaminophen (ULTRACET) 37.5-325 MG tablet Take 1 tablet by mouth 2 (two) times daily as needed.  . triamcinolone (KENALOG) 0.025 % cream Apply 1 application topically 2 (two) times daily.  . valACYclovir (VALTREX) 500 MG  tablet Take 1 tablet (500 mg total) by mouth 2 (two) times daily.  Marland Kitchen zolpidem (AMBIEN) 10 MG tablet Take 1 tablet (10 mg total) by mouth at bedtime.  . [DISCONTINUED] ALPRAZolam (XANAX) 0.25 MG tablet Take 1 tablet (0.25 mg total) by mouth daily as needed for anxiety. for anxiety  . [DISCONTINUED] simvastatin (ZOCOR) 20 MG tablet Take 1 tablet (20 mg total) by mouth daily.  . [DISCONTINUED] traMADol-acetaminophen (ULTRACET) 37.5-325 MG tablet Take 1 tablet by mouth 2 (two) times daily as needed.  . [DISCONTINUED] zolpidem (AMBIEN) 10 MG tablet Take 1 tablet (10 mg total) by mouth at bedtime.   No facility-administered encounter medications on file as of 05/31/2020.    Surgical History: Past Surgical History:  Procedure Laterality Date  . COLONOSCOPY    . COLONOSCOPY WITH PROPOFOL N/A 06/14/2016   Procedure: COLONOSCOPY WITH PROPOFOL;  Surgeon: Lucilla Lame, MD;  Location: [place];  Service: Endoscopy;  Laterality: N/A;  . ESOPHAGOGASTRODUODENOSCOPY (EGD) WITH PROPOFOL N/A 06/14/2016   Procedure: ESOPHAGOGASTRODUODENOSCOPY (EGD) WITH PROPOFOL;  Surgeon: Lucilla Lame, MD;  Location: [place];  Service: Endoscopy;  Laterality: N/A;  . TUBAL LIGATION      Medical History: Past Medical History:  Diagnosis Date  . Anxiety   . Aortic aneurysm ([place])   . Carpal tunnel syndrome 2019  . Depression   . Emphysema of lung ([place])    "mild"  . GERD (gastroesophageal reflux disease)   . Headache    daily - stress  .  Herpes   . Hyperlipidemia   . Insomnia   . Osteoporosis   . Vertigo    1 episode - approx 2010   . Weakness of both legs    intermittent    Family History: Family History  Problem Relation Age of Onset  . Leukemia Mother   . Aneurysm Other     Social History   Socioeconomic History  . Marital status: Divorced    Spouse name: Not on file  . Number of children: Not on file  . Years of education: Not on file  . Highest education level: Not on file   Occupational History  . Not on file  Tobacco Use  . Smoking status: Current Every Day Smoker    Packs/day: 1.00    Years: 30.00    Pack years: 30.00    Types: Cigarettes  . Smokeless tobacco: Never Used  Substance and Sexual Activity  . Alcohol use: No  . Drug use: Never  . Sexual activity: Not on file  Other Topics Concern  . Not on file  Social History Narrative  . Not on file   Social Determinants of Health   Financial Resource Strain:   . Difficulty of Paying Living Expenses: Not on file  Food Insecurity:   . Worried About Charity fundraiser in the Last Year: Not on file  . Ran Out of Food in the Last Year: Not on file  Transportation Needs:   . Lack of Transportation (Medical): Not on file  . Lack of Transportation (Non-Medical): Not on file  Physical Activity:   . Days of Exercise per Week: Not on file  . Minutes of Exercise per Session: Not on file  Stress:   . Feeling of Stress : Not on file  Social Connections:   . Frequency of Communication with Friends and Family: Not on file  . Frequency of Social Gatherings with Friends and Family: Not on file  . Attends Religious Services: Not on file  . Active Member of Clubs or Organizations: Not on file  . Attends Archivist Meetings: Not on file  . Marital Status: Not on file  Intimate Partner Violence:   . Fear of Current or Ex-Partner: Not on file  . Emotionally Abused: Not on file  . Physically Abused: Not on file  . Sexually Abused: Not on file      Review of Systems  Constitutional: Negative for activity change, chills, fatigue and unexpected weight change.  HENT: Negative for congestion, postnasal drip, rhinorrhea, sneezing and sore throat.   Respiratory: Negative for cough, chest tightness, shortness of breath and wheezing.   Cardiovascular: Negative for chest pain and palpitations.  Gastrointestinal: Positive for abdominal pain and constipation. Negative for diarrhea, nausea and vomiting.   Endocrine: Negative for cold intolerance, heat intolerance, polydipsia and polyuria.  Musculoskeletal: Negative for arthralgias, back pain, joint swelling and neck pain.  Skin: Negative for rash.  Allergic/Immunologic: Negative for environmental allergies.  Neurological: Negative for dizziness, tremors, numbness and headaches.  Hematological: Negative for adenopathy. Does not bruise/bleed easily.  Psychiatric/Behavioral: Positive for sleep disturbance. Negative for behavioral problems (Depression) and suicidal ideas. The patient is nervous/anxious.     Today's Vitals   05/31/20 1529  BP: 115/76  Pulse: 78  Resp: 16  Temp: 97.6 F (36.4 C)  SpO2: 98%  Weight: 148 lb (67.1 kg)  Height: 5\' 6"  (1.676 m)   Body mass index is 23.89 kg/m.  Physical Exam Vitals and nursing note reviewed.  Constitutional:      General: She is not in acute distress.    Appearance: Normal appearance. She is well-developed. She is not diaphoretic.  HENT:     Head: Normocephalic and atraumatic.     Nose: Nose normal.     Mouth/Throat:     Pharynx: No oropharyngeal exudate.  Eyes:     Pupils: Pupils are equal, round, and reactive to light.  Neck:     Thyroid: No thyromegaly.     Vascular: No JVD.     Trachea: No tracheal deviation.  Cardiovascular:     Rate and Rhythm: Normal rate and regular rhythm.     Heart sounds: Normal heart sounds. No murmur heard.  No friction rub. No gallop.   Pulmonary:     Effort: Pulmonary effort is normal. No respiratory distress.     Breath sounds: Normal breath sounds. No wheezing or rales.  Chest:     Chest wall: No tenderness.  Abdominal:     General: Bowel sounds are normal.     Palpations: Abdomen is soft.     Tenderness: There is abdominal tenderness.     Comments: Mild, generalized abdominal tenderness with palpation of the abdomen.  Musculoskeletal:        General: Normal range of motion.     Cervical back: Normal range of motion and neck supple.   Lymphadenopathy:     Cervical: No cervical adenopathy.  Skin:    General: Skin is warm and dry.  Neurological:     Mental Status: She is alert and oriented to person, place, and time.     Cranial Nerves: No cranial nerve deficit.  Psychiatric:        Mood and Affect: Mood normal.        Behavior: Behavior normal.        Thought Content: Thought content normal.        Judgment: Judgment normal.   Assessment/Plan: 1. Abdominal aortic aneurysm (AAA) without rupture ([place]) Patient will continue to see vascular surgery for repair/removal. Renewed tramadol 37.5/325mg  tablets up to twice daily to help control pain.  - traMADol-acetaminophen (ULTRACET) 37.5-325 MG tablet; Take 1 tablet by mouth 2 (two) times daily as needed.  Dispense: 60 tablet; Refill: 0  2. Hernia, hiatal Patient will continue to see surgery for repair/removal. Renewed tramadol 37.5/325mg  tablets up to twice daily to help control pain.  - traMADol-acetaminophen (ULTRACET) 37.5-325 MG tablet; Take 1 tablet by mouth 2 (two) times daily as needed.  Dispense: 60 tablet; Refill: 0  3. Generalized anxiety disorder May take alprazolam 0.25mg  daily if needed for acute anxiety. A new prescription was sent to her pharmacy today.  - ALPRAZolam (XANAX) 0.25 MG tablet; Take 1 tablet (0.25 mg total) by mouth daily as needed for anxiety. for anxiety  Dispense: 30 tablet; Refill: 3  4. Primary insomnia May continue to take zolpidem 10mg  at bedtime as needed for insomnia. New prescription sent to her pharmacy today.  - zolpidem (AMBIEN) 10 MG tablet; Take 1 tablet (10 mg total) by mouth at bedtime.  Dispense: 30 tablet; Refill: 3  5. Acquired hypothyroidism Thyroid panel stable. Continue levothyroxine as prescribed   General Counseling: kailena lubas understanding of the findings of todays visit and agrees with plan of treatment. I have discussed any further diagnostic evaluation that may be needed or ordered today. We also reviewed  her medications today. she has been encouraged to call the office with any questions or concerns that should arise related  to todays visit.  This patient was seen by [place] with Dr Lavera Guise as a part of collaborative care agreement  Meds ordered this encounter  Medications  . ALPRAZolam (XANAX) 0.25 MG tablet    Sig: Take 1 tablet (0.25 mg total) by mouth daily as needed for anxiety. for anxiety    Dispense:  30 tablet    Refill:  3    Order Specific Question:   Supervising Provider    Answer:   Lavera Guise [[id number]]  . zolpidem (AMBIEN) 10 MG tablet    Sig: Take 1 tablet (10 mg total) by mouth at bedtime.    Dispense:  30 tablet    Refill:  3    Order Specific Question:   Supervising Provider    Answer:   Lavera Guise [[id number]]  . traMADol-acetaminophen (ULTRACET) 37.5-325 MG tablet    Sig: Take 1 tablet by mouth 2 (two) times daily as needed.    Dispense:  60 tablet    Refill:  0    Patient has good RX coupon to use for this prescription.    Order Specific Question:   Supervising Provider    Answer:   Lavera Guise [[id number]]    Total time spent: 35  Minutes   Time spent includes review of chart, medications, test results, and follow up plan with the patient.      Dr Lavera Guise Internal medicine

## 2020-06-01 ENCOUNTER — Other Ambulatory Visit: Payer: Self-pay

## 2020-06-01 MED ORDER — SIMVASTATIN 20 MG PO TABS
20.0000 mg | ORAL_TABLET | Freq: Every day | ORAL | 3 refills | Status: DC
Start: 2020-06-01 — End: 2020-12-29

## 2020-06-01 NOTE — Progress Notes (Signed)
Reviewed with patient during visit 05/31/2020. Had renal imaging done through [facility] hospital system. No evidence of abnormalities or renal stenosis present.

## 2020-06-15 DIAGNOSIS — K449 Diaphragmatic hernia without obstruction or gangrene: Secondary | ICD-10-CM | POA: Insufficient documentation

## 2020-08-09 ENCOUNTER — Encounter: Payer: Self-pay | Admitting: Nurse Practitioner

## 2020-08-09 ENCOUNTER — Ambulatory Visit (INDEPENDENT_AMBULATORY_CARE_PROVIDER_SITE_OTHER): Payer: Medicare HMO | Admitting: Nurse Practitioner

## 2020-08-09 ENCOUNTER — Other Ambulatory Visit: Payer: Self-pay

## 2020-08-09 VITALS — BP 120/79 | HR 77 | Temp 97.5°F | Resp 16 | Ht 66.0 in | Wt 151.2 lb

## 2020-08-09 DIAGNOSIS — I714 Abdominal aortic aneurysm, without rupture, unspecified: Secondary | ICD-10-CM

## 2020-08-09 DIAGNOSIS — Z1231 Encounter for screening mammogram for malignant neoplasm of breast: Secondary | ICD-10-CM

## 2020-08-09 DIAGNOSIS — F5101 Primary insomnia: Secondary | ICD-10-CM

## 2020-08-09 DIAGNOSIS — K449 Diaphragmatic hernia without obstruction or gangrene: Secondary | ICD-10-CM

## 2020-08-09 MED ORDER — ZOLPIDEM TARTRATE 10 MG PO TABS: 10.0000 mg | ORAL_TABLET | Freq: Every day | ORAL | 2 refills | Status: DC

## 2020-08-09 MED ORDER — TRAMADOL-ACETAMINOPHEN 37.5-325 MG PO TABS: 1.0000 | ORAL_TABLET | Freq: Two times a day (BID) | ORAL | 2 refills | Status: DC | PRN

## 2020-08-09 NOTE — Progress Notes (Signed)
[facility] [place], [place] [address]  Internal MEDICINE  Office Visit Note  Patient Name: Joy Carpenter  [id number]  [id number]  Date of Service: 09/02/2020  Chief Complaint  Patient presents with  . Follow-up  . Depression  . Gastroesophageal Reflux  . Hyperlipidemia  . Quality Metric Gaps    covid,flu,tetnaus    The patient is here for routine follow up. She continues to have chronic abdominal discomfort due to hiatal hernia and enlarging AAA. She does see a vascular surgeon for this. She is supposed to have surgery to repair the aneurysm some time In December. Unsure of exact timing for this. Does take does ultracet when needed for moderate to severe pain. She does need to have a new prescription for this today. She takes [place] some nights to help her sleep. She generally takes 1/2 tablet when needed.  He last refill was in 06/2020. The current prescription has two refills, however, prescription expired in 08/04/2020.        Current Medication: Outpatient Encounter Medications as of 08/09/2020  Medication Sig  . ALPRAZolam (XANAX) 0.25 MG tablet Take 1 tablet (0.25 mg total) by mouth daily as needed for anxiety. for anxiety  . levothyroxine (SYNTHROID) 50 MCG tablet TAKE 1 TABLET BY MOUTH EVERY MORNING 30 MINUTES PRIOR TO EATING FOOD  . meloxicam (MOBIC) 15 MG tablet Take 1/2 tab po daily (Patient taking differently: Take 1/2 tab po as needed)  . pantoprazole (PROTONIX) 40 MG tablet Take 1 tablet (40 mg total) by mouth daily.  . simvastatin (ZOCOR) 20 MG tablet Take 1 tablet (20 mg total) by mouth daily.  . traMADol-acetaminophen (ULTRACET) 37.5-325 MG tablet Take 1 tablet by mouth 2 (two) times daily as needed.  . triamcinolone (KENALOG) 0.025 % cream Apply 1 application topically 2 (two) times daily.  . valACYclovir (VALTREX) 500 MG tablet Take 1 tablet (500 mg total) by mouth 2 (two) times daily.  Marland Kitchen zolpidem (AMBIEN) 10 MG tablet Take 1  tablet (10 mg total) by mouth at bedtime.  . [DISCONTINUED] traMADol-acetaminophen (ULTRACET) 37.5-325 MG tablet Take 1 tablet by mouth 2 (two) times daily as needed.  . [DISCONTINUED] zolpidem (AMBIEN) 10 MG tablet Take 1 tablet (10 mg total) by mouth at bedtime.   No facility-administered encounter medications on file as of 08/09/2020.    Surgical History: Past Surgical History:  Procedure Laterality Date  . COLONOSCOPY    . COLONOSCOPY WITH PROPOFOL N/A 06/14/2016   Procedure: COLONOSCOPY WITH PROPOFOL;  Surgeon: Lucilla Lame, MD;  Location: [place];  Service: Endoscopy;  Laterality: N/A;  . ESOPHAGOGASTRODUODENOSCOPY (EGD) WITH PROPOFOL N/A 06/14/2016   Procedure: ESOPHAGOGASTRODUODENOSCOPY (EGD) WITH PROPOFOL;  Surgeon: Lucilla Lame, MD;  Location: [place];  Service: Endoscopy;  Laterality: N/A;  . TUBAL LIGATION      Medical History: Past Medical History:  Diagnosis Date  . Anxiety   . Aortic aneurysm ([place])   . Carpal tunnel syndrome 2019  . Depression   . Emphysema of lung ([place])    "mild"  . GERD (gastroesophageal reflux disease)   . Headache    daily - stress  . Herpes   . Hyperlipidemia   . Insomnia   . Osteoporosis   . Vertigo    1 episode - approx 2010   . Weakness of both legs    intermittent    Family History: Family History  Problem Relation Age of Onset  . Leukemia Mother   . Aneurysm  Other     Social History   Socioeconomic History  . Marital status: Divorced    Spouse name: Not on file  . Number of children: Not on file  . Years of education: Not on file  . Highest education level: Not on file  Occupational History  . Not on file  Tobacco Use  . Smoking status: Current Every Day Smoker    Packs/day: 1.00    Years: 30.00    Pack years: 30.00    Types: Cigarettes  . Smokeless tobacco: Never Used  Substance and Sexual Activity  . Alcohol use: No  . Drug use: Never  . Sexual activity: Not on file  Other Topics Concern   . Not on file  Social History Narrative  . Not on file   Social Determinants of Health   Financial Resource Strain:   . Difficulty of Paying Living Expenses: Not on file  Food Insecurity:   . Worried About Charity fundraiser in the Last Year: Not on file  . Ran Out of Food in the Last Year: Not on file  Transportation Needs:   . Lack of Transportation (Medical): Not on file  . Lack of Transportation (Non-Medical): Not on file  Physical Activity:   . Days of Exercise per Week: Not on file  . Minutes of Exercise per Session: Not on file  Stress:   . Feeling of Stress : Not on file  Social Connections:   . Frequency of Communication with Friends and Family: Not on file  . Frequency of Social Gatherings with Friends and Family: Not on file  . Attends Religious Services: Not on file  . Active Member of Clubs or Organizations: Not on file  . Attends Archivist Meetings: Not on file  . Marital Status: Not on file  Intimate Partner Violence:   . Fear of Current or Ex-Partner: Not on file  . Emotionally Abused: Not on file  . Physically Abused: Not on file  . Sexually Abused: Not on file      Review of Systems  Constitutional: Negative for activity change, chills, fatigue and unexpected weight change.  HENT: Negative for congestion, postnasal drip, rhinorrhea, sneezing and sore throat.   Respiratory: Negative for cough, chest tightness, shortness of breath and wheezing.   Cardiovascular: Negative for chest pain and palpitations.  Gastrointestinal: Positive for abdominal pain and constipation. Negative for diarrhea, nausea and vomiting.  Endocrine: Negative for cold intolerance, heat intolerance, polydipsia and polyuria.  Musculoskeletal: Positive for arthralgias. Negative for back pain, joint swelling and neck pain.       Has developed some carpal tunnel pain since she started working again. Keeps work days to three day per week. Due to increased pain.   Skin: Negative  for rash.  Allergic/Immunologic: Negative for environmental allergies.  Neurological: Negative for dizziness, tremors, numbness and headaches.  Hematological: Negative for adenopathy. Does not bruise/bleed easily.  Psychiatric/Behavioral: Positive for sleep disturbance. Negative for behavioral problems (Depression) and suicidal ideas. The patient is nervous/anxious.     Today's Vitals   08/09/20 1431  BP: 120/79  Pulse: 77  Resp: 16  Temp: (!) 97.5 F (36.4 C)  SpO2: 97%  Weight: 151 lb 3.2 oz (68.6 kg)  Height: 5\' 6"  (1.676 m)   Body mass index is 24.4 kg/m.  Physical Exam Vitals and nursing note reviewed.  Constitutional:      General: She is not in acute distress.    Appearance: Normal appearance. She is  well-developed. She is not diaphoretic.  HENT:     Head: Normocephalic and atraumatic.     Mouth/Throat:     Pharynx: No oropharyngeal exudate.  Eyes:     Pupils: Pupils are equal, round, and reactive to light.  Neck:     Thyroid: No thyromegaly.     Vascular: No carotid bruit or JVD.     Trachea: No tracheal deviation.  Cardiovascular:     Rate and Rhythm: Normal rate and regular rhythm.     Heart sounds: Normal heart sounds. No murmur heard.  No friction rub. No gallop.   Pulmonary:     Effort: Pulmonary effort is normal. No respiratory distress.     Breath sounds: Normal breath sounds. No wheezing or rales.  Chest:     Chest wall: No tenderness.  Abdominal:     General: Bowel sounds are normal.     Palpations: Abdomen is soft.     Tenderness: There is abdominal tenderness.     Comments: mimld abdominal tenderness with mild/moderate palpation.   Musculoskeletal:        General: Normal range of motion.     Cervical back: Normal range of motion and neck supple.  Lymphadenopathy:     Cervical: No cervical adenopathy.  Skin:    General: Skin is warm and dry.  Neurological:     General: No focal deficit present.     Mental Status: She is alert and oriented  to person, place, and time.     Cranial Nerves: No cranial nerve deficit.  Psychiatric:        Mood and Affect: Mood normal.        Behavior: Behavior normal.        Thought Content: Thought content normal.        Judgment: Judgment normal.    Assessment/Plan: 1. Hernia, hiatal Increasing in size and causing increased abdominal pain. May continue to take tramadol/APAP 37.5/325mg  tablets twice daily as needed for moderate to severe pain. New prescription sent to her pharmacy today.  - traMADol-acetaminophen (ULTRACET) 37.5-325 MG tablet; Take 1 tablet by mouth 2 (two) times daily as needed.  Dispense: 60 tablet; Refill: 2  2. Abdominal aortic aneurysm (AAA) without rupture ([place]) conitnue regular visits with vein and vascular surgeon. May take prescribed tramadol as needed and as prescribed  - traMADol-acetaminophen (ULTRACET) 37.5-325 MG tablet; Take 1 tablet by mouth 2 (two) times daily as needed.  Dispense: 60 tablet; Refill: 2  3. Primary insomnia May take ambien as needed for insomnia. New prescription sent to her pharmacy today.  - zolpidem (AMBIEN) 10 MG tablet; Take 1 tablet (10 mg total) by mouth at bedtime.  Dispense: 30 tablet; Refill: 2  4. Encounter for screening mammogram for malignant neoplasm of breast - MM DIGITAL SCREENING BILATERAL; Future  General Counseling: irais mottram understanding of the findings of todays visit and agrees with plan of treatment. I have discussed any further diagnostic evaluation that may be needed or ordered today. We also reviewed her medications today. she has been encouraged to call the office with any questions or concerns that should arise related to todays visit.  This patient was seen by Leretha Pol FNP Collaboration with Dr Lavera Guise as a part of collaborative care agreement  Orders Placed This Encounter  Procedures  . MM DIGITAL SCREENING BILATERAL    Meds ordered this encounter  Medications  . traMADol-acetaminophen  (ULTRACET) 37.5-325 MG tablet    Sig: Take 1 tablet by  mouth 2 (two) times daily as needed.    Dispense:  60 tablet    Refill:  2    Patient has good RX coupon to use for this prescription.    Order Specific Question:   Supervising Provider    Answer:   Lavera Guise [[id number]]  . zolpidem (AMBIEN) 10 MG tablet    Sig: Take 1 tablet (10 mg total) by mouth at bedtime.    Dispense:  30 tablet    Refill:  2    Order Specific Question:   Supervising Provider    Answer:   Lavera Guise [[id number]]    Total time spent: 30 Minutes   Time spent includes review of chart, medications, test results, and follow up plan with the patient.      Dr Lavera Guise Internal medicine

## 2020-09-02 DIAGNOSIS — Z1231 Encounter for screening mammogram for malignant neoplasm of breast: Secondary | ICD-10-CM | POA: Insufficient documentation

## 2020-10-06 ENCOUNTER — Telehealth: Payer: Self-pay

## 2020-10-06 ENCOUNTER — Other Ambulatory Visit: Payer: Self-pay | Admitting: Nurse Practitioner

## 2020-10-06 DIAGNOSIS — F411 Generalized anxiety disorder: Secondary | ICD-10-CM

## 2020-10-06 MED ORDER — ALPRAZOLAM 0.25 MG PO TABS: 0.2500 mg | ORAL_TABLET | Freq: Every day | ORAL | 2 refills | Status: DC | PRN

## 2020-10-06 NOTE — Telephone Encounter (Signed)
Done and sent to her pharmacy

## 2020-10-14 ENCOUNTER — Other Ambulatory Visit: Payer: Self-pay

## 2020-10-14 DIAGNOSIS — R5383 Other fatigue: Secondary | ICD-10-CM

## 2020-10-14 DIAGNOSIS — Z79899 Other long term (current) drug therapy: Secondary | ICD-10-CM

## 2020-10-14 DIAGNOSIS — Z1239 Encounter for other screening for malignant neoplasm of breast: Secondary | ICD-10-CM

## 2020-10-14 DIAGNOSIS — F411 Generalized anxiety disorder: Secondary | ICD-10-CM

## 2020-10-14 DIAGNOSIS — Z0001 Encounter for general adult medical examination with abnormal findings: Secondary | ICD-10-CM

## 2020-10-14 DIAGNOSIS — K219 Gastro-esophageal reflux disease without esophagitis: Secondary | ICD-10-CM

## 2020-10-14 DIAGNOSIS — E559 Vitamin D deficiency, unspecified: Secondary | ICD-10-CM

## 2020-10-14 DIAGNOSIS — F5101 Primary insomnia: Secondary | ICD-10-CM

## 2020-10-14 DIAGNOSIS — Z124 Encounter for screening for malignant neoplasm of cervix: Secondary | ICD-10-CM

## 2020-10-14 DIAGNOSIS — R109 Unspecified abdominal pain: Secondary | ICD-10-CM

## 2020-10-14 DIAGNOSIS — R300 Dysuria: Secondary | ICD-10-CM

## 2020-10-14 MED ORDER — PANTOPRAZOLE SODIUM 40 MG PO TBEC: 40.0000 mg | DELAYED_RELEASE_TABLET | Freq: Every day | ORAL | 1 refills | Status: DC

## 2020-10-18 ENCOUNTER — Other Ambulatory Visit: Payer: Self-pay

## 2020-10-18 DIAGNOSIS — E039 Hypothyroidism, unspecified: Secondary | ICD-10-CM

## 2020-10-18 MED ORDER — LEVOTHYROXINE SODIUM 50 MCG PO TABS: ORAL_TABLET | ORAL | 1 refills | Status: DC

## 2020-11-10 ENCOUNTER — Ambulatory Visit: Payer: Medicare HMO | Admitting: Hospice and Palliative Medicine

## 2020-11-10 DIAGNOSIS — Z01812 Encounter for preprocedural laboratory examination: Secondary | ICD-10-CM | POA: Diagnosis not present

## 2020-11-10 DIAGNOSIS — I071 Rheumatic tricuspid insufficiency: Secondary | ICD-10-CM | POA: Diagnosis not present

## 2020-11-10 DIAGNOSIS — Z20822 Contact with and (suspected) exposure to covid-19: Secondary | ICD-10-CM | POA: Diagnosis not present

## 2020-11-10 DIAGNOSIS — I716 Thoracoabdominal aortic aneurysm, without rupture: Secondary | ICD-10-CM | POA: Diagnosis not present

## 2020-11-15 DIAGNOSIS — E039 Hypothyroidism, unspecified: Secondary | ICD-10-CM | POA: Diagnosis not present

## 2020-11-15 DIAGNOSIS — I701 Atherosclerosis of renal artery: Secondary | ICD-10-CM | POA: Diagnosis not present

## 2020-11-15 DIAGNOSIS — J90 Pleural effusion, not elsewhere classified: Secondary | ICD-10-CM | POA: Diagnosis not present

## 2020-11-15 DIAGNOSIS — Z6823 Body mass index (BMI) 23.0-23.9, adult: Secondary | ICD-10-CM | POA: Diagnosis not present

## 2020-11-15 DIAGNOSIS — E872 Acidosis: Secondary | ICD-10-CM | POA: Diagnosis not present

## 2020-11-15 DIAGNOSIS — N280 Ischemia and infarction of kidney: Secondary | ICD-10-CM | POA: Diagnosis not present

## 2020-11-15 DIAGNOSIS — F17210 Nicotine dependence, cigarettes, uncomplicated: Secondary | ICD-10-CM | POA: Diagnosis not present

## 2020-11-15 DIAGNOSIS — Z48812 Encounter for surgical aftercare following surgery on the circulatory system: Secondary | ICD-10-CM | POA: Diagnosis not present

## 2020-11-15 DIAGNOSIS — R918 Other nonspecific abnormal finding of lung field: Secondary | ICD-10-CM | POA: Diagnosis not present

## 2020-11-15 DIAGNOSIS — I716 Thoracoabdominal aortic aneurysm, without rupture: Secondary | ICD-10-CM | POA: Diagnosis not present

## 2020-11-15 DIAGNOSIS — Z006 Encounter for examination for normal comparison and control in clinical research program: Secondary | ICD-10-CM | POA: Diagnosis not present

## 2020-11-15 DIAGNOSIS — E8770 Fluid overload, unspecified: Secondary | ICD-10-CM | POA: Diagnosis not present

## 2020-11-15 DIAGNOSIS — D62 Acute posthemorrhagic anemia: Secondary | ICD-10-CM | POA: Diagnosis not present

## 2020-11-15 DIAGNOSIS — D689 Coagulation defect, unspecified: Secondary | ICD-10-CM | POA: Diagnosis not present

## 2020-11-15 DIAGNOSIS — G8918 Other acute postprocedural pain: Secondary | ICD-10-CM | POA: Diagnosis not present

## 2020-11-15 DIAGNOSIS — I9581 Postprocedural hypotension: Secondary | ICD-10-CM | POA: Diagnosis not present

## 2020-11-15 DIAGNOSIS — I959 Hypotension, unspecified: Secondary | ICD-10-CM | POA: Diagnosis not present

## 2020-11-15 DIAGNOSIS — Z9889 Other specified postprocedural states: Secondary | ICD-10-CM | POA: Diagnosis not present

## 2020-11-15 DIAGNOSIS — K449 Diaphragmatic hernia without obstruction or gangrene: Secondary | ICD-10-CM | POA: Diagnosis not present

## 2020-11-15 DIAGNOSIS — Z95828 Presence of other vascular implants and grafts: Secondary | ICD-10-CM | POA: Diagnosis not present

## 2020-11-15 DIAGNOSIS — Z01818 Encounter for other preprocedural examination: Secondary | ICD-10-CM | POA: Diagnosis not present

## 2020-11-15 DIAGNOSIS — Z20822 Contact with and (suspected) exposure to covid-19: Secondary | ICD-10-CM | POA: Diagnosis not present

## 2020-11-16 HISTORY — PX: THORACIC AORTIC ANEURYSM REPAIR: SHX799

## 2020-12-05 ENCOUNTER — Other Ambulatory Visit: Payer: Self-pay | Admitting: Nurse Practitioner

## 2020-12-05 DIAGNOSIS — F5101 Primary insomnia: Secondary | ICD-10-CM

## 2020-12-29 ENCOUNTER — Ambulatory Visit (INDEPENDENT_AMBULATORY_CARE_PROVIDER_SITE_OTHER): Payer: Medicare HMO | Admitting: Physician Assistant

## 2020-12-29 ENCOUNTER — Other Ambulatory Visit: Payer: Self-pay

## 2020-12-29 ENCOUNTER — Encounter: Payer: Self-pay | Admitting: Physician Assistant

## 2020-12-29 VITALS — BP 172/96 | HR 93 | Temp 97.4°F | Resp 16 | Ht 67.0 in | Wt 137.0 lb

## 2020-12-29 DIAGNOSIS — F5101 Primary insomnia: Secondary | ICD-10-CM

## 2020-12-29 DIAGNOSIS — I714 Abdominal aortic aneurysm, without rupture, unspecified: Secondary | ICD-10-CM

## 2020-12-29 DIAGNOSIS — F411 Generalized anxiety disorder: Secondary | ICD-10-CM

## 2020-12-29 DIAGNOSIS — R109 Unspecified abdominal pain: Secondary | ICD-10-CM | POA: Diagnosis not present

## 2020-12-29 DIAGNOSIS — K449 Diaphragmatic hernia without obstruction or gangrene: Secondary | ICD-10-CM | POA: Diagnosis not present

## 2020-12-29 DIAGNOSIS — R030 Elevated blood-pressure reading, without diagnosis of hypertension: Secondary | ICD-10-CM

## 2020-12-29 DIAGNOSIS — D229 Melanocytic nevi, unspecified: Secondary | ICD-10-CM | POA: Diagnosis not present

## 2020-12-29 MED ORDER — TRAMADOL-ACETAMINOPHEN 37.5-325 MG PO TABS: 1.0000 | ORAL_TABLET | Freq: Two times a day (BID) | ORAL | 1 refills | Status: DC | PRN

## 2020-12-29 MED ORDER — ALPRAZOLAM 0.25 MG PO TABS: 0.2500 mg | ORAL_TABLET | Freq: Every day | ORAL | 1 refills | Status: DC | PRN

## 2020-12-29 MED ORDER — ZOLPIDEM TARTRATE 10 MG PO TABS: 10.0000 mg | ORAL_TABLET | Freq: Every day | ORAL | 1 refills | Status: DC

## 2020-12-29 NOTE — Progress Notes (Signed)
[facility] [place], [place] [address]  Internal MEDICINE  Office Visit Note  Patient Name: Joy Carpenter  [id number]  [id number]  Date of Service: 01/01/2021  Chief Complaint  Patient presents with  . Follow-up    Refill request, pain in left side, headaches across back of head, surgery 11-16-20, everything started after surgery  . Depression  . Gastroesophageal Reflux  . Hyperlipidemia  . COPD  . Anxiety    HPI Pt is here for f/u. -She had TAAA repair in feb and had a low BP in the hospital, bu states it has been high since then. Highest 190/100 since she has been home. After she came home she was having headaches and was told it was likely from LP.  She had custom graft to aorta, stent in celiac artery, graft iliac bilaterally, stents in both renal arteries, and SMA and thoracic graft. She spent 5 days in the hospital. Per vascular they did not want her on any BP meds, however BP continues to rise/stay elevated since being home. BP recheck in office 168/98. She id due to see vascular for f/u on 3/31.  -She uses Xanax prn, and ambien 1/2 tab at night and is requesting refills of these. -She has a ventral hernia at the same time as TAAA but has not been repaired. Pain in LLQ at times that has been more frequent since the surgery. Discussed that this needs to be addressed with surgeon to see if related to surgery/access point as well as determine if/when she could be evaluated for her hernia repair. She is requesting ultracet refill in the meantime due to her continued abdominal pain.   Current Medication: Outpatient Encounter Medications as of 12/29/2020  Medication Sig  . atorvastatin (LIPITOR) 40 MG tablet   . clopidogrel (PLAVIX) 75 MG tablet Take 75 mg by mouth daily.  Marland Kitchen levothyroxine (SYNTHROID) 50 MCG tablet TAKE 1 TABLET BY MOUTH EVERY MORNING 30 MINUTES PRIOR TO EATING FOOD  . meloxicam (MOBIC) 15 MG tablet Take 1/2 tab po daily (Patient taking  differently: Take 1/2 tab po as needed)  . pantoprazole (PROTONIX) 40 MG tablet Take 1 tablet (40 mg total) by mouth daily.  Marland Kitchen triamcinolone (KENALOG) 0.025 % cream Apply 1 application topically 2 (two) times daily.  . valACYclovir (VALTREX) 500 MG tablet Take 1 tablet (500 mg total) by mouth 2 (two) times daily.  . [DISCONTINUED] ALPRAZolam (XANAX) 0.25 MG tablet Take 1 tablet (0.25 mg total) by mouth daily as needed for anxiety. for anxiety  . [DISCONTINUED] traMADol-acetaminophen (ULTRACET) 37.5-325 MG tablet Take 1 tablet by mouth 2 (two) times daily as needed.  . [DISCONTINUED] zolpidem (AMBIEN) 10 MG tablet Take 1 tablet (10 mg total) by mouth at bedtime.  . ALPRAZolam (XANAX) 0.25 MG tablet Take 1 tablet (0.25 mg total) by mouth daily as needed for anxiety. for anxiety  . traMADol-acetaminophen (ULTRACET) 37.5-325 MG tablet Take 1 tablet by mouth 2 (two) times daily as needed.  . zolpidem (AMBIEN) 10 MG tablet Take 1 tablet (10 mg total) by mouth at bedtime.  . [DISCONTINUED] simvastatin (ZOCOR) 20 MG tablet Take 1 tablet (20 mg total) by mouth daily. (Patient not taking: Reported on 12/29/2020)   No facility-administered encounter medications on file as of 12/29/2020.    Surgical History: Past Surgical History:  Procedure Laterality Date  . aortic aneurysm surgery    . COLONOSCOPY    . COLONOSCOPY WITH PROPOFOL N/A 06/14/2016   Procedure: COLONOSCOPY WITH  PROPOFOL;  Surgeon: Lucilla Lame, MD;  Location: [place];  Service: Endoscopy;  Laterality: N/A;  . ESOPHAGOGASTRODUODENOSCOPY (EGD) WITH PROPOFOL N/A 06/14/2016   Procedure: ESOPHAGOGASTRODUODENOSCOPY (EGD) WITH PROPOFOL;  Surgeon: Lucilla Lame, MD;  Location: [place];  Service: Endoscopy;  Laterality: N/A;  . TUBAL LIGATION      Medical History: Past Medical History:  Diagnosis Date  . Anxiety   . Aortic aneurysm ([place])   . Carpal tunnel syndrome 2019  . Depression   . Emphysema of lung ([place])    "mild"  .  GERD (gastroesophageal reflux disease)   . Headache    daily - stress  . Herpes   . Hyperlipidemia   . Insomnia   . Osteoporosis   . Vertigo    1 episode - approx 2010   . Weakness of both legs    intermittent    Family History: Family History  Problem Relation Age of Onset  . Leukemia Mother   . Aneurysm Other     Social History   Socioeconomic History  . Marital status: Divorced    Spouse name: Not on file  . Number of children: Not on file  . Years of education: Not on file  . Highest education level: Not on file  Occupational History  . Not on file  Tobacco Use  . Smoking status: Current Every Day Smoker    Packs/day: 1.00    Years: 30.00    Pack years: 30.00    Types: Cigarettes  . Smokeless tobacco: Never Used  Substance and Sexual Activity  . Alcohol use: No  . Drug use: Never  . Sexual activity: Not on file  Other Topics Concern  . Not on file  Social History Narrative  . Not on file   Social Determinants of Health   Financial Resource Strain: Not on file  Food Insecurity: Not on file  Transportation Needs: Not on file  Physical Activity: Not on file  Stress: Not on file  Social Connections: Not on file  Intimate Partner Violence: Not on file      Review of Systems  Constitutional: Negative for chills, fatigue and unexpected weight change.  HENT: Negative for congestion, postnasal drip, rhinorrhea, sneezing and sore throat.   Eyes: Negative for redness and visual disturbance.  Respiratory: Negative for cough, chest tightness and shortness of breath.   Cardiovascular: Negative for chest pain and palpitations.  Gastrointestinal: Positive for abdominal distention and abdominal pain. Negative for constipation, diarrhea, nausea and vomiting.  Genitourinary: Negative for dysuria and frequency.  Musculoskeletal: Negative for arthralgias, back pain, joint swelling and neck pain.  Skin: Negative for rash.  Neurological: Positive for headaches.  Negative for tremors and numbness.  Hematological: Negative for adenopathy. Does not bruise/bleed easily.  Psychiatric/Behavioral: Positive for sleep disturbance. Negative for behavioral problems (Depression) and suicidal ideas. The patient is nervous/anxious.     Vital Signs: BP (!) 172/96   Pulse 93   Temp (!) 97.4 F (36.3 C)   Resp 16   Ht 5\' 7"  (1.702 m)   Wt 137 lb (62.1 kg)   SpO2 99%   BMI 21.46 kg/m    Physical Exam Constitutional:      General: She is not in acute distress.    Appearance: She is well-developed and normal weight. She is not diaphoretic.  HENT:     Head: Normocephalic and atraumatic.     Mouth/Throat:     Pharynx: No oropharyngeal exudate.  Eyes:  Pupils: Pupils are equal, round, and reactive to light.  Neck:     Thyroid: No thyromegaly.     Vascular: No JVD.     Trachea: No tracheal deviation.  Cardiovascular:     Rate and Rhythm: Normal rate and regular rhythm.     Heart sounds: Normal heart sounds. No murmur heard. No friction rub. No gallop.   Pulmonary:     Effort: Pulmonary effort is normal. No respiratory distress.     Breath sounds: No wheezing or rales.  Chest:     Chest wall: No tenderness.  Abdominal:     General: Bowel sounds are normal.     Palpations: Abdomen is soft.     Tenderness: There is abdominal tenderness.     Hernia: A hernia is present.  Musculoskeletal:        General: Normal range of motion.     Cervical back: Normal range of motion and neck supple.  Lymphadenopathy:     Cervical: No cervical adenopathy.  Skin:    General: Skin is warm and dry.     Findings: Lesion present.     Comments: Atypical nevi on R upper arm  Neurological:     Mental Status: She is alert and oriented to person, place, and time.     Cranial Nerves: No cranial nerve deficit.  Psychiatric:        Behavior: Behavior normal.        Thought Content: Thought content normal.        Judgment: Judgment normal.         Assessment/Plan: 1. Generalized anxiety disorder May continue to use xanax prn for acute anxiety. - ALPRAZolam (XANAX) 0.25 MG tablet; Take 1 tablet (0.25 mg total) by mouth daily as needed for anxiety. for anxiety  Dispense: 30 tablet; Refill: 1  2. Hernia, hiatal Will need further evaluation once cleared by vascular. May take ultracet prn for severe pain. - traMADol-acetaminophen (ULTRACET) 37.5-325 MG tablet; Take 1 tablet by mouth 2 (two) times daily as needed.  Dispense: 60 tablet; Refill: 1  3. Abdominal aortic aneurysm (AAA) without rupture ([place]) Recent repair in feb, f/u with vascular next week. May take ultracet prn for severe pain. Will continue atorvastatin and plavix. - traMADol-acetaminophen (ULTRACET) 37.5-325 MG tablet; Take 1 tablet by mouth 2 (two) times daily as needed.  Dispense: 60 tablet; Refill: 1  4. Primary insomnia May continue to take 1/2 tab ambien for sleep. - zolpidem (AMBIEN) 10 MG tablet; Take 1 tablet (10 mg total) by mouth at bedtime.  Dispense: 30 tablet; Refill: 1  5. Atypical nevi On R upper arm that pt notes has changed some and requests further evaluation. Will refer to dermatology for bx and further eval. - Ambulatory referral to Dermatology  6. Left sided abdominal pain Educated to discuss with vascular surgeon next week, since has become more painful since surgery.  7. Elevated BP without diagnosis of hypertension Per chart, vascular did not want her on any BP meds secondary to procedure. Per pt BP has been rising at home and was not given any parameters for what BP is acceptable vs concerning. Discussed that pt needs to be treated for high BP based on office readings and home readings. She is going to call vascular office for guidance and request they initiate therapy. Asked pt to call our office if she did not get I ntouch with vascular or if they would like [place] to manage her BP. Pt understood and agreed with  this plan, she has been  concerned regarding BP and is motivated to discuss with their office prior to visit next week.  General Counseling: kessler kopinski understanding of the findings of todays visit and agrees with plan of treatment. I have discussed any further diagnostic evaluation that may be needed or ordered today. We also reviewed her medications today. she has been encouraged to call the office with any questions or concerns that should arise related to todays visit.  Hypertension Counseling:   The following hypertensive lifestyle modification were recommended and discussed:  1. Limiting alcohol intake to less than 1 oz/day of ethanol:(24 oz of beer or 8 oz of wine or 2 oz of 100-proof whiskey). 2. Take baby ASA 81 mg daily. 3. Importance of regular aerobic exercise and losing weight. 4. Reduce dietary saturated fat and cholesterol intake for overall cardiovascular health. 5. Maintaining adequate dietary potassium, calcium, and magnesium intake. 6. Regular monitoring of the blood pressure. 7. Reduce sodium intake to less than 100 mmol/day (less than 2.3 gm of sodium or less than 6 gm of sodium choride)   Orders Placed This Encounter  Procedures  . Ambulatory referral to Dermatology    Meds ordered this encounter  Medications  . ALPRAZolam (XANAX) 0.25 MG tablet    Sig: Take 1 tablet (0.25 mg total) by mouth daily as needed for anxiety. for anxiety    Dispense:  30 tablet    Refill:  1  . traMADol-acetaminophen (ULTRACET) 37.5-325 MG tablet    Sig: Take 1 tablet by mouth 2 (two) times daily as needed.    Dispense:  60 tablet    Refill:  1    Patient has good RX coupon to use for this prescription.  Marland Kitchen zolpidem (AMBIEN) 10 MG tablet    Sig: Take 1 tablet (10 mg total) by mouth at bedtime.    Dispense:  30 tablet    Refill:  1    This patient was seen by Drema [place], PA-C in collaboration with Dr. Clayborn Bigness as a part of collaborative care agreement.   Total time spent:40 Minutes Time  spent includes review of chart, medications, test results, and follow up plan with the patient.      Dr Lavera Guise Internal medicine

## 2020-12-30 DIAGNOSIS — I716 Thoracoabdominal aortic aneurysm, without rupture: Secondary | ICD-10-CM | POA: Diagnosis not present

## 2020-12-30 DIAGNOSIS — K5730 Diverticulosis of large intestine without perforation or abscess without bleeding: Secondary | ICD-10-CM | POA: Diagnosis not present

## 2020-12-30 DIAGNOSIS — R911 Solitary pulmonary nodule: Secondary | ICD-10-CM | POA: Diagnosis not present

## 2021-01-05 DIAGNOSIS — I716 Thoracoabdominal aortic aneurysm, without rupture: Secondary | ICD-10-CM | POA: Diagnosis not present

## 2021-01-09 ENCOUNTER — Telehealth: Payer: Self-pay

## 2021-01-09 ENCOUNTER — Other Ambulatory Visit: Payer: Self-pay | Admitting: Physician Assistant

## 2021-01-09 DIAGNOSIS — I10 Essential (primary) hypertension: Secondary | ICD-10-CM

## 2021-01-09 MED ORDER — BISOPROLOL-HYDROCHLOROTHIAZIDE 2.5-6.25 MG PO TABS: 1.0000 | ORAL_TABLET | Freq: Every day | ORAL | 2 refills | Status: DC

## 2021-01-09 NOTE — Telephone Encounter (Signed)
Please let her know I sent a low dose med for her to [facility]. She should continue to watch BP at home

## 2021-01-09 NOTE — Telephone Encounter (Signed)
Pt.notified

## 2021-02-28 ENCOUNTER — Telehealth: Payer: Self-pay

## 2021-03-01 ENCOUNTER — Other Ambulatory Visit: Payer: Self-pay | Admitting: Physician Assistant

## 2021-03-01 DIAGNOSIS — F411 Generalized anxiety disorder: Secondary | ICD-10-CM

## 2021-03-01 MED ORDER — ALPRAZOLAM 0.25 MG PO TABS: 0.2500 mg | ORAL_TABLET | Freq: Every day | ORAL | 1 refills | Status: DC | PRN

## 2021-03-01 NOTE — Telephone Encounter (Signed)
sent 

## 2021-03-08 ENCOUNTER — Other Ambulatory Visit: Payer: Self-pay

## 2021-03-08 ENCOUNTER — Ambulatory Visit (INDEPENDENT_AMBULATORY_CARE_PROVIDER_SITE_OTHER): Payer: Medicare HMO | Admitting: Internal Medicine

## 2021-03-08 ENCOUNTER — Encounter: Payer: Self-pay | Admitting: Internal Medicine

## 2021-03-08 VITALS — BP 140/72 | HR 55 | Temp 97.2°F | Resp 16 | Ht 66.0 in | Wt 131.8 lb

## 2021-03-08 DIAGNOSIS — I716 Thoracoabdominal aortic aneurysm, without rupture, unspecified: Secondary | ICD-10-CM

## 2021-03-08 DIAGNOSIS — I700 Atherosclerosis of aorta: Secondary | ICD-10-CM

## 2021-03-08 DIAGNOSIS — R1032 Left lower quadrant pain: Secondary | ICD-10-CM | POA: Diagnosis not present

## 2021-03-08 DIAGNOSIS — R300 Dysuria: Secondary | ICD-10-CM

## 2021-03-08 DIAGNOSIS — R102 Pelvic and perineal pain: Secondary | ICD-10-CM

## 2021-03-08 DIAGNOSIS — I10 Essential (primary) hypertension: Secondary | ICD-10-CM

## 2021-03-08 MED ORDER — CIPROFLOXACIN HCL 500 MG PO TABS: 500.0000 mg | ORAL_TABLET | Freq: Two times a day (BID) | ORAL | 0 refills | Status: AC

## 2021-03-08 MED ORDER — OXYCODONE HCL 5 MG PO CAPS: ORAL_CAPSULE | ORAL | 0 refills | Status: DC

## 2021-03-08 MED ORDER — AMLODIPINE BESYLATE 2.5 MG PO TABS: 2.5000 mg | ORAL_TABLET | Freq: Every day | ORAL | 1 refills | Status: DC

## 2021-03-08 NOTE — Progress Notes (Signed)
[facility] [place], [place] [address]  Internal MEDICINE  Office Visit Note  Patient Name: Joy Carpenter  [id number]  [id number]  Date of Service: 03/14/2021  Chief Complaint  Patient presents with  . Acute Visit    Uti, lower back pain, yellow discharge, a little more urine frequency, sever pain in LLQ/pelvic area, pain that wont let up until pt takes pain meds  . Quality Metric Gaps    Shingrix     HPI Pt is here for a sick visit. 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 She is recovering gradually, is able to lose some wt.  She thinks she might have a UTI symptoms are lower back pain questionable yellow discharge pain in the left lower quadrant and pelvic area.  She thinks pain get worse with walking especially in the left lower quadrant and tramadol is not helping at all she is requesting better pain control today.  She also has pain on the left side of the abdomen this is worse after her surgery patient is scheduled to see her vascular surgeon.  Her last colonoscopy and EGD was in 2017 she is not sure whether she was on 5 or 10-year surveillance.  She was instructed to call her GI Her her pulse and noticed to be on the slow side Blood pressure is under reasonable control She continues to be a smoker  Current Medication:  Outpatient Encounter Medications as of 03/08/2021  Medication Sig  . ALPRAZolam (XANAX) 0.25 MG tablet Take 1 tablet (0.25 mg total) by mouth daily as needed for anxiety. for anxiety  . amLODipine (NORVASC) 2.5 MG tablet Take 1 tablet (2.5 mg total) by mouth daily. For htn  . bisoprolol-hydrochlorothiazide (ZIAC) 2.5-6.25 MG tablet Take 1 tablet by mouth daily.  . ciprofloxacin (CIPRO) 500 MG tablet Take 1 tablet (500 mg total) by mouth 2 (two) times daily for 10  days.  Marland Kitchen levothyroxine (SYNTHROID) 50 MCG tablet TAKE 1 TABLET BY MOUTH EVERY MORNING 30 MINUTES PRIOR TO EATING FOOD  . meloxicam (MOBIC) 15 MG tablet Take 1/2 tab po daily (Patient taking differently: Take 1/2 tab po as needed)  . oxycodone (OXY-IR) 5 MG capsule Take one tab po qd prn for flank pain  . pantoprazole (PROTONIX) 40 MG tablet Take 1 tablet (40 mg total) by mouth daily.  . simvastatin (ZOCOR) 20 MG tablet Take by mouth.  . traMADol-acetaminophen (ULTRACET) 37.5-325 MG tablet Take 1 tablet by mouth 2 (two) times daily as needed.  . triamcinolone (KENALOG) 0.025 % cream Apply 1 application topically 2 (two) times daily.  . valACYclovir (VALTREX) 500 MG tablet Take 1 tablet (500 mg total) by mouth 2 (two) times daily.  Marland Kitchen zolpidem (AMBIEN) 10 MG tablet Take 1 tablet (10 mg total) by mouth at bedtime.  . [DISCONTINUED] atorvastatin (LIPITOR) 40 MG tablet  (Patient not taking: Reported on 03/08/2021)  . [DISCONTINUED] clopidogrel (PLAVIX) 75 MG tablet Take 75 mg by mouth daily. (Patient not taking: Reported on 03/08/2021)   No facility-administered encounter medications on file as of 03/08/2021.      Medical History: Past Medical History:  Diagnosis Date  . Anxiety   . Aortic aneurysm ([place])   . Carpal tunnel syndrome 2019  . Depression   . Emphysema of lung ([place])    "mild"  . GERD (gastroesophageal  reflux disease)   . Headache    daily - stress  . Herpes   . Hyperlipidemia   . Insomnia   . Osteoporosis   . Vertigo    1 episode - approx 2010   . Weakness of both legs    intermittent   Social History   Socioeconomic History  . Marital status: Divorced    Spouse name: Not on file  . Number of children: Not on file  . Years of education: Not on file  . Highest education level: Not on file  Occupational History  . Not on file  Tobacco Use  . Smoking status: Current Every Day Smoker    Packs/day: 1.00    Years: 30.00    Pack years: 30.00    Types: Cigarettes  .  Smokeless tobacco: Never Used  Substance and Sexual Activity  . Alcohol use: No  . Drug use: Never  . Sexual activity: Not on file  Other Topics Concern  . Not on file  Social History Narrative  . Not on file   Social Determinants of Health   Financial Resource Strain: Not on file  Food Insecurity: Not on file  Transportation Needs: Not on file  Physical Activity: Not on file  Stress: Not on file  Social Connections: Not on file  Intimate Partner Violence: Not on file   Past Surgical History:  Procedure Laterality Date  . aortic aneurysm surgery    . COLONOSCOPY    . COLONOSCOPY WITH PROPOFOL N/A 06/14/2016   Procedure: COLONOSCOPY WITH PROPOFOL;  Surgeon: Lucilla Lame, MD;  Location: [place];  Service: Endoscopy;  Laterality: N/A;  . ESOPHAGOGASTRODUODENOSCOPY (EGD) WITH PROPOFOL N/A 06/14/2016   Procedure: ESOPHAGOGASTRODUODENOSCOPY (EGD) WITH PROPOFOL;  Surgeon: Lucilla Lame, MD;  Location: [place];  Service: Endoscopy;  Laterality: N/A;  . TUBAL LIGATION      Vital Signs: BP 140/72   Pulse (!) 55   Temp (!) 97.2 F (36.2 C)   Resp 16   Ht 5\' 6"  (1.676 m)   Wt 131 lb 12.8 oz (59.8 kg)   SpO2 97%   BMI 21.27 kg/m    Review of Systems  Constitutional: Negative for chills, diaphoresis, fatigue and fever.  HENT: Negative for congestion, ear pain, mouth sores, postnasal drip and sinus pressure.   Eyes: Negative for photophobia, discharge, redness, itching and visual disturbance.  Respiratory: Negative for cough, shortness of breath and wheezing.   Cardiovascular: Negative for chest pain, palpitations and leg swelling.  Gastrointestinal: Positive for abdominal pain. Negative for constipation, diarrhea, nausea and vomiting.  Genitourinary: Positive for dysuria, flank pain and pelvic pain.  Musculoskeletal: Negative for arthralgias, back pain, gait problem and neck pain.  Skin: Negative for color change.  Allergic/Immunologic: Negative for  environmental allergies and food allergies.  Neurological: Negative for dizziness and headaches.  Hematological: Does not bruise/bleed easily.  Psychiatric/Behavioral: Negative.  Negative for agitation, behavioral problems (depression) and hallucinations.    Physical Exam Constitutional:      General: She is not in acute distress.    Appearance: She is well-developed. She is not diaphoretic.  HENT:     Head: Normocephalic and atraumatic.     Mouth/Throat:     Pharynx: No oropharyngeal exudate.  Eyes:     Pupils: Pupils are equal, round, and reactive to light.  Neck:     Thyroid: No thyromegaly.     Vascular: No JVD.     Trachea: No tracheal deviation.  Cardiovascular:  Rate and Rhythm: Normal rate and regular rhythm.     Heart sounds: Normal heart sounds. No murmur heard. No friction rub. No gallop.   Pulmonary:     Effort: Pulmonary effort is normal. No respiratory distress.     Breath sounds: No wheezing or rales.  Chest:     Chest wall: No tenderness.  Abdominal:     General: Bowel sounds are normal.     Palpations: Abdomen is soft.  Musculoskeletal:        General: Normal range of motion.     Cervical back: Normal range of motion and neck supple.  Lymphadenopathy:     Cervical: No cervical adenopathy.  Skin:    General: Skin is warm and dry.  Neurological:     Mental Status: She is alert and oriented to person, place, and time.     Cranial Nerves: No cranial nerve deficit.  Psychiatric:        Behavior: Behavior normal.        Thought Content: Thought content normal.        Judgment: Judgment normal.       Assessment/Plan: 1. Dysuria Pt is symptomatic, multifactorial, no definite dx, will treat empirically for now, needs further diagnostics, might need pelvic exam and pap smear as well   - POCT Urinalysis Dipstick - CULTURE, URINE COMPREHENSIVE - ciprofloxacin (CIPRO) 500 MG tablet; Take 1 tablet (500 mg total) by mouth 2 (two) times daily for 10 days.   Dispense: 20 tablet; Refill: 0  2. Left lower quadrant abdominal pain Post surgical vs arthritic or vascular in nature, will monitor pain control with short term use of narcotics as prescribed today with oxycodone 5 mg once a day - US Abdomen Complete; Future  3. Pelvic pain in female Pelvic u/s  - ciprofloxacin (CIPRO) 500 MG tablet; Take 1 tablet (500 mg total) by mouth 2 (two) times daily for 10 days.  Dispense: 20 tablet; Refill: 0 - US PELVIS (TRANSABDOMINAL ONLY); Future  4. 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) S/P surgical correction as above, followed by vascular   5. Benign hypertension Patient is noticed to be bradycardic today here in the office we will DC her bisoprolol and start her Norvasc 2.5 mg once a day titrate as indicated - amLODipine (NORVASC) 2.5 MG tablet; Take 1 tablet (2.5 mg total) by mouth daily. For htn  Dispense: 90 tablet; Refill: 1  6. Aortic atherosclerosis ([place]) Patient is on simvastatin for atherosclerosis  will probably need to change to  Crestor or Lipitor during future appointments since patient is high risk for development of atherosclerosis because of her current smoking status patient would also probably need a follow-up CT scan for surveillance of lung nodule - simvastatin (ZOCOR) 20 MG tablet; Take by mouth.  General Counseling: alexxa sabet understanding of the findings of todays visit and agrees with plan of treatment. I have discussed any further diagnostic evaluation that may be needed or ordered today. We also reviewed her medications today. she has been encouraged to call the office with any questions or concerns that should arise related to todays visit.   Orders Placed This Encounter  Procedures  . CULTURE, URINE COMPREHENSIVE  . US Abdomen Complete  . US PELVIS (TRANSABDOMINAL ONLY)  . POCT Urinalysis Dipstick    Meds ordered this encounter  Medications  . ciprofloxacin (CIPRO) 500 MG tablet    Sig:  Take 1 tablet (500 mg total) by mouth 2 (two) times daily for 10  days.    Dispense:  20 tablet    Refill:  0  . oxycodone (OXY-IR) 5 MG capsule    Sig: Take one tab po qd prn for flank pain    Dispense:  15 capsule    Refill:  0  . amLODipine (NORVASC) 2.5 MG tablet    Sig: Take 1 tablet (2.5 mg total) by mouth daily. For htn    Dispense:  90 tablet    Refill:  1    [place] Controlled Substance Database was reviewed by me.  Time spent:35 Minutes

## 2021-03-13 LAB — CULTURE, URINE COMPREHENSIVE

## 2021-03-15 ENCOUNTER — Telehealth: Payer: Self-pay

## 2021-03-15 LAB — POCT URINALYSIS DIPSTICK
Bilirubin, UA: NEGATIVE
Glucose, UA: NEGATIVE
Nitrite, UA: NEGATIVE
Protein, UA: POSITIVE — AB
Spec Grav, UA: 1.02 (ref 1.010–1.025)
Urobilinogen, UA: 0.2 E.U./dL
pH, UA: 5 (ref 5.0–8.0)

## 2021-03-15 NOTE — Telephone Encounter (Signed)
-----   Message from Lavera Guise, MD sent at 03/14/2021  9:49 PM EDT ----- Results of urine dip

## 2021-03-15 NOTE — Addendum Note (Signed)
Addended by: Jimmye Norman on: 03/15/2021 08:42 AM   Modules accepted: Orders

## 2021-03-24 ENCOUNTER — Telehealth: Payer: Self-pay

## 2021-03-24 ENCOUNTER — Other Ambulatory Visit: Payer: Self-pay

## 2021-03-24 ENCOUNTER — Other Ambulatory Visit: Payer: Self-pay | Admitting: Internal Medicine

## 2021-03-24 MED ORDER — OXYCODONE HCL 5 MG PO CAPS: ORAL_CAPSULE | ORAL | 0 refills | Status: DC

## 2021-03-27 NOTE — Telephone Encounter (Signed)
Medication sent to pharmacy  

## 2021-03-28 ENCOUNTER — Encounter: Payer: Self-pay | Admitting: Dermatology

## 2021-03-28 ENCOUNTER — Other Ambulatory Visit: Payer: Self-pay

## 2021-03-28 ENCOUNTER — Ambulatory Visit: Payer: Medicare HMO | Admitting: Dermatology

## 2021-03-28 DIAGNOSIS — L570 Actinic keratosis: Secondary | ICD-10-CM

## 2021-03-28 DIAGNOSIS — C44629 Squamous cell carcinoma of skin of left upper limb, including shoulder: Secondary | ICD-10-CM | POA: Diagnosis not present

## 2021-03-28 DIAGNOSIS — D485 Neoplasm of uncertain behavior of skin: Secondary | ICD-10-CM

## 2021-03-28 DIAGNOSIS — L578 Other skin changes due to chronic exposure to nonionizing radiation: Secondary | ICD-10-CM

## 2021-03-28 DIAGNOSIS — C4492 Squamous cell carcinoma of skin, unspecified: Secondary | ICD-10-CM

## 2021-03-28 DIAGNOSIS — L814 Other melanin hyperpigmentation: Secondary | ICD-10-CM

## 2021-03-28 HISTORY — DX: Actinic keratosis: L57.0

## 2021-03-28 HISTORY — DX: Squamous cell carcinoma of skin, unspecified: C44.92

## 2021-03-28 MED ORDER — MUPIROCIN 2 % EX OINT: 1.0000 "application " | TOPICAL_OINTMENT | Freq: Every day | CUTANEOUS | 0 refills | Status: DC

## 2021-03-28 NOTE — Progress Notes (Signed)
New Patient Visit  Subjective  Joy Carpenter is a 70 y.o. female who presents for the following: Skin Problem (Patient here today for a spot at right upper arm that had gotten larger but patient scratched most of it off. Not bothersome for patient. She also has a spot at left hand, present for about 1 month and is sore. There is a spot at right cheek that has been there for about 1 year and won't heal. ).  Patient does not have a hx of skin cancer. She does use tanning beds.   The following portions of the chart were reviewed this encounter and updated as appropriate:   Tobacco  Allergies  Meds  Problems  Med Hx  Surg Hx  Fam Hx       Review of Systems:  No other skin or systemic complaints except as noted in HPI or Assessment and Plan.  Objective  Well appearing patient in no apparent distress; mood and affect are within normal limits.  A focused examination was performed including arms, hands. Relevant physical exam findings are noted in the Assessment and Plan.  Left Dorsal Hand 1.1cm scaly pink nodule R/o SCC     Right Upper Arm 0.8cm scaly brown papule  R/o pigmented BCC vs Atypia        Assessment & Plan  Neoplasm of uncertain behavior of skin (2) Left Dorsal Hand  Skin / nail biopsy Type of biopsy: tangential   Informed consent: discussed and consent obtained   Timeout: patient name, date of birth, surgical site, and procedure verified   Patient was prepped and draped in usual sterile fashion: Area prepped with isopropyl alcohol. Anesthesia: the lesion was anesthetized in a standard fashion   Anesthetic:  1% lidocaine w/ epinephrine 1-100,000 buffered w/ 8.4% NaHCO3 Instrument used: flexible razor blade   Hemostasis achieved with: aluminum chloride   Outcome: patient tolerated procedure well   Post-procedure details: wound care instructions given   Additional details:  Mupirocin and a bandage applied  Destruction of lesion  Destruction  method: electrodesiccation and curettage   Informed consent: discussed and consent obtained   Timeout:  patient name, date of birth, surgical site, and procedure verified Patient was prepped and draped in usual sterile fashion: area prepped with isopropyl alcohol. Anesthesia: the lesion was anesthetized in a standard fashion   Anesthetic:  1% lidocaine w/ epinephrine 1-100,000 buffered w/ 8.4% NaHCO3 Curettage performed in three different directions: Yes   Electrodesiccation performed over the curetted area: Yes   Curettage cycles:  3 Final wound size (cm):  1.3 Hemostasis achieved with:  electrodesiccation Outcome: patient tolerated procedure well with no complications   Post-procedure details: wound care instructions given   Additional details:  Mupirocin and a pressure dressing applied  mupirocin ointment (BACTROBAN) 2 % Apply 1 application topically daily. With dressing changes  Specimen 1 - Surgical pathology Differential Diagnosis: R/o SCC  Check Margins: No 1.1cm scaly pink nodule   Right Upper Arm  Skin / nail biopsy Type of biopsy: punch   Informed consent: discussed and consent obtained   Timeout: patient name, date of birth, surgical site, and procedure verified   Patient was prepped and draped in usual sterile fashion: Area prepped with isopropyl alcohol. Anesthesia: the lesion was anesthetized in a standard fashion   Anesthetic:  1% lidocaine w/ epinephrine 1-100,000 buffered w/ 8.4% NaHCO3 Punch size:  4 mm Suture size:  4-0 Suture type: Prolene (polypropylene)   Hemostasis achieved with: suture and  aluminum chloride   Outcome: patient tolerated procedure well   Post-procedure details: wound care instructions given   Additional details:  Mupirocin and a dressing applied  Specimen 2 - Surgical pathology Differential Diagnosis: R/o pigmented BCC vs Atypia  Check Margins: No 0.8cm scaly brown papule    For left hand, discussed likely diagnosis of squamous  cell carcinoma as well as treatment options of ED&C vs Mohs including higher cure rate with Mohs/surgery. Patient prefers to avoid surgery and prefers ED&C so will treat today.  Discussed risk of infection, bleeding, scarring, slow wound healing prior to procedure.  Actinic Damage - Severe, confluent actinic changes with pre-cancerous actinic keratoses  - Severe, chronic, not at goal, secondary to cumulative UV radiation exposure over time - diffuse scaly erythematous macules and papules with underlying dyspigmentation - Discussed Prescription "Field Treatment" for Severe, Chronic Confluent Actinic Changes with Pre-Cancerous Actinic Keratoses Field treatment involves treatment of an entire area of skin that has confluent Actinic Changes (Sun/ Ultraviolet light damage) and PreCancerous Actinic Keratoses by method of PhotoDynamic Therapy (PDT) and/or prescription Topical Chemotherapy agents such as 5-fluorouracil, 5-fluorouracil/calcipotriene, and/or imiquimod.  The purpose is to decrease the number of clinically evident and subclinical PreCancerous lesions to prevent progression to development of skin cancer by chemically destroying early precancer changes that may or may not be visible.  It has been shown to reduce the risk of developing skin cancer in the treated area. As a result of treatment, redness, scaling, crusting, and open sores may occur during treatment course. One or more than one of these methods may be used and may have to be used several times to control, suppress and eliminate the PreCancerous changes. Discussed treatment course, expected reaction, and possible side effects. - Recommend daily broad spectrum sunscreen SPF 30+ to sun-exposed areas, reapply every 2 hours as needed.  - Staying in the shade or wearing long sleeves, sun glasses (UVA+UVB protection) and wide brim hats (4-inch brim around the entire circumference of the hat) are also recommended. - Call for new or changing  lesions. - strongly recommended against tanning bed use. Reviewed significant increased risk of skin cancer. Recommended sunless self-tanner products instead. - Will schedule photodynamic therapy to the face  Return in about 2 weeks (around 04/11/2021) for Suture Removal, schedule PDT, schedule FBSE.  Graciella Belton, RMA, am acting as scribe for Forest Gleason, MD .  Documentation: I have reviewed the above documentation for accuracy and completeness, and I agree with the above.  Forest Gleason, MD

## 2021-03-28 NOTE — Patient Instructions (Addendum)
Wound Care Instructions  Cleanse wound gently with soap and water once a day then pat dry with clean gauze. Apply a thing coat of Petrolatum (petroleum jelly, "Vaseline") over the wound (unless you have an allergy to this). We recommend that you use a new, sterile tube of Vaseline. Do not pick or remove scabs. Do not remove the yellow or white "healing tissue" from the base of the wound.  Cover the wound with fresh, clean, nonstick gauze and secure with paper tape. You may use Band-Aids in place of gauze and tape if the would is small enough, but would recommend trimming much of the tape off as there is often too much. Sometimes Band-Aids can irritate the skin.  You should call the office for your biopsy report after 1 week if you have not already been contacted.  If you experience any problems, such as abnormal amounts of bleeding, swelling, significant bruising, significant pain, or evidence of infection, please call the office immediately.  FOR ADULT SURGERY PATIENTS: If you need something for pain relief you may take 1 extra strength Tylenol (acetaminophen) AND 2 Ibuprofen (200mg each) together every 4 hours as needed for pain. (do not take these if you are allergic to them or if you have a reason you should not take them.) Typically, you may only need pain medication for 1 to 3 days.   If you have any questions or concerns for your doctor, please call our main line at [phone] and press option 4 to reach your doctor's medical assistant. If no one answers, please leave a voicemail as directed and we will return your call as soon as possible. Messages left after 4 pm will be answered the following business day.   You may also send us a message via MyChart. We typically respond to MyChart messages within 1-2 business days.  For prescription refills, please ask your pharmacy to contact our office. Our fax number is [phone].  If you have an urgent issue when the clinic is closed that  cannot wait until the next business day, you can page your doctor at the number below.    Please note that while we do our best to be available for urgent issues outside of office hours, we are not available 24/7.   If you have an urgent issue and are unable to reach us, you may choose to seek medical care at your doctor's office, retail clinic, urgent care center, or emergency room.  If you have a medical emergency, please immediately call 911 or go to the emergency department.  Pager Numbers  - Dr. Kowalski: [phone]  - Dr. Moye: [phone]  - Dr. Wayson: [phone]  In the event of inclement weather, please call our main line at [phone] for an update on the status of any delays or closures.  Dermatology Medication Tips: Please keep the boxes that topical medications come in in order to help keep track of the instructions about where and how to use these. Pharmacies typically print the medication instructions only on the boxes and not directly on the medication tubes.   If your medication is too expensive, please contact our office at [phone] option 4 or send us a message through MyChart.   We are unable to tell what your co-pay for medications will be in advance as this is different depending on your insurance coverage. However, we may be able to find a substitute medication at lower cost or fill out paperwork to get insurance to cover a needed   medication.   If a prior authorization is required to get your medication covered by your insurance company, please allow us 1-2 business days to complete this process.  Drug prices often vary depending on where the prescription is filled and some pharmacies may offer cheaper prices.  The website www.goodrx.com contains coupons for medications through different pharmacies. The prices here do not account for what the cost may be with help from insurance (it may be cheaper with your insurance), but the website can give you the  price if you did not use any insurance.  - You can print the associated coupon and take it with your prescription to the pharmacy.  - You may also stop by our office during regular business hours and pick up a GoodRx coupon card.  - If you need your prescription sent electronically to a different pharmacy, notify our office through [facility] MyChart or by phone at [phone] option 4.   

## 2021-03-31 ENCOUNTER — Ambulatory Visit: Payer: Self-pay | Admitting: Physician Assistant

## 2021-04-04 ENCOUNTER — Telehealth: Payer: Self-pay

## 2021-04-04 NOTE — Telephone Encounter (Signed)
Left vm to screen for 04/05/21 appointment-Toni

## 2021-04-05 ENCOUNTER — Other Ambulatory Visit: Payer: Self-pay | Admitting: Physician Assistant

## 2021-04-05 ENCOUNTER — Other Ambulatory Visit: Payer: Self-pay

## 2021-04-05 ENCOUNTER — Telehealth: Payer: Self-pay

## 2021-04-05 ENCOUNTER — Ambulatory Visit (INDEPENDENT_AMBULATORY_CARE_PROVIDER_SITE_OTHER): Payer: Medicare HMO

## 2021-04-05 DIAGNOSIS — R1032 Left lower quadrant pain: Secondary | ICD-10-CM

## 2021-04-05 DIAGNOSIS — I10 Essential (primary) hypertension: Secondary | ICD-10-CM

## 2021-04-05 NOTE — Telephone Encounter (Signed)
-----   Message from Alfonso Patten, MD sent at 04/04/2021 10:39 AM EDT ----- 1. 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 Skin , right upper arm  ACTINIC KERATOSIS AND SOLAR LENTIGO --> LN2 at follow-up   MAs please call. Thank you!

## 2021-04-12 ENCOUNTER — Other Ambulatory Visit: Payer: Self-pay | Admitting: Nurse Practitioner

## 2021-04-12 ENCOUNTER — Ambulatory Visit: Payer: Medicare HMO

## 2021-04-12 ENCOUNTER — Ambulatory Visit (INDEPENDENT_AMBULATORY_CARE_PROVIDER_SITE_OTHER): Payer: Medicare HMO | Admitting: Dermatology

## 2021-04-12 ENCOUNTER — Other Ambulatory Visit: Payer: Self-pay

## 2021-04-12 DIAGNOSIS — R102 Pelvic and perineal pain: Secondary | ICD-10-CM

## 2021-04-12 DIAGNOSIS — Z85828 Personal history of other malignant neoplasm of skin: Secondary | ICD-10-CM | POA: Diagnosis not present

## 2021-04-12 DIAGNOSIS — L570 Actinic keratosis: Secondary | ICD-10-CM | POA: Diagnosis not present

## 2021-04-12 DIAGNOSIS — E039 Hypothyroidism, unspecified: Secondary | ICD-10-CM

## 2021-04-12 NOTE — Progress Notes (Signed)
   Follow-Up Visit   Subjective  Joy Carpenter is a 70 y.o. female who presents for the following: Suture / Staple Removal (2 weeks f/u suture removal right upper arm- biopsy proven Ak and Solar Lentigo 03-28-21). Biopsy proven SCC on the left dorsum hand treated with EDC 03-28-21.  The following portions of the chart were reviewed this encounter and updated as appropriate:   Tobacco  Allergies  Meds  Problems  Med Hx  Surg Hx  Fam Hx      Review of Systems:  No other skin or systemic complaints except as noted in HPI or Assessment and Plan.  Objective  Well appearing patient in no apparent distress; mood and affect are within normal limits.  A focused examination was performed including face, right upper arm. Relevant physical exam findings are noted in the Assessment and Plan.  Right Upper Arm - Anterior Healing scar   Left Dorsal Hand Well healed scar with no evidence of recurrence, no lymphadenopathy.    Assessment & Plan  AK (actinic keratosis) Right Upper Arm - Anterior  Biopsy proven AK and Solar Lentigo discussed  Treat with LN2 today  Prior to procedure, discussed risks of blister formation, small wound, skin dyspigmentation, or rare scar following cryotherapy. Recommend Vaseline ointment to treated areas while healing.  Encounter for Removal of Sutures - Incision site at the right upper arm is clean, dry and intact - Wound cleansed, sutures removed, wound cleansed and steri strips applied.  - Discussed pathology results showing Ak and Solar Lentigo  - Scars remodel for a full year. - Patient advised to call with any concerns or if they notice any new or changing lesions.   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 Observe for recurrence. Call clinic for new or changing lesions.  Recommend regular skin exams, daily broad-spectrum spf 30+ sunscreen use, and photoprotection.     Return in about 2 months (around 06/13/2021) for TBSE, Hx of Aks, Hx of SCC .  I, Marye Round, CMA, am acting as scribe for Forest Gleason, MD .   Documentation: I have reviewed the above documentation for accuracy and completeness, and I agree with the above.  Forest Gleason, MD

## 2021-04-12 NOTE — Patient Instructions (Signed)

## 2021-04-19 ENCOUNTER — Other Ambulatory Visit: Payer: Self-pay

## 2021-04-19 ENCOUNTER — Ambulatory Visit (INDEPENDENT_AMBULATORY_CARE_PROVIDER_SITE_OTHER): Payer: Medicare HMO

## 2021-04-19 DIAGNOSIS — L570 Actinic keratosis: Secondary | ICD-10-CM | POA: Diagnosis not present

## 2021-04-19 MED ORDER — AMINOLEVULINIC ACID HCL 20 % EX SOLR
1.0000 "application " | Freq: Once | CUTANEOUS | Status: AC
  Administered 2021-04-19: 354 mg via TOPICAL

## 2021-04-19 NOTE — Progress Notes (Signed)
Patient completed PDT therapy today.  1. AK (actinic keratosis) Head - Anterior (Face)  Photodynamic therapy - Head - Anterior (Face) Procedure discussed: discussed risks, benefits, side effects. and alternatives   Prep: site scrubbed/prepped with acetone   Location:  Face Number of lesions:  Multiple Type of treatment:  Blue light Aminolevulinic Acid (see MAR for details): Levulan Number of Levulan sticks used:  1 Incubation time (minutes):  60 Number of minutes under lamp:  16 Number of seconds under lamp:  40 Cooling:  Floor fan Outcome: patient tolerated procedure well with no complications   Post-procedure details: sunscreen applied     

## 2021-04-19 NOTE — Patient Instructions (Signed)

## 2021-04-21 ENCOUNTER — Other Ambulatory Visit: Payer: Self-pay

## 2021-04-21 ENCOUNTER — Ambulatory Visit (INDEPENDENT_AMBULATORY_CARE_PROVIDER_SITE_OTHER): Payer: Medicare HMO | Admitting: Physician Assistant

## 2021-04-21 ENCOUNTER — Encounter: Payer: Self-pay | Admitting: Physician Assistant

## 2021-04-21 DIAGNOSIS — I700 Atherosclerosis of aorta: Secondary | ICD-10-CM

## 2021-04-21 DIAGNOSIS — F411 Generalized anxiety disorder: Secondary | ICD-10-CM

## 2021-04-21 DIAGNOSIS — R102 Pelvic and perineal pain: Secondary | ICD-10-CM

## 2021-04-21 DIAGNOSIS — E039 Hypothyroidism, unspecified: Secondary | ICD-10-CM | POA: Diagnosis not present

## 2021-04-21 DIAGNOSIS — R300 Dysuria: Secondary | ICD-10-CM | POA: Diagnosis not present

## 2021-04-21 DIAGNOSIS — N858 Other specified noninflammatory disorders of uterus: Secondary | ICD-10-CM | POA: Diagnosis not present

## 2021-04-21 DIAGNOSIS — Z113 Encounter for screening for infections with a predominantly sexual mode of transmission: Secondary | ICD-10-CM

## 2021-04-21 DIAGNOSIS — N83201 Unspecified ovarian cyst, right side: Secondary | ICD-10-CM | POA: Diagnosis not present

## 2021-04-21 DIAGNOSIS — I10 Essential (primary) hypertension: Secondary | ICD-10-CM | POA: Diagnosis not present

## 2021-04-21 LAB — POCT URINALYSIS DIPSTICK
Bilirubin, UA: NEGATIVE
Glucose, UA: NEGATIVE
Ketones, UA: NEGATIVE
Nitrite, UA: NEGATIVE
Protein, UA: POSITIVE — AB
Spec Grav, UA: 1.025 (ref 1.010–1.025)
Urobilinogen, UA: 0.2 E.U./dL
pH, UA: 6 (ref 5.0–8.0)

## 2021-04-21 MED ORDER — CIPROFLOXACIN HCL 500 MG PO TABS: 500.0000 mg | ORAL_TABLET | Freq: Two times a day (BID) | ORAL | 0 refills | Status: AC

## 2021-04-21 MED ORDER — LEVOTHYROXINE SODIUM 50 MCG PO TABS: ORAL_TABLET | ORAL | 1 refills | Status: DC

## 2021-04-21 MED ORDER — OXYCODONE HCL 5 MG PO CAPS: ORAL_CAPSULE | ORAL | 0 refills | Status: DC

## 2021-04-21 MED ORDER — ALPRAZOLAM 0.25 MG PO TABS: 0.2500 mg | ORAL_TABLET | Freq: Every day | ORAL | 1 refills | Status: DC | PRN

## 2021-04-21 MED ORDER — OXYCODONE HCL 5 MG PO TABS: ORAL_TABLET | ORAL | 0 refills | Status: DC

## 2021-04-21 NOTE — Progress Notes (Signed)
[facility] [place], [place] [address]  Internal MEDICINE  Office Visit Note  Patient Name: Joy Carpenter  [id number]  [id number]  Date of Service: 04/23/2021  Chief Complaint  Patient presents with   Follow-up    [place] results, med refills, left side pain   Quality Metric Gaps    T-dap, Shingrix vaccine     HPI Pt is here for routine follow up and to discuss recent US -Still has some vaginal discharge, and LLQ pain. Light spotting bleeding with intercourse -Was given ABX for 5 days and it helped but then it came back and she took an additional 5 days and it didn't change anything. Discharge is odorless but whitish color and almost looks like pus to her. Still spotting today. -States pain is not touched by tramadol-tylenol. Was more tolerable on 1/2 tab of oxycodone and is requesting refill today. Discussed 1 final refill will be sent today until she can get in with OBGYN following [place] results showing ovarian cyst on right and cyst/fibroid in uterus. Pt is aware that she should only take 1/2 tab as needed for severe pain and to not take at the same time as her xanax. Pt states she understands and usually takes xanax in AM to help her anxiety. Will order STD testing on urine today due to discharge. Pt may need to have a pap and vaginal swab done, but will defer for now while waiting for urine results and since she is being referred to OBGYN to avoid possible repeat pelvic exam. Pap in 2020 was normal with negative HPV.  Pelvic US: The fundal area of the uterus demonstrates an elliptical cyst measuring 3.2 x 1.8 x 5 cm.  Primary consideration in large cystic degeneration of an intramural fibroid.  The endometrium of the uterus is normal.  The right ovary demonstrates a cyst with low-level internal echoes measuring 13 x 9 x 12 mm.  The left ovary is normal. Abdominal US: The infrarenal segment of the abdominal aorta demonstrates aneurysmal repair with graft placement.   The abdominal aorta is otherwise unremarkable.  There is no evidence of cholelithiasis or evidence of bile duct obstruction.  Current Medication: Outpatient Encounter Medications as of 04/21/2021  Medication Sig   amLODipine (NORVASC) 2.5 MG tablet Take 1 tablet (2.5 mg total) by mouth daily. For htn   bisoprolol-hydrochlorothiazide (ZIAC) 2.5-6.25 MG tablet TAKE 1 TABLET BY MOUTH EVERY DAY   ciprofloxacin (CIPRO) 500 MG tablet Take 1 tablet (500 mg total) by mouth 2 (two) times daily for 10 days.   oxyCODONE (OXY IR/ROXICODONE) 5 MG immediate release tablet Take 1/2 tab by mouth qd as needed for severe pain   pantoprazole (PROTONIX) 40 MG tablet Take 1 tablet (40 mg total) by mouth daily.   simvastatin (ZOCOR) 20 MG tablet Take by mouth.   traMADol-acetaminophen (ULTRACET) 37.5-325 MG tablet Take 1 tablet by mouth 2 (two) times daily as needed.   triamcinolone (KENALOG) 0.025 % cream Apply 1 application topically 2 (two) times daily.   zolpidem (AMBIEN) 10 MG tablet Take 1 tablet (10 mg total) by mouth at bedtime.   [DISCONTINUED] ALPRAZolam (XANAX) 0.25 MG tablet Take 1 tablet (0.25 mg total) by mouth daily as needed for anxiety. for anxiety   [DISCONTINUED] levothyroxine (SYNTHROID) 50 MCG tablet TAKE 1 TABLET BY MOUTH EVERY MORNING 30 MINUTES PRIOR TO EATING FOOD   [DISCONTINUED] oxycodone (OXY-IR) 5 MG capsule Take one tab po qd prn for flank pain  ALPRAZolam (XANAX) 0.25 MG tablet Take 1 tablet (0.25 mg total) by mouth daily as needed for anxiety. for anxiety   levothyroxine (SYNTHROID) 50 MCG tablet TAKE 1 TABLET BY MOUTH EVERY MORNING 30 MINUTES PRIOR TO EATING FOOD   meloxicam (MOBIC) 15 MG tablet Take 1/2 tab po daily (Patient not taking: Reported on 04/21/2021)   mupirocin ointment (BACTROBAN) 2 % Apply 1 application topically daily. With dressing changes (Patient not taking: Reported on 04/21/2021)   valACYclovir (VALTREX) 500 MG tablet Take 1 tablet (500 mg total) by mouth 2 (two)  times daily. (Patient not taking: Reported on 04/21/2021)   [DISCONTINUED] oxycodone (OXY-IR) 5 MG capsule Take 1/2 tab po qd prn for flank pain   No facility-administered encounter medications on file as of 04/21/2021.    Surgical History: Past Surgical History:  Procedure Laterality Date   aortic aneurysm surgery     COLONOSCOPY     COLONOSCOPY WITH PROPOFOL N/A 06/14/2016   Procedure: COLONOSCOPY WITH PROPOFOL;  Surgeon: Lucilla Lame, MD;  Location: [place];  Service: Endoscopy;  Laterality: N/A;   ESOPHAGOGASTRODUODENOSCOPY (EGD) WITH PROPOFOL N/A 06/14/2016   Procedure: ESOPHAGOGASTRODUODENOSCOPY (EGD) WITH PROPOFOL;  Surgeon: Lucilla Lame, MD;  Location: [place];  Service: Endoscopy;  Laterality: N/A;   TUBAL LIGATION      Medical History: Past Medical History:  Diagnosis Date   Actinic keratosis 03/28/2021   right upper arm, bx proven   Anxiety    Aortic aneurysm (HCC)    Carpal tunnel syndrome 2019   Depression    Emphysema of lung ([place])    "mild"   GERD (gastroesophageal reflux disease)    Headache    daily - stress   Herpes    Hyperlipidemia    Insomnia    Osteoporosis    SCC (squamous cell carcinoma) 03/28/2021   left dorsal hand, EDC   Vertigo    1 episode - approx 2010    Weakness of both legs    intermittent    Family History: Family History  Problem Relation Age of Onset   Leukemia Mother    Aneurysm Other     Social History   Socioeconomic History   Marital status: Divorced    Spouse name: Not on file   Number of children: Not on file   Years of education: Not on file   Highest education level: Not on file  Occupational History   Not on file  Tobacco Use   Smoking status: Every Day    Packs/day: 1.00    Years: 30.00    Pack years: 30.00    Types: Cigarettes   Smokeless tobacco: Never  Substance and Sexual Activity   Alcohol use: No   Drug use: Never   Sexual activity: Not on file  Other Topics Concern   Not on  file  Social History Narrative   Not on file   Social Determinants of Health   Financial Resource Strain: Not on file  Food Insecurity: Not on file  Transportation Needs: Not on file  Physical Activity: Not on file  Stress: Not on file  Social Connections: Not on file  Intimate Partner Violence: Not on file      Review of Systems  Constitutional:  Negative for chills, fatigue and unexpected weight change.  HENT:  Negative for congestion, postnasal drip, rhinorrhea, sneezing and sore throat.   Eyes:  Negative for redness.  Respiratory:  Negative for cough, chest tightness and shortness of breath.   Cardiovascular:  Negative for chest pain and palpitations.  Gastrointestinal:  Positive for abdominal pain. Negative for constipation, diarrhea, nausea and vomiting.  Genitourinary:  Positive for dyspareunia, pelvic pain and vaginal discharge. Negative for dysuria and frequency.  Musculoskeletal:  Negative for arthralgias, back pain, joint swelling and neck pain.  Skin:  Negative for rash.  Neurological: Negative.  Negative for tremors and numbness.  Hematological:  Negative for adenopathy. Does not bruise/bleed easily.  Psychiatric/Behavioral:  Negative for behavioral problems (Depression), sleep disturbance and suicidal ideas. The patient is nervous/anxious.    Vital Signs: BP 134/78 Comment: 142/78  Pulse 75   Temp 98.4 F (36.9 C)   Resp 16   Ht 5\' 7"  (1.702 m)   Wt 132 lb 3.2 oz (60 kg)   SpO2 98%   BMI 20.71 kg/m    Physical Exam Vitals and nursing note reviewed.  Constitutional:      General: She is not in acute distress.    Appearance: She is well-developed. She is not diaphoretic.  HENT:     Head: Normocephalic and atraumatic.     Mouth/Throat:     Pharynx: No oropharyngeal exudate.  Eyes:     Pupils: Pupils are equal, round, and reactive to light.  Neck:     Thyroid: No thyromegaly.     Vascular: No JVD.     Trachea: No tracheal deviation.   Cardiovascular:     Rate and Rhythm: Normal rate and regular rhythm.     Heart sounds: Normal heart sounds. No murmur heard.   No friction rub. No gallop.  Pulmonary:     Effort: Pulmonary effort is normal. No respiratory distress.     Breath sounds: No wheezing or rales.  Chest:     Chest wall: No tenderness.  Abdominal:     General: Bowel sounds are normal.     Palpations: Abdomen is soft.     Tenderness: There is abdominal tenderness.  Musculoskeletal:        General: Normal range of motion.     Cervical back: Normal range of motion and neck supple.  Lymphadenopathy:     Cervical: No cervical adenopathy.  Skin:    General: Skin is warm and dry.  Neurological:     Mental Status: She is alert and oriented to person, place, and time.     Cranial Nerves: No cranial nerve deficit.  Psychiatric:        Behavior: Behavior normal.        Thought Content: Thought content normal.        Judgment: Judgment normal.       Assessment/Plan: 1. Pelvic pain in female Will refer to OBGYN for further eval due to ongoing pain and cysts in ovary and uterus. May take 1/2 tab oxycodone as needed - Ambulatory referral to Obstetrics / Gynecology - oxyCODONE (OXY IR/ROXICODONE) 5 MG immediate release tablet; Take 1/2 tab by mouth qd as needed for severe pain  Dispense: 15 tablet; Refill: 0  2. Cyst of right ovary Refer to OBGYN - Ambulatory referral to Obstetrics / Gynecology  3. Uterine cyst Refer to OBGYN due to cyst and ongoing pelvic pain - Ambulatory referral to Obstetrics / Gynecology  4. Acquired hypothyroidism Continue synthroid - levothyroxine (SYNTHROID) 50 MCG tablet; TAKE 1 TABLET BY MOUTH EVERY MORNING 30 MINUTES PRIOR TO EATING FOOD  Dispense: 90 tablet; Refill: 1  5. Essential hypertension Stable, continue current therapy  6. Aortic atherosclerosis (HCC) Continue statin, s/p AAA repair followed by vascular  7. Generalized anxiety disorder May continue xanax as  needed but understands she should not take at same time as oxycodone - ALPRAZolam (XANAX) 0.25 MG tablet; Take 1 tablet (0.25 mg total) by mouth daily as needed for anxiety. for anxiety  Dispense: 30 tablet; Refill: 1 [place] Controlled Substance Database was reviewed by me for overdose risk score (ORS) Reviewed risks and possible side effects associated with taking opiates, benzodiazepines and other CNS depressants. Combination of these could cause dizziness and drowsiness. Advised patient not to drive or operate machinery when taking these medications, as patient's and other's life can be at risk and will have consequences. Patient verbalized understanding in this matter. Dependence and abuse for these drugs will be monitored closely. A Controlled substance policy and procedure is on file which allows [facility] to order a urine drug screen test at any visit. Patient understands and agrees with the plan  8. Screening for STDs (sexually transmitted diseases) Will test for STDs based on discharge--may need pap/pelvic swab - Chlamydia/Gonococcus/Trichomonas, NAA  9. Dysuria Signs of UTI on urine--start cipro and will adjust as indicated by C/S - ciprofloxacin (CIPRO) 500 MG tablet; Take 1 tablet (500 mg total) by mouth 2 (two) times daily for 10 days.  Dispense: 20 tablet; Refill: 0 - POCT Urinalysis Dipstick - CULTURE, URINE COMPREHENSIVE   General Counseling: chesley valls understanding of the findings of todays visit and agrees with plan of treatment. I have discussed any further diagnostic evaluation that may be needed or ordered today. We also reviewed her medications today. she has been encouraged to call the office with any questions or concerns that should arise related to todays visit.    Orders Placed This Encounter  Procedures   CULTURE, URINE COMPREHENSIVE   Chlamydia/Gonococcus/Trichomonas, NAA   Ambulatory referral to Obstetrics / Gynecology   POCT Urinalysis  Dipstick    Meds ordered this encounter  Medications   levothyroxine (SYNTHROID) 50 MCG tablet    Sig: TAKE 1 TABLET BY MOUTH EVERY MORNING 30 MINUTES PRIOR TO EATING FOOD    Dispense:  90 tablet    Refill:  1   ALPRAZolam (XANAX) 0.25 MG tablet    Sig: Take 1 tablet (0.25 mg total) by mouth daily as needed for anxiety. for anxiety    Dispense:  30 tablet    Refill:  1   DISCONTD: oxycodone (OXY-IR) 5 MG capsule    Sig: Take 1/2 tab po qd prn for flank pain    Dispense:  15 capsule    Refill:  0   ciprofloxacin (CIPRO) 500 MG tablet    Sig: Take 1 tablet (500 mg total) by mouth 2 (two) times daily for 10 days.    Dispense:  20 tablet    Refill:  0   oxyCODONE (OXY IR/ROXICODONE) 5 MG immediate release tablet    Sig: Take 1/2 tab by mouth qd as needed for severe pain    Dispense:  15 tablet    Refill:  0    This patient was seen by Drema Dallas, PA-C in collaboration with Dr. Clayborn Bigness as a part of collaborative care agreement.   Total time spent:40 Minutes Time spent includes review of chart, medications, test results, and follow up plan with the patient.      Dr Lavera Guise Internal medicine

## 2021-04-24 LAB — CULTURE, URINE COMPREHENSIVE

## 2021-04-25 LAB — CHLAMYDIA/GONOCOCCUS/TRICHOMONAS, NAA
Chlamydia by NAA: NEGATIVE
Gonococcus by NAA: NEGATIVE
Trich vag by NAA: NEGATIVE

## 2021-05-02 ENCOUNTER — Encounter: Payer: Self-pay | Admitting: Dermatology

## 2021-05-11 ENCOUNTER — Ambulatory Visit: Payer: Medicare HMO | Admitting: Physician Assistant

## 2021-05-15 ENCOUNTER — Ambulatory Visit: Payer: Medicare HMO | Admitting: Obstetrics and Gynecology

## 2021-05-15 ENCOUNTER — Other Ambulatory Visit: Payer: Self-pay

## 2021-05-15 ENCOUNTER — Other Ambulatory Visit (HOSPITAL_COMMUNITY)
Admission: RE | Admit: 2021-05-15 | Discharge: 2021-05-15 | Disposition: A | Discharge status: Home / Self Care | Payer: Medicare HMO | Source: Ambulatory Visit | Attending: Obstetrics and Gynecology | Admitting: Obstetrics and Gynecology

## 2021-05-15 ENCOUNTER — Encounter: Payer: Self-pay | Admitting: Obstetrics and Gynecology

## 2021-05-15 VITALS — BP 132/86 | Ht 67.0 in | Wt 132.0 lb

## 2021-05-15 DIAGNOSIS — R935 Abnormal findings on diagnostic imaging of other abdominal regions, including retroperitoneum: Secondary | ICD-10-CM | POA: Diagnosis not present

## 2021-05-15 DIAGNOSIS — N930 Postcoital and contact bleeding: Secondary | ICD-10-CM | POA: Diagnosis not present

## 2021-05-15 DIAGNOSIS — Z113 Encounter for screening for infections with a predominantly sexual mode of transmission: Secondary | ICD-10-CM | POA: Diagnosis not present

## 2021-05-15 DIAGNOSIS — Z1151 Encounter for screening for human papillomavirus (HPV): Secondary | ICD-10-CM | POA: Diagnosis not present

## 2021-05-15 DIAGNOSIS — N898 Other specified noninflammatory disorders of vagina: Secondary | ICD-10-CM | POA: Diagnosis not present

## 2021-05-15 DIAGNOSIS — Z124 Encounter for screening for malignant neoplasm of cervix: Secondary | ICD-10-CM | POA: Diagnosis not present

## 2021-05-15 DIAGNOSIS — Z01419 Encounter for gynecological examination (general) (routine) without abnormal findings: Secondary | ICD-10-CM | POA: Diagnosis not present

## 2021-05-15 NOTE — Progress Notes (Signed)
Obstetrics & Gynecology Office Visit   Chief Complaint:  Chief Complaint  Patient presents with   Pelvic Pain    Referral [place] ovarian cyst, white/pinkish discharge. RM 5    History of Present Illness: The patient is a 70 y.o. female presenting for consultation at the request of  [place]   concerning a recently imaged right adnexal cyst.  Initial presentation was prompted by pelvic pain.  Previous transvaginal ultrasound imaging 04/12/2021 at [facility]  demonstrated dimensions of 1.3 x 0.9 x 1.2cm.  Appearance was notable complex appearance and no free fluid. The patient endorses associated symptoms of abdominal pain and pelvic pain.  The patient denies associated symptoms of  early satiety, weight gain, weight loss, night sweats, constipation, diarrhea, nausea, and emesis.  There is not a notable family history of ovarian cancer, uterine cancer, breast cancer, or colon cancer.  There is mention of a fundal 3.2 x 1.8 x 5.0cm ellipitical cystic mass not further described.  Patient has had prior CT abdomen and pelvis 12/30/2020 which showed no evidence of any uterine or ovarian masses/cysts.  The patient has had a prior essure procedure.  No PMB.  No mention is made of doppler flow on this uterine mass or ovarian cyst  Review of Systems: 10 point review of systems negative unless otherwise noted in HPI  Past Medical History:  Patient Active Problem List   Diagnosis Date Noted   Encounter for screening mammogram for malignant neoplasm of breast 09/02/2020   Hernia, hiatal 06/15/2020   Thoracoabdominal aortic aneurysm (TAAA) without rupture ([place]) 04/19/2020   Urinary tract infection with hematuria 01/23/2020   Abdominal pain 01/23/2020   Acquired hypothyroidism 10/11/2019   Need for vaccination against Streptococcus pneumoniae using pneumococcal conjugate vaccine 13 10/11/2019   Laceration of right lower extremity 10/11/2019   Right upper quadrant pain 06/17/2019    Gallbladder sludge 06/17/2019   Other specified diseases of gallbladder 06/17/2019   Gastroesophageal reflux disease without esophagitis 04/30/2019   Vitamin D deficiency 04/30/2019   Other fatigue 04/30/2019   Primary generalized (osteo)arthritis 12/26/2018   Atopic dermatitis 12/26/2018   Acute upper respiratory infection 08/27/2018   Carpal tunnel syndrome on both sides 08/27/2018   Chronic idiopathic constipation 05/18/2018   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/2018   Dysuria 05/18/2018   Left sided abdominal pain 01/04/2018   Primary insomnia 01/04/2018   Generalized anxiety disorder 01/04/2018   Tobacco use disorder 07/31/2016   Hyperlipidemia 07/31/2016   Atherosclerosis of native arteries of extremity with intermittent claudication ([place]) 07/31/2016   Abdominal aortic aneurysm (AAA) without rupture ([place]) 07/31/2016   Abdominal pain, epigastric    Abnormal findings-gastrointestinal tract    Gastritis    Other specified diseases of esophagus    Encounter for long-term (current) use of medications     Past Surgical History:  Past Surgical History:  Procedure Laterality Date   aortic aneurysm surgery     COLONOSCOPY     COLONOSCOPY WITH PROPOFOL N/A 06/14/2016   Procedure: COLONOSCOPY WITH PROPOFOL;  Surgeon: Lucilla Lame, MD;  Location: [facility];  Service: Endoscopy;  Laterality: N/A;   ESOPHAGOGASTRODUODENOSCOPY (EGD) WITH PROPOFOL N/A 06/14/2016   Procedure: ESOPHAGOGASTRODUODENOSCOPY (EGD) WITH PROPOFOL;  Surgeon: Lucilla Lame, MD;  Location: [facility];  Service: Endoscopy;  Laterality: N/A;   TUBAL LIGATION      Gynecologic History: No LMP recorded. Patient is postmenopausal.  Obstetric History: G2P0  Family History:  Family History  Problem Relation Age  of Onset   Leukemia Mother    Aneurysm Other     Social History:  Social History   Socioeconomic History   Marital status: Divorced    Spouse name: Not on file   Number of children: Not on file    Years of education: Not on file   Highest education level: Not on file  Occupational History   Not on file  Tobacco Use   Smoking status: Every Day    Packs/day: 1.00    Years: 30.00    Pack years: 30.00    Types: Cigarettes   Smokeless tobacco: Never  Vaping Use   Vaping Use: Not on file  Substance and Sexual Activity   Alcohol use: No   Drug use: Never   Sexual activity: Not Currently    Birth control/protection: Post-menopausal  Other Topics Concern   Not on file  Social History Narrative   Not on file   Social Determinants of Health   Financial Resource Strain: Not on file  Food Insecurity: Not on file  Transportation Needs: Not on file  Physical Activity: Not on file  Stress: Not on file  Social Connections: Not on file  Intimate Partner Violence: Not on file    Allergies:  No Known Allergies  Medications: Prior to Admission medications   Medication Sig Start Date End Date Taking? Authorizing Provider  ALPRAZolam (XANAX) 0.25 MG tablet Take 1 tablet (0.25 mg total) by mouth daily as needed for anxiety. for anxiety 04/21/21  Yes McDonough, Lauren K, PA-C  amLODipine (NORVASC) 2.5 MG tablet Take 1 tablet (2.5 mg total) by mouth daily. For htn 03/08/21  Yes Lavera Guise, MD  aspirin 81 MG chewable tablet Chew by mouth. 11/21/20  Yes [provider]  levothyroxine (SYNTHROID) 50 MCG tablet TAKE 1 TABLET BY MOUTH EVERY MORNING 30 MINUTES PRIOR TO EATING FOOD 04/21/21  Yes McDonough, Lauren K, PA-C  oxyCODONE (OXY IR/ROXICODONE) 5 MG immediate release tablet Take 1/2 tab by mouth qd as needed for severe pain 04/21/21  Yes McDonough, Lauren K, PA-C  pantoprazole (PROTONIX) 40 MG tablet Take 1 tablet (40 mg total) by mouth daily. 10/14/20  Yes Lavera Guise, MD  simvastatin (ZOCOR) 20 MG tablet Take by mouth. 12/05/20  Yes [provider]  traMADol-acetaminophen (ULTRACET) 37.5-325 MG tablet Take 1 tablet by mouth 2 (two) times daily as needed. 12/29/20  Yes  Luiz Ochoa, NP  zolpidem (AMBIEN) 10 MG tablet Take 1 tablet (10 mg total) by mouth at bedtime. 12/29/20  Yes Luiz Ochoa, NP  bisoprolol-hydrochlorothiazide [facility]) 2.5-6.25 MG tablet TAKE 1 TABLET BY MOUTH EVERY DAY Patient not taking: Reported on 05/15/2021 04/05/21   Lavera Guise, MD  meloxicam [facility]) 15 MG tablet Take 1/2 tab po daily Patient not taking: Reported on 04/21/2021 02/02/19   Ronnell Freshwater, NP  mupirocin ointment (BACTROBAN) 2 % Apply 1 application topically daily. With dressing changes Patient not taking: Reported on 04/21/2021 03/28/21   Laurence Ferrari, [place], MD  valACYclovir (VALTREX) 500 MG tablet Take 1 tablet (500 mg total) by mouth 2 (two) times daily. Patient not taking: Reported on 04/21/2021 08/01/18   Ronnell Freshwater, NP    Physical Exam Vitals:  Vitals:   05/15/21 1439  BP: 132/86   No LMP recorded. Patient is postmenopausal.  General: NAD HEENT: normocephalic, anicteric Pulmonary: No increased work of breathing Abdomen: NABS, soft, non-tender, non-distended.  Umbilicus without lesions.  No hepatomegaly, splenomegaly or masses palpable. No evidence  of hernia  Genitourinary:  External: Normal external female genitalia.  Normal urethral meatus, normal Bartholin's and Skene's glands.    Vagina: Normal vaginal mucosa, no evidence of prolapse.    Cervix: Grossly normal in appearance, no bleeding  Uterus: Non-enlarged, mobile, normal contour.  No CMT  Adnexa: ovaries non-enlarged, no adnexal masses  Rectal: deferred  Lymphatic: no evidence of inguinal lymphadenopathy Extremities: no edema, erythema, or tenderness Neurologic: Grossly intact Psychiatric: mood appropriate, affect full  Female chaperone present for pelvic and breast  portions of the physical exam  No results found.  Assessment: 70 y.o. G2P0 presenting for with pelvic pain no clear gyn etiology but mention made of uterine mass/cyst unclear etiolgogy  Plan: Problem List Items Addressed  This Visit   None Visit Diagnoses     Abnormal endometrial ultrasound    -  Primary   Relevant Orders   US PELVIS TRANSVAGINAL NON-OB (TV ONLY)   Vaginal discharge       Relevant Orders   Cytology - PAP (Completed)   Postcoital and contact bleeding       Screening for malignant neoplasm of cervix       Relevant Orders   Cytology - PAP (Completed)   Routine screening for STI (sexually transmitted infection)       Relevant Orders   Cytology - PAP (Completed)       1) POSTMENOPAUSAL The incidence and implication of adnexal masses and ovarian cysts were discussed with the patient in detail.  Prior imaging if available was reviewed at today's visit.  While adnexal masses and cysts are a less common imaging finding in postmenopausal women as compared to premenopausal women, the vast majority of these lesions are still benign.  Follow up imaging to determine stability in size and appearance is reasonable in order to provide additional reassurance.  In some cases symptoms, family history, or indeterminate or concerning findings may warrant surgical evaluation and referral to a Gynecology-Oncologist., or serum tumor markers.  Based on size and description no further work up required for the ovarian cyst imaged.  1. Cysts ?1 cm: Are clinically inconsequential; at the discretion of the interpreting physician whether or not to describe them in the imaging report; do not need follow-up. 2. Cysts >1 and ?7 cm: Should be described in the imaging report with statement that they are almost certainly benign; yearly follow-up, at least initially, with [place] recommended. Some practices may opt to increase the lower size threshold for follow-up from 1 cm to as high as 3 cm. One may opt to continue follow-up annually or to decrease the frequency of follow-up once stability or decrease in size has been confirmed. Cysts in the larger end of this range should still generally be followed on a regular basis. 3. Cysts >7  cm: Since these may be difficult to assess completely with [place], further imaging with MR or surgical evaluation should be considered.  Gordy Levan et al. Management of Asymptomatic Ovarian and Other Adnexal Cysts Imaged at [place]: Society of Radiologists in [place] Statement 2010. Radiology 256 (Sept 2010): [id number].).   2) Uterine cyst/mass - unclear etiology based on description and not present on prior CT scan earlier this year.  Unable to review images.  Will obtain repeat ultrasound so able to review images. The endometrium measured non-thickened at 0.29cm.  We discussed that some fluid in the setting of cervical stenosis is non-indicative in a postmenopausal patient in the absence of uterine bleeding.    3)  Health care maintenance - pap brought uptodate  4) Return after hospital ultrasound.    Malachy Mood, MD, [facility], [facility] 05/15/2021, 3:33 PM

## 2021-05-19 LAB — CYTOLOGY - PAP
Chlamydia: NEGATIVE
Comment: NEGATIVE
Comment: NEGATIVE
Comment: NEGATIVE
Comment: NORMAL
Diagnosis: NEGATIVE
Diagnosis: REACTIVE
High risk HPV: NEGATIVE
Neisseria Gonorrhea: NEGATIVE
Trichomonas: NEGATIVE

## 2021-05-26 ENCOUNTER — Telehealth: Payer: Self-pay | Admitting: Internal Medicine

## 2021-05-26 NOTE — Chronic Care Management (AMB) (Signed)
  Chronic Care Management   Note  05/26/2021 Name: Joy Carpenter MRN: [id number] DOB: [birth year]  [name] is a 70 y.o. year old female who is a primary care patient of Lavera Guise, MD. I reached out to Diona Fanti by phone today in response to a referral sent by [name] PCP, Lavera Guise, MD.   [name] was given information about Chronic Care Management services today including:  CCM service includes personalized support from designated clinical staff supervised by her physician, including individualized plan of care and coordination with other care providers 24/7 contact phone numbers for assistance for urgent and routine care needs. Service will only be billed when office clinical staff spend 20 minutes or more in a month to coordinate care. Only one practitioner may furnish and bill the service in a calendar month. The patient may stop CCM services at any time (effective at the end of the month) by phone call to the office staff.   Patient agreed to services and verbal consent obtained.   Follow up plan:   Tatjana Secretary/administrator

## 2021-06-01 ENCOUNTER — Other Ambulatory Visit: Payer: Self-pay | Admitting: Nurse Practitioner

## 2021-06-01 DIAGNOSIS — I700 Atherosclerosis of aorta: Secondary | ICD-10-CM

## 2021-06-02 ENCOUNTER — Ambulatory Visit: Payer: Medicare HMO

## 2021-06-05 ENCOUNTER — Ambulatory Visit (INDEPENDENT_AMBULATORY_CARE_PROVIDER_SITE_OTHER): Payer: Medicare HMO | Admitting: Physician Assistant

## 2021-06-05 ENCOUNTER — Ambulatory Visit: Payer: Medicare HMO | Admitting: Obstetrics and Gynecology

## 2021-06-05 ENCOUNTER — Encounter: Payer: Self-pay | Admitting: Physician Assistant

## 2021-06-05 ENCOUNTER — Other Ambulatory Visit: Payer: Self-pay

## 2021-06-05 VITALS — BP 140/70 | HR 76 | Temp 97.8°F | Resp 16 | Ht 67.0 in | Wt 131.0 lb

## 2021-06-05 DIAGNOSIS — R1032 Left lower quadrant pain: Secondary | ICD-10-CM

## 2021-06-05 DIAGNOSIS — J0100 Acute maxillary sinusitis, unspecified: Secondary | ICD-10-CM | POA: Diagnosis not present

## 2021-06-05 DIAGNOSIS — N858 Other specified noninflammatory disorders of uterus: Secondary | ICD-10-CM | POA: Diagnosis not present

## 2021-06-05 DIAGNOSIS — I714 Abdominal aortic aneurysm, without rupture, unspecified: Secondary | ICD-10-CM

## 2021-06-05 DIAGNOSIS — K449 Diaphragmatic hernia without obstruction or gangrene: Secondary | ICD-10-CM

## 2021-06-05 MED ORDER — AMOXICILLIN-POT CLAVULANATE 875-125 MG PO TABS: 1.0000 | ORAL_TABLET | Freq: Two times a day (BID) | ORAL | 0 refills | Status: DC

## 2021-06-05 MED ORDER — TRAMADOL-ACETAMINOPHEN 37.5-325 MG PO TABS: 1.0000 | ORAL_TABLET | Freq: Two times a day (BID) | ORAL | 1 refills | Status: DC | PRN

## 2021-06-05 NOTE — Progress Notes (Signed)
[facility] [place], [place] [address]  Internal MEDICINE  Office Visit Note  Patient Name: Joy Carpenter  [id number]  [id number]  Date of Service: 06/11/2021  Chief Complaint  Patient presents with   Acute Visit    Home covid test negative 06/04/21    Cough    Pt was positive for covid on 05/25/21 took only OTC    Sinusitis   Ear Pain    left     HPI Pt is here for a sick visit. -Tested positive for covid on 05/25/21. Did Mucinex, nyquil and aspirin. Continue to have sinus congestion and cough -OBGYN ordered new [place] on 9/1 to further investigate. Pap was normal, but pt continues to have d/c and pain -LLQ pain, unclear if related to graft, next vascular f/u in 3 months--last visit vascular surgeon did not think pain could be related.  -Pt requests pain med refill--discussed we can refill tramadol-acetaminophen, but not oxycodon  Current Medication:  Outpatient Encounter Medications as of 06/05/2021  Medication Sig Note   amoxicillin-clavulanate (AUGMENTIN) 875-125 MG tablet Take 1 tablet by mouth 2 (two) times daily. Take with food.    ALPRAZolam (XANAX) 0.25 MG tablet Take 1 tablet (0.25 mg total) by mouth daily as needed for anxiety. for anxiety    amLODipine (NORVASC) 2.5 MG tablet Take 1 tablet (2.5 mg total) by mouth daily. For htn    aspirin 81 MG chewable tablet Chew by mouth.    bisoprolol-hydrochlorothiazide (ZIAC) 2.5-6.25 MG tablet TAKE 1 TABLET BY MOUTH EVERY DAY (Patient not taking: Reported on 05/15/2021)    levothyroxine (SYNTHROID) 50 MCG tablet TAKE 1 TABLET BY MOUTH EVERY MORNING 30 MINUTES PRIOR TO EATING FOOD    meloxicam (MOBIC) 15 MG tablet Take 1/2 tab po daily (Patient not taking: Reported on 04/21/2021)    mupirocin ointment (BACTROBAN) 2 % Apply 1 application topically daily. With dressing changes (Patient not taking: Reported on 04/21/2021)    pantoprazole (PROTONIX) 40 MG tablet Take 1 tablet (40 mg total) by mouth daily.     simvastatin (ZOCOR) 20 MG tablet Take by mouth.    traMADol-acetaminophen (ULTRACET) 37.5-325 MG tablet Take 1 tablet by mouth 2 (two) times daily as needed.    valACYclovir (VALTREX) 500 MG tablet Take 1 tablet (500 mg total) by mouth 2 (two) times daily. (Patient not taking: Reported on 04/21/2021) 05/15/2021: As needed   zolpidem (AMBIEN) 10 MG tablet Take 1 tablet (10 mg total) by mouth at bedtime.    [DISCONTINUED] oxyCODONE (OXY IR/ROXICODONE) 5 MG immediate release tablet Take 1/2 tab by mouth qd as needed for severe pain    [DISCONTINUED] traMADol-acetaminophen (ULTRACET) 37.5-325 MG tablet Take 1 tablet by mouth 2 (two) times daily as needed.    No facility-administered encounter medications on file as of 06/05/2021.      Medical History: Past Medical History:  Diagnosis Date   Actinic keratosis 03/28/2021   right upper arm, bx proven   Anxiety    Aortic aneurysm (HCC)    Carpal tunnel syndrome 2019   Depression    Emphysema of lung ([place])    "mild"   GERD (gastroesophageal reflux disease)    Headache    daily - stress   Herpes    Hyperlipidemia    Insomnia    Osteoporosis    SCC (squamous cell carcinoma) 03/28/2021   left dorsal hand, EDC   Vertigo    1 episode - approx 2010    Weakness of  both legs    intermittent     Vital Signs: BP 140/70   Pulse 76   Temp 97.8 F (36.6 C)   Resp 16   Ht '5\' 7"'$  (1.702 m)   Wt 131 lb (59.4 kg)   SpO2 96%   BMI 20.52 kg/m    Review of Systems  Constitutional:  Negative for fatigue and fever.  HENT:  Positive for congestion and sinus pressure. Negative for mouth sores and postnasal drip.   Respiratory:  Positive for cough.   Cardiovascular:  Negative for chest pain.  Gastrointestinal:  Positive for abdominal pain.  Genitourinary:  Positive for pelvic pain and vaginal discharge. Negative for flank pain.  Psychiatric/Behavioral: Negative.     Physical Exam Vitals and nursing note reviewed.  Constitutional:       General: She is not in acute distress.    Appearance: She is well-developed. She is not diaphoretic.  HENT:     Head: Normocephalic and atraumatic.     Nose: Congestion present.     Mouth/Throat:     Pharynx: No oropharyngeal exudate.  Eyes:     Pupils: Pupils are equal, round, and reactive to light.  Neck:     Thyroid: No thyromegaly.     Vascular: No JVD.     Trachea: No tracheal deviation.  Cardiovascular:     Rate and Rhythm: Normal rate and regular rhythm.     Heart sounds: Normal heart sounds. No murmur heard.   No friction rub. No gallop.  Pulmonary:     Effort: Pulmonary effort is normal. No respiratory distress.     Breath sounds: No wheezing or rales.  Chest:     Chest wall: No tenderness.  Abdominal:     General: Bowel sounds are normal.     Palpations: Abdomen is soft.  Musculoskeletal:        General: Normal range of motion.     Cervical back: Normal range of motion and neck supple.  Lymphadenopathy:     Cervical: No cervical adenopathy.  Skin:    General: Skin is warm and dry.  Neurological:     Mental Status: She is alert and oriented to person, place, and time.     Cranial Nerves: No cranial nerve deficit.  Psychiatric:        Behavior: Behavior normal.        Thought Content: Thought content normal.        Judgment: Judgment normal.      Assessment/Plan: 1. Acute non-recurrent maxillary sinusitis Will start Augmentin twice a day with food.  Patient may continue Mucinex and add nasal spray. - amoxicillin-clavulanate (AUGMENTIN) 875-125 MG tablet; Take 1 tablet by mouth 2 (two) times daily. Take with food.  Dispense: 20 tablet; Refill: 0  2. Abdominal aortic aneurysm (AAA) without rupture [facility]) May take Ultracet as needed for acute pain.  Patient is status post AAA repair and follows with vascular - traMADol-acetaminophen (ULTRACET) 37.5-325 MG tablet; Take 1 tablet by mouth 2 (two) times daily as needed.  Dispense: 60 tablet; Refill: 1  3. LLQ  pain May take Ultracet as needed for pain.  Patient plans to follow up with vascular to ensure pain isnt due to graft, as well as has follow-up with OB/GYN for further ultrasound due to small cysts found in ovary and uterus on prior imaging  4. Uterine cyst Followed by OB/GYN   General Counseling: Bubba Camp understanding of the findings of todays visit and agrees with plan of  treatment. I have discussed any further diagnostic evaluation that may be needed or ordered today. We also reviewed her medications today. she has been encouraged to call the office with any questions or concerns that should arise related to todays visit.    Counseling:    No orders of the defined types were placed in this encounter.   Meds ordered this encounter  Medications   amoxicillin-clavulanate (AUGMENTIN) 875-125 MG tablet    Sig: Take 1 tablet by mouth 2 (two) times daily. Take with food.    Dispense:  20 tablet    Refill:  0   traMADol-acetaminophen (ULTRACET) 37.5-325 MG tablet    Sig: Take 1 tablet by mouth 2 (two) times daily as needed.    Dispense:  60 tablet    Refill:  1    Patient has good RX coupon to use for this prescription.    Time spent:30 Minutes

## 2021-06-06 ENCOUNTER — Ambulatory Visit: Payer: Medicare HMO

## 2021-06-08 ENCOUNTER — Ambulatory Visit
Admission: RE | Admit: 2021-06-08 | Discharge: 2021-06-08 | Disposition: A | Discharge status: Home / Self Care | Payer: Medicare HMO | Source: Ambulatory Visit | Attending: Obstetrics and Gynecology | Admitting: Obstetrics and Gynecology

## 2021-06-08 ENCOUNTER — Other Ambulatory Visit: Payer: Self-pay

## 2021-06-08 DIAGNOSIS — R935 Abnormal findings on diagnostic imaging of other abdominal regions, including retroperitoneum: Secondary | ICD-10-CM | POA: Diagnosis not present

## 2021-06-08 DIAGNOSIS — N858 Other specified noninflammatory disorders of uterus: Secondary | ICD-10-CM | POA: Diagnosis not present

## 2021-06-08 DIAGNOSIS — Z780 Asymptomatic menopausal state: Secondary | ICD-10-CM | POA: Diagnosis not present

## 2021-06-08 DIAGNOSIS — R102 Pelvic and perineal pain: Secondary | ICD-10-CM | POA: Diagnosis not present

## 2021-06-13 ENCOUNTER — Other Ambulatory Visit: Payer: Self-pay

## 2021-06-13 ENCOUNTER — Ambulatory Visit: Payer: Medicare HMO | Admitting: Obstetrics and Gynecology

## 2021-06-13 ENCOUNTER — Encounter: Payer: Self-pay | Admitting: Obstetrics and Gynecology

## 2021-06-13 VITALS — BP 136/72 | Ht 67.0 in | Wt 132.0 lb

## 2021-06-13 DIAGNOSIS — N882 Stricture and stenosis of cervix uteri: Secondary | ICD-10-CM

## 2021-06-13 DIAGNOSIS — R102 Pelvic and perineal pain: Secondary | ICD-10-CM | POA: Diagnosis not present

## 2021-06-13 DIAGNOSIS — R935 Abnormal findings on diagnostic imaging of other abdominal regions, including retroperitoneum: Secondary | ICD-10-CM | POA: Diagnosis not present

## 2021-06-13 NOTE — Progress Notes (Signed)
Gynecology Ultrasound Follow Up  Chief Complaint:  Chief Complaint  Patient presents with   Follow-up    Ultrasound [facility]) - RM 5     History of Present Illness: Patient is a 70 y.o. female who presents today for ultrasound evaluation of pelvic pain .  Ultrasound demonstrates the following findgins Adnexa: no adnexal masses Uterus: Non-enlarged with endometrial stripe  complex heterogenous and thickened Additional: no free fluid  Review of Systems: Review of Systems  Constitutional: Negative.   Gastrointestinal:  Positive for abdominal pain. Negative for blood in stool, constipation, diarrhea, nausea and vomiting.  Genitourinary: Negative.    Past Medical History:  Past Medical History:  Diagnosis Date   Actinic keratosis 03/28/2021   right upper arm, bx proven   Anxiety    Aortic aneurysm (HCC)    Carpal tunnel syndrome 2019   Depression    Emphysema of lung ([place])    "mild"   GERD (gastroesophageal reflux disease)    Headache    daily - stress   Herpes    Hyperlipidemia    Insomnia    Osteoporosis    SCC (squamous cell carcinoma) 03/28/2021   left dorsal hand, EDC   Vertigo    1 episode - approx 2010    Weakness of both legs    intermittent    Past Surgical History:  Past Surgical History:  Procedure Laterality Date   aortic aneurysm surgery     COLONOSCOPY     COLONOSCOPY WITH PROPOFOL N/A 06/14/2016   Procedure: COLONOSCOPY WITH PROPOFOL;  Surgeon: Lucilla Lame, MD;  Location: [place];  Service: Endoscopy;  Laterality: N/A;   ESOPHAGOGASTRODUODENOSCOPY (EGD) WITH PROPOFOL N/A 06/14/2016   Procedure: ESOPHAGOGASTRODUODENOSCOPY (EGD) WITH PROPOFOL;  Surgeon: Lucilla Lame, MD;  Location: [place];  Service: Endoscopy;  Laterality: N/A;   TUBAL LIGATION      Gynecologic History:  No LMP recorded. Patient is postmenopausal. Contraception: post menopausal status Last Pap: 05/15/2021 Results were: .no abnormalities  Family History:   Family History  Problem Relation Age of Onset   Leukemia Mother    Aneurysm Other     Social History:  Social History   Socioeconomic History   Marital status: Divorced    Spouse name: Not on file   Number of children: Not on file   Years of education: Not on file   Highest education level: Not on file  Occupational History   Not on file  Tobacco Use   Smoking status: Every Day    Packs/day: 1.00    Years: 30.00    Pack years: 30.00    Types: Cigarettes   Smokeless tobacco: Never  Vaping Use   Vaping Use: Not on file  Substance and Sexual Activity   Alcohol use: No   Drug use: Never   Sexual activity: Not Currently    Birth control/protection: Post-menopausal  Other Topics Concern   Not on file  Social History Narrative   Not on file   Social Determinants of Health   Financial Resource Strain: Not on file  Food Insecurity: Not on file  Transportation Needs: Not on file  Physical Activity: Not on file  Stress: Not on file  Social Connections: Not on file  Intimate Partner Violence: Not on file    Allergies:  No Known Allergies  Medications: Prior to Admission medications   Medication Sig Start Date End Date Taking? Authorizing Provider  ALPRAZolam (XANAX) 0.25 MG tablet Take 1 tablet (0.25 mg total)  by mouth daily as needed for anxiety. for anxiety 04/21/21  Yes McDonough, Lauren K, PA-C  amLODipine (NORVASC) 2.5 MG tablet Take 1 tablet (2.5 mg total) by mouth daily. For htn 03/08/21  Yes Lavera Guise, MD  levothyroxine (SYNTHROID) 50 MCG tablet TAKE 1 TABLET BY MOUTH EVERY MORNING 30 MINUTES PRIOR TO EATING FOOD 04/21/21  Yes McDonough, Lauren K, PA-C  pantoprazole (PROTONIX) 40 MG tablet Take 1 tablet (40 mg total) by mouth daily. 10/14/20  Yes Lavera Guise, MD  simvastatin (ZOCOR) 20 MG tablet Take by mouth. 12/05/20  Yes [provider]  traMADol-acetaminophen (ULTRACET) 37.5-325 MG tablet Take 1 tablet by mouth 2 (two) times daily as needed. 06/05/21   Yes McDonough, Lauren K, PA-C  zolpidem (AMBIEN) 10 MG tablet Take 1 tablet (10 mg total) by mouth at bedtime. 12/29/20  Yes Luiz Ochoa, NP  amoxicillin-clavulanate (AUGMENTIN) 875-125 MG tablet Take 1 tablet by mouth 2 (two) times daily. Take with food. 06/05/21   McDonough, Si Gaul, PA-C  aspirin 81 MG chewable tablet Chew by mouth. 11/21/20   [provider]  bisoprolol-hydrochlorothiazide (ZIAC) 2.5-6.25 MG tablet TAKE 1 TABLET BY MOUTH EVERY DAY Patient not taking: Reported on 05/15/2021 04/05/21   Lavera Guise, MD  meloxicam [facility]) 15 MG tablet Take 1/2 tab po daily Patient not taking: Reported on 04/21/2021 02/02/19   Ronnell Freshwater, NP  mupirocin ointment (BACTROBAN) 2 % Apply 1 application topically daily. With dressing changes Patient not taking: Reported on 04/21/2021 03/28/21   Laurence Ferrari, [place], MD  valACYclovir (VALTREX) 500 MG tablet Take 1 tablet (500 mg total) by mouth 2 (two) times daily. Patient not taking: Reported on 04/21/2021 08/01/18   Ronnell Freshwater, NP    Physical Exam Vitals: Blood pressure 136/72, height '5\' 7"'$  (1.702 m), weight 132 lb (59.9 kg).  General: NAD HEENT: normocephalic, anicteric Pulmonary: No increased work of breathing Extremities: no edema, erythema, or tenderness Neurologic: Grossly intact, normal gait Psychiatric: mood appropriate, affect full  US PELVIC COMPLETE WITH TRANSVAGINAL  Result Date: 06/08/2021 CLINICAL DATA:  Abnormal endometrium, pelvic pain, postmenopausal EXAM: TRANSABDOMINAL AND TRANSVAGINAL ULTRASOUND OF PELVIS TECHNIQUE: Both transabdominal and transvaginal ultrasound examinations of the pelvis were performed. Transabdominal technique was performed for global imaging of the pelvis including uterus, ovaries, adnexal regions, and pelvic cul-de-sac. It was necessary to proceed with endovaginal exam following the transabdominal exam to visualize the uterus, endometrium, and ovaries. COMPARISON:  None FINDINGS: Uterus  Measurements: 4.9 x 2.7 x 3.5 cm = volume: 24 mL. Anteverted. Atrophic. Normal morphology without mass Endometrium Endometrial layers are not adequately visualized. Endometrial canal is distended by heterogeneous hypoechoic material which could represent blood or complex fluid. No definite internal blood flow on color Doppler imaging within this collection. Collection measures approximately 2.9 x 1.8 x 2.7 cm. Right ovary Not visualized, question atrophic versus obscured by bowel Left ovary Not visualized question atrophic versus obscured by bowel Other findings No free pelvic fluid.  No adnexal masses. IMPRESSION: Nonvisualization of ovaries. Atrophic uterus with significantly distended endometrial canal by complex hypoechoic material, question blood or debris, difficult to exclude underlying mass/tumor though no internal blood flow seen on color Doppler imaging. Endometrial layers are not visualized. In the setting of nonvisualization of the endometrial complex and prior abnormal endometrium,, particularly with potential blood products/complex material within endometrial canal, endometrial biopsy is recommended to exclude malignancy. Electronically Signed   By: Lavonia Dana M.D.   On: 06/08/2021 16:57  ENDOMETRIAL BIOPSY     The indications for endometrial biopsy were reviewed.   Risks of the biopsy including cramping, bleeding, infection, uterine perforation, inadequate specimen and need for additional procedures  were discussed. The patient states she understands and agrees to undergo procedure today. Consent was signed. Time out was performed. Urine HCG was negative. A Graves speculum was placed and the cervix was brought into view.  The cervix was prepped with Betadine. A single-toothed tenaculum was placed on the anterior lip of the cervix for traction. A 3 mm pipelle was unable to be passed into the endometrial cavity secondary to cervical stenosis.  The cervix was flush with the vaginal vault.   There was a discernable external cervical os      Assessment: 70 y.o. G2P0 pelvic pain with thickened complex endometrium   Plan: Problem List Items Addressed This Visit   None Visit Diagnoses     Cervical stenosis (uterine cervix)    -  Primary   Abnormal endometrial ultrasound           1) Abnormal endometrium - Post hysteroscopy D&C as attempt at in office biopsy unsuccessful secondary to cervical stenosis.  No bleeding but grossly abnormal appearing endometrium on ultrasound warrants further evaluation.  2) A total of 15 minutes were spent in face-to-face contact with the patient during this encounter with over half of that time devoted to counseling and coordination of care.  3) Return if symptoms worsen or fail to improve.    Malachy Mood, MD, [facility], [place] 06/13/2021, 3:33 PM

## 2021-06-17 ENCOUNTER — Other Ambulatory Visit: Payer: Self-pay | Admitting: Nurse Practitioner

## 2021-06-17 DIAGNOSIS — I700 Atherosclerosis of aorta: Secondary | ICD-10-CM

## 2021-06-19 ENCOUNTER — Encounter: Payer: Self-pay | Admitting: Physician Assistant

## 2021-06-19 ENCOUNTER — Ambulatory Visit (INDEPENDENT_AMBULATORY_CARE_PROVIDER_SITE_OTHER): Payer: Medicare HMO | Admitting: Physician Assistant

## 2021-06-19 ENCOUNTER — Other Ambulatory Visit: Payer: Self-pay

## 2021-06-19 DIAGNOSIS — Z01419 Encounter for gynecological examination (general) (routine) without abnormal findings: Secondary | ICD-10-CM | POA: Diagnosis not present

## 2021-06-19 DIAGNOSIS — Z0001 Encounter for general adult medical examination with abnormal findings: Secondary | ICD-10-CM

## 2021-06-19 DIAGNOSIS — Z1231 Encounter for screening mammogram for malignant neoplasm of breast: Secondary | ICD-10-CM

## 2021-06-19 DIAGNOSIS — R935 Abnormal findings on diagnostic imaging of other abdominal regions, including retroperitoneum: Secondary | ICD-10-CM

## 2021-06-19 DIAGNOSIS — I700 Atherosclerosis of aorta: Secondary | ICD-10-CM

## 2021-06-19 DIAGNOSIS — F411 Generalized anxiety disorder: Secondary | ICD-10-CM

## 2021-06-19 DIAGNOSIS — E039 Hypothyroidism, unspecified: Secondary | ICD-10-CM

## 2021-06-19 DIAGNOSIS — I10 Essential (primary) hypertension: Secondary | ICD-10-CM

## 2021-06-19 DIAGNOSIS — R300 Dysuria: Secondary | ICD-10-CM

## 2021-06-19 DIAGNOSIS — R5383 Other fatigue: Secondary | ICD-10-CM

## 2021-06-19 MED ORDER — ALPRAZOLAM 0.25 MG PO TABS: 0.2500 mg | ORAL_TABLET | Freq: Every day | ORAL | 1 refills | Status: DC | PRN

## 2021-06-19 NOTE — Progress Notes (Signed)
[facility] [place], [place] [address]  Internal MEDICINE  Office Visit Note  Patient Name: Joy Carpenter  [id number]  [id number]  Date of Service: 06/21/2021  Chief Complaint  Patient presents with   Medicare Wellness   Anxiety   Depression   Hyperlipidemia   Quality Metric Gaps    mammogram     HPI Pt is here for routine health maintenance examination -Had follow up with OBGYN following abnormal [place] with abnormal endometrium and d/c with pelvic pain. He did procedure in office for bx but was not successful due to cervical stenosis. Planned to go into hospital for a different procedure--possible hysteroscopy? Patient states this has not been scheduled yet. -Takes xanax most days to help with anxiety in the AM -Does not take ambien most days--maybe 1/2 tab 2-3 times per week -BP at home the same as in office -routine labs ordered -Due for mammogram -UTD on colonoscopy and pap  Current Medication: Outpatient Encounter Medications as of 06/19/2021  Medication Sig Note   amLODipine (NORVASC) 2.5 MG tablet Take 1 tablet (2.5 mg total) by mouth daily. For htn    amoxicillin-clavulanate (AUGMENTIN) 875-125 MG tablet Take 1 tablet by mouth 2 (two) times daily. Take with food.    aspirin 81 MG chewable tablet Chew by mouth.    levothyroxine (SYNTHROID) 50 MCG tablet TAKE 1 TABLET BY MOUTH EVERY MORNING 30 MINUTES PRIOR TO EATING FOOD    meloxicam (MOBIC) 15 MG tablet Take 1/2 tab po daily    mupirocin ointment (BACTROBAN) 2 % Apply 1 application topically daily. With dressing changes    pantoprazole (PROTONIX) 40 MG tablet Take 1 tablet (40 mg total) by mouth daily.    simvastatin (ZOCOR) 20 MG tablet Take by mouth.    traMADol-acetaminophen (ULTRACET) 37.5-325 MG tablet Take 1 tablet by mouth 2 (two) times daily as needed.    valACYclovir (VALTREX) 500 MG tablet Take 1 tablet (500 mg total) by mouth 2 (two) times daily. 05/15/2021: As needed   zolpidem  (AMBIEN) 10 MG tablet Take 1 tablet (10 mg total) by mouth at bedtime.    [DISCONTINUED] ALPRAZolam (XANAX) 0.25 MG tablet Take 1 tablet (0.25 mg total) by mouth daily as needed for anxiety. for anxiety    ALPRAZolam (XANAX) 0.25 MG tablet Take 1 tablet (0.25 mg total) by mouth daily as needed for anxiety. for anxiety    bisoprolol-hydrochlorothiazide (ZIAC) 2.5-6.25 MG tablet TAKE 1 TABLET BY MOUTH EVERY DAY (Patient not taking: No sig reported)    No facility-administered encounter medications on file as of 06/19/2021.    Surgical History: Past Surgical History:  Procedure Laterality Date   aortic aneurysm surgery     COLONOSCOPY     COLONOSCOPY WITH PROPOFOL N/A 06/14/2016   Procedure: COLONOSCOPY WITH PROPOFOL;  Surgeon: Lucilla Lame, MD;  Location: [place];  Service: Endoscopy;  Laterality: N/A;   ESOPHAGOGASTRODUODENOSCOPY (EGD) WITH PROPOFOL N/A 06/14/2016   Procedure: ESOPHAGOGASTRODUODENOSCOPY (EGD) WITH PROPOFOL;  Surgeon: Lucilla Lame, MD;  Location: [place];  Service: Endoscopy;  Laterality: N/A;   TUBAL LIGATION      Medical History: Past Medical History:  Diagnosis Date   Actinic keratosis 03/28/2021   right upper arm, bx proven   Anxiety    Aortic aneurysm (HCC)    Carpal tunnel syndrome 2019   Depression    Emphysema of lung ([place])    "mild"   GERD (gastroesophageal reflux disease)    Headache  daily - stress   Herpes    Hyperlipidemia    Insomnia    Osteoporosis    SCC (squamous cell carcinoma) 03/28/2021   left dorsal hand, EDC   Vertigo    1 episode - approx 2010    Weakness of both legs    intermittent    Family History: Family History  Problem Relation Age of Onset   Leukemia Mother    Aneurysm Other       Review of Systems  Constitutional:  Negative for chills, fatigue and unexpected weight change.  HENT:  Negative for congestion, postnasal drip, rhinorrhea, sneezing and sore throat.   Eyes:  Negative for redness.   Respiratory:  Negative for cough, chest tightness and shortness of breath.   Cardiovascular:  Negative for chest pain and palpitations.  Gastrointestinal:  Positive for abdominal pain. Negative for constipation, diarrhea, nausea and vomiting.  Genitourinary:  Positive for dyspareunia, pelvic pain and vaginal discharge. Negative for dysuria and frequency.  Musculoskeletal:  Negative for arthralgias, back pain, joint swelling and neck pain.  Skin:  Negative for rash.  Neurological: Negative.  Negative for tremors and numbness.  Hematological:  Negative for adenopathy. Does not bruise/bleed easily.  Psychiatric/Behavioral:  Negative for behavioral problems (Depression), sleep disturbance and suicidal ideas. The patient is nervous/anxious.     Vital Signs: BP 130/63   Pulse 80   Temp 97.8 F (36.6 C)   Resp 16   Ht '5\' 6"'$  (1.676 m)   Wt 131 lb (59.4 kg)   SpO2 98%   BMI 21.14 kg/m    Physical Exam Vitals and nursing note reviewed.  Constitutional:      General: She is not in acute distress.    Appearance: She is well-developed and normal weight. She is not diaphoretic.  HENT:     Head: Normocephalic and atraumatic.     Right Ear: External ear normal.     Left Ear: External ear normal.     Nose: Nose normal.     Mouth/Throat:     Pharynx: No oropharyngeal exudate.  Eyes:     General: No scleral icterus.       Right eye: No discharge.        Left eye: No discharge.     Conjunctiva/sclera: Conjunctivae normal.     Pupils: Pupils are equal, round, and reactive to light.  Neck:     Thyroid: No thyromegaly.     Vascular: No JVD.     Trachea: No tracheal deviation.  Cardiovascular:     Rate and Rhythm: Normal rate and regular rhythm.     Heart sounds: Normal heart sounds. No murmur heard.   No friction rub. No gallop.  Pulmonary:     Effort: Pulmonary effort is normal. No respiratory distress.     Breath sounds: Normal breath sounds. No stridor. No wheezing or rales.   Chest:     Chest wall: No tenderness.  Breasts:    Right: Normal. No mass.     Left: Normal. No mass.  Abdominal:     General: Bowel sounds are normal. There is no distension.     Palpations: Abdomen is soft. There is no mass.     Tenderness: There is abdominal tenderness. There is no guarding or rebound.  Musculoskeletal:        General: No tenderness or deformity. Normal range of motion.     Cervical back: Normal range of motion and neck supple.  Lymphadenopathy:  Cervical: No cervical adenopathy.  Skin:    General: Skin is warm and dry.     Coloration: Skin is not pale.     Findings: No erythema or rash.  Neurological:     Mental Status: She is alert.     Cranial Nerves: No cranial nerve deficit.     Motor: No abnormal muscle tone.     Coordination: Coordination normal.     Deep Tendon Reflexes: Reflexes are normal and symmetric.  Psychiatric:        Behavior: Behavior normal.        Thought Content: Thought content normal.        Judgment: Judgment normal.     LABS: Recent Results (from the past 2160 hour(s))  CULTURE, URINE COMPREHENSIVE     Status: None   Collection Time: 04/21/21  9:50 AM   Specimen: Urine   Urine  Result Value Ref Range   Urine Culture, Comprehensive Final report    Organism ID, Bacteria Comment     Comment: Mixed urogenital flora 5,000  Colonies/mL   Chlamydia/Gonococcus/Trichomonas, NAA     Status: None   Collection Time: 04/21/21  9:50 AM   Specimen: Urine   Urine  Result Value Ref Range   Chlamydia by NAA Negative Negative   Gonococcus by NAA Negative Negative   Trich vag by NAA Negative Negative  POCT Urinalysis Dipstick     Status: Abnormal   Collection Time: 04/21/21  9:51 AM  Result Value Ref Range   Color, UA     Clarity, UA     Glucose, UA Negative Negative   Bilirubin, UA neg    Ketones, UA neg    Spec Grav, UA 1.025 1.010 - 1.025   Blood, UA large    pH, UA 6.0 5.0 - 8.0   Protein, UA Positive (A) Negative     Comment: 30+   Urobilinogen, UA 0.2 0.2 or 1.0 E.U./dL   Nitrite, UA neg    Leukocytes, UA Moderate (2+) (A) Negative   Appearance     Odor    Cytology - PAP     Status: None   Collection Time: 05/15/21  3:24 PM  Result Value Ref Range   High risk HPV Negative    Trichomonas Negative    Chlamydia Negative    Neisseria Gonorrhea Negative    Adequacy      Satisfactory for evaluation; transformation zone component PRESENT.   Diagnosis      - Negative for Intraepithelial Lesions or Malignancy (NILM)   Diagnosis - Benign reactive/reparative changes    Comment Normal Reference Range HPV - Negative    Comment Normal Reference Range Trichomonas - Negative    Comment Normal Reference [place] Chlamydia - Negative    Comment      Normal Reference Range Neisseria Gonorrhea - Negative  UA/M w/rflx Culture, Routine     Status: None   Collection Time: 06/19/21  3:28 PM   Specimen: Urine   Urine  Result Value Ref Range   Specific Gravity, UA 1.020 1.005 - 1.030   pH, UA 5.0 5.0 - 7.5   Color, UA Yellow Yellow   Appearance Ur Clear Clear   Leukocytes,UA Negative Negative   Protein,UA Negative Negative/Trace   Glucose, UA Negative Negative   Ketones, UA Negative Negative   RBC, UA Negative Negative   Bilirubin, UA Negative Negative   Urobilinogen, Ur 0.2 0.2 - 1.0 mg/dL   Nitrite, UA Negative Negative   Microscopic Examination  Comment     Comment: Microscopic follows if indicated.   Microscopic Examination See below:     Comment: Microscopic was indicated and was performed.   Urinalysis Reflex Comment     Comment: This specimen will not reflex to a Urine Culture.  Microscopic Examination     Status: None   Collection Time: 06/19/21  3:28 PM   Urine  Result Value Ref Range   WBC, UA 0-5 0 - 5 /hpf   RBC None seen 0 - 2 /hpf   Epithelial Cells (non renal) 0-10 0 - 10 /hpf   Casts None seen None seen /lpf   Bacteria, UA None seen None seen/Few       Assessment/Plan: 1.  Encounter for general adult medical examination with abnormal findings CPE performed, UTD on pap and colonoscopy, due for routine labs and mammogram  2. Essential hypertension Stable, continue current medication  3. Generalized anxiety disorder May continue xanax as needed - ALPRAZolam (XANAX) 0.25 MG tablet; Take 1 tablet (0.25 mg total) by mouth daily as needed for anxiety. for anxiety  Dispense: 30 tablet; Refill: 1  4. Aortic atherosclerosis ([place]) Will update labs, continue simvastatin - Lipid Panel With LDL/HDL Ratio  5. Acquired hypothyroidism Will update labs and adjust synthroid dose as indicated - TSH + free T4  6. Abnormal endometrial ultrasound Followed by OBGYN with plan for possible hysteroscopy  7. Encounter for screening mammogram for breast cancer - MM DIGITAL SCREENING BILATERAL; Future  8. Other fatigue - TSH + free T4 - Lipid Panel With LDL/HDL Ratio - Comprehensive metabolic panel - CBC w/Diff/Platelet  9. Dysuria - UA/M w/rflx Culture, Routine   General Counseling: eshani milberger understanding of the findings of todays visit and agrees with plan of treatment. I have discussed any further diagnostic evaluation that may be needed or ordered today. We also reviewed her medications today. she has been encouraged to call the office with any questions or concerns that should arise related to todays visit.    Counseling:    Orders Placed This Encounter  Procedures   Microscopic Examination   MM DIGITAL SCREENING BILATERAL   UA/M w/rflx Culture, Routine   TSH + free T4   Lipid Panel With LDL/HDL Ratio   Comprehensive metabolic panel   CBC w/Diff/Platelet    Meds ordered this encounter  Medications   ALPRAZolam (XANAX) 0.25 MG tablet    Sig: Take 1 tablet (0.25 mg total) by mouth daily as needed for anxiety. for anxiety    Dispense:  30 tablet    Refill:  1    This patient was seen by Drema [place], PA-C in collaboration with Dr. Clayborn Bigness as a part of collaborative care agreement.  Total time spent:35 Minutes  Time spent includes review of chart, medications, test results, and follow up plan with the patient.     Lavera Guise, MD  Internal Medicine

## 2021-06-20 LAB — UA/M W/RFLX CULTURE, ROUTINE
Bilirubin, UA: NEGATIVE
Glucose, UA: NEGATIVE
Ketones, UA: NEGATIVE
Leukocytes,UA: NEGATIVE
Nitrite, UA: NEGATIVE
Protein,UA: NEGATIVE
RBC, UA: NEGATIVE
Specific Gravity, UA: 1.02 (ref 1.005–1.030)
Urobilinogen, Ur: 0.2 mg/dL (ref 0.2–1.0)
pH, UA: 5 (ref 5.0–7.5)

## 2021-06-20 LAB — MICROSCOPIC EXAMINATION
Bacteria, UA: NONE SEEN
Casts: NONE SEEN /lpf
RBC, Urine: NONE SEEN /hpf (ref 0–2)

## 2021-06-21 ENCOUNTER — Other Ambulatory Visit: Payer: Self-pay | Admitting: Nurse Practitioner

## 2021-06-21 DIAGNOSIS — I700 Atherosclerosis of aorta: Secondary | ICD-10-CM

## 2021-06-26 ENCOUNTER — Telehealth: Payer: Self-pay

## 2021-06-26 NOTE — Telephone Encounter (Signed)
Called patient to schedule hysteroscopy D&C w Georgianne Fick. [place] the [place] so I was unable to provide her with all the details. I will follow back up with her later this week when she is back home.  DOS 10/11  H&P  10/6 @  2:50 in [place] phone call appointment to be requested - date and time will be included on H&P paper work. Also all appointments will be updated on pt MyChart. Explained that this appointment has a call window. Based on the time scheduled will indicate if the call will be received within a 4 hour window before 1:00 or after.  Advised that pt may also receive calls from the hospital pharmacy and pre-service center.  Confirmed pt has [employer] as Chartered certified accountant. No secondary insurance.

## 2021-06-26 NOTE — Telephone Encounter (Signed)
-----   Message from Malachy Mood, MD sent at 06/17/2021  7:50 PM EDT ----- Regarding: Surgery Surgery Booking Request Patient Full Name:  Joy Carpenter  MRN: [id number]  DOB: 05/23/1951  Surgeon: Malachy Mood, MD  Requested Surgery Date and Time: 1-4 weeks Primary Diagnosis AND Code: Abnormal endometrial ultrasound R93.5 Secondary Diagnosis and Code:  Surgical Procedure: Hysteroscopy D&C RNFA Requested?: No L&D Notification: No Admission Status: same day surgery Length of Surgery: 50 min Special Case Needs: No H&P: Yes Phone Interview???:  Yes Interpreter: No Medical Clearance:  No Special Scheduling Instructions: No Any known health/anesthesia issues, diabetes, sleep apnea, latex allergy, defibrillator/pacemaker?: No Acuity: P2   (P1 highest, P2 delay may cause harm, P3 low, elective gyn, P4 lowest) Post op follow up visits: 1 week and 6 weeks

## 2021-06-27 ENCOUNTER — Encounter: Payer: Medicare HMO | Admitting: Dermatology

## 2021-06-30 ENCOUNTER — Other Ambulatory Visit: Payer: Self-pay | Admitting: Internal Medicine

## 2021-06-30 DIAGNOSIS — F411 Generalized anxiety disorder: Secondary | ICD-10-CM

## 2021-06-30 DIAGNOSIS — Z79899 Other long term (current) drug therapy: Secondary | ICD-10-CM

## 2021-06-30 DIAGNOSIS — R109 Unspecified abdominal pain: Secondary | ICD-10-CM

## 2021-06-30 DIAGNOSIS — Z0001 Encounter for general adult medical examination with abnormal findings: Secondary | ICD-10-CM

## 2021-06-30 DIAGNOSIS — F5101 Primary insomnia: Secondary | ICD-10-CM

## 2021-06-30 DIAGNOSIS — Z124 Encounter for screening for malignant neoplasm of cervix: Secondary | ICD-10-CM

## 2021-06-30 DIAGNOSIS — E559 Vitamin D deficiency, unspecified: Secondary | ICD-10-CM

## 2021-06-30 DIAGNOSIS — Z1239 Encounter for other screening for malignant neoplasm of breast: Secondary | ICD-10-CM

## 2021-06-30 DIAGNOSIS — R300 Dysuria: Secondary | ICD-10-CM

## 2021-06-30 DIAGNOSIS — R5383 Other fatigue: Secondary | ICD-10-CM

## 2021-06-30 DIAGNOSIS — K219 Gastro-esophageal reflux disease without esophagitis: Secondary | ICD-10-CM

## 2021-07-04 DIAGNOSIS — Z1231 Encounter for screening mammogram for malignant neoplasm of breast: Secondary | ICD-10-CM | POA: Diagnosis not present

## 2021-07-04 LAB — HM MAMMOGRAPHY: HM Mammogram: NORMAL (ref 0–4)

## 2021-07-09 ENCOUNTER — Encounter: Payer: Self-pay | Admitting: Internal Medicine

## 2021-07-11 ENCOUNTER — Telehealth: Payer: Self-pay

## 2021-07-11 NOTE — Telephone Encounter (Signed)
Pt calling; is having a D&C done on the 11th; has questions.  [phone]  Mailbox is full.

## 2021-07-12 NOTE — Telephone Encounter (Signed)
Questions answered; adv some questions will also be answered tomorrow at H&P.

## 2021-07-13 ENCOUNTER — Other Ambulatory Visit: Payer: Self-pay

## 2021-07-13 ENCOUNTER — Encounter: Payer: Self-pay | Admitting: Obstetrics and Gynecology

## 2021-07-13 ENCOUNTER — Ambulatory Visit (INDEPENDENT_AMBULATORY_CARE_PROVIDER_SITE_OTHER): Payer: Medicare HMO | Admitting: Obstetrics and Gynecology

## 2021-07-13 VITALS — BP 145/77 | Ht 66.0 in | Wt 132.0 lb

## 2021-07-13 DIAGNOSIS — R935 Abnormal findings on diagnostic imaging of other abdominal regions, including retroperitoneum: Secondary | ICD-10-CM | POA: Diagnosis not present

## 2021-07-13 DIAGNOSIS — Z01818 Encounter for other preprocedural examination: Secondary | ICD-10-CM

## 2021-07-13 DIAGNOSIS — N882 Stricture and stenosis of cervix uteri: Secondary | ICD-10-CM | POA: Diagnosis not present

## 2021-07-13 DIAGNOSIS — N950 Postmenopausal bleeding: Secondary | ICD-10-CM

## 2021-07-14 ENCOUNTER — Other Ambulatory Visit: Payer: Self-pay

## 2021-07-14 ENCOUNTER — Encounter
Admission: RE | Admit: 2021-07-14 | Discharge: 2021-07-14 | Disposition: A | Discharge status: Home / Self Care | Payer: Medicare HMO | Source: Ambulatory Visit | Attending: Obstetrics and Gynecology | Admitting: Obstetrics and Gynecology

## 2021-07-14 ENCOUNTER — Encounter: Payer: Self-pay | Admitting: Obstetrics and Gynecology

## 2021-07-14 ENCOUNTER — Other Ambulatory Visit: Payer: Self-pay | Admitting: Nurse Practitioner

## 2021-07-14 DIAGNOSIS — I700 Atherosclerosis of aorta: Secondary | ICD-10-CM

## 2021-07-14 HISTORY — DX: Essential (primary) hypertension: I10

## 2021-07-14 HISTORY — DX: Hypothyroidism, unspecified: E03.9

## 2021-07-14 NOTE — Patient Instructions (Addendum)
Your procedure is scheduled on: Tuesday 07/18/21 Report to the Registration Desk on the 1st floor of the [place] [facility]. To find out your arrival time, please call [phone] between 1PM - 3PM on: Monday 07/17/21  REMEMBER: Instructions that are not followed completely may result in serious medical risk, up to and including death; or upon the discretion of your surgeon and anesthesiologist your surgery may need to be rescheduled.  Do not eat food after midnight the night before surgery.  No gum chewing, lozengers or hard candies.  You may however, drink CLEAR liquids up to 2 hours before you are scheduled to arrive for your surgery. Do not drink anything within 2 hours of your scheduled arrival time.  Clear liquids include: - water  - apple juice without pulp - gatorade (not RED, PURPLE, OR BLUE) - black coffee or tea (Do NOT add milk or creamers to the coffee or tea) Do NOT drink anything that is not on this list.  TAKE THESE MEDICATIONS THE MORNING OF SURGERY WITH A SIP OF WATER: ALPRAZolam (XANAX) 0.25 MG tablet if needed levothyroxine (SYNTHROID) 50 MCG tablet simvastatin (ZOCOR) 20 MG tablet valACYclovir (VALTREX) 500 MG tablet traMADol-acetaminophen (ULTRACET) 37.5-325 MG tablet if needed and if you can take without food (DO NOT TAKE WITH FOOD)   pantoprazole (PROTONIX) 40 MG tablet (take one the night before and one on the morning of surgery - helps to prevent nausea after surgery.)  Aspirin can be taken up to day prior to surgery per Dr. Georgianne Fick.  One week prior to surgery: Stop Anti-inflammatories (NSAIDS) such as Advil, Aleve, Ibuprofen, Motrin, Naproxen, Naprosyn and Aspirin based products such as Excedrin, Goodys Powder, BC Powder. Stop ANY OVER THE COUNTER supplements until after surgery. You may however, continue to take Tylenol if needed for pain up until the day of surgery.  No Alcohol for 24 hours before or after surgery.  No Smoking including e-cigarettes for  24 hours prior to surgery.  No chewable tobacco products for at least 6 hours prior to surgery.  No nicotine patches on the day of surgery.  Do not use any "recreational" drugs for at least a week prior to your surgery.  Please be advised that the combination of cocaine and anesthesia may have negative outcomes, up to and including death. If you test positive for cocaine, your surgery will be cancelled.  On the morning of surgery brush your teeth with toothpaste and water, you may rinse your mouth with mouthwash if you wish. Do not swallow any toothpaste or mouthwash.  Do not wear jewelry, make-up, hairpins, clips or nail polish.  Do not wear lotions, powders, or perfumes.   Do not shave body from the neck down 48 hours prior to surgery just in case you cut yourself which could leave a site for infection.   Do not bring valuables to the hospital. [facility] is not responsible for any missing/lost belongings or valuables.   Notify your doctor if there is any change in your medical condition (cold, fever, infection).  Wear comfortable clothing (specific to your surgery type) to the hospital.  After surgery, you can help prevent lung complications by doing breathing exercises.  Take deep breaths and cough every 1-2 hours. Y  If you are being discharged the day of surgery, you will not be allowed to drive home. You will need a responsible adult (18 years or older) to drive you home and stay with you that night.   If you are taking  public transportation, you will need to have a responsible adult (18 years or older) with you. Please confirm with your physician that it is acceptable to use public transportation.   Please call the [facility] at [phone] if you have any questions about these instructions.  Surgery Visitation Policy:  Patients undergoing a surgery or procedure may have one family member or support person with them as long as that person is not  COVID-19 positive or experiencing its symptoms.  That person may remain in the waiting area during the procedure and may rotate out with other people.  Inpatient Visitation:    Visiting hours are 7 a.m. to 8 p.m. Up to two visitors ages 16+ are allowed at one time in a patient room. The visitors may rotate out with other people during the day. Visitors must check out when they leave, or other visitors will not be allowed. One designated support person may remain overnight. The visitor must pass COVID-19 screenings, use hand sanitizer when entering and exiting the patient's room and wear a mask at all times, including in the patient's room. Patients must also wear a mask when staff or their visitor are in the room. Masking is required regardless of vaccination status.

## 2021-07-17 ENCOUNTER — Other Ambulatory Visit: Payer: Self-pay

## 2021-07-17 ENCOUNTER — Encounter: Payer: Self-pay | Admitting: Urgent Care

## 2021-07-17 ENCOUNTER — Encounter
Admission: RE | Admit: 2021-07-17 | Discharge: 2021-07-17 | Disposition: A | Discharge status: Home / Self Care | Payer: Medicare HMO | Source: Ambulatory Visit | Attending: Obstetrics and Gynecology | Admitting: Obstetrics and Gynecology

## 2021-07-17 ENCOUNTER — Telehealth: Payer: Self-pay | Admitting: Pharmacist

## 2021-07-17 DIAGNOSIS — Z01818 Encounter for other preprocedural examination: Secondary | ICD-10-CM | POA: Insufficient documentation

## 2021-07-17 LAB — CBC
HCT: 37.5 % (ref 36.0–46.0)
Hemoglobin: 12.5 g/dL (ref 12.0–15.0)
MCH: 33.4 pg (ref 26.0–34.0)
MCHC: 33.3 g/dL (ref 30.0–36.0)
MCV: 100.3 fL — ABNORMAL HIGH (ref 80.0–100.0)
Platelets: 173 10*3/uL (ref 150–400)
RBC: 3.74 MIL/uL — ABNORMAL LOW (ref 3.87–5.11)
RDW: 15.1 % (ref 11.5–15.5)
WBC: 5.8 10*3/uL (ref 4.0–10.5)
nRBC: 0 % (ref 0.0–0.2)

## 2021-07-17 NOTE — Progress Notes (Signed)
Chronic Care Management Pharmacy Assistant   Name: BROOKLYNN BRANDENBURG  MRN: [id number] DOB: 02-09-1951  Joy Carpenter is an 70 y.o. year old female who presents for his initial CCM visit with the clinical pharmacist.  Reason for Encounter: Chart Prep   Conditions to be addressed/monitored: HLD, HTN, Vitamin D Deficiency.  Primary concerns for visit include: HTN.  Recent office visits:  06/19/21  Mylinda Latina, PA-C. For medicare wellness. No medication changes. 06/05/21 McDonough, Si Gaul, PA-C. For acute visit. STARTED Augmentin 875-125 mg 1 tablet 2 times daily. STOPPED Oxycodone.  04/21/21 McDonough, Si Gaul, PA-C. For follow-up. STARTED Ciprofloxacin 500 mg 2 times daily. CHANGED Oxycodone to 5 mg 1/2 tablet as needed. 03/08/21 Dr. Humphrey Rolls For acute visit. STARTED Amlodipine 2.5 mg 1 tablet daily, Ciprofloxacin 500 mg 2 times daily, and Oxycodone 5 mg 1 tablet as needed.   Recent consult visits:  07/13/21 De Nurse, MD For pre-op exam.  06/13/21 De Nurse, MD For follow-up. No medication changes.  05/18/21 Vascular Surgery Donia Pounds, MD For follow-up. No medication changes. 05/15/21 De Nurse, MD For pelvic pain. No medication changes.  04/12/21 Madelyn Flavors, MD.  For suture/staple removal. No medication changes. 03/28/21 Madelyn Flavors, MD. For skin problem. STARTED Mupirocin 2% 1 application topical daily. 03/02/21 Vascular Surgery Donia Pounds, MD For follow-up. No medication changes.  Hospital visits:  None in the last six months.  Medication History: Simvastatin 20 mg 03/04/21 90 DS.  Medications: Outpatient Encounter Medications as of 07/17/2021  Medication Sig Note   ALPRAZolam (XANAX) 0.25 MG tablet Take 1 tablet (0.25 mg total) by mouth daily as needed for anxiety. for anxiety    amLODipine (NORVASC) 2.5 MG tablet Take 1 tablet (2.5 mg total) by mouth daily. For htn    amoxicillin-clavulanate  (AUGMENTIN) 875-125 MG tablet Take 1 tablet by mouth 2 (two) times daily. Take with food. (Patient not taking: Reported on 07/14/2021)    aspirin 81 MG chewable tablet Chew by mouth.    bisoprolol-hydrochlorothiazide (ZIAC) 2.5-6.25 MG tablet TAKE 1 TABLET BY MOUTH EVERY DAY (Patient not taking: No sig reported)    levothyroxine (SYNTHROID) 50 MCG tablet TAKE 1 TABLET BY MOUTH EVERY MORNING 30 MINUTES PRIOR TO EATING FOOD    meloxicam (MOBIC) 15 MG tablet Take 1/2 tab po daily (Patient not taking: Reported on 07/14/2021)    mupirocin ointment (BACTROBAN) 2 % Apply 1 application topically daily. With dressing changes (Patient not taking: Reported on 07/14/2021)    pantoprazole (PROTONIX) 40 MG tablet TAKE 1 TABLET BY MOUTH EVERY DAY    simvastatin (ZOCOR) 20 MG tablet Take by mouth.    traMADol-acetaminophen (ULTRACET) 37.5-325 MG tablet Take 1 tablet by mouth 2 (two) times daily as needed.    valACYclovir (VALTREX) 500 MG tablet Take 1 tablet (500 mg total) by mouth 2 (two) times daily. 05/15/2021: As needed   zolpidem (AMBIEN) 10 MG tablet Take 1 tablet (10 mg total) by mouth at bedtime.    No facility-administered encounter medications on file as of 07/17/2021.    Have you seen any other providers since your last visit? Patient stated no.  Any changes in your medications or health? Patient stated no.   Any side effects from any medications? Patient stated no.   Do you have an symptoms or problems not managed by your medications? Patient stated no.  Any concerns about your health right now? Patient stated no.  Has your provider  asked that you check blood pressure, blood sugar, or follow special diet at home? Patient stated she checks her blood pressure regularly.   Do you get any type of exercise on a regular basis? Patient stated no, but she does work part time.   Can you think of a goal you would like to reach for your health? Patient stated she nothing.   Do you have any problems  getting your medications? Patient stated no.   Is there anything that you would like to discuss during the appointment? Patient stated no.   Please bring medications and supplements to appointment, patient reminded of her OTP appointment on 07/19/21 at 130 pm.  Follow-Up:Pharmacist Review  Charlann Lange, [facility] Pharmacist Assistant [phone]

## 2021-07-18 ENCOUNTER — Encounter: Payer: Self-pay | Admitting: Obstetrics and Gynecology

## 2021-07-18 ENCOUNTER — Encounter
Admission: RE | Disposition: A | Discharge status: Home / Self Care | Payer: Self-pay | Source: Home / Self Care | Attending: Obstetrics and Gynecology

## 2021-07-18 ENCOUNTER — Ambulatory Visit: Payer: Medicare HMO | Admitting: Urgent Care

## 2021-07-18 ENCOUNTER — Other Ambulatory Visit: Payer: Self-pay

## 2021-07-18 ENCOUNTER — Ambulatory Visit
Admission: RE | Admit: 2021-07-18 | Discharge: 2021-07-18 | Disposition: A | Discharge status: Home / Self Care | Payer: Medicare HMO | Attending: Obstetrics and Gynecology | Admitting: Obstetrics and Gynecology

## 2021-07-18 DIAGNOSIS — N809 Endometriosis, unspecified: Secondary | ICD-10-CM

## 2021-07-18 DIAGNOSIS — N711 Chronic inflammatory disease of uterus: Secondary | ICD-10-CM | POA: Diagnosis not present

## 2021-07-18 DIAGNOSIS — Z85828 Personal history of other malignant neoplasm of skin: Secondary | ICD-10-CM | POA: Insufficient documentation

## 2021-07-18 DIAGNOSIS — F17210 Nicotine dependence, cigarettes, uncomplicated: Secondary | ICD-10-CM | POA: Diagnosis not present

## 2021-07-18 DIAGNOSIS — Z01810 Encounter for preprocedural cardiovascular examination: Secondary | ICD-10-CM | POA: Diagnosis not present

## 2021-07-18 DIAGNOSIS — Z79899 Other long term (current) drug therapy: Secondary | ICD-10-CM | POA: Diagnosis not present

## 2021-07-18 DIAGNOSIS — K219 Gastro-esophageal reflux disease without esophagitis: Secondary | ICD-10-CM | POA: Diagnosis not present

## 2021-07-18 DIAGNOSIS — N950 Postmenopausal bleeding: Secondary | ICD-10-CM | POA: Diagnosis not present

## 2021-07-18 DIAGNOSIS — N710 Acute inflammatory disease of uterus: Secondary | ICD-10-CM | POA: Diagnosis not present

## 2021-07-18 DIAGNOSIS — R935 Abnormal findings on diagnostic imaging of other abdominal regions, including retroperitoneum: Secondary | ICD-10-CM

## 2021-07-18 DIAGNOSIS — Z79890 Hormone replacement therapy (postmenopausal): Secondary | ICD-10-CM | POA: Diagnosis not present

## 2021-07-18 DIAGNOSIS — M4802 Spinal stenosis, cervical region: Secondary | ICD-10-CM | POA: Insufficient documentation

## 2021-07-18 DIAGNOSIS — N882 Stricture and stenosis of cervix uteri: Secondary | ICD-10-CM

## 2021-07-18 DIAGNOSIS — Z7982 Long term (current) use of aspirin: Secondary | ICD-10-CM | POA: Insufficient documentation

## 2021-07-18 DIAGNOSIS — R9389 Abnormal findings on diagnostic imaging of other specified body structures: Secondary | ICD-10-CM | POA: Diagnosis not present

## 2021-07-18 HISTORY — PX: HYSTEROSCOPY WITH D & C: SHX1775

## 2021-07-18 HISTORY — DX: Thoracic aortic aneurysm, without rupture, unspecified: I71.20

## 2021-07-18 SURGERY — DILATATION AND CURETTAGE /HYSTEROSCOPY
Anesthesia: General

## 2021-07-18 MED ORDER — SUGAMMADEX SODIUM 500 MG/5ML IV SOLN
INTRAVENOUS | Status: AC
  Filled 2021-07-18: qty 5

## 2021-07-18 MED ORDER — ONDANSETRON HCL 4 MG/2ML IJ SOLN
INTRAMUSCULAR | Status: DC | PRN
  Administered 2021-07-18: 4 mg via INTRAVENOUS

## 2021-07-18 MED ORDER — POVIDONE-IODINE 10 % EX SWAB
2.0000 | Freq: Once | CUTANEOUS | Status: DC
Start: 2021-07-18 — End: 2021-07-18

## 2021-07-18 MED ORDER — PROPOFOL 10 MG/ML IV BOLUS
INTRAVENOUS | Status: DC | PRN
  Administered 2021-07-18: 160 mg via INTRAVENOUS

## 2021-07-18 MED ORDER — HYDROCODONE-ACETAMINOPHEN 5-325 MG PO TABS: 1.0000 | ORAL_TABLET | Freq: Four times a day (QID) | ORAL | 0 refills | Status: DC | PRN

## 2021-07-18 MED ORDER — MEPERIDINE HCL 25 MG/ML IJ SOLN: 6.2500 mg | INTRAMUSCULAR | Status: DC | PRN

## 2021-07-18 MED ORDER — LIDOCAINE HCL (CARDIAC) PF 100 MG/5ML IV SOSY
PREFILLED_SYRINGE | INTRAVENOUS | Status: DC | PRN
  Administered 2021-07-18: 100 mg via INTRAVENOUS

## 2021-07-18 MED ORDER — OXYCODONE HCL 5 MG PO TABS
ORAL_TABLET | ORAL | Status: AC
  Filled 2021-07-18: qty 1

## 2021-07-18 MED ORDER — FENTANYL CITRATE (PF) 100 MCG/2ML IJ SOLN
INTRAMUSCULAR | Status: DC | PRN
  Administered 2021-07-18 (×2): 25 ug via INTRAVENOUS

## 2021-07-18 MED ORDER — EPHEDRINE SULFATE 50 MG/ML IJ SOLN
INTRAMUSCULAR | Status: DC | PRN
  Administered 2021-07-18 (×3): 5 mg via INTRAVENOUS
  Administered 2021-07-18: 10 mg via INTRAVENOUS

## 2021-07-18 MED ORDER — KETOROLAC TROMETHAMINE 30 MG/ML IJ SOLN
INTRAMUSCULAR | Status: AC
  Filled 2021-07-18: qty 1

## 2021-07-18 MED ORDER — CEFAZOLIN SODIUM 1 G IJ SOLR
INTRAMUSCULAR | Status: AC
  Filled 2021-07-18: qty 20

## 2021-07-18 MED ORDER — CHLORHEXIDINE GLUCONATE 0.12 % MT SOLN
15.0000 mL | Freq: Once | OROMUCOSAL | Status: AC
  Administered 2021-07-18: 15 mL via OROMUCOSAL

## 2021-07-18 MED ORDER — ORAL CARE MOUTH RINSE: 15.0000 mL | Freq: Once | OROMUCOSAL | Status: AC

## 2021-07-18 MED ORDER — OXYCODONE HCL 5 MG PO TABS
5.0000 mg | ORAL_TABLET | ORAL | Status: AC
  Administered 2021-07-18: 5 mg via ORAL

## 2021-07-18 MED ORDER — CHLORHEXIDINE GLUCONATE 0.12 % MT SOLN
OROMUCOSAL | Status: AC
  Filled 2021-07-18: qty 15

## 2021-07-18 MED ORDER — EPHEDRINE 5 MG/ML INJ
INTRAVENOUS | Status: AC
  Filled 2021-07-18: qty 5

## 2021-07-18 MED ORDER — CEFAZOLIN SODIUM-DEXTROSE 2-3 GM-%(50ML) IV SOLR
INTRAVENOUS | Status: DC | PRN
  Administered 2021-07-18: 2 g via INTRAVENOUS

## 2021-07-18 MED ORDER — ACETAMINOPHEN 10 MG/ML IV SOLN
INTRAVENOUS | Status: DC | PRN
  Administered 2021-07-18: 1000 mg via INTRAVENOUS

## 2021-07-18 MED ORDER — ONDANSETRON HCL 4 MG/2ML IJ SOLN
INTRAMUSCULAR | Status: AC
  Filled 2021-07-18: qty 2

## 2021-07-18 MED ORDER — PHENYLEPHRINE HCL (PRESSORS) 10 MG/ML IV SOLN
INTRAVENOUS | Status: DC | PRN
  Administered 2021-07-18: 80 ug via INTRAVENOUS
  Administered 2021-07-18: 160 ug via INTRAVENOUS

## 2021-07-18 MED ORDER — DEXAMETHASONE SODIUM PHOSPHATE 10 MG/ML IJ SOLN
INTRAMUSCULAR | Status: DC | PRN
  Administered 2021-07-18: 10 mg via INTRAVENOUS

## 2021-07-18 MED ORDER — FENTANYL CITRATE (PF) 100 MCG/2ML IJ SOLN
INTRAMUSCULAR | Status: AC
  Filled 2021-07-18: qty 2

## 2021-07-18 MED ORDER — GLYCOPYRROLATE 0.2 MG/ML IJ SOLN
INTRAMUSCULAR | Status: DC | PRN
  Administered 2021-07-18: .2 mg via INTRAVENOUS

## 2021-07-18 MED ORDER — ONDANSETRON HCL 4 MG/2ML IJ SOLN: 4.0000 mg | Freq: Once | INTRAMUSCULAR | Status: DC | PRN

## 2021-07-18 MED ORDER — FENTANYL CITRATE (PF) 100 MCG/2ML IJ SOLN
25.0000 ug | INTRAMUSCULAR | Status: DC | PRN
  Administered 2021-07-18: 25 ug via INTRAVENOUS

## 2021-07-18 MED ORDER — ACETAMINOPHEN 10 MG/ML IV SOLN
INTRAVENOUS | Status: AC
  Filled 2021-07-18: qty 100

## 2021-07-18 MED ORDER — LIDOCAINE HCL (PF) 2 % IJ SOLN
INTRAMUSCULAR | Status: AC
  Filled 2021-07-18: qty 5

## 2021-07-18 MED ORDER — LACTATED RINGERS IV SOLN: INTRAVENOUS | Status: DC

## 2021-07-18 MED ORDER — PROPOFOL 10 MG/ML IV BOLUS
INTRAVENOUS | Status: AC
  Filled 2021-07-18: qty 20

## 2021-07-18 MED ORDER — DEXAMETHASONE SODIUM PHOSPHATE 10 MG/ML IJ SOLN
INTRAMUSCULAR | Status: AC
  Filled 2021-07-18: qty 1

## 2021-07-18 SURGICAL SUPPLY — 25 items
CATH ROBINSON RED A/P 16FR (CATHETERS) ×2 IMPLANT
DEVICE MYOSURE LITE (MISCELLANEOUS) ×2 IMPLANT
DEVICE MYOSURE REACH (MISCELLANEOUS) IMPLANT
ELECT REM PT RETURN 9FT ADLT (ELECTROSURGICAL)
ELECTRODE REM PT RTRN 9FT ADLT (ELECTROSURGICAL) IMPLANT
GAUZE 4X4 16PLY ~~LOC~~+RFID DBL (SPONGE) ×2 IMPLANT
GLOVE SURG ENC MOIS LTX SZ7 (GLOVE) ×2 IMPLANT
GLOVE SURG UNDER LTX SZ7.5 (GLOVE) ×2 IMPLANT
GOWN STRL REUS W/ TWL LRG LVL3 (GOWN DISPOSABLE) ×2 IMPLANT
GOWN STRL REUS W/TWL LRG LVL3 (GOWN DISPOSABLE) ×2
INFUSOR MANOMETER BAG 3000ML (MISCELLANEOUS) IMPLANT
IV LACTATED RINGER IRRG 3000ML (IV SOLUTION)
IV LR IRRIG 3000ML ARTHROMATIC (IV SOLUTION) IMPLANT
IV NS IRRIG 3000ML ARTHROMATIC (IV SOLUTION) ×2 IMPLANT
KIT PROCEDURE FLUENT (KITS) IMPLANT
KIT TURNOVER CYSTO (KITS) ×2 IMPLANT
MANIFOLD NEPTUNE II (INSTRUMENTS) ×2 IMPLANT
PACK DNC HYST (MISCELLANEOUS) ×2 IMPLANT
PAD OB MATERNITY 4.3X12.25 (PERSONAL CARE ITEMS) ×2 IMPLANT
PAD PREP 24X41 OB/GYN DISP (PERSONAL CARE ITEMS) ×2 IMPLANT
SCRUB EXIDINE 4% CHG 4OZ (MISCELLANEOUS) ×2 IMPLANT
SEAL ROD LENS SCOPE MYOSURE (ABLATOR) ×2 IMPLANT
TOWEL OR 17X26 4PK STRL BLUE (TOWEL DISPOSABLE) ×2 IMPLANT
TUBING CONNECTING 10 (TUBING) ×2 IMPLANT
WATER STERILE IRR 500ML POUR (IV SOLUTION) ×2 IMPLANT

## 2021-07-18 NOTE — Discharge Instructions (Signed)

## 2021-07-18 NOTE — Progress Notes (Signed)
Chronic Care Management Pharmacy Note  07/19/2021 Name:  Joy Carpenter MRN:  [id number] DOB:  04-01-1951  Summary: Initial visit with PharmD.  Discussed all meds and updated list.  Recently stopped bisoprolol/HCTZ and BP has been elevated at past MD visits.  Have asked her to monitor for a week.  Also noted elevated LDL in August.  Patient on moderate intensity statin with upcoming labs before OV.  Recommendations/Changes made from today's visit: Based on lipid panel could consider increase to high intensity statin  Plan: FU 4 months   Subjective: Joy Carpenter is an 70 y.o. year old female who is a primary patient of Humphrey Rolls, Timoteo Gaul, MD.  The CCM team was consulted for assistance with disease management and care coordination needs.    Engaged with patient by telephone for initial visit in response to provider referral for pharmacy case management and/or care coordination services.   Consent to Services:  The patient was given the following information about Chronic Care Management services today, agreed to services, and gave verbal consent: 1. CCM service includes personalized support from designated clinical staff supervised by the primary care provider, including individualized plan of care and coordination with other care providers 2. 24/7 contact phone numbers for assistance for urgent and routine care needs. 3. Service will only be billed when office clinical staff spend 20 minutes or more in a month to coordinate care. 4. Only one practitioner may furnish and bill the service in a calendar month. 5.The patient may stop CCM services at any time (effective at the end of the month) by phone call to the office staff. 6. The patient will be responsible for cost sharing (co-pay) of up to 20% of the service fee (after annual deductible is met). Patient agreed to services and consent obtained.  Patient Care Team: Lavera Guise, MD as PCP - General (Internal Medicine) Edythe Clarity, [facility] as Pharmacist (Pharmacist)  Recent office visits:  06/19/21  Mylinda Latina, PA-C. For medicare wellness. No medication changes. 06/05/21 McDonough, Si Gaul, PA-C. For acute visit. STARTED Augmentin 875-125 mg 1 tablet 2 times daily. STOPPED Oxycodone.  04/21/21 McDonough, Si Gaul, PA-C. For follow-up. STARTED Ciprofloxacin 500 mg 2 times daily. CHANGED Oxycodone to 5 mg 1/2 tablet as needed. 03/08/21 Dr. Humphrey Rolls For acute visit. STARTED Amlodipine 2.5 mg 1 tablet daily, Ciprofloxacin 500 mg 2 times daily, and Oxycodone 5 mg 1 tablet as needed.    Recent consult visits:  07/13/21 De Nurse, MD For pre-op exam.  06/13/21 De Nurse, MD For follow-up. No medication changes.  05/18/21 Vascular Surgery Donia Pounds, MD For follow-up. No medication changes. 05/15/21 De Nurse, MD For pelvic pain. No medication changes.  04/12/21 Madelyn Flavors, MD.  For suture/staple removal. No medication changes. 03/28/21 Madelyn Flavors, MD. For skin problem. STARTED Mupirocin 2% 1 application topical daily. 03/02/21 Vascular Surgery Donia Pounds, MD For follow-up. No medication changes.   Hospital visits:  None in the last six months.   Medication History: Simvastatin 20 mg 03/04/21 90 DS.   Objective:  Lab Results  Component Value Date   CREATININE 1.33 (H) 05/26/2020   BUN 26 05/26/2020   GFRNONAA 41 (L) 05/26/2020   GFRAA 48 (L) 05/26/2020   NA 137 05/26/2020   K 4.3 05/26/2020   CALCIUM 9.9 05/26/2020   CO2 23 05/26/2020   GLUCOSE 100 (H) 05/26/2020    No results found for: HGBA1C, FRUCTOSAMINE, GFR, MICROALBUR  Last diabetic Eye exam: No results found for: HMDIABEYEEXA  Last diabetic Foot exam: No results found for: HMDIABFOOTEX   Lab Results  Component Value Date   CHOL 200 (H) 05/26/2020   HDL 61 05/26/2020   LDLCALC 130 (H) 05/26/2020   TRIG 48 05/26/2020   CHOLHDL 3.3 06/01/2019    Hepatic Function Latest Ref Rng &  Units 05/26/2020 06/01/2019  Total Protein 6.0 - 8.5 g/dL 7.0 6.7  Albumin 3.8 - 4.8 g/dL 4.6 4.3  AST 0 - 40 IU/L 18 14  ALT 0 - 32 IU/L 10 10  Alk Phosphatase 48 - 121 IU/L 146(H) 139(H)  Total Bilirubin 0.0 - 1.2 mg/dL 0.3 0.3    Lab Results  Component Value Date/Time   TSH 1.010 05/26/2020 09:45 AM   TSH 0.490 06/01/2019 09:26 AM   FREET4 1.13 05/26/2020 09:45 AM   FREET4 1.04 06/01/2019 09:26 AM    CBC Latest Ref Rng & Units 07/17/2021 05/26/2020 06/01/2019  WBC 4.0 - 10.5 K/uL 5.8 5.6 5.4  Hemoglobin 12.0 - 15.0 g/dL 12.5 15.1 15.1  Hematocrit 36.0 - 46.0 % 37.5 45.1 43.4  Platelets 150 - 400 K/uL 173 259 233    Lab Results  Component Value Date/Time   VD25OH 39.8 05/26/2020 09:45 AM    Clinical ASCVD: No  The 10-year ASCVD risk score (Arnett DK, et al., 2019) is: 20.8%   Values used to calculate the score:     Age: 27 years     Sex: Female     Is Non-Hispanic African American: No     Diabetic: No     Tobacco smoker: Yes     Systolic Blood Pressure: 010 mmHg     Is BP treated: Yes     HDL Cholesterol: 61 mg/dL     Total Cholesterol: 200 mg/dL    Depression screen [facility] 2/9 06/19/2021 04/21/2021 12/29/2020  Decreased Interest 0 0 0  Down, Depressed, Hopeless 0 0 0  PHQ - 2 Score 0 0 0     Social History   Tobacco Use  Smoking Status Every Day   Packs/day: 1.00   Years: 30.00   Pack years: 30.00   Types: Cigarettes  Smokeless Tobacco Never   BP Readings from Last 3 Encounters:  07/18/21 (!) 145/81  07/13/21 (!) 145/77  06/19/21 130/63   Pulse Readings from Last 3 Encounters:  07/18/21 95  06/19/21 80  06/05/21 76   Wt Readings from Last 3 Encounters:  07/18/21 132 lb 0.9 oz (59.9 kg)  07/14/21 132 lb (59.9 kg)  07/13/21 132 lb (59.9 kg)   BMI Readings from Last 3 Encounters:  07/18/21 21.31 kg/m  07/14/21 21.31 kg/m  07/13/21 21.31 kg/m    Assessment/Interventions: Review of patient past medical history, allergies, medications, health  status, including review of consultants reports, laboratory and other test data, was performed as part of comprehensive evaluation and provision of chronic care management services.   SDOH:  (Social Determinants of Health) assessments and interventions performed: Yes  Financial Resource Strain: Not on file    SDOH Screenings   Alcohol Screen: Low Risk    Last Alcohol Screening Score (AUDIT): 0  Depression (PHQ2-9): Low Risk    PHQ-2 Score: 0  Financial Resource Strain: Not on file  Food Insecurity: Not on file  Housing: Not on file  Physical Activity: Not on file  Social Connections: Not on file  Stress: Not on file  Tobacco Use: High Risk   Smoking Tobacco Use:  Every Day   Smokeless Tobacco Use: Never  Transportation Needs: Not on file    [place]  No Known Allergies  Medications Reviewed Today     Reviewed by Edythe Clarity, [facility] (Pharmacist) on 07/19/21 at 66  Med List Status: <None>   Medication Order Taking? Sig Documenting Provider Last Dose Status Informant  ALPRAZolam (XANAX) 0.25 MG tablet [id number] Yes Take 1 tablet (0.25 mg total) by mouth daily as needed for anxiety. for anxiety McDonough, Si Gaul, PA-C Taking Active   amLODipine (NORVASC) 2.5 MG tablet [id number] Yes Take 1 tablet (2.5 mg total) by mouth daily. For htn Lavera Guise, MD Taking Active   aspirin 81 MG chewable tablet [id number] Yes Chew by mouth. [provider] Taking Active   HYDROcodone-acetaminophen (NORCO/VICODIN) 5-325 MG tablet [id number] Yes Take 1 tablet by mouth every 6 (six) hours as needed. Malachy Mood, MD Taking Active   levothyroxine (SYNTHROID) 50 MCG tablet [id number] Yes TAKE 1 TABLET BY MOUTH EVERY MORNING 30 MINUTES PRIOR TO EATING FOOD McDonough, Lauren K, PA-C Taking Active   pantoprazole (PROTONIX) 40 MG tablet [id number] Yes TAKE 1 TABLET BY MOUTH EVERY DAY McDonough, Lauren K, PA-C Taking Active   simvastatin (ZOCOR) 20 MG tablet [id number] Yes Take by  mouth. [provider] Taking Active   traMADol-acetaminophen (ULTRACET) 37.5-325 MG tablet [id number] Yes Take 1 tablet by mouth 2 (two) times daily as needed. McDonough, Si Gaul, PA-C Taking Active   zolpidem (AMBIEN) 10 MG tablet [id number] Yes Take 1 tablet (10 mg total) by mouth at bedtime. 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/19/2020   Urinary tract infection with hematuria 01/23/2020   Abdominal pain 01/23/2020   Acquired hypothyroidism 10/11/2019   Need for vaccination against Streptococcus pneumoniae using pneumococcal conjugate vaccine 13 10/11/2019   Laceration of right lower extremity 10/11/2019   Right upper quadrant pain 06/17/2019   Gallbladder sludge 06/17/2019   Other specified diseases of gallbladder 06/17/2019   Gastroesophageal reflux disease without esophagitis 04/30/2019   Vitamin D deficiency 04/30/2019   Other fatigue 04/30/2019   Primary generalized (osteo)arthritis 12/26/2018   Atopic dermatitis 12/26/2018   Acute upper respiratory infection 08/27/2018   Carpal tunnel syndrome on both sides 08/27/2018   Chronic idiopathic constipation 05/18/2018   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/2018   Dysuria 05/18/2018   Left sided abdominal pain 01/04/2018   Primary insomnia 01/04/2018   Generalized anxiety disorder 01/04/2018   Tobacco use disorder 07/31/2016   Hyperlipidemia 07/31/2016   Atherosclerosis of native arteries of extremity with intermittent claudication ([place]) 07/31/2016   Abdominal aortic aneurysm (AAA) without rupture 07/31/2016   Abdominal pain, epigastric    Abnormal findings-gastrointestinal tract    Gastritis    Other specified diseases of esophagus    Encounter for long-term (current) use of medications      Immunization History  Administered Date(s) Administered   Influenza Inj Mdck Quad Pf 07/17/2017   Influenza, High Dose Seasonal PF 09/10/2018   Influenza,inj,Quad PF,6+ Mos 09/05/2015   Influenza-Unspecified 08/02/2020   PFIZER(Purple Top)SARS-COV-2 Vaccination 05/16/2020, 06/06/2020, 12/19/2020   Pneumococcal Conjugate-13 10/08/2019   Zoster, Live 05/08/2013    Conditions to be addressed/monitored:  GERD, Hypothyroidism, GAD, Insomnia, HLD, Arthritis, HTN  Care  Plan : General Pharmacy (Adult)  Updates made by Edythe Clarity, RPH since 07/19/2021 12:00 AM     Problem: GERD, Hypothyroidism, GAD, Insomnia, HLD, Arthritis, HTN   Priority: High  Onset Date: 07/19/2021     Long-Range Goal: Patient-Specific Goal   Start Date: 07/19/2021  Expected End Date: 01/17/2022  This Visit's Progress: On track  Priority: High  Note:   Current Barriers:  Unable to achieve control of lipids   Pharmacist Clinical Goal(s):  Patient will achieve control of LDL as evidenced by labs through collaboration with PharmD and provider.   Interventions: 1:1 collaboration with Lavera Guise, MD regarding development and update of comprehensive plan of care as evidenced by provider attestation and co-signature Inter-disciplinary care team collaboration (see longitudinal plan of care) Comprehensive medication review performed; medication list updated in electronic medical record  Hypertension (BP goal <140/90) -Controlled -Current treatment: Amlodipine 2.12m daily -Medications previously tried: bisoprolol/HCTZ  -Current home readings: normal at homne per patient -Current dietary habits: pretty good, avoiding salt currently -Current exercise habits: none -Denies hypotensive/hypertensive symptoms -Educated on BP goals and benefits of medications for prevention of heart attack, stroke and kidney damage; Daily salt intake goal < 2300 mg; Importance of home blood pressure monitoring; Symptoms  of hypotension and importance of maintaining adequate hydration; -Counseled to monitor BP at home daily, document, and provide log at future appointments -Recommended to continue current medication Monitor at home, if BP continues to be elevated as it was during last two MD visits, contact providers.  Hyperlipidemia: (LDL goal < 100) -Uncontrolled -Current treatment: Simvastatin 277mdaily - AM -Medications previously tried: none noted  -Current exercise habits: none -Educated on Cholesterol goals;  Benefits of statin for ASCVD risk reduction; Importance of limiting foods high in cholesterol; -Most recent LDL was elevated, she has not tried any other statin  -Recommended to continue current medication Has active labs to be drawn prior to next OV - current ASCVD risk is 20.8% would recommend high intensity statin based off of that - would refrain from making changes at this time pending upcoming labs and make decision at that time.  Anxiety (Goal: Minimize symptoms) -Controlled -Current treatment: Alprazolam 0.2551mrn -Medications previously tried/failed: none noted -PHQ9:  PHQ9 SCORE ONLY 06/19/2021 04/21/2021 12/29/2020  PHQ-9 Total Score 0 0 0  -Takes occasionally in mornings when she has "shaky" feeling -Educated on Benefits of medication for symptom control -Recommended to continue current medication  GERD (Goal: Minimze symptoms) -Controlled -Current treatment  Pantoprazole 25m74medications previously tried: none noted -has been taking for some time, no current symptoms -Would consider step down therapy due to length of therapy.  -Recommended to continue current medication Consider step down therapy in future  Insomnia (Goal: Improve sleep quality) -Controlled -Current treatment  Zolpidem 10mg22m -Medications previously tried: none noted -Takes occasionally when she has problems falling asleep  -Recommended to continue current medication  Hypothyroidism (Goal:  Maintain TSh) -Controlled -Current treatment  Levothryoxine 50mcg52mly -Medications previously tried: none noted -TSH is WNL, patient is adherent to med and takes appropriately  -Recommended to continue current medication   Patient Goals/Self-Care Activities Patient will:  - take medications as prescribed focus on medication adherence by pill packs check blood pressure a few times per week, document, and provide at future appointments  Follow Up Plan: The care management team will reach out to the patient again over the next 120 days.         Medication Assistance: None required.  Patient affirms current coverage meets needs.  Compliance/Adherence/Medication fill history: Care Gaps: None  Star-Rating Drugs: Simvastatin 80m 03/04/21  Patient's preferred pharmacy is:  [id number] [id number] [id number]: [phone]: [phone] pill box? Yes Pt endorses 100% compliance  We discussed: Benefits of medication synchronization, packaging and delivery as well as enhanced pharmacist oversight with [facility]. Patient decided to: Utilize [facility] for medication synchronization, packaging and delivery Verbal consent obtained for [facility] enhanced pharmacy services (medication synchronization, adherence packaging, delivery coordination). A medication sync plan was created to allow patient to get all medications delivered once every 30 to 90 days per patient preference. Patient understands they have freedom to choose pharmacy and clinical pharmacist will coordinate care between all prescribers and [facility].  Care Plan and Follow Up Patient Decision:  Patient agrees to Care Plan and Follow-up.  Plan: The care management team will reach out to the patient again over the next 120 days. ChBeverly MilchPharmD Clinical Pharmacist NoBoston Children'[phone]

## 2021-07-18 NOTE — Anesthesia Preprocedure Evaluation (Signed)
Anesthesia Evaluation  Patient identified by MRN, date of birth, ID band Patient awake    Reviewed: Allergy & Precautions, H&P , NPO status , Patient's Chart, lab work & pertinent test results, reviewed documented beta blocker date and time   Airway Mallampati: II  TM Distance: >3 FB Neck ROM: full    Dental no notable dental hx.    Pulmonary COPD, Current Smoker,    Pulmonary exam normal        Cardiovascular hypertension, Pt. on medications and Pt. on home beta blockers + Peripheral Vascular Disease  Normal cardiovascular exam     Neuro/Psych  Headaches, PSYCHIATRIC DISORDERS Anxiety Depression  Neuromuscular disease    GI/Hepatic hiatal hernia, GERD  Medicated,  Endo/Other  Hypothyroidism   Renal/GU      Musculoskeletal   Abdominal   Peds  Hematology   Anesthesia Other Findings Actinic keratosis 03/28/2021 right upper arm, bx proven  Anxiety    Carpal tunnel syndrome 2019  Depression    Emphysema of lung ([place])  "mild" GERD (gastroesophageal reflux disease) Headache  daily - stress  Herpes    Hyperlipidemia    Hypertension    Hypothyroidism    Insomnia    Osteoporosis    SCC (squamous cell carcinoma) 03/28/2021 left dorsal hand, EDC Thoracic aortic aneurysm (TAA)  a.) s/p 4v FEVAR (CA, SMA, RRA, LRA) and proximal TEVAR extention with BILATERAL iliac limb extensions on 11/16/2020  Vertigo  1 episode - approx 2010  Weakness of both legs  intermittent     Reproductive/Obstetrics                             Anesthesia Physical  Anesthesia Plan  ASA: 3  Anesthesia Plan: General   Post-op Pain Management:    Induction: Intravenous  PONV Risk Score and Plan: 2 and Propofol infusion, Ondansetron and Midazolam  Airway Management Planned: LMA  Additional Equipment:   Intra-op Plan:   Post-operative Plan: Extubation in OR  Informed Consent: I have reviewed the patients  History and Physical, chart, labs and discussed the procedure including the risks, benefits and alternatives for the proposed anesthesia with the patient or authorized representative who has indicated his/her understanding and acceptance.       Plan Discussed with: CRNA, Anesthesiologist and Surgeon  Anesthesia Plan Comments:         Anesthesia Quick Evaluation

## 2021-07-18 NOTE — Anesthesia Postprocedure Evaluation (Signed)
Anesthesia Post Note  Patient: Joy Carpenter  Procedure(s) Performed: DILATATION AND CURETTAGE /HYSTEROSCOPY  Patient location during evaluation: PACU Anesthesia Type: General Level of consciousness: awake and alert, awake and oriented Pain management: pain level controlled Vital Signs Assessment: post-procedure vital signs reviewed and stable Respiratory status: spontaneous breathing, nonlabored ventilation and respiratory function stable Cardiovascular status: blood pressure returned to baseline and stable Postop Assessment: no apparent nausea or vomiting Anesthetic complications: no   No notable events documented.   Last Vitals:  Vitals:   07/18/21 1530 07/18/21 1544  BP: 128/73 (!) 145/81  Pulse: 99 95  Resp: 15 18  Temp: 36.4 C (!) 35.8 C  SpO2: 99% 100%    Last Pain:  Vitals:   07/18/21 1544  TempSrc: Temporal  PainSc: 0-No pain                 Phill Mutter

## 2021-07-18 NOTE — H&P (Signed)
Obstetrics & Gynecology Surgery H&P    Chief Complaint: Scheduled Surgery   History of Present Illness: Patient is a 70 y.o. G2P0 presenting for scheduled hysteroscopy D&C, for the treatment or further evaluation of abnormal endometrial ultrasound and postmenopausal bleeding with cervical stenosis .   Prior Treatments prior to proceeding with surgery include: attempt at in office endometrial biopsy  Preoperative Pap: 05/15/2021 NILM HPV negative Preoperative Endometrial biopsy: attempted 06/13/21 but unsuccessful secondary to cervical stenosis Preoperative Ultrasound: 06/08/2021 thickened and complex appearing endometrial stripe   Review of Systems:10 point review of systems  Past Medical History:  Patient Active Problem List   Diagnosis Date Noted   Encounter for screening mammogram for malignant neoplasm of breast 09/02/2020   Hernia, hiatal 06/15/2020   Thoracoabdominal aortic aneurysm (TAAA) without rupture 04/19/2020   Urinary tract infection with hematuria 01/23/2020   Abdominal pain 01/23/2020   Acquired hypothyroidism 10/11/2019   Need for vaccination against Streptococcus pneumoniae using pneumococcal conjugate vaccine 13 10/11/2019   Laceration of right lower extremity 10/11/2019   Right upper quadrant pain 06/17/2019   Gallbladder sludge 06/17/2019   Other specified diseases of gallbladder 06/17/2019   Gastroesophageal reflux disease without esophagitis 04/30/2019   Vitamin D deficiency 04/30/2019   Other fatigue 04/30/2019   Primary generalized (osteo)arthritis 12/26/2018   Atopic dermatitis 12/26/2018   Acute upper respiratory infection 08/27/2018   Carpal tunnel syndrome on both sides 08/27/2018   Chronic idiopathic constipation 05/18/2018   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/2018   Dysuria 05/18/2018   Left sided abdominal pain 01/04/2018   Primary insomnia 01/04/2018   Generalized anxiety disorder 01/04/2018   Tobacco use disorder 07/31/2016   Hyperlipidemia  07/31/2016   Atherosclerosis of native arteries of extremity with intermittent claudication ([place]) 07/31/2016   Abdominal aortic aneurysm (AAA) without rupture 07/31/2016   Abdominal pain, epigastric    Abnormal findings-gastrointestinal tract    Gastritis    Other specified diseases of esophagus    Encounter for long-term (current) use of medications     Past Surgical History:  Past Surgical History:  Procedure Laterality Date   COLONOSCOPY     COLONOSCOPY WITH PROPOFOL N/A 06/14/2016   Procedure: COLONOSCOPY WITH PROPOFOL;  Surgeon: Lucilla Lame, MD;  Location: [facility];  Service: Endoscopy;  Laterality: N/A;   ESOPHAGOGASTRODUODENOSCOPY (EGD) WITH PROPOFOL N/A 06/14/2016   Procedure: ESOPHAGOGASTRODUODENOSCOPY (EGD) WITH PROPOFOL;  Surgeon: Lucilla Lame, MD;  Location: [facility];  Service: Endoscopy;  Laterality: N/A;   THORACIC AORTIC ANEURYSM REPAIR N/A 11/16/2020   Procedure: 4V FEVAR (CA, SMA, RRA, LRA) WITH PROXIMAL TEVAR EXTENSION WITH BILATERAL ILIAC LIMB EXTENSIONS; Location: [facility]; Surgeon; Vinnie Level, MD   TUBAL LIGATION      Family History:  Family History  Problem Relation Age of Onset   Leukemia Mother    Aneurysm Other     Social History:  Social History   Socioeconomic History   Marital status: Divorced    Spouse name: Not on file   Number of children: Not on file   Years of education: Not on file   Highest education level: Not on file  Occupational History   Not on file  Tobacco Use   Smoking status: Every Day    Packs/day: 1.00    Years: 30.00    Pack years: 30.00    Types: Cigarettes   Smokeless tobacco: Never  Vaping Use   Vaping Use: Never used  Substance and Sexual Activity   Alcohol use: No  Drug use: Never   Sexual activity: Not Currently    Birth control/protection: Post-menopausal  Other Topics Concern   Not on file  Social History Narrative   Not on file   Social Determinants of Health   Financial Resource  Strain: Not on file  Food Insecurity: Not on file  Transportation Needs: Not on file  Physical Activity: Not on file  Stress: Not on file  Social Connections: Not on file  Intimate Partner Violence: Not on file    Allergies:  No Known Allergies  Medications: Prior to Admission medications   Medication Sig Start Date End Date Taking? Authorizing Provider  ALPRAZolam (XANAX) 0.25 MG tablet Take 1 tablet (0.25 mg total) by mouth daily as needed for anxiety. for anxiety 06/19/21  Yes McDonough, Lauren K, PA-C  amLODipine (NORVASC) 2.5 MG tablet Take 1 tablet (2.5 mg total) by mouth daily. For htn 03/08/21  Yes Lavera Guise, MD  aspirin 81 MG chewable tablet Chew by mouth. 11/21/20  Yes [provider]  levothyroxine (SYNTHROID) 50 MCG tablet TAKE 1 TABLET BY MOUTH EVERY MORNING 30 MINUTES PRIOR TO EATING FOOD 04/21/21  Yes McDonough, Lauren K, PA-C  pantoprazole (PROTONIX) 40 MG tablet TAKE 1 TABLET BY MOUTH EVERY DAY 06/30/21  Yes McDonough, Lauren K, PA-C  simvastatin (ZOCOR) 20 MG tablet Take by mouth. 12/05/20  Yes [provider]  valACYclovir (VALTREX) 500 MG tablet Take 1 tablet (500 mg total) by mouth 2 (two) times daily. 08/01/18  Yes Boscia, Greer Ee, NP  zolpidem (AMBIEN) 10 MG tablet Take 1 tablet (10 mg total) by mouth at bedtime. 12/29/20  Yes Luiz Ochoa, NP  amoxicillin-clavulanate (AUGMENTIN) 875-125 MG tablet Take 1 tablet by mouth 2 (two) times daily. Take with food. Patient not taking: Reported on 07/14/2021 06/05/21   Mylinda Latina, PA-C  bisoprolol-hydrochlorothiazide (ZIAC) 2.5-6.25 MG tablet TAKE 1 TABLET BY MOUTH EVERY DAY Patient not taking: No sig reported 04/05/21   Lavera Guise, MD  meloxicam [facility]) 15 MG tablet Take 1/2 tab po daily Patient not taking: Reported on 07/14/2021 02/02/19   Ronnell Freshwater, NP  mupirocin ointment (BACTROBAN) 2 % Apply 1 application topically daily. With dressing changes Patient not taking: Reported on 07/14/2021  03/28/21   Laurence Ferrari, [place], MD  traMADol-acetaminophen (ULTRACET) 37.5-325 MG tablet Take 1 tablet by mouth 2 (two) times daily as needed. 06/05/21   McDonough, Si Gaul, PA-C    Physical Exam Vitals: Blood pressure 135/89, pulse 80, temperature (!) 97.2 F (36.2 C), temperature source Oral, resp. rate 20, height 5\' 6"  (1.676 m), weight 59.9 kg, SpO2 100 %. General: NAD HEENT: normocephalic, anicteric Pulmonary: No increased work of breathing Cardiovascular: RRR, distal pulses 2+ Abdomen: soft, non-tender, non-distended Genitourinary: deferred Extremities: no edema, erythema, or tenderness Neurologic: Grossly intact Psychiatric: mood appropriate, affect full  Imaging No results found.  Assessment: 70 y.o. G2P0 presenting for scheduled with postmenopausal bleeding and thickened complex endometrial stripe  Plan: 1) I have discussed with the patient the indications for the procedure. Included in the discussion were the options of therapy, as wall as their individual risks, benefits, and complications. Ample time was given to answer all questions.   In office pipelle biopsy generally provides comparable results to [facility], however this sampling modality may miss focal lesions if these were previously documented on ultrasound.  It is because of the potential to miss focal lesions that hysteroscopy D&C is also warranted in patient with continued postmenopausal bleeding that is  not self limited regardless of prior in office biopsy results or ultrasound findings.  She understands that the risk of continued observation include worsening bleeding or worsening of any underlying pathology.  The choices include: 1. Doing nothing but following her symptoms 2. Attempts at hormonal manipulation with either BCP or Depo-Provera for premenopausal patients with no concern for focal lesion or endometrial pathology 3. D&C/hysteroscopy. 4. Endometrial ablation via Novasure or other techniques for premenopausal patients  with no concern for focal lesion or endometrial pathology  5. As final resort, hysterectomy. After consideration of her history and findings, mutual decision has been made to proceed with D+C/hysteroscopy. While the incidence is low, the risks from this surgery include, but are not limited to, the risks of anesthesia, hemorrhage, infection, perforation, and injury to adjacent structures including bowel, bladder and blood vessels.    2) Routine postoperative instructions were reviewed with the patient and her family in detail today including the expected length of recovery and likely postoperative course.  The patient concurred with the proposed plan, giving informed written consent for the surgery today.  Patient instructed on the importance of being NPO after midnight prior to her procedure.  If warranted preoperative prophylactic antibiotics and SCDs ordered on call to the OR to meet SCIP guidelines and adhere to recommendation laid forth in [place] Number 104 May 2009  "Antibiotic Prophylaxis for Gynecologic Procedures".     Malachy Mood, MD, [facility], [facility] 07/18/2021, 1:57 PM

## 2021-07-18 NOTE — Op Note (Signed)
Preoperative Diagnosis: 1) 70 y.o. with postmenopausal bleeding and abnormal endometrial ultrasound 2) Cervical stenosis  Postoperative Diagnosis: 1) 70 y.o. with with postmenopausal bleeding and abnormal endometrial ultrasound 2) Cervical stenosis  Operation Performed: Hysteroscopy, dilation and curettage  Indication: Inability to obtain endometrial biopsy in office  Anesthesia: General  Primary Surgeon: Malachy Mood, MD  Assistant: none  Preoperative Antibiotics: 2g Ancef  Estimated Blood Loss: minimal  IV Fluids: 622mL  Urine Output:: ~4mL straight cath  Drains or Tubes: none  Implants: none  Specimens Removed: endometrial curettings  Complications: none  Intraoperative Findings:  Cervical os visible but stenotic, with cervix flush with posterior fourchette.  Upon dilation purulent material was noted to drain.  Hysteroscopy revealed lots of debris within the uterine cavity and adherent to the walls.  This easily brushed off with the hysteroscope.  Using a light mysure a four quadrant biopsy was obtained.  There was no evidence of endometrial polyps and the endometrium appeared atrophic.  Normal tubal ostia.  Normal cervical canal  Patient Condition: stable  Procedure in Detail:  Patient was taken to the operating room were she was administered general endotracheal anesthesia.  She was positioned in the dorsal lithotomy position utilizing Allen stirups, prepped and draped in the usual sterile fashion.  Uterus was noted to be small and anteverted.   Prior to proceeding with the case a time out was performed.  Attention was turned to the patient's pelvis.  A red rubber catheter was used to empty the patient's bladder.  An operative speculum was placed to allow visualization of the cervix.  The anterior lip of the cervix was grasped with a single tooth tenaculum and the cervix was sequentially dilated using pratt dilators.  The hysteroscope was then advanced into the  uterine cavity noting the above findings.  Targeted curettage was performed using a light Myosure device and the resulting specimen collected and sent to pathology.    The single tooth tenaculum was removed from the cervix.  The tenaculum sites and cervix were noted to be  Hemostatic before removing the operative speculum.  Sponge needle and instrument counts were corrects times two.  The patient tolerated the procedure well and was taken to the recovery room in stable condition.

## 2021-07-18 NOTE — Transfer of Care (Signed)
Immediate Anesthesia Transfer of Care Note  Patient: Joy Carpenter  Procedure(s) Performed: DILATATION AND CURETTAGE /HYSTEROSCOPY  Patient Location: PACU  Anesthesia Type:General  Level of Consciousness: awake, drowsy and patient cooperative  Airway & Oxygen Therapy: Patient Spontanous Breathing and Patient connected to face mask oxygen  Post-op Assessment: Report given to RN and Post -op Vital signs reviewed and stable  Post vital signs: Reviewed and stable  Last Vitals:  Vitals Value Taken Time  BP 127/71 07/18/21 1508  Temp 36.6 C 07/18/21 1508  Pulse 95 07/18/21 1514  Resp 16 07/18/21 1514  SpO2 98 % 07/18/21 1514  Vitals shown include unvalidated device data.  Last Pain:  Vitals:   07/18/21 1508  TempSrc:   PainSc: Asleep         Complications: No notable events documented.

## 2021-07-19 ENCOUNTER — Telehealth: Payer: Self-pay

## 2021-07-19 ENCOUNTER — Encounter: Payer: Self-pay | Admitting: Obstetrics and Gynecology

## 2021-07-19 ENCOUNTER — Ambulatory Visit: Payer: Medicare HMO | Admitting: Pharmacist

## 2021-07-19 DIAGNOSIS — E039 Hypothyroidism, unspecified: Secondary | ICD-10-CM

## 2021-07-19 DIAGNOSIS — I10 Essential (primary) hypertension: Secondary | ICD-10-CM

## 2021-07-19 DIAGNOSIS — E785 Hyperlipidemia, unspecified: Secondary | ICD-10-CM

## 2021-07-19 DIAGNOSIS — F5101 Primary insomnia: Secondary | ICD-10-CM

## 2021-07-19 DIAGNOSIS — F411 Generalized anxiety disorder: Secondary | ICD-10-CM

## 2021-07-19 NOTE — Patient Instructions (Addendum)
Visit Information   Goals Addressed             This Visit's Progress    Track and Manage My Blood Pressure-Hypertension       Timeframe:  Long-Range Goal Priority:  High Start Date:   07/19/21                          Expected End Date:      01/17/22                 Follow Up Date 10/19/21    - check blood pressure weekly - choose a place to take my blood pressure (home, clinic or office, retail store) - write blood pressure results in a log or diary    Why is this important?   You won't feel high blood pressure, but it can still hurt your blood vessels.  High blood pressure can cause heart or kidney problems. It can also cause a stroke.  Making lifestyle changes like losing a little weight or eating less salt will help.  Checking your blood pressure at home and at different times of the day can help to control blood pressure.  If the doctor prescribes medicine remember to take it the way the doctor ordered.  Call the office if you cannot afford the medicine or if there are questions about it.     Notes:        Patient Care Plan: General Pharmacy (Adult)     Problem Identified: GERD, Hypothyroidism, GAD, Insomnia, HLD, Arthritis, HTN   Priority: High  Onset Date: 07/19/2021     Long-Range Goal: Patient-Specific Goal   Start Date: 07/19/2021  Expected End Date: 01/17/2022  This Visit's Progress: On track  Priority: High  Note:   Current Barriers:  Unable to achieve control of lipids   Pharmacist Clinical Goal(s):  Patient will achieve control of LDL as evidenced by labs through collaboration with PharmD and provider.   Interventions: 1:1 collaboration with Lavera Guise, MD regarding development and update of comprehensive plan of care as evidenced by provider attestation and co-signature Inter-disciplinary care team collaboration (see longitudinal plan of care) Comprehensive medication review performed; medication list updated in electronic medical  record  Hypertension (BP goal <140/90) -Controlled -Current treatment: Amlodipine 2.5mg  daily -Medications previously tried: bisoprolol/HCTZ  -Current home readings: normal at homne per patient -Current dietary habits: pretty good, avoiding salt currently -Current exercise habits: none -Denies hypotensive/hypertensive symptoms -Educated on BP goals and benefits of medications for prevention of heart attack, stroke and kidney damage; Daily salt intake goal < 2300 mg; Importance of home blood pressure monitoring; Symptoms of hypotension and importance of maintaining adequate hydration; -Counseled to monitor BP at home daily, document, and provide log at future appointments -Recommended to continue current medication Monitor at home, if BP continues to be elevated as it was during last two MD visits, contact providers.  Hyperlipidemia: (LDL goal < 100) -Uncontrolled -Current treatment: Simvastatin 20mg  daily - AM -Medications previously tried: none noted  -Current exercise habits: none -Educated on Cholesterol goals;  Benefits of statin for ASCVD risk reduction; Importance of limiting foods high in cholesterol; -Most recent LDL was elevated, she has not tried any other statin  -Recommended to continue current medication Has active labs to be drawn prior to next OV - current ASCVD risk is 20.8% would recommend high intensity statin based off of that - would refrain from making changes at this time  pending upcoming labs and make decision at that time.  Anxiety (Goal: Minimize symptoms) -Controlled -Current treatment: Alprazolam 0.25mg  prn -Medications previously tried/failed: none noted -PHQ9:  PHQ9 SCORE ONLY 06/19/2021 04/21/2021 12/29/2020  PHQ-9 Total Score 0 0 0  -Takes occasionally in mornings when she has "shaky" feeling -Educated on Benefits of medication for symptom control -Recommended to continue current medication  GERD (Goal: Minimze symptoms) -Controlled -Current  treatment  Pantoprazole 40mg  -Medications previously tried: none noted -has been taking for some time, no current symptoms -Would consider step down therapy due to length of therapy.  -Recommended to continue current medication Consider step down therapy in future  Insomnia (Goal: Improve sleep quality) -Controlled -Current treatment  Zolpidem 10mg  prn -Medications previously tried: none noted -Takes occasionally when she has problems falling asleep  -Recommended to continue current medication  Hypothyroidism (Goal: Maintain TSh) -Controlled -Current treatment  Levothryoxine 70mcg daily -Medications previously tried: none noted -TSH is WNL, patient is adherent to med and takes appropriately  -Recommended to continue current medication   Patient Goals/Self-Care Activities Patient will:  - take medications as prescribed focus on medication adherence by pill packs check blood pressure a few times per week, document, and provide at future appointments  Follow Up Plan: The care management team will reach out to the patient again over the next 120 days.       Joy Carpenter was given information about Chronic Care Management services today including:  CCM service includes personalized support from designated clinical staff supervised by her physician, including individualized plan of care and coordination with other care providers 24/7 contact phone numbers for assistance for urgent and routine care needs. Standard insurance, coinsurance, copays and deductibles apply for chronic care management only during months in which we provide at least 20 minutes of these services. Most insurances cover these services at 100%, however patients may be responsible for any copay, coinsurance and/or deductible if applicable. This service may help you avoid the need for more expensive face-to-face services. Only one practitioner may furnish and bill the service in a calendar month. The patient may  stop CCM services at any time (effective at the end of the month) by phone call to the office staff.  Patient agreed to services and verbal consent obtained.   The patient verbalized understanding of instructions, educational materials, and care plan provided today and agreed to receive a mailed copy of patient instructions, educational materials, and care plan.  Telephone follow up appointment with pharmacy team member scheduled for: 4 months  Edythe Clarity, [place]

## 2021-07-19 NOTE — Telephone Encounter (Signed)
yes

## 2021-07-19 NOTE — Telephone Encounter (Signed)
Pt aware.

## 2021-07-19 NOTE — Telephone Encounter (Signed)
Joy Carpenter calling from .[facility] KitchenMarland KitchenMarland KitchenMedication Review; hydrocodone-acetaminophen 5-325mg ;  Needs information completed.  [phone]  Called and spoke c [name] and adv her that caller spoke so fast I wasn't able to understand the message.  She states the call is about a PA they had faxed; have we received it; has it been sent back?

## 2021-07-19 NOTE — Telephone Encounter (Signed)
Pt calling; had D&C yesterday; can she go back to work on Sunday?  [phone]

## 2021-07-20 ENCOUNTER — Other Ambulatory Visit: Payer: Self-pay | Admitting: Obstetrics and Gynecology

## 2021-07-20 LAB — SURGICAL PATHOLOGY

## 2021-07-20 MED ORDER — DOXYCYCLINE HYCLATE 100 MG PO CAPS: 100.0000 mg | ORAL_CAPSULE | Freq: Two times a day (BID) | ORAL | 0 refills | Status: AC

## 2021-07-20 NOTE — Progress Notes (Signed)
Rx doxycyline 2 week course for chronic/acute endometritis

## 2021-07-21 LAB — AEROBIC/ANAEROBIC CULTURE W GRAM STAIN (SURGICAL/DEEP WOUND): Gram Stain: NONE SEEN

## 2021-07-23 ENCOUNTER — Other Ambulatory Visit: Payer: Self-pay

## 2021-07-23 DIAGNOSIS — E039 Hypothyroidism, unspecified: Secondary | ICD-10-CM

## 2021-07-23 DIAGNOSIS — R300 Dysuria: Secondary | ICD-10-CM

## 2021-07-23 DIAGNOSIS — R5383 Other fatigue: Secondary | ICD-10-CM

## 2021-07-23 DIAGNOSIS — Z79899 Other long term (current) drug therapy: Secondary | ICD-10-CM

## 2021-07-23 DIAGNOSIS — F411 Generalized anxiety disorder: Secondary | ICD-10-CM

## 2021-07-23 DIAGNOSIS — I700 Atherosclerosis of aorta: Secondary | ICD-10-CM

## 2021-07-23 DIAGNOSIS — R109 Unspecified abdominal pain: Secondary | ICD-10-CM

## 2021-07-23 DIAGNOSIS — I10 Essential (primary) hypertension: Secondary | ICD-10-CM

## 2021-07-23 DIAGNOSIS — Z1239 Encounter for other screening for malignant neoplasm of breast: Secondary | ICD-10-CM

## 2021-07-23 DIAGNOSIS — K219 Gastro-esophageal reflux disease without esophagitis: Secondary | ICD-10-CM

## 2021-07-23 DIAGNOSIS — Z0001 Encounter for general adult medical examination with abnormal findings: Secondary | ICD-10-CM

## 2021-07-23 DIAGNOSIS — E559 Vitamin D deficiency, unspecified: Secondary | ICD-10-CM

## 2021-07-23 DIAGNOSIS — Z124 Encounter for screening for malignant neoplasm of cervix: Secondary | ICD-10-CM

## 2021-07-23 DIAGNOSIS — F5101 Primary insomnia: Secondary | ICD-10-CM

## 2021-07-23 MED ORDER — LEVOTHYROXINE SODIUM 50 MCG PO TABS
ORAL_TABLET | ORAL | 1 refills | Status: DC
Start: 2021-07-23 — End: 2021-10-11

## 2021-07-23 MED ORDER — SIMVASTATIN 20 MG PO TABS: ORAL_TABLET | ORAL | 1 refills | Status: DC

## 2021-07-23 MED ORDER — AMLODIPINE BESYLATE 2.5 MG PO TABS: 2.5000 mg | ORAL_TABLET | Freq: Every day | ORAL | 1 refills | Status: DC

## 2021-07-23 MED ORDER — PANTOPRAZOLE SODIUM 40 MG PO TBEC: 40.0000 mg | DELAYED_RELEASE_TABLET | Freq: Every day | ORAL | 1 refills | Status: DC

## 2021-07-23 NOTE — Progress Notes (Signed)
Obstetrics & Gynecology Surgery H&P    Chief Complaint: Scheduled Surgery   History of Present Illness: Patient is a 70 y.o. G2P0 presenting for scheduled hysteroscopy D&C, for the treatment or further evaluation of abnormal endometrial ultrasound and postmenopausal bleeding with cervical stenosis .    Prior Treatments prior to proceeding with surgery include: attempt at in office endometrial biopsy   Preoperative Pap: 05/15/2021 NILM HPV negative Preoperative Endometrial biopsy: attempted 06/13/21 but unsuccessful secondary to cervical stenosis Preoperative Ultrasound: 06/08/2021 thickened and complex appearing endometrial stripe  Review of Systems:10 point review of systems  Past Medical History:  Patient Active Problem List   Diagnosis Date Noted   Encounter for screening mammogram for malignant neoplasm of breast 09/02/2020   Hernia, hiatal 06/15/2020   Thoracoabdominal aortic aneurysm (TAAA) without rupture 04/19/2020   Urinary tract infection with hematuria 01/23/2020   Abdominal pain 01/23/2020   Acquired hypothyroidism 10/11/2019   Need for vaccination against Streptococcus pneumoniae using pneumococcal conjugate vaccine 13 10/11/2019   Laceration of right lower extremity 10/11/2019   Right upper quadrant pain 06/17/2019   Gallbladder sludge 06/17/2019   Other specified diseases of gallbladder 06/17/2019   Gastroesophageal reflux disease without esophagitis 04/30/2019   Vitamin D deficiency 04/30/2019   Other fatigue 04/30/2019   Primary generalized (osteo)arthritis 12/26/2018   Atopic dermatitis 12/26/2018   Acute upper respiratory infection 08/27/2018   Carpal tunnel syndrome on both sides 08/27/2018   Chronic idiopathic constipation 05/18/2018   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/2018   Dysuria 05/18/2018   Left sided abdominal pain 01/04/2018   Primary insomnia 01/04/2018   Generalized anxiety disorder 01/04/2018   Tobacco use disorder 07/31/2016   Hyperlipidemia  07/31/2016   Atherosclerosis of native arteries of extremity with intermittent claudication ([place]) 07/31/2016   Abdominal aortic aneurysm (AAA) without rupture 07/31/2016   Abdominal pain, epigastric    Abnormal findings-gastrointestinal tract    Gastritis    Other specified diseases of esophagus    Encounter for long-term (current) use of medications     Past Surgical History:  Past Surgical History:  Procedure Laterality Date   COLONOSCOPY     COLONOSCOPY WITH PROPOFOL N/A 06/14/2016   Procedure: COLONOSCOPY WITH PROPOFOL;  Surgeon: Lucilla Lame, MD;  Location: [facility];  Service: Endoscopy;  Laterality: N/A;   ESOPHAGOGASTRODUODENOSCOPY (EGD) WITH PROPOFOL N/A 06/14/2016   Procedure: ESOPHAGOGASTRODUODENOSCOPY (EGD) WITH PROPOFOL;  Surgeon: Lucilla Lame, MD;  Location: [facility];  Service: Endoscopy;  Laterality: N/A;   HYSTEROSCOPY WITH D & C N/A 07/18/2021   Procedure: DILATATION AND CURETTAGE /HYSTEROSCOPY;  Surgeon: Malachy Mood, MD;  Location: [facility];  Service: Gynecology;  Laterality: N/A;   THORACIC AORTIC ANEURYSM REPAIR N/A 11/16/2020   Procedure: 4V FEVAR (CA, SMA, RRA, LRA) WITH PROXIMAL TEVAR EXTENSION WITH BILATERAL ILIAC LIMB EXTENSIONS; Location: [facility]; Surgeon; Vinnie Level, MD   TUBAL LIGATION      Family History:  Family History  Problem Relation Age of Onset   Leukemia Mother    Aneurysm Other     Social History:  Social History   Socioeconomic History   Marital status: Divorced    Spouse name: Not on file   Number of children: Not on file   Years of education: Not on file   Highest education level: Not on file  Occupational History   Not on file  Tobacco Use   Smoking status: Every Day    Packs/day: 1.00    Years: 30.00    Pack years:  30.00    Types: Cigarettes   Smokeless tobacco: Never  Vaping Use   Vaping Use: Never used  Substance and Sexual Activity   Alcohol use: No   Drug use: Never   Sexual activity: Not  Currently    Birth control/protection: Post-menopausal  Other Topics Concern   Not on file  Social History Narrative   Not on file   Social Determinants of Health   Financial Resource Strain: Not on file  Food Insecurity: Not on file  Transportation Needs: Not on file  Physical Activity: Not on file  Stress: Not on file  Social Connections: Not on file  Intimate Partner Violence: Not on file    Allergies:  No Known Allergies  Medications: Prior to Admission medications   Medication Sig Start Date End Date Taking? Authorizing Provider  ALPRAZolam (XANAX) 0.25 MG tablet Take 1 tablet (0.25 mg total) by mouth daily as needed for anxiety. for anxiety 06/19/21   McDonough, Si Gaul, PA-C  amLODipine (NORVASC) 2.5 MG tablet Take 1 tablet (2.5 mg total) by mouth daily. For htn 03/08/21   Lavera Guise, MD  aspirin 81 MG chewable tablet Chew by mouth. 11/21/20   [provider]  doxycycline (VIBRAMYCIN) 100 MG capsule Take 1 capsule (100 mg total) by mouth 2 (two) times daily for 14 days. 07/20/21 08/03/21  Malachy Mood, MD  HYDROcodone-acetaminophen (NORCO/VICODIN) 5-325 MG tablet Take 1 tablet by mouth every 6 (six) hours as needed. 07/18/21   Malachy Mood, MD  levothyroxine (SYNTHROID) 50 MCG tablet TAKE 1 TABLET BY MOUTH EVERY MORNING 30 MINUTES PRIOR TO EATING FOOD 04/21/21   McDonough, Si Gaul, PA-C  pantoprazole (PROTONIX) 40 MG tablet TAKE 1 TABLET BY MOUTH EVERY DAY 06/30/21   McDonough, Si Gaul, PA-C  simvastatin (ZOCOR) 20 MG tablet Take by mouth. 12/05/20   [provider]  traMADol-acetaminophen (ULTRACET) 37.5-325 MG tablet Take 1 tablet by mouth 2 (two) times daily as needed. 06/05/21   McDonough, Si Gaul, PA-C  zolpidem (AMBIEN) 10 MG tablet Take 1 tablet (10 mg total) by mouth at bedtime. 12/29/20   Luiz Ochoa, NP    Physical Exam Vitals: Blood pressure (!) 145/77, height 5\' 6"  (1.676 m), weight 132 lb (59.9 kg). General: NAD HEENT:  normocephalic, anicteric Pulmonary: No increased work of breathing Cardiovascular: RRR, distal pulses 2+ Abdomen: soft, non-tender, non-distended Genitourinary: deferred Extremities: no edema, erythema, or tenderness Neurologic: Grossly intact Psychiatric: mood appropriate, affect full  Imaging No results found.  Assessment: 70 y.o. G2P0 presenting for scheduled with postmenopausal bleeding and thickened complex endometrial stripe  Plan: 1) I have discussed with the patient the indications for the procedure. Included in the discussion were the options of therapy, as wall as their individual risks, benefits, and complications. Ample time was given to answer all questions.   In office pipelle biopsy generally provides comparable results to [facility], however this sampling modality may miss focal lesions if these were previously documented on ultrasound.  It is because of the potential to miss focal lesions that hysteroscopy D&C is also warranted in patient with continued postmenopausal bleeding that is not self limited regardless of prior in office biopsy results or ultrasound findings.  She understands that the risk of continued observation include worsening bleeding or worsening of any underlying pathology.  The choices include: 1. Doing nothing but following her symptoms 2. Attempts at hormonal manipulation with either BCP or Depo-Provera for premenopausal patients with no concern for focal lesion or endometrial pathology  3. D&C/hysteroscopy. 4. Endometrial ablation via Novasure or other techniques for premenopausal patients with no concern for focal lesion or endometrial pathology  5. As final resort, hysterectomy. After consideration of her history and findings, mutual decision has been made to proceed with D+C/hysteroscopy. While the incidence is low, the risks from this surgery include, but are not limited to, the risks of anesthesia, hemorrhage, infection, perforation, and injury to adjacent  structures including bowel, bladder and blood vessels.    2) Routine postoperative instructions were reviewed with the patient and her family in detail today including the expected length of recovery and likely postoperative course.  The patient concurred with the proposed plan, giving informed written consent for the surgery today.  Patient instructed on the importance of being NPO after midnight prior to her procedure.  If warranted preoperative prophylactic antibiotics and SCDs ordered on call to the OR to meet SCIP guidelines and adhere to recommendation laid forth in [place] Number 104 May 2009  "Antibiotic Prophylaxis for Gynecologic Procedures".     Malachy Mood, MD, [facility], [place]

## 2021-07-31 ENCOUNTER — Other Ambulatory Visit: Payer: Self-pay

## 2021-07-31 ENCOUNTER — Encounter: Payer: Self-pay | Admitting: Obstetrics and Gynecology

## 2021-07-31 ENCOUNTER — Ambulatory Visit: Payer: Medicare HMO | Admitting: Obstetrics and Gynecology

## 2021-07-31 VITALS — BP 124/76 | Ht 66.0 in | Wt 130.0 lb

## 2021-07-31 DIAGNOSIS — Z4889 Encounter for other specified surgical aftercare: Secondary | ICD-10-CM

## 2021-07-31 DIAGNOSIS — N719 Inflammatory disease of uterus, unspecified: Secondary | ICD-10-CM

## 2021-08-01 ENCOUNTER — Telehealth: Payer: Self-pay | Admitting: Pharmacist

## 2021-08-01 MED ORDER — HYDROCODONE-ACETAMINOPHEN 5-325 MG PO TABS: 1.0000 | ORAL_TABLET | Freq: Four times a day (QID) | ORAL | 0 refills | Status: DC | PRN

## 2021-08-01 NOTE — Progress Notes (Signed)
Postoperative Follow-up Patient presents post op from hysteroscopy, D&C 1weeks ago for abnormal uterine bleeding., pelvic pain, cervical stenosis  Subjective: Patient reports some improvement in her preop symptoms. Eating a regular diet without difficulty. Pain is controlled with current analgesics. Medications being used: prescription NSAID's including ibuprofen (Motrin) and narcotic analgesics including oxycodone/acetaminophen (Percocet, Tylox).  Activity: normal activities of daily living.  Objective: Blood pressure 124/76, height 5\' 6"  (1.676 m), weight 130 lb (59 kg).  General: NAD Pulmonary: no increased work of breathing Abdomen: soft, non-tender, non-distended, incision Extremities: no edema Neurologic: normal gait   Admission on 07/18/2021, Discharged on 07/18/2021  Component Date Value Ref Range Status   SURGICAL PATHOLOGY 07/18/2021    Final-Edited                   Value:SURGICAL PATHOLOGY CASE: [id number] PATIENT: Mel Almond Surgical Pathology Report     Specimen Submitted: A. Endometrial curettings  Clinical History: Abnormal endometrial ultrasound      DIAGNOSIS: A. ENDOMETRIUM; CURETTAGE: - ACUTE AND CHRONIC ENDOMETRITIS. - NEGATIVE FOR ATYPIA / EIN AND MALIGNANCY.   GROSS DESCRIPTION: A. Labeled: Endometrial curettings Received: Fresh Collection time: 2:53 PM on 07/18/2021 Placed into formalin time: 3:42 PM on 07/18/2021 Tissue fragment(s): Multiple Size: Aggregate, 2.2 x 0.4 x 0.2 cm Description: Received in a white mesh collection bag are fragments of white soft tissue and blood clot. Entirely submitted in 1 cassette.  RB 07/19/2021  Final Diagnosis performed by Betsy Pries, MD.   Electronically signed 07/20/2021 11:13:48AM The electronic signature indicates that the named Attending Pathologist has evaluated the specimen Technical component performed at [facility], [address], [place], [place] [address]                          [facility]: [phone] Dir: Rush Farmer, MD, MMM  Professional component performed at [facility], [facility], [place], [place], [place] [address] Lab: [phone] Dir: Kathi Simpers, MD    Specimen Description 07/18/2021    Final                   Value:ENDOCERVICAL Performed at [facility], [address], [place], [place] [address]    Special Requests 07/18/2021    Final                   Value:NONE Performed at [facility], [place] [place],  [address]    Gram Stain 07/18/2021    Final                   Value:NO SQUAMOUS EPITHELIAL CELLS SEEN FEW WBC SEEN FEW GRAM NEGATIVE RODS FEW GRAM POSITIVE COCCI    Culture 07/18/2021    Final                   Value:ABUNDANT BACTEROIDES FRAGILIS BETA LACTAMASE POSITIVE Performed at [facility], [place] [address] [place],  [address]    Report Status 07/18/2021 07/21/2021 FINAL   Final    Assessment: 70 y.o. s/p hysteroscopy D&C stable  Plan: Patient has done well after surgery with no apparent complications.  I have discussed the post-operative course to date, and the expected progress moving forward.  The patient understands what complications to be concerned about.  I will see the patient in routine follow up, or sooner if needed.    Activity plan: No restriction.  Still some  occasional cramping refill vicodin Rx  Finish out Doxycyline course for pathology showing chronic endometritis  Return in about 4 weeks (around 08/28/2021), or 6 week postop.    Malachy Mood, MD, [facility], [facility] 08/01/2021, 7:06 AM

## 2021-08-01 NOTE — Progress Notes (Signed)
    Chronic Care Management Pharmacy Assistant   Name: Joy Carpenter  MRN: [id number] DOB: 1951/08/10  Reason for Encounter: CCM Care Plan  Medications: Outpatient Encounter Medications as of 08/01/2021  Medication Sig   ALPRAZolam (XANAX) 0.25 MG tablet Take 1 tablet (0.25 mg total) by mouth daily as needed for anxiety. for anxiety   amLODipine (NORVASC) 2.5 MG tablet Take 1 tablet (2.5 mg total) by mouth daily. For htn   aspirin 81 MG chewable tablet Chew by mouth.   doxycycline (VIBRAMYCIN) 100 MG capsule Take 1 capsule (100 mg total) by mouth 2 (two) times daily for 14 days.   HYDROcodone-acetaminophen (NORCO/VICODIN) 5-325 MG tablet Take 1 tablet by mouth every 6 (six) hours as needed.   levothyroxine (SYNTHROID) 50 MCG tablet TAKE 1 TABLET BY MOUTH EVERY MORNING 30 MINUTES PRIOR TO EATING FOOD   pantoprazole (PROTONIX) 40 MG tablet Take 1 tablet (40 mg total) by mouth daily.   simvastatin (ZOCOR) 20 MG tablet Take 1 tablet by mouth daily   traMADol-acetaminophen (ULTRACET) 37.5-325 MG tablet Take 1 tablet by mouth 2 (two) times daily as needed.   zolpidem (AMBIEN) 10 MG tablet Take 1 tablet (10 mg total) by mouth at bedtime.   No facility-administered encounter medications on file as of 08/01/2021.   Reviewed the patients initial visit reinsured it was completed per the pharmacist Leata Mouse request. Printed the CCM care plan. Mailed the patient CCM care plan to their most recent address on file.  Follow-Up:Pharmacist Review  Charlann Lange, [place] Pharmacist Assistant [phone]

## 2021-08-03 DIAGNOSIS — R935 Abnormal findings on diagnostic imaging of other abdominal regions, including retroperitoneum: Secondary | ICD-10-CM

## 2021-08-03 DIAGNOSIS — N950 Postmenopausal bleeding: Secondary | ICD-10-CM

## 2021-08-07 DIAGNOSIS — K219 Gastro-esophageal reflux disease without esophagitis: Secondary | ICD-10-CM | POA: Diagnosis not present

## 2021-08-07 DIAGNOSIS — E785 Hyperlipidemia, unspecified: Secondary | ICD-10-CM | POA: Diagnosis not present

## 2021-08-07 DIAGNOSIS — E039 Hypothyroidism, unspecified: Secondary | ICD-10-CM | POA: Diagnosis not present

## 2021-08-14 ENCOUNTER — Other Ambulatory Visit: Payer: Self-pay | Admitting: Physician Assistant

## 2021-08-14 DIAGNOSIS — F411 Generalized anxiety disorder: Secondary | ICD-10-CM

## 2021-08-24 ENCOUNTER — Encounter: Payer: Medicare HMO | Admitting: Dermatology

## 2021-08-30 ENCOUNTER — Other Ambulatory Visit: Payer: Self-pay | Admitting: Internal Medicine

## 2021-08-30 DIAGNOSIS — I10 Essential (primary) hypertension: Secondary | ICD-10-CM

## 2021-09-04 ENCOUNTER — Ambulatory Visit: Payer: Medicare HMO | Admitting: Obstetrics and Gynecology

## 2021-09-13 ENCOUNTER — Other Ambulatory Visit: Payer: Self-pay

## 2021-09-13 ENCOUNTER — Encounter: Payer: Self-pay | Admitting: Obstetrics and Gynecology

## 2021-09-13 ENCOUNTER — Ambulatory Visit (INDEPENDENT_AMBULATORY_CARE_PROVIDER_SITE_OTHER): Payer: Medicare HMO | Admitting: Obstetrics and Gynecology

## 2021-09-13 VITALS — BP 122/68 | Ht 66.0 in | Wt 130.0 lb

## 2021-09-13 DIAGNOSIS — Z4889 Encounter for other specified surgical aftercare: Secondary | ICD-10-CM

## 2021-09-13 NOTE — Progress Notes (Signed)
Postoperative Follow-up Patient presents post op from [facility], hysteroscopy 6weeks ago for  PMB .  Subjective: Patient reports marked improvement in her preop symptoms. Eating a regular diet without difficulty. The patient is not having any pain.  Activity: normal activities of daily living.  Objective: Blood pressure 122/68, height 5\' 6"  (1.676 m), weight 130 lb (59 kg).  General: NAD Pulmonary: no increased work of breathing Abdomen: soft, non-tender, non-distended, incision(s) D/C/I GU: normal external female genitalia normal cervix, no CMT, uterus normal in shape and contour, no adnexal tenderness or masses Extremities: no edema Neurologic: normal gait   Admission on 07/18/2021, Discharged on 07/18/2021  Component Date Value Ref Range Status   SURGICAL PATHOLOGY 07/18/2021    Final-Edited                   Value:SURGICAL PATHOLOGY CASE: [id number] PATIENT: Joy Carpenter Surgical Pathology Report     Specimen Submitted: A. Endometrial curettings  Clinical History: Abnormal endometrial ultrasound      DIAGNOSIS: A. ENDOMETRIUM; CURETTAGE: - ACUTE AND CHRONIC ENDOMETRITIS. - NEGATIVE FOR ATYPIA / EIN AND MALIGNANCY.   GROSS DESCRIPTION: A. Labeled: Endometrial curettings Received: Fresh Collection time: 2:53 PM on 07/18/2021 Placed into formalin time: 3:42 PM on 07/18/2021 Tissue fragment(s): Multiple Size: Aggregate, 2.2 x 0.4 x 0.2 cm Description: Received in a white mesh collection bag are fragments of white soft tissue and blood clot. Entirely submitted in 1 cassette.  RB 07/19/2021  Final Diagnosis performed by Betsy Pries, MD.   Electronically signed 07/20/2021 11:13:48AM The electronic signature indicates that the named Attending Pathologist has evaluated the specimen Technical component performed at [facility], [address], [place], [place] [address]                         [place]: [phone] Dir: Rush Farmer, MD, MMM  Professional  component performed at [facility], [facility], [place], [place], [place] [address] Lab: [phone] Dir: Kathi Simpers, MD    Specimen Description 07/18/2021    Final                   Value:ENDOCERVICAL Performed at [facility], [address], [place], [place] [address]    Special Requests 07/18/2021    Final                   Value:NONE Performed at [facility], [address] [place], [place] [address]    Gram Stain 07/18/2021    Final                   Value:NO SQUAMOUS EPITHELIAL CELLS SEEN FEW WBC SEEN FEW GRAM NEGATIVE RODS FEW GRAM POSITIVE COCCI    Culture 07/18/2021    Final                   Value:ABUNDANT BACTEROIDES FRAGILIS BETA LACTAMASE POSITIVE Performed at [facility], [place] [address] [place], [place] [address]    Report Status 07/18/2021 07/21/2021 FINAL   Final    Assessment: 70 y.o. s/p hysteroscopy, D&C stable  Plan: Patient has done well after surgery with no apparent complications.  I have discussed the post-operative course to date, and the expected progress moving forward.  The patient understands what complications to be concerned about.  I will see the patient in routine follow up, or sooner if needed.    Activity plan: No restriction.  Malachy Mood, MD, [facility], [facility] 09/13/2021, 11:40 AM

## 2021-09-14 DIAGNOSIS — I700 Atherosclerosis of aorta: Secondary | ICD-10-CM | POA: Diagnosis not present

## 2021-09-14 DIAGNOSIS — R5383 Other fatigue: Secondary | ICD-10-CM | POA: Diagnosis not present

## 2021-09-14 DIAGNOSIS — E039 Hypothyroidism, unspecified: Secondary | ICD-10-CM | POA: Diagnosis not present

## 2021-09-15 LAB — LIPID PANEL WITH LDL/HDL RATIO
Cholesterol, Total: 199 mg/dL (ref 100–199)
HDL: 54 mg/dL (ref >39)
LDL Chol Calc (NIH): 127 mg/dL — ABNORMAL HIGH (ref 0–99)
LDL/HDL Ratio: 2.4 ratio (ref 0.0–3.2)
Triglycerides: 101 mg/dL (ref 0–149)
VLDL Cholesterol Cal: 18 mg/dL (ref 5–40)

## 2021-09-15 LAB — CBC WITH DIFFERENTIAL/PLATELET
Basophils Absolute: 0.1 10*3/uL (ref 0.0–0.2)
Basos: 2 %
EOS (ABSOLUTE): 0.1 10*3/uL (ref 0.0–0.4)
Eos: 1 %
Hematocrit: 39.5 % (ref 34.0–46.6)
Hemoglobin: 13.5 g/dL (ref 11.1–15.9)
Immature Grans (Abs): 0 10*3/uL (ref 0.0–0.1)
Immature Granulocytes: 0 %
Lymphocytes Absolute: 2.1 10*3/uL (ref 0.7–3.1)
Lymphs: 27 %
MCH: 33.8 pg — ABNORMAL HIGH (ref 26.6–33.0)
MCHC: 34.2 g/dL (ref 31.5–35.7)
MCV: 99 fL — ABNORMAL HIGH (ref 79–97)
Monocytes Absolute: 0.4 10*3/uL (ref 0.1–0.9)
Monocytes: 5 %
Neutrophils Absolute: 5.1 10*3/uL (ref 1.4–7.0)
Neutrophils: 65 %
Platelets: 245 10*3/uL (ref 150–450)
RBC: 4 x10E6/uL (ref 3.77–5.28)
RDW: 12.3 % (ref 11.7–15.4)
WBC: 7.9 10*3/uL (ref 3.4–10.8)

## 2021-09-15 LAB — COMPREHENSIVE METABOLIC PANEL
ALT: 6 IU/L (ref 0–32)
AST: 13 IU/L (ref 0–40)
Albumin/Globulin Ratio: 1.8 (ref 1.2–2.2)
Albumin: 4.3 g/dL (ref 3.8–4.8)
Alkaline Phosphatase: 140 IU/L — ABNORMAL HIGH (ref 44–121)
BUN/Creatinine Ratio: 15 (ref 12–28)
BUN: 17 mg/dL (ref 8–27)
Bilirubin Total: <0.2 mg/dL (ref 0.0–1.2)
CO2: 20 mmol/L (ref 20–29)
Calcium: 8.8 mg/dL (ref 8.7–10.3)
Chloride: 106 mmol/L (ref 96–106)
Creatinine, Ser: 1.14 mg/dL — ABNORMAL HIGH (ref 0.57–1.00)
Globulin, Total: 2.4 g/dL (ref 1.5–4.5)
Glucose: 142 mg/dL — ABNORMAL HIGH (ref 70–99)
Potassium: 4.7 mmol/L (ref 3.5–5.2)
Sodium: 143 mmol/L (ref 134–144)
Total Protein: 6.7 g/dL (ref 6.0–8.5)
eGFR: 52 mL/min/{1.73_m2} — ABNORMAL LOW (ref >59)

## 2021-09-15 LAB — TSH+FREE T4
Free T4: 0.98 ng/dL (ref 0.82–1.77)
TSH: 0.778 u[IU]/mL (ref 0.450–4.500)

## 2021-09-18 ENCOUNTER — Other Ambulatory Visit: Payer: Self-pay

## 2021-09-18 ENCOUNTER — Other Ambulatory Visit: Payer: Self-pay | Admitting: Physician Assistant

## 2021-09-18 ENCOUNTER — Other Ambulatory Visit: Payer: Self-pay | Admitting: Nurse Practitioner

## 2021-09-18 ENCOUNTER — Ambulatory Visit (INDEPENDENT_AMBULATORY_CARE_PROVIDER_SITE_OTHER): Payer: Medicare HMO | Admitting: Physician Assistant

## 2021-09-18 ENCOUNTER — Encounter: Payer: Self-pay | Admitting: Physician Assistant

## 2021-09-18 DIAGNOSIS — K449 Diaphragmatic hernia without obstruction or gangrene: Secondary | ICD-10-CM | POA: Diagnosis not present

## 2021-09-18 DIAGNOSIS — E785 Hyperlipidemia, unspecified: Secondary | ICD-10-CM

## 2021-09-18 DIAGNOSIS — F411 Generalized anxiety disorder: Secondary | ICD-10-CM

## 2021-09-18 DIAGNOSIS — J0110 Acute frontal sinusitis, unspecified: Secondary | ICD-10-CM

## 2021-09-18 DIAGNOSIS — R109 Unspecified abdominal pain: Secondary | ICD-10-CM | POA: Diagnosis not present

## 2021-09-18 DIAGNOSIS — F5101 Primary insomnia: Secondary | ICD-10-CM | POA: Diagnosis not present

## 2021-09-18 DIAGNOSIS — I700 Atherosclerosis of aorta: Secondary | ICD-10-CM

## 2021-09-18 MED ORDER — ALPRAZOLAM 0.25 MG PO TABS: 0.2500 mg | ORAL_TABLET | Freq: Every day | ORAL | 1 refills | Status: DC | PRN

## 2021-09-18 MED ORDER — AMOXICILLIN-POT CLAVULANATE 875-125 MG PO TABS: 1.0000 | ORAL_TABLET | Freq: Two times a day (BID) | ORAL | 0 refills | Status: DC

## 2021-09-18 MED ORDER — ZOLPIDEM TARTRATE 10 MG PO TABS: 10.0000 mg | ORAL_TABLET | Freq: Every day | ORAL | 1 refills | Status: DC

## 2021-09-18 NOTE — Progress Notes (Signed)
[facility] [place], [place] [address]  Internal MEDICINE  Office Visit Note  Patient Name: Joy Carpenter  [id number]  [id number]  Date of Service: 09/24/2021  Chief Complaint  Patient presents with   Medication Refill    3 month f/u   lab review   Sinusitis    Covid test was negative a few days ago, sinus pressure and congestion, no fever.  Started 4-5 days ago.  Taking OTC mucinex    HPI Pt is here for routine follow up -Cough and congestion for the past 4 days. Taking mucinex OTC.  -Had her vascular follow up last week and again do no think her abdominal pain is related to surgery -Not fasting for labs--elevated glucose and cholesterol; reviewed abnormal kidney function as well but improved from last year -OBGYN did D&C and discharged stopped but still has some LLQ pain, does report being told she had a hernia but they focused on aneurysm repair and never addressed hernia even though it appeared worse on later imaging. -Due to ongoing pain and evidence of hernia on imaging will go ahead and refer to [facility] for evaluation -Does also mention her BF was recently diagnosed with metastatic cancer and has appt with oncology this week. This has been stressful on her  Current Medication: Outpatient Encounter Medications as of 09/18/2021  Medication Sig   amLODipine (NORVASC) 2.5 MG tablet TAKE 1 TABLET (2.5 MG TOTAL) BY MOUTH DAILY. FOR HYPERTENSION   amoxicillin-clavulanate (AUGMENTIN) 875-125 MG tablet Take 1 tablet by mouth 2 (two) times daily.   aspirin 81 MG chewable tablet Chew by mouth.   levothyroxine (SYNTHROID) 50 MCG tablet TAKE 1 TABLET BY MOUTH EVERY MORNING 30 MINUTES PRIOR TO EATING FOOD   pantoprazole (PROTONIX) 40 MG tablet Take 1 tablet (40 mg total) by mouth daily.   traMADol-acetaminophen (ULTRACET) 37.5-325 MG tablet Take 1 tablet by mouth 2 (two) times daily as needed.   [DISCONTINUED] ALPRAZolam (XANAX) 0.25 MG tablet TAKE 1 TABLET (0.25  MG TOTAL) BY MOUTH DAILY AS NEEDED FOR ANXIETY. FOR ANXIETY   [DISCONTINUED] simvastatin (ZOCOR) 20 MG tablet Take 1 tablet by mouth daily   [DISCONTINUED] zolpidem (AMBIEN) 10 MG tablet Take 1 tablet (10 mg total) by mouth at bedtime.   [START ON 10/14/2021] ALPRAZolam (XANAX) 0.25 MG tablet Take 1 tablet (0.25 mg total) by mouth daily as needed for anxiety. for anxiety   zolpidem (AMBIEN) 10 MG tablet Take 1 tablet (10 mg total) by mouth at bedtime.   [DISCONTINUED] HYDROcodone-acetaminophen (NORCO/VICODIN) 5-325 MG tablet Take 1 tablet by mouth every 6 (six) hours as needed. (Patient not taking: Reported on 09/18/2021)   No facility-administered encounter medications on file as of 09/18/2021.    Surgical History: Past Surgical History:  Procedure Laterality Date   COLONOSCOPY     COLONOSCOPY WITH PROPOFOL N/A 06/14/2016   Procedure: COLONOSCOPY WITH PROPOFOL;  Surgeon: Lucilla Lame, MD;  Location: [facility];  Service: Endoscopy;  Laterality: N/A;   ESOPHAGOGASTRODUODENOSCOPY (EGD) WITH PROPOFOL N/A 06/14/2016   Procedure: ESOPHAGOGASTRODUODENOSCOPY (EGD) WITH PROPOFOL;  Surgeon: Lucilla Lame, MD;  Location: [facility];  Service: Endoscopy;  Laterality: N/A;   HYSTEROSCOPY WITH D & C N/A 07/18/2021   Procedure: DILATATION AND CURETTAGE /HYSTEROSCOPY;  Surgeon: Malachy Mood, MD;  Location: [facility];  Service: Gynecology;  Laterality: N/A;   THORACIC AORTIC ANEURYSM REPAIR N/A 11/16/2020   Procedure: 4V FEVAR (CA, SMA, RRA, LRA) WITH PROXIMAL TEVAR EXTENSION WITH BILATERAL ILIAC LIMB  EXTENSIONS; Location: [facility]; Surgeon; Vinnie Level, MD   TUBAL LIGATION      Medical History: Past Medical History:  Diagnosis Date   Actinic keratosis 03/28/2021   right upper arm, bx proven   Anxiety    Carpal tunnel syndrome 2019   Depression    Emphysema of lung ([place])    "mild"   GERD (gastroesophageal reflux disease)    Headache    daily - stress   Herpes    Hyperlipidemia     Hypertension    Hypothyroidism    Insomnia    Osteoporosis    SCC (squamous cell carcinoma) 03/28/2021   left dorsal hand, EDC   Thoracic aortic aneurysm (TAA)    a.) s/p 4v FEVAR (CA, SMA, RRA, LRA) and proximal TEVAR extention with BILATERAL iliac limb extensions on 11/16/2020   Vertigo    1 episode - approx 2010    Weakness of both legs    intermittent    Family History: Family History  Problem Relation Age of Onset   Leukemia Mother    Aneurysm Other     Social History   Socioeconomic History   Marital status: Divorced    Spouse name: Not on file   Number of children: Not on file   Years of education: Not on file   Highest education level: Not on file  Occupational History   Not on file  Tobacco Use   Smoking status: Every Day    Packs/day: 1.00    Years: 30.00    Pack years: 30.00    Types: Cigarettes   Smokeless tobacco: Never  Vaping Use   Vaping Use: Never used  Substance and Sexual Activity   Alcohol use: No   Drug use: Never   Sexual activity: Not Currently    Birth control/protection: Post-menopausal  Other Topics Concern   Not on file  Social History Narrative   Not on file   Social Determinants of Health   Financial Resource Strain: Not on file  Food Insecurity: Not on file  Transportation Needs: Not on file  Physical Activity: Not on file  Stress: Not on file  Social Connections: Not on file  Intimate Partner Violence: Not on file      Review of Systems  Constitutional:  Negative for chills, fatigue and unexpected weight change.  HENT:  Positive for congestion and postnasal drip. Negative for rhinorrhea, sneezing and sore throat.   Eyes:  Negative for redness.  Respiratory:  Positive for cough. Negative for chest tightness, shortness of breath and wheezing.   Cardiovascular:  Negative for chest pain and palpitations.  Gastrointestinal:  Positive for abdominal pain. Negative for constipation, diarrhea, nausea and vomiting.   Genitourinary:  Negative for dysuria and frequency.  Musculoskeletal:  Negative for arthralgias, back pain, joint swelling and neck pain.  Skin:  Negative for rash.  Neurological: Negative.  Negative for tremors and numbness.  Hematological:  Negative for adenopathy. Does not bruise/bleed easily.  Psychiatric/Behavioral:  Negative for behavioral problems (Depression), sleep disturbance and suicidal ideas. The patient is nervous/anxious.    Vital Signs: BP 129/76   Pulse 78   Temp 98.2 F (36.8 C)   Resp 16   Ht 5\' 6"  (1.676 m)   Wt 131 lb 6.4 oz (59.6 kg)   SpO2 99%   BMI 21.21 kg/m    Physical Exam Vitals and nursing note reviewed.  Constitutional:      General: She is not in acute distress.    Appearance:  She is well-developed and normal weight. She is not diaphoretic.  HENT:     Head: Normocephalic and atraumatic.     Right Ear: External ear normal.     Left Ear: External ear normal.     Nose: Nose normal.     Mouth/Throat:     Pharynx: No oropharyngeal exudate.  Eyes:     General: No scleral icterus.       Right eye: No discharge.        Left eye: No discharge.     Conjunctiva/sclera: Conjunctivae normal.     Pupils: Pupils are equal, round, and reactive to light.  Neck:     Thyroid: No thyromegaly.     Vascular: No JVD.     Trachea: No tracheal deviation.  Cardiovascular:     Rate and Rhythm: Normal rate and regular rhythm.     Heart sounds: Normal heart sounds. No murmur heard.   No friction rub. No gallop.  Pulmonary:     Effort: Pulmonary effort is normal. No respiratory distress.     Breath sounds: Normal breath sounds. No stridor. No wheezing or rales.  Chest:     Chest wall: No tenderness.  Breasts:    Right: Normal. No mass.     Left: Normal. No mass.  Abdominal:     General: Bowel sounds are normal. There is no distension.     Palpations: Abdomen is soft. There is no mass.     Tenderness: There is abdominal tenderness. There is no guarding or  rebound.  Musculoskeletal:        General: No tenderness or deformity. Normal range of motion.     Cervical back: Normal range of motion and neck supple.  Lymphadenopathy:     Cervical: No cervical adenopathy.  Skin:    General: Skin is warm and dry.     Coloration: Skin is not pale.     Findings: No erythema or rash.  Neurological:     Mental Status: She is alert.     Cranial Nerves: No cranial nerve deficit.     Motor: No abnormal muscle tone.     Coordination: Coordination normal.     Deep Tendon Reflexes: Reflexes are normal and symmetric.  Psychiatric:        Behavior: Behavior normal.        Thought Content: Thought content normal.        Judgment: Judgment normal.       Assessment/Plan: 1. Abdominal pain, unspecified abdominal location Will refer to [facility] due to ongoing abdominal pain possibly from known hernia - Ambulatory referral to General Surgery  2. Hernia, hiatal Will refer to [facility] for evaluation to see if this may be contributing to her pain - Ambulatory referral to General Surgery  3. Primary insomnia May continue ambien as needed - zolpidem (AMBIEN) 10 MG tablet; Take 1 tablet (10 mg total) by mouth at bedtime.  Dispense: 30 tablet; Refill: 1  4. Generalized anxiety disorder May continue xanax ass needed - ALPRAZolam (XANAX) 0.25 MG tablet; Take 1 tablet (0.25 mg total) by mouth daily as needed for anxiety. for anxiety  Dispense: 30 tablet; Refill: 1  5. Acute non-recurrent frontal sinusitis Will start on augmentin and continue mucinex - amoxicillin-clavulanate (AUGMENTIN) 875-125 MG tablet; Take 1 tablet by mouth 2 (two) times daily.  Dispense: 20 tablet; Refill: 0  6. Hyperlipidemia, unspecified hyperlipidemia type Continue simvastatin   General Counseling: tearah saulsbury understanding of the findings of todays visit and  agrees with plan of treatment. I have discussed any further diagnostic evaluation that may be needed or ordered today. We also  reviewed her medications today. she has been encouraged to call the office with any questions or concerns that should arise related to todays visit.    Orders Placed This Encounter  Procedures   Ambulatory referral to General Surgery    Meds ordered this encounter  Medications   zolpidem (AMBIEN) 10 MG tablet    Sig: Take 1 tablet (10 mg total) by mouth at bedtime.    Dispense:  30 tablet    Refill:  1   ALPRAZolam (XANAX) 0.25 MG tablet    Sig: Take 1 tablet (0.25 mg total) by mouth daily as needed for anxiety. for anxiety    Dispense:  30 tablet    Refill:  1    Not to exceed 4 additional fills before 12/16/2021   amoxicillin-clavulanate (AUGMENTIN) 875-125 MG tablet    Sig: Take 1 tablet by mouth 2 (two) times daily.    Dispense:  20 tablet    Refill:  0    This patient was seen by Drema Dallas, PA-C in collaboration with Dr. Clayborn Bigness as a part of collaborative care agreement.   Total time spent:30 Minutes Time spent includes review of chart, medications, test results, and follow up plan with the patient.      Dr Lavera Guise Internal medicine

## 2021-09-27 ENCOUNTER — Other Ambulatory Visit: Payer: Self-pay | Admitting: Internal Medicine

## 2021-09-27 DIAGNOSIS — I10 Essential (primary) hypertension: Secondary | ICD-10-CM

## 2021-10-03 ENCOUNTER — Other Ambulatory Visit: Payer: Self-pay | Admitting: Physician Assistant

## 2021-10-03 ENCOUNTER — Telehealth: Payer: Self-pay | Admitting: Student-PharmD

## 2021-10-03 DIAGNOSIS — F411 Generalized anxiety disorder: Secondary | ICD-10-CM

## 2021-10-03 DIAGNOSIS — F5101 Primary insomnia: Secondary | ICD-10-CM

## 2021-10-03 MED ORDER — ZOLPIDEM TARTRATE 10 MG PO TABS: 10.0000 mg | ORAL_TABLET | Freq: Every day | ORAL | 1 refills | Status: DC

## 2021-10-03 MED ORDER — ALPRAZOLAM 0.25 MG PO TABS: 0.2500 mg | ORAL_TABLET | Freq: Every day | ORAL | 1 refills | Status: DC | PRN

## 2021-10-03 NOTE — Progress Notes (Addendum)
°  Chronic Care Management Pharmacy Assistant   Name: Joy Carpenter  MRN: [id number] DOB: Feb 21, 1951   Reason for Encounter: Medication Review/Medication Coordination  Call.   Medications: Outpatient Encounter Medications as of 10/03/2021  Medication Sig   [START ON 10/14/2021] ALPRAZolam (XANAX) 0.25 MG tablet Take 1 tablet (0.25 mg total) by mouth daily as needed for anxiety. for anxiety   amLODipine (NORVASC) 2.5 MG tablet TAKE 1 TABLET (2.5 MG TOTAL) BY MOUTH DAILY. FOR HYPERTENSION   amoxicillin-clavulanate (AUGMENTIN) 875-125 MG tablet Take 1 tablet by mouth 2 (two) times daily.   aspirin 81 MG chewable tablet Chew by mouth.   levothyroxine (SYNTHROID) 50 MCG tablet TAKE 1 TABLET BY MOUTH EVERY MORNING 30 MINUTES PRIOR TO EATING FOOD   pantoprazole (PROTONIX) 40 MG tablet Take 1 tablet (40 mg total) by mouth daily.   simvastatin (ZOCOR) 20 MG tablet TAKE 1 TABLET BY MOUTH EVERY DAY   traMADol-acetaminophen (ULTRACET) 37.5-325 MG tablet Take 1 tablet by mouth 2 (two) times daily as needed.   zolpidem (AMBIEN) 10 MG tablet Take 1 tablet (10 mg total) by mouth at bedtime.   No facility-administered encounter medications on file as of 10/03/2021.   Reviewed chart for medication changes ahead of medication coordination call.  No hospital visits since last pharmacist visit.  Office Visit: 09/18/21 Mylinda Latina, PA-C. For abdominal pain. STARTED Augmentin 875-125 MG tablet. CHANGED Alprazolam 0.25 mg daily PRN Take 1 tablet (0.25 mg total) by mouth daily as needed for anxiety. for anxiety, Starting Sat 10/14/2021. STOPPED Hydrocodone.   Consults: 09/14/21 [facility] [place] Donia Pounds, MD  For follow-up/Thoracoabdominal aortic aneurysm. No medication changes.  09/13/21 OBG[facility] Malachy Mood, MD. For post op visit.  07/31/21 OBGYN Malachy Mood, MD. For endometritis/Post op. No medication changes.    BP Readings from Last 3 Encounters:  09/18/21 129/76   09/13/21 122/68  07/31/21 124/76    No results found for: HGBA1C   Patient obtains medications through Adherence Packaging  90 Days   Last adherence delivery included: N/A.  Patient declined meds last month: N/A.  Patient is due for next adherence delivery on: 10/10/2021.  Called patient and reviewed medications and coordinated delivery.  This delivery to include: Ambien 10 mg Take 1 tablet by mouth at bedtime Aspirin 81 mg 1 tablet at breakfast  Amlodipine 2.5 mg 1 tablet at breakfast  Simvastatin 20 mg 1 tablet at breakfast  Pantoprazole 40 mg 1 tablet at breakfast  Levothyroxine 50 mcg 1 tablet at breakfast  Xanax 0.25 mg Take 1 tablet by mouth daily as needed for anxiety (VIAL)  Patient declined the following medications: None.  Patient needs refills for: Ambien 10 mg Take 1 tablet by mouth at bedtime and Xanax 0.25 mg Take 1 tablet by mouth daily as needed for anxiety (VIAL)-requested.   Confirmed delivery date of 10/10/2021, advised patient that pharmacy will contact them the morning of delivery.  Follow-Up:Pharmacist Review  Charlann Lange, RMA Clinical Pharmacist Assistant [phone]  10 minutes spent in review, coordination, and documentation.  Reviewed by: Alena Bills, PharmD Clinical Pharmacist [phone]

## 2021-10-06 DIAGNOSIS — E785 Hyperlipidemia, unspecified: Secondary | ICD-10-CM | POA: Diagnosis not present

## 2021-10-06 DIAGNOSIS — E039 Hypothyroidism, unspecified: Secondary | ICD-10-CM | POA: Diagnosis not present

## 2021-10-06 DIAGNOSIS — K219 Gastro-esophageal reflux disease without esophagitis: Secondary | ICD-10-CM | POA: Diagnosis not present

## 2021-10-09 ENCOUNTER — Other Ambulatory Visit: Payer: Self-pay

## 2021-10-09 ENCOUNTER — Telehealth: Payer: Self-pay

## 2021-10-09 MED ORDER — AZITHROMYCIN 250 MG PO TABS: ORAL_TABLET | ORAL | 0 refills | Status: AC

## 2021-10-09 NOTE — Telephone Encounter (Signed)
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  Per [facility] we sent in azithromycin 250 mg x 10 days.

## 2021-10-09 NOTE — Telephone Encounter (Signed)
Pt took covid test and was negative. Pt informed we sent zpak to pharmacy

## 2021-10-11 ENCOUNTER — Other Ambulatory Visit: Payer: Self-pay | Admitting: Physician Assistant

## 2021-10-11 DIAGNOSIS — E039 Hypothyroidism, unspecified: Secondary | ICD-10-CM

## 2021-10-16 ENCOUNTER — Other Ambulatory Visit: Payer: Self-pay

## 2021-10-16 ENCOUNTER — Ambulatory Visit (INDEPENDENT_AMBULATORY_CARE_PROVIDER_SITE_OTHER): Payer: Medicare HMO | Admitting: Surgery

## 2021-10-16 ENCOUNTER — Encounter: Payer: Self-pay | Admitting: Surgery

## 2021-10-16 VITALS — BP 126/78 | HR 84 | Temp 98.2°F | Ht 67.0 in | Wt 132.8 lb

## 2021-10-16 DIAGNOSIS — K449 Diaphragmatic hernia without obstruction or gangrene: Secondary | ICD-10-CM | POA: Diagnosis not present

## 2021-10-16 NOTE — Patient Instructions (Addendum)
Your CT is scheduled for 11/01/2021 @ 9:30 am at [facility] [address] [place]. Arrive by 9:15 am. Nothing to eat or drink 4 hours prior. Please pick up contrast between now and the day before.  Your Barium Swallow test is scheduled for 10/18/2021 @ 9 am at [facility]. Arrive by 8:30 am. Nothing to eat or drink 3 hours prior.   A referral has been placed to [facility] for an EGD. They will call you with an appointment.    If you have any concerns or questions, please feel free to call our office. See follow up appointment below.   Hiatal Hernia  A hiatal hernia occurs when part of the stomach slides above the muscle that separates the abdomen from the chest (diaphragm). A person can be born with a hiatal hernia (congenital), or it may develop over time. In almost all cases of hiatal hernia, only the top part of the stomach pushes through the diaphragm. Many people have a hiatal hernia with no symptoms. The larger the hernia, the more likely it is that you will have symptoms. In some cases, a hiatal hernia allows stomach acid to flow back into the tube that carries food from your mouth to your stomach (esophagus). This may cause heartburn symptoms. Severe heartburn symptoms may mean that you have developed a condition called gastroesophageal reflux disease (GERD). What are the causes? This condition is caused by a weakness in the opening (hiatus) where the esophagus passes through the diaphragm to attach to the upper part of the stomach. A person may be born with a weakness in the hiatus, or a weakness can develop over time. What increases the risk? This condition is more likely to develop in: Older people. Age is a major risk factor for a hiatal hernia, especially if you are over the age of 84. Pregnant women. People who are overweight. People who have frequent constipation. What are the signs or symptoms? Symptoms of this condition usually develop in the form of GERD symptoms. Symptoms  include: Heartburn. Belching. Indigestion. Trouble swallowing. Coughing or wheezing. Sore throat. Hoarseness. Chest pain. Nausea and vomiting. How is this diagnosed? This condition may be diagnosed during testing for GERD. Tests that may be done include: X-rays of your stomach or chest. An upper gastrointestinal (GI) series. This is an X-ray exam of your GI tract that is taken after you swallow a chalky liquid that shows up clearly on the X-ray. Endoscopy. This is a procedure to look into your stomach using a thin, flexible tube that has a tiny camera and light on the end of it. How is this treated? This condition may be treated by: Dietary and lifestyle changes to help reduce GERD symptoms. Medicines. These may include: Over-the-counter antacids. Medicines that make your stomach empty more quickly. Medicines that block the production of stomach acid (H2 blockers). Stronger medicines to reduce stomach acid (proton pump inhibitors). Surgery to repair the hernia, if other treatments are not helping. If you have no symptoms, you may not need treatment. Follow these instructions at home: Lifestyle and activity Do not use any products that contain nicotine or tobacco, such as cigarettes and e-cigarettes. If you need help quitting, ask your health care provider. Try to achieve and maintain a healthy body weight. Avoid putting pressure on your abdomen. Anything that puts pressure on your abdomen increases the amount of acid that may be pushed up into your esophagus. Avoid bending over, especially after eating. Raise the head of your bed by putting  blocks under the legs. This keeps your head and esophagus higher than your stomach. Do not wear tight clothing around your chest or stomach. Try not to strain when having a bowel movement, when urinating, or when lifting heavy objects. Eating and drinking Avoid foods that can worsen GERD symptoms. These may include: Fatty foods, like fried  foods. Citrus fruits, like oranges or lemon. Other foods and drinks that contain acid, like orange juice or tomatoes. Spicy food. Chocolate. Eat frequent small meals instead of three large meals a day. This helps prevent your stomach from getting too full. Eat slowly. Do not lie down right after eating. Do not eat 1-2 hours before bed. Do not drink beverages with caffeine. These include cola, coffee, cocoa, and tea. Do not drink alcohol. General instructions Take over-the-counter and prescription medicines only as told by your health care provider. Keep all follow-up visits as told by your health care provider. This is important. Contact a health care provider if: Your symptoms are not controlled with medicines or lifestyle changes. You are having trouble swallowing. You have coughing or wheezing that will not go away. Get help right away if: Your pain is getting worse. Your pain spreads to your arms, neck, jaw, teeth, or back. You have shortness of breath. You sweat for no reason. You feel sick to your stomach (nauseous) or you vomit. You vomit blood. You have bright red blood in your stools. You have black, tarry stools. Summary A hiatal hernia occurs when part of the stomach slides above the muscle that separates the abdomen from the chest (diaphragm). A person may be born with a weakness in the hiatus, or a weakness can develop over time. Symptoms of hiatal hernia may include heartburn, trouble swallowing, or sore throat. Management of hiatal hernia includes eating frequent small meals instead of three large meals a day. Get help right away if you vomit blood, have bright red blood in your stools, or have black, tarry stools. This information is not intended to replace advice given to you by your health care provider. Make sure you discuss any questions you have with your health care provider. Document Revised: 08/25/2020 Document Reviewed: 08/25/2020 Elsevier Patient Education   2022 [employer].

## 2021-10-16 NOTE — Progress Notes (Signed)
Patient ID: Joy Carpenter, female   DOB: Sep 14, 1951, 71 y.o.   MRN: [id number]  HPI Joy Carpenter is a 71 y.o. female seen for a known paraesophageal hernia.  Of note she is s/p for vessels FEVAR and TEVAR extension and Iliac extension  11/16/20 . She is currently on baby aspirin.  She presents with chronic left abdominal pain.  The pain is intermittent dull and seems to be worsening with certain positions and activities.  Sometimes the pain radiates to the back.  She specifically denies any reflux, she denies any swallowing issues, she denies any hematochezia or melena.  She denies any diarrhea.  She is able to perform more than 4 METS of activity without any shortness of breath or chest pain.  She continues to smoke a pack a day.  She also takes a lot of BC powder for headaches. She did have a CT scan in May 2021 that have personally reviewed showing a large hiatal hernia. CBC Hb 13.5 wbc 7.9. CMP creat 1.14 nml LFTs,  She does have some COPD. EGD 06/2016 personally reviewed There were esophageal mucosal changes suspicious for long-segment Barrett's esophagus some gastritis. She is on Protonix daily  HPI  Past Medical History:  Diagnosis Date   Actinic keratosis 03/28/2021   right upper arm, bx proven   Anxiety    Carpal tunnel syndrome 2019   Depression    Emphysema of lung ([place])    "mild"   GERD (gastroesophageal reflux disease)    Headache    daily - stress   Herpes    Hyperlipidemia    Hypertension    Hypothyroidism    Insomnia    Osteoporosis    SCC (squamous cell carcinoma) 03/28/2021   left dorsal hand, EDC   Thoracic aortic aneurysm (TAA)    a.) s/p 4v FEVAR (CA, SMA, RRA, LRA) and proximal TEVAR extention with BILATERAL iliac limb extensions on 11/16/2020   Vertigo    1 episode - approx 2010    Weakness of both legs    intermittent    Past Surgical History:  Procedure Laterality Date   COLONOSCOPY     COLONOSCOPY WITH PROPOFOL N/A 06/14/2016   Procedure:  COLONOSCOPY WITH PROPOFOL;  Surgeon: Lucilla Lame, MD;  Location: [place];  Service: Endoscopy;  Laterality: N/A;   ESOPHAGOGASTRODUODENOSCOPY (EGD) WITH PROPOFOL N/A 06/14/2016   Procedure: ESOPHAGOGASTRODUODENOSCOPY (EGD) WITH PROPOFOL;  Surgeon: Lucilla Lame, MD;  Location: [place];  Service: Endoscopy;  Laterality: N/A;   HYSTEROSCOPY WITH D & C N/A 07/18/2021   Procedure: DILATATION AND CURETTAGE /HYSTEROSCOPY;  Surgeon: Malachy Mood, MD;  Location: [facility];  Service: Gynecology;  Laterality: N/A;   THORACIC AORTIC ANEURYSM REPAIR N/A 11/16/2020   Procedure: 4V FEVAR (CA, SMA, RRA, LRA) WITH PROXIMAL TEVAR EXTENSION WITH BILATERAL ILIAC LIMB EXTENSIONS; Location: [facility]; Surgeon; Vinnie Level, MD   TUBAL LIGATION      Family History  Problem Relation Age of Onset   Leukemia Mother    Aneurysm Other     Social History Social History   Tobacco Use   Smoking status: Every Day    Packs/day: 1.00    Years: 30.00    Pack years: 30.00    Types: Cigarettes   Smokeless tobacco: Never  Vaping Use   Vaping Use: Never used  Substance Use Topics   Alcohol use: No   Drug use: Never    No Known Allergies  Current Outpatient Medications  Medication Sig Dispense Refill  ALPRAZolam (XANAX) 0.25 MG tablet Take 1 tablet (0.25 mg total) by mouth daily as needed for anxiety. for anxiety 30 tablet 1   amLODipine (NORVASC) 2.5 MG tablet TAKE 1 TABLET (2.5 MG TOTAL) BY MOUTH DAILY. FOR HYPERTENSION 90 tablet 1   amoxicillin-clavulanate (AUGMENTIN) 875-125 MG tablet Take 1 tablet by mouth 2 (two) times daily. 20 tablet 0   aspirin 81 MG chewable tablet Chew by mouth.     levothyroxine (SYNTHROID) 50 MCG tablet TAKE 1 TABLET BY MOUTH EVERY MORNING 30 MINUTES PRIOR TO EATING FOOD 90 tablet 1   pantoprazole (PROTONIX) 40 MG tablet Take 1 tablet (40 mg total) by mouth daily. 90 tablet 1   simvastatin (ZOCOR) 20 MG tablet TAKE 1 TABLET BY MOUTH EVERY DAY 90 tablet 3    traMADol-acetaminophen (ULTRACET) 37.5-325 MG tablet Take 1 tablet by mouth 2 (two) times daily as needed. 60 tablet 1   [START ON 10/18/2021] zolpidem (AMBIEN) 10 MG tablet Take 1 tablet (10 mg total) by mouth at bedtime. 30 tablet 1   No current facility-administered medications for this visit.     Review of Systems Full ROS  was asked and was negative except for the information on the HPI  Physical Exam Blood pressure 126/78, pulse 84, temperature 98.2 F (36.8 C), temperature source Oral, height 5\' 7"  (1.702 m), weight 132 lb 12.8 oz (60.2 kg), SpO2 97 %. CONSTITUTIONAL: NAD. EYES: Pupils are equal, round, and reactive to light, Sclera are non-icteric. EARS, NOSE, MOUTH AND THROAT: The oropharynx is clear. The oral mucosa is pink and moist. Hearing is intact to voice. LYMPH NODES:  Lymph nodes in the neck are normal. RESPIRATORY:  Lungs some wheezes on expiration. There is normal respiratory effort, with equal breath sounds bilaterally, and without pathologic use of accessory muscles. CARDIOVASCULAR: Heart is regular without murmurs, gallops, or rubs. GI: The abdomen is  soft, nontender, and nondistended. There are no palpable masses. There is no hepatosplenomegaly. There are normal bowel sounds in all quadrants. GU: Rectal deferred.   MUSCULOSKELETAL: Normal muscle strength and tone. No cyanosis or edema.   SKIN: Turgor is good and there are no pathologic skin lesions or ulcers. NEUROLOGIC: Motor and sensation is grossly normal. Cranial nerves are grossly intact. PSYCH:  Oriented to person, place and time. Affect is normal.  Data Reviewed  I have personally reviewed the patient's imaging, laboratory findings and medical records.    Assessment/Plan 71 year old female with history of peripheral vascular disease and thoracic aortic aneurysm status post FEVAR procedure With 4 vessel.  Now with known history of type III paraesophageal hernia.  She does have some abdominal pain on the  left side.  Unsure if her abdominal pain is related to her hernia. Cussed with the patient in detail about the potential etiologies to include peptic ulcer disease, paraesophageal hernia and colitis.  She takes [employer] daily.  Encouraged her not to do that and replace it with plain Tylenol. We will start work-up with a CT scan of the abdomen and pelvis to evaluate for abdominal pain and paraesophageal hernia, she will also need barium swallow and a repeat EGD as her last EGD was more than 6 years ago and she potentially can have peptic ulcer disease as well related to NSAIDs.  I will see her back once all the work-up is completed.   Please note that I spent greater than 60 minutes in this encounter including coordination of her care, reviewing the images and the records personally,  counseling the patient, placing orders and performing appropriate documentation   Caroleen Hamman, MD FACS General Surgeon 10/16/2021, 9:22 AM

## 2021-10-18 ENCOUNTER — Ambulatory Visit
Admission: RE | Admit: 2021-10-18 | Discharge: 2021-10-18 | Disposition: A | Discharge status: Home / Self Care | Payer: Medicare HMO | Source: Ambulatory Visit | Attending: Surgery | Admitting: Surgery

## 2021-10-18 ENCOUNTER — Other Ambulatory Visit: Payer: Self-pay

## 2021-10-18 ENCOUNTER — Other Ambulatory Visit: Payer: Self-pay | Admitting: Surgery

## 2021-10-18 DIAGNOSIS — K449 Diaphragmatic hernia without obstruction or gangrene: Secondary | ICD-10-CM | POA: Insufficient documentation

## 2021-10-18 DIAGNOSIS — K224 Dyskinesia of esophagus: Secondary | ICD-10-CM | POA: Diagnosis not present

## 2021-11-01 ENCOUNTER — Other Ambulatory Visit: Payer: Self-pay

## 2021-11-01 ENCOUNTER — Ambulatory Visit
Admission: RE | Admit: 2021-11-01 | Discharge: 2021-11-01 | Disposition: A | Discharge status: Home / Self Care | Payer: Medicare HMO | Source: Ambulatory Visit | Attending: Surgery | Admitting: Surgery

## 2021-11-01 DIAGNOSIS — K449 Diaphragmatic hernia without obstruction or gangrene: Secondary | ICD-10-CM | POA: Diagnosis not present

## 2021-11-01 DIAGNOSIS — K5730 Diverticulosis of large intestine without perforation or abscess without bleeding: Secondary | ICD-10-CM | POA: Diagnosis not present

## 2021-11-01 LAB — POCT I-STAT CREATININE: Creatinine, Ser: 1.3 mg/dL — ABNORMAL HIGH (ref 0.44–1.00)

## 2021-11-01 MED ORDER — IOHEXOL 300 MG/ML  SOLN
85.0000 mL | Freq: Once | INTRAMUSCULAR | Status: AC | PRN
  Administered 2021-11-01: 10:00:00 85 mL via INTRAVENOUS

## 2021-11-02 ENCOUNTER — Telehealth: Payer: Self-pay

## 2021-11-02 NOTE — Telephone Encounter (Signed)
Patient notified of CT results. Reminded to keep appointment 2/6/@8 :45.

## 2021-11-13 ENCOUNTER — Other Ambulatory Visit: Payer: Self-pay

## 2021-11-13 ENCOUNTER — Ambulatory Visit: Payer: Medicare HMO | Admitting: Surgery

## 2021-11-13 DIAGNOSIS — K2289 Other specified disease of esophagus: Secondary | ICD-10-CM

## 2021-11-13 NOTE — Progress Notes (Signed)
Ginger asked to schedule  patient for egd so scheduled with dr Allen Norris for 11/27/2021 msc

## 2021-11-16 ENCOUNTER — Encounter: Payer: Self-pay | Admitting: Gastroenterology

## 2021-11-22 ENCOUNTER — Other Ambulatory Visit: Payer: Self-pay

## 2021-11-22 ENCOUNTER — Ambulatory Visit: Payer: Medicare HMO | Admitting: Student-PharmD

## 2021-11-22 DIAGNOSIS — E785 Hyperlipidemia, unspecified: Secondary | ICD-10-CM

## 2021-11-22 DIAGNOSIS — F411 Generalized anxiety disorder: Secondary | ICD-10-CM

## 2021-11-22 NOTE — Progress Notes (Signed)
Follow Up Pharmacist Visit    Joy Carpenter, Joy Carpenter [id number] 57 years, Female  DOB: 12-08-1950  M: [phone]  Patient Chart Prep   Chronic Conditions Patient's Chronic Conditions: Gastroesophageal Reflux Disease (GERD), Hypothyroidism, Osteoarthritis, Hyperlipidemia/Dyslipidemia (HLD), Insomnia, Anxiety  Doctor and Hospital Visits Were there PCP Visits since last visit with the Pharmacist?: No Were there Specialist Visits since last visit with the Pharmacist?: Yes Visit #1: 10/16/21 General Surgery Pabon, Marjory Lies, MD. For Hiatal hernia. No medication changes. Was there a Hospital Visit in last 30 days?: No Were there other Hospital Visits since last visit with the Pharmacist?: No  Medication Information Have there been any medication changes from PCP or Specialist since last visit with the Pharmacist?: No Are there any Medication adherence gaps (beyond 5 days past due)?: No Medication adherence rates for the STAR rating drugs: Simvastatin 20 mg 10/09/21 90 DS . List Patient's current Care Gaps: No current Care Gaps identified  Pre-Call Questions  Completed by Charlann Lange on 11/20/2021  Are you able to connect with Patient: Yes Confirmed appointment date/time with patient/caregiver?: Yes Date/time of the appointment: 11/22/20 at 3:00 PM  Visit type: Phone Patient/Caregiver instructed to bring medications to appointment: Yes What, if any, problems do you have getting your medications from the pharmacy?: None What is your top health concern to discuss at your upcoming visit?: Patient stated none. Have you seen any other providers since your last visit?: No  Disease Assessments  Subjective Information Current BP: 126/78 Current HR: 84 taken on: 10/16/2021 Weight: 132 BMI: 20.80 Last GFR: 52 taken on: 09/14/2021 Why did the patient present?: CCM F/U Visit Lifestyle habits such as diet and exercise?: Exercise: patient states no particular routine but is on her feet alot  for her part time job at [employer] Any additional demeanor/mood notes?: Patient presents via telephone and is pleasant to speak with.  Hyperlipidemia/Dyslipidemia (HLD) Last Lipid panel on: 09/14/2021 TC (Goal<200): 199 LDL: 127 HDL (Goal>40): 54 TG (Goal<150): 101 ASCVD 10-year risk?is:: Intermediate (7.5%-20%) ASCVD Risk Score: 12.6 % Assess this condition today?: Yes LDL Goal: <100 We discussed: How a diet high in plant sterols (fruits/vegetables/nuts/whole grains/legumes) may reduce your cholesterol. Assessment:: Uncontrolled Drug: Simvastatin 20mg  1 tablet daily Assessment: Appropriate, Effective, Safe, Accessible Additional Info: Patient's LDL was elevated at last check but patient had eaten before the lab draw. Will recheck at next visit and evaluate for improvement at that time. Plan to: Continue medication therapy HC Follow up: 4 months Pharmacist Follow up: 07/25/22  Anxiety Assess this condition today?: Yes Completing the GAD-7 Questionnaire today?: No In your opinion, how do you feel your anxiety symptoms have been controlled over the past 3 months?: Stable / stayed the same Assessment:: Controlled Drug: Alprazolam 0.25mg  1 daily as needed Assessment: Appropriate, Effective, Safe, Accessible Plan to: Continue medication therapy HC Follow up: 4 months Pharmacist Follow up: 07/25/22  Exercise, Diet and Non-Drug Coordination Needs Additional exercise counseling points. We discussed: targeting at least 151 minutes per week of moderate-intensity aerobic exercise. Discussed Non-Drug Care Coordination Needs: Yes Does Patient have Medication financial barriers?: No  Accountable Health Communities Health-Related Social Needs Screening Tool -  SDOH  What is your living situation today? (ref #1): I have a steady place to live Think about the place you live. Do you have problems with any of the following? (ref #2): None of the above Within the past 12 months, you worried that  your food would run out before you got money to buy  more (ref #3): Never true Within the past 12 months, the food you bought just didn't last and you didn't have money to get more (ref #4): Never true In the past 12 months, has lack of reliable transportation kept you from medical appointments, meetings, work or from getting things needed for daily living? (ref #5): No In the past 12 months, has the electric, gas, oil, or water company threatened to shut off services in your home? (ref #6): No How often does anyone, including family and friends, physically hurt you? (ref #7): Never (1) How often does anyone, including family and friends, insult or talk down to you? (ref #8): Never (1) How often does anyone, including friends and family, threaten you with harm? (ref #9): Never (1) How often does anyone, including family and friends, scream or curse at you? (ref #10): Never (1)  Cholesterol Content in Foods Cholesterol is a waxy, fat-like substance that helps to carry fat in the blood. The body needs cholesterol in small amounts, but too much cholesterol can cause damage to the arteries and heart. What foods have cholesterol? Chicken, fish, and eggs.  Cholesterol is found in animal-based foods, such as meat, seafood, and dairy. Generally, low-fat dairy and lean meats have less cholesterol than full-fat dairy and fatty meats. The milligrams of cholesterol per serving (mg per serving) of common cholesterol-containing foods are listed below. Meats and other proteins Egg -- one large whole egg has 186 mg. Veal shank -- 4 oz (113 g) has 141 mg. Lean ground [place] (93% lean) -- 4 oz (113 g) has 118 mg. Fat-trimmed lamb loin -- 4 oz (113 g) has 106 mg. Lean ground beef (90% lean) -- 4 oz (113 g) has 100 mg. Lobster -- 3.5 oz (99 g) has 90 mg. Pork loin chops -- 4 oz (113 g) has 86 mg. Canned salmon -- 3.5 oz (99 g) has 83 mg. Fat-trimmed beef top loin -- 4 oz (113 g) has 78 mg. Frankfurter -- 1 frank  (3.5 oz or 99 g) has 77 mg. Crab -- 3.5 oz (99 g) has 71 mg. Roasted chicken without skin, white meat -- 4 oz (113 g) has 66 mg. Light bologna -- 2 oz (57 g) has 45 mg. Deli-cut [place] -- 2 oz (57 g) has 31 mg. Canned tuna -- 3.5 oz (99 g) has 31 mg. Berniece Salines -- 1 oz (28 g) has 29 mg. Oysters and mussels (raw) -- 3.5 oz (99 g) has 25 mg. Mackerel -- 1 oz (28 g) has 22 mg. Trout -- 1 oz (28 g) has 20 mg. Pork sausage -- 1 link (1 oz or 28 g) has 17 mg. Salmon -- 1 oz (28 g) has 16 mg. Tilapia -- 1 oz (28 g) has 14 mg. Dairy Soft-serve ice cream --  cup (4 oz or 86 g) has 103 mg. Whole-milk yogurt -- 1 cup (8 oz or 245 g) has 29 mg. Cheddar cheese -- 1 oz (28 g) has 28 mg. American cheese -- 1 oz (28 g) has 28 mg. Whole milk -- 1 cup (8 oz or 250 mL) has 23 mg. 2% milk -- 1 cup (8 oz or 250 mL) has 18 mg. Cream cheese -- 1 tablespoon (Tbsp) (14.5 g) has 15 mg. Cottage cheese --  cup (4 oz or 113 g) has 14 mg. Low-fat (1%) milk -- 1 cup (8 oz or 250 mL) has 10 mg. Sour cream -- 1 Tbsp (12 g) has 8.5 mg. Low-fat yogurt --  1 cup (8 oz or 245 g) has 8 mg. Nonfat Greek yogurt -- 1 cup (8 oz or 228 g) has 7 mg. Half-and-half cream -- 1 Tbsp (15 mL) has 5 mg. Fats and oils Cod liver oil -- 1 tablespoon (Tbsp) (13.6 g) has 82 mg. Butter -- 1 Tbsp (14 g) has 15 mg. Lard -- 1 Tbsp (12.8 g) has 14 mg. Bacon grease -- 1 Tbsp (12.9 g) has 14 mg. Mayonnaise -- 1 Tbsp (13.8 g) has 5-10 mg. Margarine -- 1 Tbsp (14 g) has 3-10 mg. The items listed above may not be a complete list of foods with cholesterol. Exact amounts of cholesterol in these foods may vary depending on specific ingredients and brands. Contact a dietitian for more information. What foods do not have cholesterol? A sampling of vegetables and other plant-based foods.  Most plant-based foods do not have cholesterol unless you combine them with a food that has cholesterol. Foods without cholesterol include: Grains and  cereals. Vegetables. Fruits. Vegetable oils, such as olive, canola, and sunflower oil. Legumes, such as peas, beans, and lentils. Nuts and seeds. Egg whites. The items listed above may not be a complete list of foods that do not have cholesterol. Contact a dietitian for more information. Summary The body needs cholesterol in small amounts, but too much cholesterol can cause damage to the arteries and heart. Cholesterol is found in animal-based foods, such as meat, seafood, and dairy. Generally, low-fat dairy and lean meats have less cholesterol than full-fat dairy and fatty meats. This information is not intended to replace advice given to you by your health care provider. Make sure you discuss any questions you have with your health care provider.       Engagement Notes Charlann Lange on 11/20/2021 03:23 PM [facility] Chart Review: 10 min 11/20/21 [facility] Assessment call time spent: 5 min 11/20/21 CP Chart Review: 14 min 11/20/21 CP Office Visit: 17 min 11/22/21 CP Office Documentation: 15 11/22/21  Alena Bills Clinical Pharmacist [phone]

## 2021-11-27 ENCOUNTER — Encounter
Admission: RE | Disposition: A | Discharge status: Home / Self Care | Payer: Self-pay | Source: Home / Self Care | Attending: Gastroenterology

## 2021-11-27 ENCOUNTER — Ambulatory Visit: Payer: Medicare HMO | Admitting: Anesthesiology

## 2021-11-27 ENCOUNTER — Ambulatory Visit
Admission: RE | Admit: 2021-11-27 | Discharge: 2021-11-27 | Disposition: A | Discharge status: Home / Self Care | Payer: Medicare HMO | Attending: Gastroenterology | Admitting: Gastroenterology

## 2021-11-27 ENCOUNTER — Other Ambulatory Visit: Payer: Self-pay

## 2021-11-27 ENCOUNTER — Encounter: Payer: Self-pay | Admitting: Gastroenterology

## 2021-11-27 DIAGNOSIS — F17210 Nicotine dependence, cigarettes, uncomplicated: Secondary | ICD-10-CM | POA: Diagnosis not present

## 2021-11-27 DIAGNOSIS — K219 Gastro-esophageal reflux disease without esophagitis: Secondary | ICD-10-CM | POA: Diagnosis not present

## 2021-11-27 DIAGNOSIS — K259 Gastric ulcer, unspecified as acute or chronic, without hemorrhage or perforation: Secondary | ICD-10-CM | POA: Insufficient documentation

## 2021-11-27 DIAGNOSIS — R12 Heartburn: Secondary | ICD-10-CM

## 2021-11-27 DIAGNOSIS — I10 Essential (primary) hypertension: Secondary | ICD-10-CM | POA: Insufficient documentation

## 2021-11-27 DIAGNOSIS — K2289 Other specified disease of esophagus: Secondary | ICD-10-CM | POA: Diagnosis not present

## 2021-11-27 DIAGNOSIS — K449 Diaphragmatic hernia without obstruction or gangrene: Secondary | ICD-10-CM | POA: Diagnosis not present

## 2021-11-27 DIAGNOSIS — F418 Other specified anxiety disorders: Secondary | ICD-10-CM | POA: Diagnosis not present

## 2021-11-27 DIAGNOSIS — E039 Hypothyroidism, unspecified: Secondary | ICD-10-CM | POA: Insufficient documentation

## 2021-11-27 HISTORY — PX: ESOPHAGOGASTRODUODENOSCOPY (EGD) WITH PROPOFOL: SHX5813

## 2021-11-27 SURGERY — ESOPHAGOGASTRODUODENOSCOPY (EGD) WITH PROPOFOL
Anesthesia: General | Site: Mouth

## 2021-11-27 MED ORDER — LACTATED RINGERS IV SOLN: INTRAVENOUS | Status: DC

## 2021-11-27 MED ORDER — STERILE WATER FOR IRRIGATION IR SOLN
Status: DC | PRN
  Administered 2021-11-27: 1

## 2021-11-27 MED ORDER — SODIUM CHLORIDE 0.9 % IV SOLN: INTRAVENOUS | Status: DC

## 2021-11-27 MED ORDER — LIDOCAINE HCL (CARDIAC) PF 100 MG/5ML IV SOSY
PREFILLED_SYRINGE | INTRAVENOUS | Status: DC | PRN
Start: 2021-11-27 — End: 2021-11-27
  Administered 2021-11-27: 30 mg via INTRAVENOUS

## 2021-11-27 MED ORDER — PROPOFOL 10 MG/ML IV BOLUS
INTRAVENOUS | Status: DC | PRN
  Administered 2021-11-27 (×2): 40 mg via INTRAVENOUS
  Administered 2021-11-27: 150 mg via INTRAVENOUS

## 2021-11-27 MED ORDER — ACETAMINOPHEN 325 MG PO TABS: 325.0000 mg | ORAL_TABLET | ORAL | Status: DC | PRN

## 2021-11-27 MED ORDER — ACETAMINOPHEN 160 MG/5ML PO SOLN: 325.0000 mg | ORAL | Status: DC | PRN

## 2021-11-27 SURGICAL SUPPLY — 8 items
BLOCK BITE 60FR ADLT L/F GRN (MISCELLANEOUS) ×2 IMPLANT
FORCEPS BIOP RAD 4 LRG CAP 4 (CUTTING FORCEPS) ×1 IMPLANT
GOWN CVR UNV OPN BCK APRN NK (MISCELLANEOUS) ×2 IMPLANT
GOWN ISOL THUMB LOOP REG UNIV (MISCELLANEOUS) ×4
KIT PRC NS LF DISP ENDO (KITS) ×1 IMPLANT
KIT PROCEDURE OLYMPUS (KITS) ×2
MANIFOLD NEPTUNE II (INSTRUMENTS) ×2 IMPLANT
WATER STERILE IRR 250ML POUR (IV SOLUTION) ×2 IMPLANT

## 2021-11-27 NOTE — Anesthesia Preprocedure Evaluation (Signed)
Anesthesia Evaluation  Patient identified by MRN, date of birth, ID band Patient awake    Reviewed: Allergy & Precautions, H&P , NPO status , Patient's Chart, lab work & pertinent test results, reviewed documented beta blocker date and time   Airway Mallampati: II  TM Distance: >3 FB Neck ROM: full    Dental no notable dental hx.    Pulmonary COPD, Current Smoker,    Pulmonary exam normal breath sounds clear to auscultation       Cardiovascular Exercise Tolerance: Good hypertension, + Peripheral Vascular Disease  Normal cardiovascular exam Rhythm:regular Rate:Normal     Neuro/Psych  Headaches, PSYCHIATRIC DISORDERS Anxiety Depression    GI/Hepatic Neg liver ROS, hiatal hernia,   Endo/Other  Hypothyroidism   Renal/GU negative Renal ROS     Musculoskeletal   Abdominal   Peds  Hematology negative hematology ROS (+)   Anesthesia Other Findings   Reproductive/Obstetrics negative OB ROS                             Anesthesia Physical Anesthesia Plan  ASA: 3  Anesthesia Plan: General   Post-op Pain Management:    Induction:   PONV Risk Score and Plan:   Airway Management Planned:   Additional Equipment:   Intra-op Plan:   Post-operative Plan:   Informed Consent: I have reviewed the patients History and Physical, chart, labs and discussed the procedure including the risks, benefits and alternatives for the proposed anesthesia with the patient or authorized representative who has indicated his/her understanding and acceptance.     Dental Advisory Given  Plan Discussed with: CRNA and Anesthesiologist  Anesthesia Plan Comments:         Anesthesia Quick Evaluation

## 2021-11-27 NOTE — H&P (Signed)
Joy Lame, MD [address] [place], [place] [address] [phone] Fax : [phone]  Primary Care Physician:  Lavera Guise, MD Primary Gastroenterologist:  Dr. Allen Norris  Pre-Procedure History & Physical: HPI:  Joy Carpenter is a 71 y.o. female is here for an endoscopy.   Past Medical History:  Diagnosis Date   Actinic keratosis 03/28/2021   right upper arm, bx proven   Anxiety    Carpal tunnel syndrome 2019   Depression    Emphysema of lung ([place])    "mild"   GERD (gastroesophageal reflux disease)    Headache    daily - stress   Herpes    Hyperlipidemia    Hypertension    Hypothyroidism    Insomnia    Osteoporosis    SCC (squamous cell carcinoma) 03/28/2021   left dorsal hand, EDC   Thoracic aortic aneurysm (TAA)    a.) s/p 4v FEVAR (CA, SMA, RRA, LRA) and proximal TEVAR extention with BILATERAL iliac limb extensions on 11/16/2020   Vertigo    1 episode - approx 2010    Weakness of both legs    intermittent    Past Surgical History:  Procedure Laterality Date   COLONOSCOPY     COLONOSCOPY WITH PROPOFOL N/A 06/14/2016   Procedure: COLONOSCOPY WITH PROPOFOL;  Surgeon: Joy Lame, MD;  Location: [place];  Service: Endoscopy;  Laterality: N/A;   ESOPHAGOGASTRODUODENOSCOPY (EGD) WITH PROPOFOL N/A 06/14/2016   Procedure: ESOPHAGOGASTRODUODENOSCOPY (EGD) WITH PROPOFOL;  Surgeon: Joy Lame, MD;  Location: [place];  Service: Endoscopy;  Laterality: N/A;   HYSTEROSCOPY WITH D & C N/A 07/18/2021   Procedure: DILATATION AND CURETTAGE /HYSTEROSCOPY;  Surgeon: Malachy Mood, MD;  Location: [facility];  Service: Gynecology;  Laterality: N/A;   THORACIC AORTIC ANEURYSM REPAIR N/A 11/16/2020   Procedure: 4V FEVAR (CA, SMA, RRA, LRA) WITH PROXIMAL TEVAR EXTENSION WITH BILATERAL ILIAC LIMB EXTENSIONS; Location: [facility]; Surgeon; Vinnie Level, MD   TUBAL LIGATION      Prior to Admission medications   Medication Sig Start Date End  Date Taking? Authorizing Provider  ALPRAZolam (XANAX) 0.25 MG tablet Take 1 tablet (0.25 mg total) by mouth daily as needed for anxiety. for anxiety 10/14/21  Yes McDonough, Lauren K, PA-C  amLODipine (NORVASC) 2.5 MG tablet TAKE 1 TABLET (2.5 MG TOTAL) BY MOUTH DAILY. FOR HYPERTENSION 08/30/21  Yes Abernathy, Yetta Flock, NP  aspirin 81 MG chewable tablet Chew by mouth. 11/21/20  Yes [provider]  levothyroxine (SYNTHROID) 50 MCG tablet TAKE 1 TABLET BY MOUTH EVERY MORNING 30 MINUTES PRIOR TO EATING FOOD 10/11/21  Yes McDonough, Lauren K, PA-C  pantoprazole (PROTONIX) 40 MG tablet Take 1 tablet (40 mg total) by mouth daily. 07/23/21  Yes Lavera Guise, MD  simvastatin (ZOCOR) 20 MG tablet TAKE 1 TABLET BY MOUTH EVERY DAY 09/18/21  Yes McDonough, Lauren K, PA-C  traMADol-acetaminophen (ULTRACET) 37.5-325 MG tablet Take 1 tablet by mouth 2 (two) times daily as needed. 06/05/21  Yes McDonough, Lauren K, PA-C  zolpidem (AMBIEN) 10 MG tablet Take 1 tablet (10 mg total) by mouth at bedtime. 10/18/21  Yes McDonough, Lauren K, PA-C  amoxicillin-clavulanate (AUGMENTIN) 875-125 MG tablet Take 1 tablet by mouth 2 (two) times daily. 09/18/21   McDonough, Si Gaul, PA-C    Allergies as of 11/13/2021   (No Known Allergies)    Family History  Problem Relation Age of Onset   Leukemia Mother    Aneurysm Other  Social History   Socioeconomic History   Marital status: Divorced    Spouse name: Not on file   Number of children: Not on file   Years of education: Not on file   Highest education level: Not on file  Occupational History   Not on file  Tobacco Use   Smoking status: Every Day    Packs/day: 1.00    Years: 30.00    Pack years: 30.00    Types: Cigarettes   Smokeless tobacco: Never  Vaping Use   Vaping Use: Never used  Substance and Sexual Activity   Alcohol use: No   Drug use: Never   Sexual activity: Not Currently    Birth control/protection: Post-menopausal  Other Topics  Concern   Not on file  Social History Narrative   Not on file   Social Determinants of Health   Financial Resource Strain: Not on file  Food Insecurity: Not on file  Transportation Needs: Not on file  Physical Activity: Not on file  Stress: Not on file  Social Connections: Not on file  Intimate Partner Violence: Not on file    Review of Systems: See HPI, otherwise negative ROS  Physical Exam: BP 129/79    Pulse 70    Temp (!) 97.4 F (36.3 C) (Temporal)    Ht 5\' 7"  (1.702 m)    Wt 58.5 kg    SpO2 99%    BMI 20.20 kg/m  General:   Alert,  pleasant and cooperative in NAD Head:  Normocephalic and atraumatic. Neck:  Supple; no masses or thyromegaly. Lungs:  Clear throughout to auscultation.    Heart:  Regular rate and rhythm. Abdomen:  Soft, nontender and nondistended. Normal bowel sounds, without guarding, and without rebound.   Neurologic:  Alert and  oriented x4;  grossly normal neurologically.  Impression/Plan: Joy Carpenter is here for an endoscopy to be performed for GERD  Risks, benefits, limitations, and alternatives regarding  endoscopy have been reviewed with the patient.  Questions have been answered.  All parties agreeable.   Joy Lame, MD  11/27/2021, 9:33 AM

## 2021-11-27 NOTE — Op Note (Signed)
[facility] Gastroenterology Patient Name: Joy Carpenter Procedure Date: 11/27/2021 9:57 AM MRN: [id number] Account #: [id number] Date of Birth: Jun 25, 1951 Admit Type: Outpatient Age: 72 Room: [facility] OR ROOM 01 Gender: Female Note Status: Finalized Instrument Name: [id number] Procedure:             Upper GI endoscopy Indications:           Heartburn Providers:             Lucilla Lame MD, MD Referring MD:          Lavera Guise, MD (Referring MD) Medicines:             Propofol per Anesthesia Complications:         No immediate complications. Procedure:             Pre-Anesthesia Assessment:                        - Prior to the procedure, a History and Physical was                         performed, and patient medications and allergies were                         reviewed. The patient's tolerance of previous                         anesthesia was also reviewed. The risks and benefits                         of the procedure and the sedation options and risks                         were discussed with the patient. All questions were                         answered, and informed consent was obtained. Prior                         Anticoagulants: The patient has taken no previous                         anticoagulant or antiplatelet agents. ASA Grade                         Assessment: II - A patient with mild systemic disease.                         After reviewing the risks and benefits, the patient                         was deemed in satisfactory condition to undergo the                         procedure.                        After obtaining informed consent, the endoscope was  passed under direct vision. Throughout the procedure,                         the patient's blood pressure, pulse, and oxygen                         saturations were monitored continuously. The Endoscope                         was introduced through the mouth,  and advanced to the                         second part of duodenum. The upper GI endoscopy was                         accomplished without difficulty. The patient tolerated                         the procedure well. Findings:      A 6 cm from 30 to 36 cm hiatal hernia was present.      Few non-bleeding superficial gastric ulcers with no stigmata of bleeding       were found in the gastric antrum. Biopsies were taken with a cold       forceps for histology.      The examined duodenum was normal. Impression:            - 6 cm from 30 to 36 cm hiatal hernia.                        - Non-bleeding gastric ulcers with no stigmata of                         bleeding. Biopsied.                        - Normal examined duodenum. Recommendation:        - Discharge patient to home.                        - Resume previous diet.                        - Continue present medications.                        - Await pathology results. Procedure Code(s):     --- Professional ---                        [phone], Esophagogastroduodenoscopy, flexible,                         transoral; with biopsy, single or multiple Diagnosis Code(s):     --- Professional ---                        R12, Heartburn                        K25.9, Gastric ulcer, unspecified as acute or chronic,  without hemorrhage or perforation CPT copyright 2019 American Medical Association. All rights reserved. The codes documented in this report are preliminary and upon coder review may  be revised to meet current compliance requirements. Lucilla Lame MD, MD 11/27/2021 10:12:08 AM This report has been signed electronically. Number of Addenda: 0 Note Initiated On: 11/27/2021 9:57 AM Total Procedure Duration: 0 hours 3 minutes 33 seconds  Estimated Blood Loss:  Estimated blood loss: none.      [facility]

## 2021-11-27 NOTE — Transfer of Care (Signed)
Immediate Anesthesia Transfer of Care Note  Patient: Joy Carpenter  Procedure(s) Performed: ESOPHAGOGASTRODUODENOSCOPY (EGD) WITH BIOPSY (Mouth)  Patient Location: PACU  Anesthesia Type: General  Level of Consciousness: awake, alert  and patient cooperative  Airway and Oxygen Therapy: Patient Spontanous Breathing and Patient connected to supplemental oxygen  Post-op Assessment: Post-op Vital signs reviewed, Patient's Cardiovascular Status Stable, Respiratory Function Stable, Patent Airway and No signs of Nausea or vomiting  Post-op Vital Signs: Reviewed and stable  Complications: No notable events documented.

## 2021-11-27 NOTE — Anesthesia Postprocedure Evaluation (Signed)
Anesthesia Post Note  Patient: Joy Carpenter  Procedure(s) Performed: ESOPHAGOGASTRODUODENOSCOPY (EGD) WITH BIOPSY (Mouth)     Patient location during evaluation: PACU Anesthesia Type: General Level of consciousness: awake and alert Pain management: pain level controlled Vital Signs Assessment: post-procedure vital signs reviewed and stable Respiratory status: spontaneous breathing, nonlabored ventilation, respiratory function stable and patient connected to nasal cannula oxygen Cardiovascular status: blood pressure returned to baseline and stable Postop Assessment: no apparent nausea or vomiting Anesthetic complications: no   No notable events documented.  Trecia Rogers

## 2021-11-27 NOTE — Anesthesia Procedure Notes (Signed)
Date/Time: 11/27/2021 10:03 AM Performed by: Cameron Ali, CRNA Pre-anesthesia Checklist: Patient identified, Emergency Drugs available, Suction available, Timeout performed and Patient being monitored Patient Re-evaluated:Patient Re-evaluated prior to induction Oxygen Delivery Method: Nasal cannula Placement Confirmation: positive ETCO2

## 2021-11-28 ENCOUNTER — Encounter: Payer: Self-pay | Admitting: Gastroenterology

## 2021-11-28 DIAGNOSIS — I110 Hypertensive heart disease with heart failure: Secondary | ICD-10-CM | POA: Diagnosis not present

## 2021-11-28 DIAGNOSIS — I5022 Chronic systolic (congestive) heart failure: Secondary | ICD-10-CM | POA: Diagnosis not present

## 2021-11-28 LAB — SURGICAL PATHOLOGY

## 2021-12-05 ENCOUNTER — Telehealth: Payer: Self-pay | Admitting: Student-PharmD

## 2021-12-05 DIAGNOSIS — E039 Hypothyroidism, unspecified: Secondary | ICD-10-CM

## 2021-12-05 DIAGNOSIS — K219 Gastro-esophageal reflux disease without esophagitis: Secondary | ICD-10-CM

## 2021-12-05 DIAGNOSIS — E785 Hyperlipidemia, unspecified: Secondary | ICD-10-CM

## 2021-12-05 NOTE — Progress Notes (Signed)
°  Chronic Care Management Pharmacy Assistant   Name: Joy Carpenter  MRN: [id number] DOB: Jul 28, 1951  Reason for Encounter: Care Plan and Patient Handout    Medications: Outpatient Encounter Medications as of 12/05/2021  Medication Sig   ALPRAZolam (XANAX) 0.25 MG tablet Take 1 tablet (0.25 mg total) by mouth daily as needed for anxiety. for anxiety   amLODipine (NORVASC) 2.5 MG tablet TAKE 1 TABLET (2.5 MG TOTAL) BY MOUTH DAILY. FOR HYPERTENSION   amoxicillin-clavulanate (AUGMENTIN) 875-125 MG tablet Take 1 tablet by mouth 2 (two) times daily.   aspirin 81 MG chewable tablet Chew by mouth.   levothyroxine (SYNTHROID) 50 MCG tablet TAKE 1 TABLET BY MOUTH EVERY MORNING 30 MINUTES PRIOR TO EATING FOOD   pantoprazole (PROTONIX) 40 MG tablet Take 1 tablet (40 mg total) by mouth daily.   simvastatin (ZOCOR) 20 MG tablet TAKE 1 TABLET BY MOUTH EVERY DAY   traMADol-acetaminophen (ULTRACET) 37.5-325 MG tablet Take 1 tablet by mouth 2 (two) times daily as needed.   zolpidem (AMBIEN) 10 MG tablet Take 1 tablet (10 mg total) by mouth at bedtime.   No facility-administered encounter medications on file as of 12/05/2021.    Reviewed the patients visit reinsured it was completed per the pharmacist Alena Bills request. Printed the CCM care plan. Mailed the patient CCM care plan and patient handout to their most recent address on file.  Time: 3 min  Charlann Lange, [place]  [phone]

## 2021-12-07 ENCOUNTER — Other Ambulatory Visit: Payer: Self-pay | Admitting: Physician Assistant

## 2021-12-07 DIAGNOSIS — F411 Generalized anxiety disorder: Secondary | ICD-10-CM

## 2021-12-08 DIAGNOSIS — H25813 Combined forms of age-related cataract, bilateral: Secondary | ICD-10-CM | POA: Diagnosis not present

## 2021-12-08 DIAGNOSIS — H43813 Vitreous degeneration, bilateral: Secondary | ICD-10-CM | POA: Diagnosis not present

## 2021-12-11 DIAGNOSIS — Z01818 Encounter for other preprocedural examination: Secondary | ICD-10-CM | POA: Diagnosis not present

## 2021-12-11 DIAGNOSIS — H25812 Combined forms of age-related cataract, left eye: Secondary | ICD-10-CM | POA: Diagnosis not present

## 2021-12-11 DIAGNOSIS — H25811 Combined forms of age-related cataract, right eye: Secondary | ICD-10-CM | POA: Diagnosis not present

## 2021-12-18 ENCOUNTER — Ambulatory Visit (INDEPENDENT_AMBULATORY_CARE_PROVIDER_SITE_OTHER): Payer: Medicare HMO | Admitting: Physician Assistant

## 2021-12-18 ENCOUNTER — Encounter: Payer: Self-pay | Admitting: Physician Assistant

## 2021-12-18 ENCOUNTER — Telehealth: Payer: Self-pay

## 2021-12-18 ENCOUNTER — Other Ambulatory Visit: Payer: Self-pay

## 2021-12-18 VITALS — BP 135/62 | HR 87 | Temp 98.5°F | Resp 16 | Ht 67.0 in | Wt 130.6 lb

## 2021-12-18 DIAGNOSIS — I10 Essential (primary) hypertension: Secondary | ICD-10-CM | POA: Diagnosis not present

## 2021-12-18 DIAGNOSIS — F411 Generalized anxiety disorder: Secondary | ICD-10-CM

## 2021-12-18 DIAGNOSIS — F5101 Primary insomnia: Secondary | ICD-10-CM | POA: Diagnosis not present

## 2021-12-18 DIAGNOSIS — R109 Unspecified abdominal pain: Secondary | ICD-10-CM | POA: Diagnosis not present

## 2021-12-18 DIAGNOSIS — R300 Dysuria: Secondary | ICD-10-CM | POA: Diagnosis not present

## 2021-12-18 DIAGNOSIS — K449 Diaphragmatic hernia without obstruction or gangrene: Secondary | ICD-10-CM

## 2021-12-18 LAB — POCT URINALYSIS DIPSTICK
Bilirubin, UA: NEGATIVE
Blood, UA: NEGATIVE
Glucose, UA: NEGATIVE
Leukocytes, UA: NEGATIVE
Nitrite, UA: NEGATIVE
Protein, UA: NEGATIVE
Spec Grav, UA: >=1.03 — AB (ref 1.010–1.025)
Urobilinogen, UA: 0.2 E.U./dL
pH, UA: 5 (ref 5.0–8.0)

## 2021-12-18 MED ORDER — HYDROCODONE-ACETAMINOPHEN 5-325 MG PO TABS: ORAL_TABLET | ORAL | 0 refills | Status: DC

## 2021-12-18 MED ORDER — ALPRAZOLAM 0.25 MG PO TABS: ORAL_TABLET | ORAL | 1 refills | Status: DC

## 2021-12-18 MED ORDER — ZOLPIDEM TARTRATE 10 MG PO TABS: 10.0000 mg | ORAL_TABLET | Freq: Every day | ORAL | 1 refills | Status: DC

## 2021-12-18 NOTE — Progress Notes (Unsigned)
[facility] [place], [place] [address]  Internal MEDICINE  Office Visit Note  Patient Name: Joy Carpenter  [id number]  [id number]  Date of Service: 12/18/2021  Chief Complaint  Patient presents with   Follow-up    Pelvic pain on left side since pt had surgery    Urinary Tract Infection    Lower back pain, some discharge, frequency, urgency at night      HPI  Pt is here for routine follow up -had EGD and has follow up with GI, next week. Also saw general surgery who sent her to GI -Pain in left side with sitting, does ok when laying down -Goes back to vascular for [place] and Ct and then office visit in 2 weeks -ambien and xanax future refills -Uti symptoms, low back pain and discharged Cataract surgery Thursday left, right on the 20th  Current Medication: Outpatient Encounter Medications as of 12/18/2021  Medication Sig   ALPRAZolam (XANAX) 0.25 MG tablet TAKE 1 TABLET (0.25 MG TOTAL) BY MOUTH DAILY AS NEEDED FOR ANXIETY   amLODipine (NORVASC) 2.5 MG tablet TAKE 1 TABLET (2.5 MG TOTAL) BY MOUTH DAILY. FOR HYPERTENSION   amoxicillin-clavulanate (AUGMENTIN) 875-125 MG tablet Take 1 tablet by mouth 2 (two) times daily.   aspirin 81 MG chewable tablet Chew by mouth.   levothyroxine (SYNTHROID) 50 MCG tablet TAKE 1 TABLET BY MOUTH EVERY MORNING 30 MINUTES PRIOR TO EATING FOOD   pantoprazole (PROTONIX) 40 MG tablet Take 1 tablet (40 mg total) by mouth daily.   simvastatin (ZOCOR) 20 MG tablet TAKE 1 TABLET BY MOUTH EVERY DAY   traMADol-acetaminophen (ULTRACET) 37.5-325 MG tablet Take 1 tablet by mouth 2 (two) times daily as needed.   zolpidem (AMBIEN) 10 MG tablet Take 1 tablet (10 mg total) by mouth at bedtime.   No facility-administered encounter medications on file as of 12/18/2021.    Surgical History: Past Surgical History:  Procedure Laterality Date   COLONOSCOPY     COLONOSCOPY WITH PROPOFOL N/A 06/14/2016   Procedure: COLONOSCOPY WITH PROPOFOL;   Surgeon: Lucilla Lame, MD;  Location: [facility];  Service: Endoscopy;  Laterality: N/A;   ESOPHAGOGASTRODUODENOSCOPY (EGD) WITH PROPOFOL N/A 06/14/2016   Procedure: ESOPHAGOGASTRODUODENOSCOPY (EGD) WITH PROPOFOL;  Surgeon: Lucilla Lame, MD;  Location: [facility];  Service: Endoscopy;  Laterality: N/A;   ESOPHAGOGASTRODUODENOSCOPY (EGD) WITH PROPOFOL N/A 11/27/2021   Procedure: ESOPHAGOGASTRODUODENOSCOPY (EGD) WITH BIOPSY;  Surgeon: Lucilla Lame, MD;  Location: [facility];  Service: Endoscopy;  Laterality: N/A;   HYSTEROSCOPY WITH D & C N/A 07/18/2021   Procedure: DILATATION AND CURETTAGE /HYSTEROSCOPY;  Surgeon: Malachy Mood, MD;  Location: [facility];  Service: Gynecology;  Laterality: N/A;   THORACIC AORTIC ANEURYSM REPAIR N/A 11/16/2020   Procedure: 4V FEVAR (CA, SMA, RRA, LRA) WITH PROXIMAL TEVAR EXTENSION WITH BILATERAL ILIAC LIMB EXTENSIONS; Location: [facility]; Surgeon; Vinnie Level, MD   TUBAL LIGATION      Medical History: Past Medical History:  Diagnosis Date   Actinic keratosis 03/28/2021   right upper arm, bx proven   Anxiety    Carpal tunnel syndrome 2019   Depression    Emphysema of lung ([place])    "mild"   GERD (gastroesophageal reflux disease)    Headache    daily - stress   Herpes    Hyperlipidemia    Hypertension    Hypothyroidism    Insomnia    Osteoporosis    SCC (squamous cell carcinoma) 03/28/2021   left dorsal hand,  [facility]   Thoracic aortic aneurysm (TAA)    a.) s/p 4v FEVAR (CA, SMA, RRA, LRA) and proximal TEVAR extention with BILATERAL iliac limb extensions on 11/16/2020   Vertigo    1 episode - approx 2010    Weakness of both legs    intermittent    Family History: Family History  Problem Relation Age of Onset   Leukemia Mother    Aneurysm Other     Social History   Socioeconomic History   Marital status: Divorced    Spouse name: Not on file   Number of children: Not on file   Years of education: Not on file   Highest  education level: Not on file  Occupational History   Not on file  Tobacco Use   Smoking status: Every Day    Packs/day: 1.00    Years: 30.00    Pack years: 30.00    Types: Cigarettes   Smokeless tobacco: Never  Vaping Use   Vaping Use: Never used  Substance and Sexual Activity   Alcohol use: No   Drug use: Never   Sexual activity: Not Currently    Birth control/protection: Post-menopausal  Other Topics Concern   Not on file  Social History Narrative   Not on file   Social Determinants of Health   Financial Resource Strain: Not on file  Food Insecurity: Not on file  Transportation Needs: Not on file  Physical Activity: Not on file  Stress: Not on file  Social Connections: Not on file  Intimate Partner Violence: Not on file      Review of Systems  Vital Signs: BP 135/62    Pulse 87    Temp 98.5 F (36.9 C)    Resp 16    Ht '5\' 7"'$  (1.702 m)    Wt 130 lb 9.6 oz (59.2 kg)    SpO2 98%    BMI 20.45 kg/m    Physical Exam     Assessment/Plan:   General Counseling: Cameryn verbalizes understanding of the findings of todays visit and agrees with plan of treatment. I have discussed any further diagnostic evaluation that may be needed or ordered today. We also reviewed her medications today. she has been encouraged to call the office with any questions or concerns that should arise related to todays visit.    Orders Placed This Encounter  Procedures   POCT Urinalysis Dipstick    No orders of the defined types were placed in this encounter.   This patient was seen by Drema Dallas, PA-C in collaboration with Dr. Clayborn Bigness as a part of collaborative care agreement.   Total time spent:*** Minutes Time spent includes review of chart, medications, test results, and follow up plan with the patient.      Dr Lavera Guise Internal medicine

## 2021-12-21 DIAGNOSIS — H25812 Combined forms of age-related cataract, left eye: Secondary | ICD-10-CM | POA: Diagnosis not present

## 2021-12-21 LAB — CULTURE, URINE COMPREHENSIVE

## 2021-12-25 ENCOUNTER — Other Ambulatory Visit: Payer: Self-pay

## 2021-12-25 ENCOUNTER — Telehealth: Payer: Self-pay | Admitting: Surgery

## 2021-12-25 ENCOUNTER — Telehealth: Payer: Self-pay

## 2021-12-25 ENCOUNTER — Ambulatory Visit: Payer: Medicare HMO | Admitting: Surgery

## 2021-12-25 ENCOUNTER — Other Ambulatory Visit: Payer: Self-pay | Admitting: Physician Assistant

## 2021-12-25 ENCOUNTER — Encounter: Payer: Self-pay | Admitting: Surgery

## 2021-12-25 VITALS — BP 151/78 | HR 93 | Temp 98.2°F | Ht 67.0 in | Wt 130.8 lb

## 2021-12-25 DIAGNOSIS — N3000 Acute cystitis without hematuria: Secondary | ICD-10-CM

## 2021-12-25 DIAGNOSIS — R1032 Left lower quadrant pain: Secondary | ICD-10-CM

## 2021-12-25 DIAGNOSIS — K449 Diaphragmatic hernia without obstruction or gangrene: Secondary | ICD-10-CM

## 2021-12-25 MED ORDER — AMPICILLIN 500 MG PO CAPS: 500.0000 mg | ORAL_CAPSULE | Freq: Three times a day (TID) | ORAL | 0 refills | Status: DC

## 2021-12-25 NOTE — Patient Instructions (Addendum)
Our surgery scheduler Pamala Hurry will call you within 24-48 hours to get you scheduled. If you have not heard from her after 48 hours, please call our office. You will need to get Covid tested before surgery and have the blue sheet available when she calls to write down important information. ? ? ?If you have any concerns or questions, please feel free to call our office.  ? ?Hiatal Hernia ?A hiatal hernia occurs when part of the stomach slides above the muscle that separates the abdomen from the chest (diaphragm). A person can be born with a hiatal hernia (congenital), or it may develop over time. In almost all cases of hiatal hernia, only the top part of the stomach pushes through the diaphragm. ?Many people have a hiatal hernia with no symptoms. The larger the hernia, the more likely it is that you will have symptoms. In some cases, a hiatal hernia allows stomach acid to flow back into the tube that carries food from your mouth to your stomach (esophagus). This may cause heartburn symptoms. Severe heartburn symptoms may mean that you have developed a condition called gastroesophageal reflux disease (GERD). ?What are the causes? ?This condition is caused by a weakness in the opening (hiatus) where the esophagus passes through the diaphragm to attach to the upper part of the stomach. A person may be born with a weakness in the hiatus, or a weakness can develop over time. ?What increases the risk? ?This condition is more likely to develop in: ?Older people. Age is a major risk factor for a hiatal hernia, especially if you are over the age of 7. ?Pregnant women. ?People who are overweight. ?People who have frequent constipation. ?What are the signs or symptoms? ?Symptoms of this condition usually develop in the form of GERD symptoms. Symptoms include: ?Heartburn. ?Belching. ?Indigestion. ?Trouble swallowing. ?Coughing or wheezing. ?Sore throat. ?Hoarseness. ?Chest pain. ?Nausea and vomiting. ?How is this  diagnosed? ?This condition may be diagnosed during testing for GERD. Tests that may be done include: ?X-rays of your stomach or chest. ?An upper gastrointestinal (GI) series. This is an X-ray exam of your GI tract that is taken after you swallow a chalky liquid that shows up clearly on the X-ray. ?Endoscopy. This is a procedure to look into your stomach using a thin, flexible tube that has a tiny camera and light on the end of it. ?How is this treated? ?This condition may be treated by: ?Dietary and lifestyle changes to help reduce GERD symptoms. ?Medicines. These may include: ?Over-the-counter antacids. ?Medicines that make your stomach empty more quickly. ?Medicines that block the production of stomach acid (H2 blockers). ?Stronger medicines to reduce stomach acid (proton pump inhibitors). ?Surgery to repair the hernia, if other treatments are not helping. ?If you have no symptoms, you may not need treatment. ?Follow these instructions at home: ?Lifestyle and activity ?Do not use any products that contain nicotine or tobacco, such as cigarettes and e-cigarettes. If you need help quitting, ask your health care provider. ?Try to achieve and maintain a healthy body weight. ?Avoid putting pressure on your abdomen. Anything that puts pressure on your abdomen increases the amount of acid that may be pushed up into your esophagus. ?Avoid bending over, especially after eating. ?Raise the head of your bed by putting blocks under the legs. This keeps your head and esophagus higher than your stomach. ?Do not wear tight clothing around your chest or stomach. ?Try not to strain when having a bowel movement, when urinating, or when lifting  heavy objects. ?Eating and drinking ?Avoid foods that can worsen GERD symptoms. These may include: ?Fatty foods, like fried foods. ?Citrus fruits, like oranges or lemon. ?Other foods and drinks that contain acid, like orange juice or tomatoes. ?Spicy food. ?Chocolate. ?Eat frequent small  meals instead of three large meals a day. This helps prevent your stomach from getting too full. ?Eat slowly. ?Do not lie down right after eating. ?Do not eat 1-2 hours before bed. ?Do not drink beverages with caffeine. These include cola, coffee, cocoa, and tea. ?Do not drink alcohol. ?General instructions ?Take over-the-counter and prescription medicines only as told by your health care provider. ?Keep all follow-up visits as told by your health care provider. This is important. ?Contact a health care provider if: ?Your symptoms are not controlled with medicines or lifestyle changes. ?You are having trouble swallowing. ?You have coughing or wheezing that will not go away. ?Get help right away if: ?Your pain is getting worse. ?Your pain spreads to your arms, neck, jaw, teeth, or back. ?You have shortness of breath. ?You sweat for no reason. ?You feel sick to your stomach (nauseous) or you vomit. ?You vomit blood. ?You have bright red blood in your stools. ?You have black, tarry stools. ?Summary ?A hiatal hernia occurs when part of the stomach slides above the muscle that separates the abdomen from the chest (diaphragm). ?A person may be born with a weakness in the hiatus, or a weakness can develop over time. ?Symptoms of hiatal hernia may include heartburn, trouble swallowing, or sore throat. ?Management of hiatal hernia includes eating frequent small meals instead of three large meals a day. ?Get help right away if you vomit blood, have bright red blood in your stools, or have black, tarry stools. ?This information is not intended to replace advice given to you by your health care provider. Make sure you discuss any questions you have with your health care provider. ?Document Revised: 08/25/2020 Document Reviewed: 08/25/2020 ?Elsevier Patient Education ? [place]. ? ?

## 2021-12-25 NOTE — Telephone Encounter (Signed)
Outgoing call is made, left message to call, please inform patient of the following scheduled surgery:  ? ?Pre-Admission date/time, COVID Testing date and Surgery date. ? ?Surgery Date: 01/23/22 ?Preadmission Testing Date: 01/16/22 (phone 8a-1p) ?Covid Testing Date: Not needed.   ? ?Patient has been made aware to call [phone], between 1-3:00pm the day before surgery, to find out what time to arrive for surgery.   ? ?

## 2021-12-25 NOTE — Telephone Encounter (Signed)
Patient calls back, she is now aware of all dates regarding her surgery and verbalized understanding.   ?

## 2021-12-25 NOTE — Telephone Encounter (Signed)
-----   Message from Mylinda Latina, PA-C sent at 12/25/2021  1:26 PM EDT ----- ?Please let her know that her culture did show some bacteria so I will send ampicillin for her ?

## 2021-12-25 NOTE — Telephone Encounter (Signed)
Spoke with patient to notify them about urine culture results on 12/25/2021. ?

## 2021-12-26 ENCOUNTER — Encounter: Payer: Self-pay | Admitting: Surgery

## 2021-12-26 ENCOUNTER — Other Ambulatory Visit: Payer: Self-pay

## 2021-12-26 DIAGNOSIS — N3000 Acute cystitis without hematuria: Secondary | ICD-10-CM

## 2021-12-26 MED ORDER — AMPICILLIN 500 MG PO CAPS: 500.0000 mg | ORAL_CAPSULE | Freq: Three times a day (TID) | ORAL | 0 refills | Status: DC

## 2021-12-26 NOTE — Telephone Encounter (Signed)
Pt called back that [facility] to don't have antibiotic lauren send ampicillin resend to [facility] ?

## 2021-12-26 NOTE — Progress Notes (Signed)
Outpatient Surgical Follow Up ? ?12/26/2021 ? ?Joy Carpenter is an 71 y.o. female.  ? ?Chief Complaint  ?Patient presents with  ? Follow-up  ?  Hiatal hernia  ? ? ?HPI: Joy Carpenter is a 71 year old female well-known to me with a symptomatic paraesophageal hernia type III.  She has completed her EGD, barium swallow as well as a CT of the abdomen pelvis.  Please note that I have personally reviewed all the imaging studies.  She does have moderate to large sized paraesophageal hernia with about half of her stomach within the mediastinum.  She does have some peristalsis issues but I think that this is related to the chronic paraesophageal hernia and not a primary esophageal muscle disorder.  There is no evidence of a strictures. ?He continues to endorse in addition to the epigastric pain and bloating issues left lower abdominal pain.  Seems to be worsening when she sits up.  She is convinced that that is likely related to the iliac extension of the stent.  Vascular surgery has seen this and they do not consider that this is related. ?He is able to perform more than 4 METS of activity without any shortness of breath or chest pain.  She denies any dysphagia ? ?s/p f FEVAR and TEVAR extension and Iliac extension   ? ?Past Medical History:  ?Diagnosis Date  ? Actinic keratosis 03/28/2021  ? right upper arm, bx proven  ? Anxiety   ? Carpal tunnel syndrome 2019  ? Depression   ? Emphysema of lung ([place])   ? "mild"  ? GERD (gastroesophageal reflux disease)   ? Headache   ? daily - stress  ? Herpes   ? Hyperlipidemia   ? Hypertension   ? Hypothyroidism   ? Insomnia   ? Osteoporosis   ? SCC (squamous cell carcinoma) 03/28/2021  ? left dorsal hand, ED[facility]  ? Thoracic aortic aneurysm (TAA)   ? a.) s/p 4v FEVAR (CA, SMA, RRA, LRA) and proximal TEVAR extention with BILATERAL iliac limb extensions on 11/16/2020  ? Vertigo   ? 1 episode - approx 2010   ? Weakness of both legs   ? intermittent  ? ? ?Past Surgical History:  ?Procedure  Laterality Date  ? COLONOSCOPY    ? COLONOSCOPY WITH PROPOFOL N/A 06/14/2016  ? Procedure: COLONOSCOPY WITH PROPOFOL;  Surgeon: Lucilla Lame, MD;  Location: [place];  Service: Endoscopy;  Laterality: N/A;  ? ESOPHAGOGASTRODUODENOSCOPY (EGD) WITH PROPOFOL N/A 06/14/2016  ? Procedure: ESOPHAGOGASTRODUODENOSCOPY (EGD) WITH PROPOFOL;  Surgeon: Lucilla Lame, MD;  Location: [place];  Service: Endoscopy;  Laterality: N/A;  ? ESOPHAGOGASTRODUODENOSCOPY (EGD) WITH PROPOFOL N/A 11/27/2021  ? Procedure: ESOPHAGOGASTRODUODENOSCOPY (EGD) WITH BIOPSY;  Surgeon: Lucilla Lame, MD;  Location: [place];  Service: Endoscopy;  Laterality: N/A;  ? HYSTEROSCOPY WITH D & C N/A 07/18/2021  ? Procedure: DILATATION AND CURETTAGE /HYSTEROSCOPY;  Surgeon: Malachy Mood, MD;  Location: [facility];  Service: Gynecology;  Laterality: N/A;  ? THORACIC AORTIC ANEURYSM REPAIR N/A 11/16/2020  ? Procedure: 4V FEVAR (CA, SMA, RRA, LRA) WITH PROXIMAL TEVAR EXTENSION WITH BILATERAL ILIAC LIMB EXTENSIONS; Location: [facility]; Surgeon; Vinnie Level, MD  ? TUBAL LIGATION    ? ? ?Family History  ?Problem Relation Age of Onset  ? Leukemia Mother   ? Aneurysm Other   ? ? ?Social History:  reports that she has been smoking cigarettes. She has a 30.00 pack-year smoking history. She has never used smokeless tobacco. She reports that she does not  drink alcohol and does not use drugs. ? ?Allergies: No Known Allergies ? ?Medications reviewed. ? ? ? ?ROS ?Full ROS performed and is otherwise negative other than what is stated in HPI ? ? ?BP (!) 151/78   Pulse 93   Temp 98.2 ?F (36.8 ?C) (Oral)   Ht '5\' 7"'$  (1.702 m)   Wt 130 lb 12.8 oz (59.3 kg)   SpO2 98%   BMI 20.49 kg/m?  ? ?Physical Exam ?Vitals and nursing note reviewed. Exam conducted with a chaperone present.  ?Constitutional:   ?   General: She is not in acute distress. ?   Appearance: Normal appearance. She is not ill-appearing.  ?Cardiovascular:  ?   Rate and Rhythm: Normal  rate and regular rhythm.  ?   Heart sounds: No murmur heard. ?  No friction rub.  ?Pulmonary:  ?   Effort: Pulmonary effort is normal. No respiratory distress.  ?   Breath sounds: Normal breath sounds. No stridor. No wheezing or rhonchi.  ?Abdominal:  ?   General: Abdomen is flat. There is no distension.  ?   Palpations: Abdomen is soft. There is no mass.  ?   Tenderness: There is no abdominal tenderness. There is no guarding or rebound.  ?   Hernia: No hernia is present.  ?Musculoskeletal:     ?   General: No swelling or tenderness. Normal range of motion.  ?   Cervical back: Normal range of motion and neck supple. No rigidity or tenderness.  ?Lymphadenopathy:  ?   Cervical: No cervical adenopathy.  ?Skin: ?   General: Skin is warm and dry.  ?   Capillary Refill: Capillary refill takes less than 2 seconds.  ?   Coloration: Skin is not jaundiced or pale.  ?Neurological:  ?   General: No focal deficit present.  ?   Mental Status: She is alert and oriented to person, place, and time.  ?Psychiatric:     ?   Mood and Affect: Mood normal.     ?   Behavior: Behavior normal.     ?   Thought Content: Thought content normal.     ?   Judgment: Judgment normal.  ? ? ?Assessment/Plan: ?71 year old female with symptomatic type III paraesophageal hernia.  In addition to this the patient is ?s/p  FEVAR and TEVAR extension and Iliac extension  .  He does have both epigastric and left lower quadrant pain.  Discussed with the patient in detail that I do think that the epigastric pain is caused by the moderate paraesophageal hernia.  I cannot really explain the lower quadrant pain.  DisCussed about options for management of paraesophageal hernia and I do think that he will be reasonable to proceed with paraesophageal hernia repair.  He was very transparent and candid with her and told her that I was not sure if her lower abdominal pain will be in prove or cause by the esophageal hernia.  She understands this.  She wishes to move  forward with rib repair of paraesophageal hernia.  Her husband is getting some chemo treatments and she would like to schedule this in about 4 weeks.  She will be a good candidate for robotic repair with mesh placement.  Procedure discussed with her in detail.  Risks, benefits and possible complications including but not limited to, bleeding, infection, esophageal bowel injuries, pneumothorax, chronic pain.  Dysphagia and bloating. ?She wishes to proceed. ?Plan for robotic paraesophageal hernia repair with mesh and Nissen fundoplication ?Given  her aneurysmal history we had her continue her aspirin in the perioperative period ?Is not that I spent more than 45 minutes in this encounter including personally reviewing imaging studies, counseling the patient, coordinating her care, placing orders and performing appropriate documentation ? ? ?Caroleen Hamman, MD FACS ?General Surgeon  ?

## 2021-12-28 ENCOUNTER — Other Ambulatory Visit: Payer: Self-pay

## 2021-12-28 DIAGNOSIS — Z1239 Encounter for other screening for malignant neoplasm of breast: Secondary | ICD-10-CM

## 2021-12-28 DIAGNOSIS — Z79899 Other long term (current) drug therapy: Secondary | ICD-10-CM

## 2021-12-28 DIAGNOSIS — R5383 Other fatigue: Secondary | ICD-10-CM

## 2021-12-28 DIAGNOSIS — R300 Dysuria: Secondary | ICD-10-CM

## 2021-12-28 DIAGNOSIS — Z0001 Encounter for general adult medical examination with abnormal findings: Secondary | ICD-10-CM

## 2021-12-28 DIAGNOSIS — I700 Atherosclerosis of aorta: Secondary | ICD-10-CM

## 2021-12-28 DIAGNOSIS — Z124 Encounter for screening for malignant neoplasm of cervix: Secondary | ICD-10-CM

## 2021-12-28 DIAGNOSIS — R109 Unspecified abdominal pain: Secondary | ICD-10-CM

## 2021-12-28 DIAGNOSIS — K219 Gastro-esophageal reflux disease without esophagitis: Secondary | ICD-10-CM

## 2021-12-28 DIAGNOSIS — I10 Essential (primary) hypertension: Secondary | ICD-10-CM

## 2021-12-28 DIAGNOSIS — F5101 Primary insomnia: Secondary | ICD-10-CM

## 2021-12-28 DIAGNOSIS — E559 Vitamin D deficiency, unspecified: Secondary | ICD-10-CM

## 2021-12-28 DIAGNOSIS — F411 Generalized anxiety disorder: Secondary | ICD-10-CM

## 2021-12-28 MED ORDER — PANTOPRAZOLE SODIUM 40 MG PO TBEC: 40.0000 mg | DELAYED_RELEASE_TABLET | Freq: Every day | ORAL | 1 refills | Status: DC

## 2021-12-28 MED ORDER — AMLODIPINE BESYLATE 2.5 MG PO TABS: ORAL_TABLET | ORAL | 1 refills | Status: DC

## 2021-12-28 MED ORDER — SIMVASTATIN 20 MG PO TABS: ORAL_TABLET | ORAL | 3 refills | Status: DC

## 2021-12-29 ENCOUNTER — Telehealth: Payer: Self-pay | Admitting: Student-PharmD

## 2021-12-29 NOTE — Progress Notes (Signed)
?  Chronic Care Management ?Pharmacy Assistant  ? ?Name: Joy Carpenter  MRN: [id number] DOB: [birth year] ? ?Reason for Encounter: Medication Review/Medication Coordination Call  ? ?Medications: ?Outpatient Encounter Medications as of 12/29/2021  ?Medication Sig  ? [START ON 01/08/2022] ALPRAZolam (XANAX) 0.25 MG tablet TAKE 1 TABLET (0.25 MG TOTAL) BY MOUTH DAILY AS NEEDED FOR ANXIETY  ? amLODipine (NORVASC) 2.5 MG tablet TAKE 1 TABLET (2.5 MG TOTAL) BY MOUTH DAILY. FOR HYPERTENSION  ? ampicillin (PRINCIPEN) 500 MG capsule Take 1 capsule (500 mg total) by mouth 3 (three) times daily.  ? aspirin 81 MG chewable tablet Chew by mouth.  ? HYDROcodone-acetaminophen (NORCO/VICODIN) 5-325 MG tablet Take 1/2 to 1 tablet by mouth as needed for severe pain  ? levothyroxine (SYNTHROID) 50 MCG tablet TAKE 1 TABLET BY MOUTH EVERY MORNING 30 MINUTES PRIOR TO EATING FOOD  ? pantoprazole (PROTONIX) 40 MG tablet Take 1 tablet (40 mg total) by mouth daily.  ? simvastatin (ZOCOR) 20 MG tablet TAKE 1 TABLET BY MOUTH EVERY DAY  ? [START ON 01/08/2022] zolpidem (AMBIEN) 10 MG tablet Take 1 tablet (10 mg total) by mouth at bedtime.  ? ?No facility-administered encounter medications on file as of 12/29/2021.  ? ?Reviewed chart for medication changes ahead of medication coordination call. ? ?Hospital Visit: ?11/27/21 [facility] Lucilla Lame, MD 11/27/21 For ESOPHAGOGASTRODUODENOSCOPY. No medication changes.  ? ?Office Visit: ?12/18/21 Mylinda Latina, PA-C For Dysuria. STARTED Hydrocodone-Acetaminophen 5-325 mg 1/2  to 1 tablet by mouth as needed for severe pain. STOPPED Tramadol. CHANGED Instructions: Alprazolam 0.25 mg 1 tablet by mouth daily as needed and Zolpidem Tartrate 10 mg at bedtime. (Patient wants to keep all three medications separate) ? ?Consults:  ?12/25/21 General Surgery Pabon, Diego F, MD. For Hernia, hiatal. No medication changes.  ? ?BP Readings from Last 3 Encounters:  ?12/25/21 (!) 151/78  ?12/18/21 135/62  ?11/27/21  118/67  ?  ?No results found for: HGBA1C  ? ?Patient obtains medications through Adherence Packaging  90 Days  ? ?Last adherence delivery included:  ?N/A. ? ?Patient declined meds last month: ?N/A. ? ?Patient is due for next adherence delivery on: 01/09/22. ? ?Called patient and reviewed medications and coordinated delivery. ? ?This delivery to include: ?Aspirin 81 mg one tablet for breakfast ?Amlodipine 2.5 mg one tablet for breakfast ?Simvastatin 20 mg one tablet for breakfast ?Pantoprazole 40 mg one tablet for breakfast ?Levothyroxine 50 mcg one tablet for breakfast ? ?Patient declined the following medications: ?None.  ? ?Patient needs refills for: Requested  ?Amlodipine 2.5 mg one tablet for breakfast ?Simvastatin 20 mg one tablet for breakfast ?Pantoprazole 40 mg one tablet for breakfast ? ?Confirmed delivery date of 01/09/22, advised patient that pharmacy will contact them the morning of delivery. ? ?Time: 20 min ? ?Charlann Lange, RMA ?Healthcare Concierge  ?[phone] ? ?

## 2022-01-01 ENCOUNTER — Telehealth: Payer: Self-pay

## 2022-01-01 NOTE — Telephone Encounter (Signed)
Surgical clearance form signed by Ander Purpura and faxed back to [facility] at [phone]. Clearance placed in scan. ?

## 2022-01-02 ENCOUNTER — Telehealth: Payer: Self-pay

## 2022-01-02 ENCOUNTER — Encounter: Payer: Self-pay | Admitting: Physician Assistant

## 2022-01-02 NOTE — Telephone Encounter (Signed)
Vascular Clearance faxed to Dr Vinnie Level. [phone] ? ? ?Medical Clearance received from Dr. Ander Purpura McDonough-medium risk-patient optimized for surgery-recommends getting vascular clearance due to history of TAAA. ?

## 2022-01-02 NOTE — Telephone Encounter (Signed)
Faxed medical clearance to Dr. Humphrey Rolls on 03/20/203 at [phone]. ?

## 2022-01-04 ENCOUNTER — Telehealth: Payer: Self-pay

## 2022-01-04 DIAGNOSIS — H25811 Combined forms of age-related cataract, right eye: Secondary | ICD-10-CM | POA: Diagnosis not present

## 2022-01-04 DIAGNOSIS — H2511 Age-related nuclear cataract, right eye: Secondary | ICD-10-CM | POA: Diagnosis not present

## 2022-01-04 NOTE — Telephone Encounter (Signed)
Vascular Clearance form received from Dr.Mark Heather Roberts does not provide clearance- reach out to cardiologist ? ? ?Cardiac Clearance faxed to Dr.Susan Belgum [phone] ? ?

## 2022-01-05 ENCOUNTER — Other Ambulatory Visit: Payer: Self-pay | Admitting: Physician Assistant

## 2022-01-05 ENCOUNTER — Telehealth: Payer: Self-pay

## 2022-01-05 DIAGNOSIS — F5101 Primary insomnia: Secondary | ICD-10-CM

## 2022-01-05 DIAGNOSIS — F411 Generalized anxiety disorder: Secondary | ICD-10-CM

## 2022-01-05 NOTE — Telephone Encounter (Signed)
Cardiac Clearance received from both Dr.Farber and Dr.Belgum @ [facility] and they stated they do not provide clearances.  Medical Clearance received. ?

## 2022-01-16 ENCOUNTER — Other Ambulatory Visit: Payer: Self-pay

## 2022-01-16 ENCOUNTER — Other Ambulatory Visit
Admission: RE | Admit: 2022-01-16 | Discharge: 2022-01-16 | Disposition: A | Discharge status: Home / Self Care | Payer: Medicare HMO | Source: Ambulatory Visit | Attending: Surgery | Admitting: Surgery

## 2022-01-16 DIAGNOSIS — Z01812 Encounter for preprocedural laboratory examination: Secondary | ICD-10-CM

## 2022-01-16 HISTORY — DX: Personal history of other diseases of the digestive system: Z87.19

## 2022-01-16 NOTE — Patient Instructions (Addendum)
Your procedure is scheduled on: 01/23/22 - Tuesday ?Report to the Registration Desk on the 1st floor of the [facility]. ?To find out your arrival time, please call [phone] between 1PM - 3PM on: 01/22/22 - Monday ? ?REMEMBER: ?Instructions that are not followed completely may result in serious medical risk, up to and including death; or upon the discretion of your surgeon and anesthesiologist your surgery may need to be rescheduled. ? ?Do not eat food after midnight the night before surgery.  ?No gum chewing, lozengers or hard candies. ? ?You may however, drink CLEAR liquids up to 2 hours before you are scheduled to arrive for your surgery. Do not drink anything within 2 hours of your scheduled arrival time. ? ?Clear liquids include: ?- water  ?- apple juice without pulp ?- gatorade (not RED colors) ?- black coffee or tea (Do NOT add milk or creamers to the coffee or tea) ?Do NOT drink anything that is not on this list. ? ?TAKE THESE MEDICATIONS THE MORNING OF SURGERY WITH A SIP OF WATER: ? ?- ALPRAZolam (XANAX) 0.25 MG tablet if needed ?- amLODipine (NORVASC) 2.5 MG tablet ?- HYDROcodone-acetaminophen (NORCO/VICODIN) 5-325 MG tablet if needed ?- levothyroxine (SYNTHROID) 50 MCG tablet ?- pantoprazole (PROTONIX) 40 MG tablet, (take one the night before and one on the morning of surgery - helps to prevent nausea after surgery.) ?- simvastatin (ZOCOR) 20 MG tablet ? ?Follow recommendations from Cardiologist, Pulmonologist or PCP regarding stopping Aspirin, Coumadin, Plavix, Eliquis, Pradaxa, or Pletal. May take on up until the day before surgery but do not take the morning of. ? ?One week prior to surgery: ?Stop Anti-inflammatories (NSAIDS) such as Advil, Aleve, Ibuprofen, Motrin, Naproxen, Naprosyn and Aspirin based products such as Excedrin, Goodys Powder, BC Powder. ? ?Stop ANY OVER THE COUNTER supplements until after surgery. ? ?You may take Tylenol if needed for pain up until the day of surgery. ? ?No  Alcohol for 24 hours before or after surgery. ? ?No Smoking including e-cigarettes for 24 hours prior to surgery.  ?No chewable tobacco products for at least 6 hours prior to surgery.  ?No nicotine patches on the day of surgery. ? ?Do not use any "recreational" drugs for at least a week prior to your surgery.  ?Please be advised that the combination of cocaine and anesthesia may have negative outcomes, up to and including death. ?If you test positive for cocaine, your surgery will be cancelled. ? ?On the morning of surgery brush your teeth with toothpaste and water, you may rinse your mouth with mouthwash if you wish. ?Do not swallow any toothpaste or mouthwash. ? ?Use CHG Soap or wipes as directed on instruction sheet. ? ?Do not wear jewelry, make-up, hairpins, clips or nail polish. ? ?Do not wear lotions, powders, or perfumes.  ? ?Do not shave body from the neck down 48 hours prior to surgery just in case you cut yourself which could leave a site for infection.  ?Also, freshly shaved skin may become irritated if using the CHG soap. ? ?Contact lenses, hearing aids and dentures may not be worn into surgery. ? ?Do not bring valuables to the hospital. [facility] is not responsible for any missing/lost belongings or valuables.  ? ?Notify your doctor if there is any change in your medical condition (cold, fever, infection). ? ?Wear comfortable clothing (specific to your surgery type) to the hospital. ? ?After surgery, you can help prevent lung complications by doing breathing exercises.  ?Take deep breaths and cough every  1-2 hours. Your doctor may order a device called an Incentive Spirometer to help you take deep breaths. ?When coughing or sneezing, hold a pillow firmly against your incision with both hands. This is called ?splinting.? Doing this helps protect your incision. It also decreases belly discomfort. ? ?If you are being admitted to the hospital overnight, leave your suitcase in the car. ?After surgery it  may be brought to your room. ? ?If you are being discharged the day of surgery, you will not be allowed to drive home. ?You will need a responsible adult (18 years or older) to drive you home and stay with you that night.  ? ?If you are taking public transportation, you will need to have a responsible adult (18 years or older) with you. ?Please confirm with your physician that it is acceptable to use public transportation.  ? ?Please call the [facility] at [phone] if you have any questions about these instructions. ? ?Surgery Visitation Policy: ? ?Patients undergoing a surgery or procedure may have two family members or support persons with them as long as the person is not COVID-19 positive or experiencing its symptoms.  ? ?Inpatient Visitation:   ? ?Visiting hours are 7 a.m. to 8 p.m. ?Up to four visitors are allowed at one time in a patient room, including children. The visitors may rotate out with other people during the day. One designated support person (adult) may remain overnight.  ?

## 2022-01-17 ENCOUNTER — Other Ambulatory Visit
Admission: RE | Admit: 2022-01-17 | Discharge: 2022-01-17 | Disposition: A | Discharge status: Home / Self Care | Payer: Medicare HMO | Source: Ambulatory Visit | Attending: Surgery | Admitting: Surgery

## 2022-01-17 DIAGNOSIS — Z01818 Encounter for other preprocedural examination: Secondary | ICD-10-CM | POA: Diagnosis not present

## 2022-01-17 DIAGNOSIS — Z01812 Encounter for preprocedural laboratory examination: Secondary | ICD-10-CM

## 2022-01-17 DIAGNOSIS — Z01810 Encounter for preprocedural cardiovascular examination: Secondary | ICD-10-CM | POA: Diagnosis not present

## 2022-01-17 LAB — CBC
HCT: 42.7 % (ref 36.0–46.0)
Hemoglobin: 14.1 g/dL (ref 12.0–15.0)
MCH: 32.8 pg (ref 26.0–34.0)
MCHC: 33 g/dL (ref 30.0–36.0)
MCV: 99.3 fL (ref 80.0–100.0)
Platelets: 177 10*3/uL (ref 150–400)
RBC: 4.3 MIL/uL (ref 3.87–5.11)
RDW: 13 % (ref 11.5–15.5)
WBC: 6.5 10*3/uL (ref 4.0–10.5)
nRBC: 0 % (ref 0.0–0.2)

## 2022-01-17 LAB — BASIC METABOLIC PANEL
Anion gap: 7 (ref 5–15)
BUN: 18 mg/dL (ref 8–23)
CO2: 25 mmol/L (ref 22–32)
Calcium: 8.9 mg/dL (ref 8.9–10.3)
Chloride: 108 mmol/L (ref 98–111)
Creatinine, Ser: 1.03 mg/dL — ABNORMAL HIGH (ref 0.44–1.00)
GFR, Estimated: 59 mL/min — ABNORMAL LOW (ref >60)
Glucose, Bld: 78 mg/dL (ref 70–99)
Potassium: 4.1 mmol/L (ref 3.5–5.1)
Sodium: 140 mmol/L (ref 135–145)

## 2022-01-17 NOTE — Pre-Procedure Instructions (Signed)
Medical Clearance on chart from Dr Chauncey Cruel medium Risk ?

## 2022-01-17 NOTE — Pre-Procedure Instructions (Signed)
Progress Notes ?- documented in this encounter ?Donia Pounds, MD - 12/28/2021 1:15 PM EDT ?Formatting of this note is different from the original. ?HPI: ?  ?Ms. Joy Carpenter is a 71 y.o. 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 She was last seen in clinic on 01/05/21 and presents today for their 9 mo follow up.  ?  ?  ?She is a participant in Dr. Eddie Dibbles PS-IDE study. ?  ?Patient denies recent ER visits or hospitalizations. No changes to their medical history.  ?  ?She denies fever, chills, fatigue, poor appetite, chest pain/pressure, shortness of breath, coughing, wheezing, abdominal pain, back pain, nausea, vomiting, diarrhea, constipation, rectal bleeding, difficulty with urination, BLE edema.  ?  ?  ?RX/ ALLERGIES/ MED HX/SURG HX/ SOC HX/FAM HX: Reviewed & updated in Epic ?  ?ROS: The balance of 10 systems reviewed is negative except as noted in the HPI  ? ?General: WD, WN female in NAD. ?  ?HEENT: Normocephalic, atraumatic, anicteric sclera. ?Cardiovascular: Regular rate. ?Pulmonary: Normal work of breathing on room air. ?Abd: Soft, non-tender, non-distended. ?Musculoskeletal: Extremities warm and well perfused. No cyanosis, clubbing, or edema. ?Skin: Normal skin turgor. No rashes. ?Neurologic: Alert and oriented x 3. No obvious focal deficits. ?Psych: Appropriate affect. ?  ?PVL: No issues detected ? ?RTC: 1 yr studies ?Electronically signed by Donia Pounds, MD at 01/03/2022 10:33 AM EDT ?

## 2022-01-17 NOTE — Pre-Procedure Instructions (Signed)
Cardiac clearance faxed to Stacie Glaze at [facility] ?

## 2022-01-18 ENCOUNTER — Encounter: Payer: Self-pay | Admitting: Anesthesiology

## 2022-01-19 NOTE — Pre-Procedure Instructions (Signed)
Called Dr Rosalee Kaufman (Vascular) office @ [facility] to see if pt sees a cardiologist. His office said that pt does not see any cardiologist in the [facility] system.  Dr Sammuel Hines does give out clearances, even though Dr Barb Merino office said they do not. I faxed vascular clearance to Dr Rosalee Kaufman office with correct fax # given by office ?

## 2022-01-22 ENCOUNTER — Other Ambulatory Visit: Payer: Self-pay

## 2022-01-22 DIAGNOSIS — K449 Diaphragmatic hernia without obstruction or gangrene: Secondary | ICD-10-CM

## 2022-01-22 DIAGNOSIS — Z8679 Personal history of other diseases of the circulatory system: Secondary | ICD-10-CM

## 2022-01-22 NOTE — Pre-Procedure Instructions (Addendum)
Dennie Maizes notified at Dr Corlis Leak office that Dr Andree Elk is stating that pt cannot proceed to surgery if PCP cleared medically but wanted further w/u from vascular. Pamala Hurry states that Dr Dahlia Byes is here today and she will let him know. She was requesting Dr Andree Elk ascom # in case Pabon wanted to talk with him. Ascom # given. ?

## 2022-01-22 NOTE — Pre-Procedure Instructions (Addendum)
Nurse Practioner Anderson Malta Belgum called from [facility] and stated that they do not give out vascular clearances.  Secure chat with Dr Andree Elk who is on today to see what needs to be done since pt does not see cardiology and since vascular will not give out clearance. Awaiting response from Anesthesia ?

## 2022-01-23 ENCOUNTER — Encounter: Admission: RE | Payer: Self-pay | Source: Home / Self Care

## 2022-01-23 ENCOUNTER — Ambulatory Visit: Admission: RE | Admit: 2022-01-23 | Payer: Medicare HMO | Source: Home / Self Care | Admitting: Surgery

## 2022-01-23 SURGERY — REPAIR, HERNIA, HIATAL, ROBOT-ASSISTED
Anesthesia: General

## 2022-01-29 ENCOUNTER — Encounter: Payer: Self-pay | Admitting: Internal Medicine

## 2022-01-31 ENCOUNTER — Ambulatory Visit: Payer: Medicare HMO | Admitting: Internal Medicine

## 2022-01-31 NOTE — Progress Notes (Deleted)
Cardiology Office Note:    Date:  01/31/2022   ID:  Joy Carpenter, DOB Oct 19, 1950, MRN [id number]  PCP:  Joy Latina, PA-C   [facility] Providers Cardiologist:  None { Click to update primary MD,subspecialty MD or APP then REFRESH:1}    Referring MD: Joy Husbands, MD   No chief complaint on file. ***  History of Present Illness:    Joy Carpenter is a 71 y.o. female with a hx of ***referral for thoracic and abdominal aortic aneurysm s/p FEVAR and TEVAR and iliac extension repair      CT Abd/pelvis 11/01/2021: Vascular/Lymphatic: Status post endovascular stent graft repair of the visualized thoracic and abdominal aortic aneurysm.The stent graft appears patent. The descending thoracic aortic lumen measures 5.4 cm in diameter. Infrarenal abdominal aortic aneurysm measures 4.8 cm in diameter. No definite contrast identified outside of the confines of the stent graft. Evaluation however is limited as precontrast images are not provided. The IVC is unremarkable. Noportal venous gas. There is no adenopathy.      Past Medical History:  Diagnosis Date   Actinic keratosis 03/28/2021   right upper arm, bx proven   Anxiety    Carpal tunnel syndrome 2019   Depression    Emphysema of lung ([place])    "mild"   GERD (gastroesophageal reflux disease)    Headache    daily - stress   Herpes    History of hiatal hernia    Hyperlipidemia    Hypertension    Hypothyroidism    Insomnia    Osteoporosis    SCC (squamous cell carcinoma) 03/28/2021   left dorsal hand, EDC   Thoracic aortic aneurysm (TAA) ([place])    a.) s/p 4v FEVAR (CA, SMA, RRA, LRA) and proximal TEVAR extention with BILATERAL iliac limb extensions on 11/16/2020   Vertigo    1 episode - approx 2010    Weakness of both legs    intermittent    Past Surgical History:  Procedure Laterality Date   COLONOSCOPY     COLONOSCOPY WITH PROPOFOL N/A 06/14/2016   Procedure: COLONOSCOPY WITH PROPOFOL;  Surgeon:  Joy Lame, MD;  Location: [place];  Service: Endoscopy;  Laterality: N/A;   ESOPHAGOGASTRODUODENOSCOPY (EGD) WITH PROPOFOL N/A 06/14/2016   Procedure: ESOPHAGOGASTRODUODENOSCOPY (EGD) WITH PROPOFOL;  Surgeon: Joy Lame, MD;  Location: [place];  Service: Endoscopy;  Laterality: N/A;   ESOPHAGOGASTRODUODENOSCOPY (EGD) WITH PROPOFOL N/A 11/27/2021   Procedure: ESOPHAGOGASTRODUODENOSCOPY (EGD) WITH BIOPSY;  Surgeon: Joy Lame, MD;  Location: [place];  Service: Endoscopy;  Laterality: N/A;   HYSTEROSCOPY WITH D & C N/A 07/18/2021   Procedure: DILATATION AND CURETTAGE /HYSTEROSCOPY;  Surgeon: Joy Mood, MD;  Location: [facility];  Service: Gynecology;  Laterality: N/A;   THORACIC AORTIC ANEURYSM REPAIR N/A 11/16/2020   Procedure: 4V FEVAR (CA, SMA, RRA, LRA) WITH PROXIMAL TEVAR EXTENSION WITH BILATERAL ILIAC LIMB EXTENSIONS; Location: [facility]; Surgeon; Joy Level, MD   TUBAL LIGATION      Current Medications: No outpatient medications have been marked as taking for the 01/31/22 encounter (Appointment) with Joy Mayo, MD.     Allergies:   Patient has no known allergies.   Social History   Socioeconomic History   Marital status: Divorced    Spouse name: Not on file   Number of children: Not on file   Years of education: Not on file   Highest education Carpenter: Not on file  Occupational History   Not on file  Tobacco Use   Smoking status: Every Day    Packs/day: 1.00    Years: 30.00    Pack years: 30.00    Types: Cigarettes   Smokeless tobacco: Never  Vaping Use   Vaping Use: Never used  Substance and Sexual Activity   Alcohol use: No   Drug use: Never   Sexual activity: Not Currently    Birth control/protection: Post-menopausal  Other Topics Concern   Not on file  Social History Narrative   Lives in [place] with grand-daughter, currently staying with boyfriend   Social Determinants of Health   Financial Resource Strain: Not on  file  Food Insecurity: Not on file  Transportation Needs: Not on file  Physical Activity: Not on file  Stress: Not on file  Social Connections: Not on file     Family History: The patient's ***family history includes Aneurysm in an other family member; Leukemia in her mother.  ROS:   Please see the history of present illness.    *** All other systems reviewed and are negative.  EKGs/Labs/Other Studies Reviewed:    The following studies were reviewed today: ***  EKG:  EKG is *** ordered today.  The ekg ordered today demonstrates ***  Recent Labs: 09/14/2021: ALT 6; TSH 0.778 01/17/2022: BUN 18; Creatinine, Ser 1.03; Hemoglobin 14.1; Platelets 177; Potassium 4.1; Sodium 140  Recent Lipid Panel    Component Value Date/Time   CHOL 199 09/14/2021 1410   TRIG 101 09/14/2021 1410   HDL 54 09/14/2021 1410   CHOLHDL 3.3 06/01/2019 0926   LDLCALC 127 (H) 09/14/2021 1410     Risk Assessment/Calculations:   {Does this patient have ATRIAL FIBRILLATION?:[phone]}       Physical Exam:    VS:  There were no vitals taken for this visit.    Wt Readings from Last 3 Encounters:  12/25/21 130 lb 12.8 oz (59.3 kg)  12/18/21 130 lb 9.6 oz (59.2 kg)  11/27/21 129 lb (58.5 kg)     GEN: *** Well nourished, well developed in no acute distress HEENT: Normal NECK: No JVD; No carotid bruits LYMPHATICS: No lymphadenopathy CARDIAC: ***RRR, no murmurs, rubs, gallops RESPIRATORY:  Clear to auscultation without rales, wheezing or rhonchi  ABDOMEN: Soft, non-tender, non-distended MUSCULOSKELETAL:  No edema; No deformity  SKIN: Warm and dry NEUROLOGIC:  Alert and oriented x 3 PSYCHIATRIC:  Normal affect   ASSESSMENT:    Pre-Op: Otherwise from a cardiac perspective, she can do > 4 mets without limitations. No further cardiac studies recommended. Blood pressure ***.She is acceptable cardiac risk. She sees vascular surgery, I can will only provide recommendations from a cardiac  perspective. PLAN:    In order of problems listed above:  ***      {Are you ordering a CV Procedure (e.g. stress test, cath, DCCV, TEE, etc)?   Press F2        :[id number]}    Medication Adjustments/Labs and Tests Ordered: Current medicines are reviewed at length with the patient today.  Concerns regarding medicines are outlined above.  No orders of the defined types were placed in this encounter.  No orders of the defined types were placed in this encounter.   There are no Patient Instructions on file for this visit.   Signed, Joy Mayo, MD  01/31/2022 2:03 PM    [place]

## 2022-03-02 ENCOUNTER — Other Ambulatory Visit: Payer: Self-pay | Admitting: Physician Assistant

## 2022-03-02 DIAGNOSIS — F411 Generalized anxiety disorder: Secondary | ICD-10-CM

## 2022-03-06 ENCOUNTER — Telehealth: Payer: Self-pay | Admitting: Surgery

## 2022-03-06 NOTE — Telephone Encounter (Signed)
Multiple messages have been left for the patient to call out office, no return call has yet to be made to date.  Patient in the past was scheduled for hiatal hernia, but needed cardiac clearance.  Calling to see where patient is at with this of if she has scheduled for cardiology.  If patient calls back will need to check with her to see if she has seen cardiology and if so will need follow up with Dr. Dahlia Byes to discuss the possibility of rescheduling surgery.

## 2022-03-19 ENCOUNTER — Ambulatory Visit (INDEPENDENT_AMBULATORY_CARE_PROVIDER_SITE_OTHER): Payer: Medicare HMO | Admitting: Physician Assistant

## 2022-03-19 ENCOUNTER — Encounter: Payer: Self-pay | Admitting: Physician Assistant

## 2022-03-19 DIAGNOSIS — I10 Essential (primary) hypertension: Secondary | ICD-10-CM

## 2022-03-19 DIAGNOSIS — D229 Melanocytic nevi, unspecified: Secondary | ICD-10-CM

## 2022-03-19 DIAGNOSIS — K449 Diaphragmatic hernia without obstruction or gangrene: Secondary | ICD-10-CM

## 2022-03-19 NOTE — Progress Notes (Signed)
[facility] [place], [place] [address]  Internal MEDICINE  Office Visit Note  Patient Name: Joy Carpenter  [id number]  [id number]  Date of Service: 03/19/2022  Chief Complaint  Patient presents with   Follow-up   Gastroesophageal Reflux   Depression   Hyperlipidemia   Hypertension   Breast Problem    Has mole on right breast, has grown in size    HPI Pt is here for routine follow up -had planned to have hiatal hernia repair, but was cancelled the day of due to needing cardiology preop clearance. -needs to call back to be set up with cardiology for clearance before surgery can be rescheduled. -States she does not have reflux symptoms, but she still has abdominal pain that may or may not be due to hernia -Boyfriend is in a clinical trial for cancer and had allergic reaction to drug so things have been hectic -mole on right breast that has gotten bigger over the years and due for overall skin check as she has previously had places removed. She will contact her dermatologist as it has only been about a year since she was seen. She will call if new referral needed  Current Medication: Outpatient Encounter Medications as of 03/19/2022  Medication Sig   ALPRAZolam (XANAX) 0.25 MG tablet TAKE 1 TABLET BY MOUTH DAILY AS NEEDED FOR ANXIETY   amLODipine (NORVASC) 2.5 MG tablet TAKE 1 TABLET (2.5 MG TOTAL) BY MOUTH DAILY. FOR HYPERTENSION   ampicillin (PRINCIPEN) 500 MG capsule Take 1 capsule (500 mg total) by mouth 3 (three) times daily.   aspirin 81 MG chewable tablet Chew by mouth.   Aspirin-Caffeine 845-65 MG PACK Take 1 Dose by mouth as needed.   HYDROcodone-acetaminophen (NORCO/VICODIN) 5-325 MG tablet Take 1/2 to 1 tablet by mouth as needed for severe pain   levothyroxine (SYNTHROID) 50 MCG tablet TAKE 1 TABLET BY MOUTH EVERY MORNING 30 MINUTES PRIOR TO EATING FOOD   pantoprazole (PROTONIX) 40 MG tablet Take 1 tablet (40 mg total) by mouth daily.    simvastatin (ZOCOR) 20 MG tablet TAKE 1 TABLET BY MOUTH EVERY DAY   zolpidem (AMBIEN) 10 MG tablet Take 1 tablet (10 mg total) by mouth at bedtime.   No facility-administered encounter medications on file as of 03/19/2022.    Surgical History: Past Surgical History:  Procedure Laterality Date   COLONOSCOPY     COLONOSCOPY WITH PROPOFOL N/A 06/14/2016   Procedure: COLONOSCOPY WITH PROPOFOL;  Surgeon: Lucilla Lame, MD;  Location: [place];  Service: Endoscopy;  Laterality: N/A;   ESOPHAGOGASTRODUODENOSCOPY (EGD) WITH PROPOFOL N/A 06/14/2016   Procedure: ESOPHAGOGASTRODUODENOSCOPY (EGD) WITH PROPOFOL;  Surgeon: Lucilla Lame, MD;  Location: [place];  Service: Endoscopy;  Laterality: N/A;   ESOPHAGOGASTRODUODENOSCOPY (EGD) WITH PROPOFOL N/A 11/27/2021   Procedure: ESOPHAGOGASTRODUODENOSCOPY (EGD) WITH BIOPSY;  Surgeon: Lucilla Lame, MD;  Location: [place];  Service: Endoscopy;  Laterality: N/A;   HYSTEROSCOPY WITH D & C N/A 07/18/2021   Procedure: DILATATION AND CURETTAGE /HYSTEROSCOPY;  Surgeon: Malachy Mood, MD;  Location: [facility];  Service: Gynecology;  Laterality: N/A;   THORACIC AORTIC ANEURYSM REPAIR N/A 11/16/2020   Procedure: 4V FEVAR (CA, SMA, RRA, LRA) WITH PROXIMAL TEVAR EXTENSION WITH BILATERAL ILIAC LIMB EXTENSIONS; Location: [facility]; Surgeon; Vinnie Level, MD   TUBAL LIGATION      Medical History: Past Medical History:  Diagnosis Date   Actinic keratosis 03/28/2021   right upper arm, bx proven   Anxiety  Carpal tunnel syndrome 2019   Depression    Emphysema of lung ([place])    "mild"   GERD (gastroesophageal reflux disease)    Headache    daily - stress   Herpes    History of hiatal hernia    Hyperlipidemia    Hypertension    Hypothyroidism    Insomnia    Osteoporosis    SCC (squamous cell carcinoma) 03/28/2021   left dorsal hand, EDC   Thoracic aortic aneurysm (TAA) ([facility])    a.) s/p 4v FEVAR (CA, SMA, RRA, LRA) and proximal TEVAR  extention with BILATERAL iliac limb extensions on 11/16/2020   Vertigo    1 episode - approx 2010    Weakness of both legs    intermittent    Family History: Family History  Problem Relation Age of Onset   Leukemia Mother    Aneurysm Other     Social History   Socioeconomic History   Marital status: Divorced    Spouse name: Not on file   Number of children: Not on file   Years of education: Not on file   Highest education level: Not on file  Occupational History   Not on file  Tobacco Use   Smoking status: Every Day    Packs/day: 1.00    Years: 30.00    Total pack years: 30.00    Types: Cigarettes   Smokeless tobacco: Never   Tobacco comments:    1 pack daily  Vaping Use   Vaping Use: Never used  Substance and Sexual Activity   Alcohol use: No   Drug use: Never   Sexual activity: Not Currently    Birth control/protection: Post-menopausal  Other Topics Concern   Not on file  Social History Narrative   Lives in [place] with grand-daughter, currently staying with boyfriend   Social Determinants of Health   Financial Resource Strain: Not on file  Food Insecurity: Not on file  Transportation Needs: Not on file  Physical Activity: Not on file  Stress: Not on file  Social Connections: Not on file  Intimate Partner Violence: Not on file      Review of Systems  Constitutional:  Negative for chills, fatigue and unexpected weight change.  HENT:  Negative for congestion, postnasal drip, rhinorrhea, sneezing and sore throat.   Eyes:  Negative for redness.  Respiratory:  Negative for cough, chest tightness and shortness of breath.   Cardiovascular:  Negative for chest pain and palpitations.  Gastrointestinal:  Positive for abdominal pain. Negative for constipation, diarrhea, nausea and vomiting.  Genitourinary:  Negative for dysuria and frequency.  Musculoskeletal:  Negative for arthralgias, back pain, joint swelling and neck pain.  Skin:  Negative for rash.        Mole on right breast  Neurological: Negative.  Negative for tremors and numbness.  Hematological:  Negative for adenopathy. Does not bruise/bleed easily.  Psychiatric/Behavioral:  Negative for behavioral problems (Depression), sleep disturbance and suicidal ideas. The patient is not nervous/anxious.     Vital Signs: BP 140/68   Pulse 89   Temp 98.4 F (36.9 C)   Resp 16   Ht '5\' 7"'$  (1.702 m)   Wt 130 lb (59 kg)   SpO2 98%   BMI 20.36 kg/m    Physical Exam Vitals and nursing note reviewed.  Constitutional:      General: She is not in acute distress.    Appearance: She is well-developed and normal weight. She is not diaphoretic.  HENT:  Head: Normocephalic and atraumatic.     Mouth/Throat:     Pharynx: No oropharyngeal exudate.  Eyes:     Pupils: Pupils are equal, round, and reactive to light.  Neck:     Thyroid: No thyromegaly.     Vascular: No JVD.     Trachea: No tracheal deviation.  Cardiovascular:     Rate and Rhythm: Normal rate and regular rhythm.     Heart sounds: Normal heart sounds. No murmur heard.    No friction rub. No gallop.  Pulmonary:     Effort: Pulmonary effort is normal. No respiratory distress.     Breath sounds: No wheezing or rales.  Chest:     Chest wall: No tenderness.  Abdominal:     General: Bowel sounds are normal.     Palpations: Abdomen is soft.  Musculoskeletal:        General: Normal range of motion.     Cervical back: Normal range of motion and neck supple.  Lymphadenopathy:     Cervical: No cervical adenopathy.  Skin:    General: Skin is warm and dry.     Findings: Lesion present.     Comments: Dark mole on right breast-uniform borders, but growing in size  Neurological:     Mental Status: She is alert and oriented to person, place, and time.     Cranial Nerves: No cranial nerve deficit.  Psychiatric:        Mood and Affect: Mood normal.        Behavior: Behavior normal.        Thought Content: Thought content normal.         Judgment: Judgment normal.        Assessment/Plan: 1. Enlarged skin mole Will contact dermatologist to schedule appt to evaluate changing mole as well as general skin check. Will contact office if new referral needed  2. Essential hypertension Stable, continue current medications  3. Hernia, hiatal Will contact [facility] about rescheduling procedure and for scheduling cardiology clearance   General Counseling: denise washburn understanding of the findings of todays visit and agrees with plan of treatment. I have discussed any further diagnostic evaluation that may be needed or ordered today. We also reviewed her medications today. she has been encouraged to call the office with any questions or concerns that should arise related to todays visit.    No orders of the defined types were placed in this encounter.   No orders of the defined types were placed in this encounter.   This patient was seen by Drema Dallas, PA-C in collaboration with Dr. Clayborn Bigness as a part of collaborative care agreement.   Total time spent:30 Minutes Time spent includes review of chart, medications, test results, and follow up plan with the patient.      Dr Lavera Guise Internal medicine

## 2022-03-26 ENCOUNTER — Other Ambulatory Visit: Payer: Self-pay

## 2022-03-26 DIAGNOSIS — E039 Hypothyroidism, unspecified: Secondary | ICD-10-CM

## 2022-03-26 MED ORDER — LEVOTHYROXINE SODIUM 50 MCG PO TABS: 50.0000 ug | ORAL_TABLET | Freq: Every day | ORAL | 1 refills | Status: DC

## 2022-04-02 ENCOUNTER — Other Ambulatory Visit: Payer: Self-pay | Admitting: Physician Assistant

## 2022-04-02 DIAGNOSIS — F5101 Primary insomnia: Secondary | ICD-10-CM

## 2022-04-18 ENCOUNTER — Telehealth: Payer: Self-pay

## 2022-04-18 ENCOUNTER — Other Ambulatory Visit: Payer: Self-pay | Admitting: Physician Assistant

## 2022-04-18 DIAGNOSIS — R109 Unspecified abdominal pain: Secondary | ICD-10-CM

## 2022-04-18 MED ORDER — HYDROCODONE-ACETAMINOPHEN 5-325 MG PO TABS
ORAL_TABLET | ORAL | 0 refills | Status: DC
Start: 2022-04-18 — End: 2022-06-04

## 2022-04-18 NOTE — Telephone Encounter (Signed)
Pt advised we send med just for as needed

## 2022-04-30 ENCOUNTER — Other Ambulatory Visit: Payer: Self-pay | Admitting: Physician Assistant

## 2022-04-30 DIAGNOSIS — F411 Generalized anxiety disorder: Secondary | ICD-10-CM

## 2022-04-30 DIAGNOSIS — I714 Abdominal aortic aneurysm, without rupture, unspecified: Secondary | ICD-10-CM

## 2022-04-30 MED ORDER — ALPRAZOLAM 0.25 MG PO TABS: ORAL_TABLET | ORAL | 1 refills | Status: DC

## 2022-06-04 ENCOUNTER — Ambulatory Visit: Payer: Medicare HMO | Admitting: Physician Assistant

## 2022-06-04 ENCOUNTER — Telehealth: Payer: Self-pay

## 2022-06-04 ENCOUNTER — Other Ambulatory Visit: Payer: Self-pay | Admitting: Physician Assistant

## 2022-06-04 MED ORDER — TRAMADOL HCL 50 MG PO TABS: 50.0000 mg | ORAL_TABLET | Freq: Every day | ORAL | 0 refills | Status: DC | PRN

## 2022-06-04 NOTE — Telephone Encounter (Signed)
Pt advised that we send tramadol

## 2022-06-18 ENCOUNTER — Ambulatory Visit: Payer: Medicare HMO | Admitting: Physician Assistant

## 2022-06-21 ENCOUNTER — Encounter: Payer: Self-pay | Admitting: Physician Assistant

## 2022-06-21 ENCOUNTER — Ambulatory Visit (INDEPENDENT_AMBULATORY_CARE_PROVIDER_SITE_OTHER): Payer: Medicare HMO | Admitting: Physician Assistant

## 2022-06-21 ENCOUNTER — Telehealth: Payer: Self-pay | Admitting: Physician Assistant

## 2022-06-21 DIAGNOSIS — Z0001 Encounter for general adult medical examination with abnormal findings: Secondary | ICD-10-CM

## 2022-06-21 DIAGNOSIS — E785 Hyperlipidemia, unspecified: Secondary | ICD-10-CM

## 2022-06-21 DIAGNOSIS — Z1231 Encounter for screening mammogram for malignant neoplasm of breast: Secondary | ICD-10-CM

## 2022-06-21 DIAGNOSIS — E039 Hypothyroidism, unspecified: Secondary | ICD-10-CM | POA: Diagnosis not present

## 2022-06-21 DIAGNOSIS — F411 Generalized anxiety disorder: Secondary | ICD-10-CM

## 2022-06-21 DIAGNOSIS — I10 Essential (primary) hypertension: Secondary | ICD-10-CM | POA: Diagnosis not present

## 2022-06-21 DIAGNOSIS — R300 Dysuria: Secondary | ICD-10-CM | POA: Diagnosis not present

## 2022-06-21 DIAGNOSIS — G8929 Other chronic pain: Secondary | ICD-10-CM

## 2022-06-21 DIAGNOSIS — N1831 Chronic kidney disease, stage 3a: Secondary | ICD-10-CM | POA: Diagnosis not present

## 2022-06-21 DIAGNOSIS — R1032 Left lower quadrant pain: Secondary | ICD-10-CM

## 2022-06-21 DIAGNOSIS — F17219 Nicotine dependence, cigarettes, with unspecified nicotine-induced disorders: Secondary | ICD-10-CM

## 2022-06-21 DIAGNOSIS — R634 Abnormal weight loss: Secondary | ICD-10-CM

## 2022-06-21 DIAGNOSIS — F5101 Primary insomnia: Secondary | ICD-10-CM

## 2022-06-21 DIAGNOSIS — R5383 Other fatigue: Secondary | ICD-10-CM

## 2022-06-21 MED ORDER — TRAMADOL HCL 50 MG PO TABS: 50.0000 mg | ORAL_TABLET | Freq: Every day | ORAL | 0 refills | Status: DC | PRN

## 2022-06-21 MED ORDER — ZOLPIDEM TARTRATE 10 MG PO TABS: ORAL_TABLET | ORAL | 2 refills | Status: DC

## 2022-06-21 NOTE — Telephone Encounter (Signed)
Awaiting 06/21/22 office notes for Nephrology referral-Toni

## 2022-06-21 NOTE — Telephone Encounter (Signed)
Awaiting 06/21/22 office notes for chest CT authorization-Toni

## 2022-06-21 NOTE — Progress Notes (Unsigned)
[facility] [place], [place] [address]  Internal MEDICINE  Office Visit Note  Patient Name: Joy Carpenter  [id number]  [id number]  Date of Service: 06/27/2022  Chief Complaint  Patient presents with   Medicare Wellness   Depression   Gastroesophageal Reflux   Hypertension   Hyperlipidemia     HPI Pt is here for routine health maintenance examination -Has lost 7 lbs since last visit unintentionally -Did see vascular and they did updated scans and was told her left kidney was smaller on recent imaging. Also did labs which have shown slightly elevated creatinine and some fluctuation in GFR. Discussed establishing with nephrology due to these changes. Did also have some protein in urine in past as well but this had normalized past few checks. -lower left quadrant pain still there and and some flank pain at times when working -has not followed back up on hiatal hernia and doesn't attribute pain she is experiencing to this. LLQ pain also evaluated by OBGYN previously and findings determined to not be contributing to pain. Vascular also does not think grafts/surgical repair contributing to this pain either. -taking 1/2 tramadol as needed, sometimes will be worse and may take the other 1/2. Understands to be cautious with use, especially with her other medications. -Smoking 1ppd, started at age 13 and is due for lung cancer screening CT chest, especially with weight loss -She does also have mold on right breast and was referred to dermatology last visit and will schedule this to have it evaluated as well -Due to recheck labs not done by vascular, including thyroid as this could contribute to weight changes  Current Medication: Outpatient Encounter Medications as of 06/21/2022  Medication Sig   amLODipine (NORVASC) 2.5 MG tablet TAKE 1 TABLET (2.5 MG TOTAL) BY MOUTH DAILY. FOR HYPERTENSION   ampicillin (PRINCIPEN) 500 MG capsule Take 1 capsule (500 mg total) by  mouth 3 (three) times daily.   aspirin 81 MG chewable tablet Chew by mouth.   Aspirin-Caffeine 845-65 MG PACK Take 1 Dose by mouth as needed.   levothyroxine (SYNTHROID) 50 MCG tablet Take 1 tablet (50 mcg total) by mouth daily before breakfast.   simvastatin (ZOCOR) 20 MG tablet TAKE 1 TABLET BY MOUTH EVERY DAY   [DISCONTINUED] ALPRAZolam (XANAX) 0.25 MG tablet TAKE 1 TABLET BY MOUTH DAILY AS NEEDED FOR ANXIETY   [DISCONTINUED] pantoprazole (PROTONIX) 40 MG tablet Take 1 tablet (40 mg total) by mouth daily.   [DISCONTINUED] traMADol (ULTRAM) 50 MG tablet Take 1 tablet (50 mg total) by mouth daily as needed.   [DISCONTINUED] zolpidem (AMBIEN) 10 MG tablet TAKE 1 TABLET BY MOUTH EVERYDAY AT BEDTIME   [START ON 07/03/2022] traMADol (ULTRAM) 50 MG tablet Take 1 tablet (50 mg total) by mouth daily as needed.   zolpidem (AMBIEN) 10 MG tablet TAKE 1 TABLET BY MOUTH EVERYDAY AT BEDTIME   No facility-administered encounter medications on file as of 06/21/2022.    Surgical History: Past Surgical History:  Procedure Laterality Date   COLONOSCOPY     COLONOSCOPY WITH PROPOFOL N/A 06/14/2016   Procedure: COLONOSCOPY WITH PROPOFOL;  Surgeon: Lucilla Lame, MD;  Location: [facility];  Service: Endoscopy;  Laterality: N/A;   ESOPHAGOGASTRODUODENOSCOPY (EGD) WITH PROPOFOL N/A 06/14/2016   Procedure: ESOPHAGOGASTRODUODENOSCOPY (EGD) WITH PROPOFOL;  Surgeon: Lucilla Lame, MD;  Location: [facility];  Service: Endoscopy;  Laterality: N/A;   ESOPHAGOGASTRODUODENOSCOPY (EGD) WITH PROPOFOL N/A 11/27/2021   Procedure: ESOPHAGOGASTRODUODENOSCOPY (EGD) WITH BIOPSY;  Surgeon: Allen Norris,  Darren, MD;  Location: [facility];  Service: Endoscopy;  Laterality: N/A;   HYSTEROSCOPY WITH D & C N/A 07/18/2021   Procedure: DILATATION AND CURETTAGE /HYSTEROSCOPY;  Surgeon: Malachy Mood, MD;  Location: [facility];  Service: Gynecology;  Laterality: N/A;   THORACIC AORTIC ANEURYSM REPAIR N/A 11/16/2020    Procedure: 4V FEVAR (CA, SMA, RRA, LRA) WITH PROXIMAL TEVAR EXTENSION WITH BILATERAL ILIAC LIMB EXTENSIONS; Location: [facility]; Surgeon; Vinnie Level, MD   TUBAL LIGATION      Medical History: Past Medical History:  Diagnosis Date   Actinic keratosis 03/28/2021   right upper arm, bx proven   Anxiety    Carpal tunnel syndrome 2019   Depression    Emphysema of lung ([place])    "mild"   GERD (gastroesophageal reflux disease)    Headache    daily - stress   Herpes    History of hiatal hernia    Hyperlipidemia    Hypertension    Hypothyroidism    Insomnia    Osteoporosis    SCC (squamous cell carcinoma) 03/28/2021   left dorsal hand, [facility]   Thoracic aortic aneurysm (TAA) ([facility])    a.) s/p 4v FEVAR (CA, SMA, RRA, LRA) and proximal TEVAR extention with BILATERAL iliac limb extensions on 11/16/2020   Vertigo    1 episode - approx 2010    Weakness of both legs    intermittent    Family History: Family History  Problem Relation Age of Onset   Leukemia Mother    Aneurysm Other       Review of Systems  Constitutional:  Positive for unexpected weight change. Negative for chills and fatigue.  HENT:  Negative for congestion, postnasal drip, rhinorrhea, sneezing and sore throat.   Eyes:  Negative for redness.  Respiratory:  Negative for cough, chest tightness and shortness of breath.   Cardiovascular:  Negative for chest pain and palpitations.  Gastrointestinal:  Positive for abdominal pain. Negative for constipation, diarrhea, nausea and vomiting.  Genitourinary:  Negative for dysuria and frequency.  Musculoskeletal:  Negative for arthralgias, back pain, joint swelling and neck pain.  Skin:  Negative for rash.       Mole on right breast  Neurological: Negative.  Negative for tremors and numbness.  Hematological:  Negative for adenopathy. Does not bruise/bleed easily.  Psychiatric/Behavioral:  Negative for behavioral problems (Depression), sleep disturbance and suicidal ideas. The  patient is not nervous/anxious.      Vital Signs: BP 127/77   Pulse 96   Temp 98.2 F (36.8 C)   Resp 16   Ht '5\' 7"'$  (1.702 m)   Wt 123 lb 6.4 oz (56 kg)   SpO2 99%   BMI 19.33 kg/m    Physical Exam Vitals and nursing note reviewed.  Constitutional:      General: She is not in acute distress.    Appearance: She is well-developed and normal weight. She is not diaphoretic.  HENT:     Head: Normocephalic and atraumatic.     Mouth/Throat:     Pharynx: No oropharyngeal exudate.  Eyes:     Pupils: Pupils are equal, round, and reactive to light.  Neck:     Thyroid: No thyromegaly.     Vascular: No JVD.     Trachea: No tracheal deviation.  Cardiovascular:     Rate and Rhythm: Normal rate and regular rhythm.     Heart sounds: Normal heart sounds. No murmur heard.    No friction rub. No gallop.  Pulmonary:     Effort: Pulmonary effort is normal. No respiratory distress.     Breath sounds: No wheezing or rales.  Chest:     Chest wall: No tenderness.  Abdominal:     General: Bowel sounds are normal.     Palpations: Abdomen is soft.  Musculoskeletal:        General: Normal range of motion.     Cervical back: Normal range of motion and neck supple.  Lymphadenopathy:     Cervical: No cervical adenopathy.  Skin:    General: Skin is warm and dry.     Findings: Lesion present.     Comments: Dark mole on right breast-uniform borders, but growing in size  Neurological:     Mental Status: She is alert and oriented to person, place, and time.     Cranial Nerves: No cranial nerve deficit.  Psychiatric:        Mood and Affect: Mood normal.        Behavior: Behavior normal.        Thought Content: Thought content normal.        Judgment: Judgment normal.      LABS: Recent Results (from the past 2160 hour(s))  UA/M w/rflx Culture, Routine     Status: Abnormal   Collection Time: 06/21/22  2:04 PM   Specimen: Urine   Urine  Result Value Ref Range   Specific Gravity, UA  1.020 1.005 - 1.030   pH, UA 5.5 5.0 - 7.5   Color, UA Yellow Yellow   Appearance Ur Clear Clear   Leukocytes,UA Trace (A) Negative   Protein,UA Negative Negative/Trace   Glucose, UA Negative Negative   Ketones, UA Negative Negative   RBC, UA Negative Negative   Bilirubin, UA Negative Negative   Urobilinogen, Ur 0.2 0.2 - 1.0 mg/dL   Nitrite, UA Negative Negative   Microscopic Examination See below:     Comment: Microscopic was indicated and was performed.   Urinalysis Reflex Comment     Comment: This specimen has reflexed to a Urine Culture.  Microscopic Examination     Status: Abnormal   Collection Time: 06/21/22  2:04 PM   Urine  Result Value Ref Range   WBC, UA 6-10 (A) 0 - 5 /hpf   RBC, Urine 0-2 0 - 2 /hpf   Epithelial Cells (non renal) 0-10 0 - 10 /hpf   Casts None seen None seen /lpf   Bacteria, UA Few None seen/Few  Urine Culture, Reflex     Status: None   Collection Time: 06/21/22  2:04 PM   Urine  Result Value Ref Range   Urine Culture, Routine Final report    Organism ID, Bacteria Comment     Comment: Mixed urogenital flora Less than 10,000 colonies/mL        Assessment/Plan: 1. Encounter for general adult medical examination with abnormal findings CPE performed, routine labs ordered, mammogram due  2. Essential hypertension Stable, continue current medication  3. Generalized anxiety disorder May continue xanax only as needed  4. Primary insomnia May continue ambien as needed - zolpidem (AMBIEN) 10 MG tablet; TAKE 1 TABLET BY MOUTH EVERYDAY AT BEDTIME  Dispense: 30 tablet; Refill: 2  5. Acquired hypothyroidism Wil recheck labs and adjust synthroid as indicated - TSH + free T4  6. Chronic LLQ pain May take 1/2 tramadol as needed. Unknown cause of pain, will refer to nephrology due to recent imaging showing change in left kidney size and some left flank  pain with the LLQ pain at times.  - Ambulatory referral to Nephrology - traMADol (ULTRAM) 50 MG  tablet; Take 1 tablet (50 mg total) by mouth daily as needed.  Dispense: 30 tablet; Refill: 0  7. Stage 3a chronic kidney disease ([place]) - Ambulatory referral to Nephrology  8. Weight loss, unintentional Will order lung cancer screening CT chest and patient will also move forward with dermatology screening for mole changing size. Will check thyroid as well - Ambulatory referral to Nephrology - TSH + free T4  9. Cigarette nicotine dependence with nicotine-induced disorder Will order lung cancer screening Ct Chest, not ready to quit  10. Hyperlipidemia, unspecified hyperlipidemia type Continue simvastatin and will update labs - Lipid Panel With LDL/HDL Ratio  11. Other fatigue - CBC w/Diff/Platelet  12. Visit for screening mammogram - MM 3D SCREEN BREAST BILATERAL; Future  13. Dysuria - UA/M w/rflx Culture, Routine - Microscopic Examination - Urine Culture, Reflex   General Counseling: shanise balch understanding of the findings of todays visit and agrees with plan of treatment. I have discussed any further diagnostic evaluation that may be needed or ordered today. We also reviewed her medications today. she has been encouraged to call the office with any questions or concerns that should arise related to todays visit.    Counseling:    Orders Placed This Encounter  Procedures   Microscopic Examination   Urine Culture, Reflex   MM 3D SCREEN BREAST BILATERAL   UA/M w/rflx Culture, Routine   TSH + free T4   Lipid Panel With LDL/HDL Ratio   CBC w/Diff/Platelet   Ambulatory referral to Nephrology    Meds ordered this encounter  Medications   traMADol (ULTRAM) 50 MG tablet    Sig: Take 1 tablet (50 mg total) by mouth daily as needed.    Dispense:  30 tablet    Refill:  0   zolpidem (AMBIEN) 10 MG tablet    Sig: TAKE 1 TABLET BY MOUTH EVERYDAY AT BEDTIME    Dispense:  30 tablet    Refill:  2    Not to exceed 4 additional fills before 06/16/2022    This patient  was seen by Drema Dallas, PA-C in collaboration with Dr. Clayborn Bigness as a part of collaborative care agreement.  Total time spent:40 Minutes  Time spent includes review of chart, medications, test results, and follow up plan with the patient.     Lavera Guise, MD  Internal Medicine

## 2022-06-22 ENCOUNTER — Telehealth: Payer: Self-pay

## 2022-06-22 ENCOUNTER — Other Ambulatory Visit: Payer: Self-pay | Admitting: Physician Assistant

## 2022-06-22 DIAGNOSIS — F411 Generalized anxiety disorder: Secondary | ICD-10-CM

## 2022-06-22 NOTE — Telephone Encounter (Signed)
Pt called and was requesting her refill for her XANAX.  The rx was already sent for 06/26/22 and per Lauren she approved for pt to get early refill due to [place] and needed [place] on the 06/24/22.  Spoke to [facility] and gave them the ok to fill early on XANAX Pt is aware of approval

## 2022-06-23 ENCOUNTER — Other Ambulatory Visit: Payer: Self-pay | Admitting: Physician Assistant

## 2022-06-23 DIAGNOSIS — K219 Gastro-esophageal reflux disease without esophagitis: Secondary | ICD-10-CM

## 2022-06-23 DIAGNOSIS — R109 Unspecified abdominal pain: Secondary | ICD-10-CM

## 2022-06-23 DIAGNOSIS — Z79899 Other long term (current) drug therapy: Secondary | ICD-10-CM

## 2022-06-23 DIAGNOSIS — E559 Vitamin D deficiency, unspecified: Secondary | ICD-10-CM

## 2022-06-23 DIAGNOSIS — R5383 Other fatigue: Secondary | ICD-10-CM

## 2022-06-23 DIAGNOSIS — Z124 Encounter for screening for malignant neoplasm of cervix: Secondary | ICD-10-CM

## 2022-06-23 DIAGNOSIS — Z0001 Encounter for general adult medical examination with abnormal findings: Secondary | ICD-10-CM

## 2022-06-23 DIAGNOSIS — F5101 Primary insomnia: Secondary | ICD-10-CM

## 2022-06-23 DIAGNOSIS — F411 Generalized anxiety disorder: Secondary | ICD-10-CM

## 2022-06-23 DIAGNOSIS — Z1239 Encounter for other screening for malignant neoplasm of breast: Secondary | ICD-10-CM

## 2022-06-23 DIAGNOSIS — R300 Dysuria: Secondary | ICD-10-CM

## 2022-06-24 IMAGING — CT CT ABD-PELV W/ CM
2 of 5 series · 15 of 46 positions shown, 17 images · IV contrast (agent unspecified)
Comparison: CT abdomen pelvis dated 03/02/2020.

CLINICAL DATA: Possible hiatal hernia.

EXAM:
CT ABDOMEN AND PELVIS WITH CONTRAST
TECHNIQUE: Multidetector CT imaging of the abdomen and pelvis was performed
using the standard protocol following bolus administration of
intravenous contrast.

[Series 2: abd pelvis (person_name) 5.00 · axial · 0.61mm/px · z∈[-1537,-1137]mm · 12 of 92 slices shown, 14 images]
[im 6/92  soft-tissue]
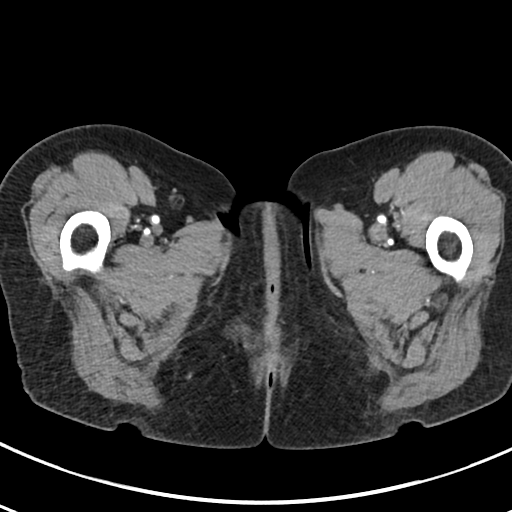
[im 6/92  bone]
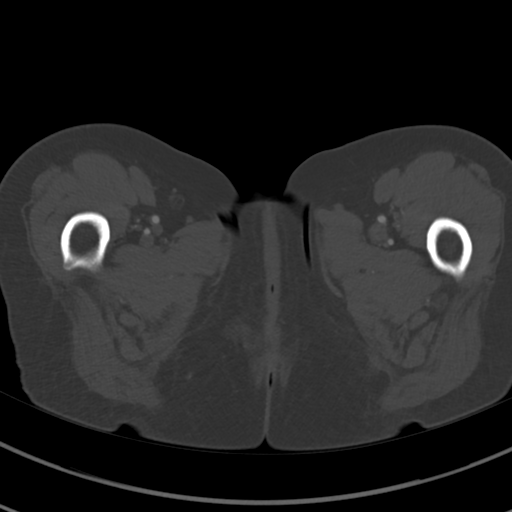
[im 16/92  soft-tissue]
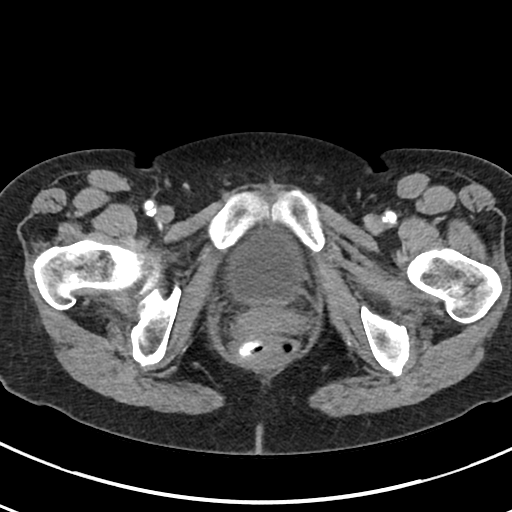
[im 21/92  soft-tissue]
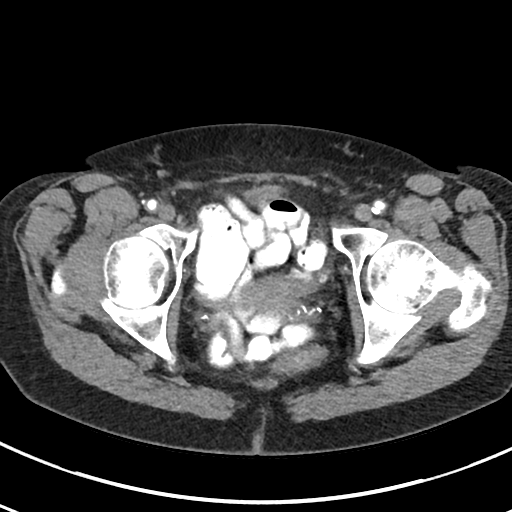
[im 26/92  soft-tissue]
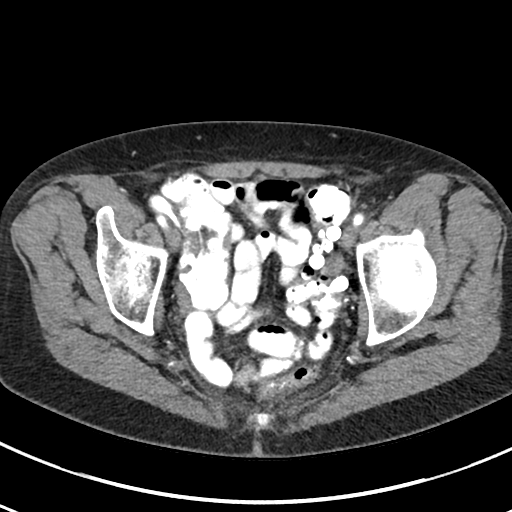
[im 36/92  soft-tissue]
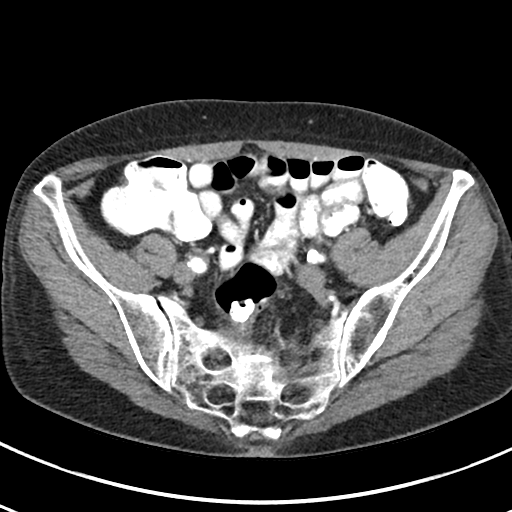
[im 41/92  soft-tissue]
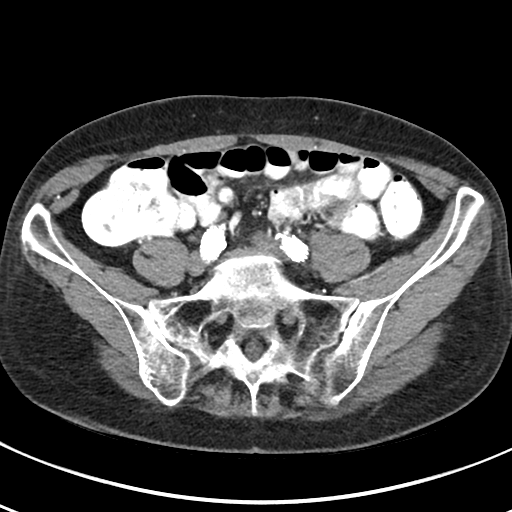
[im 51/92  soft-tissue]
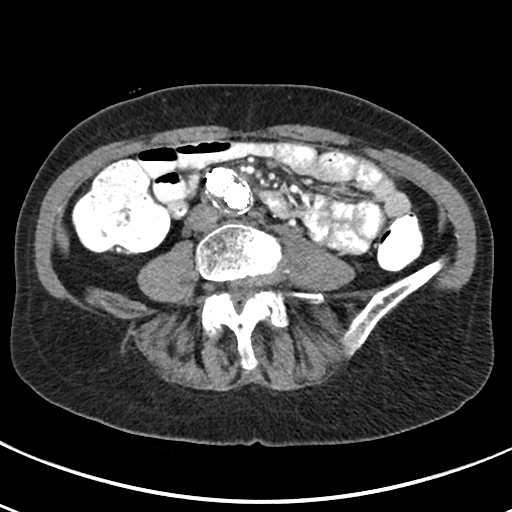
[im 56/92  soft-tissue]
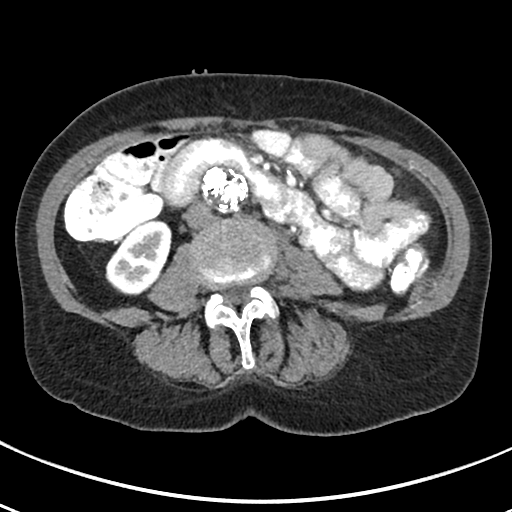
[im 66/92  soft-tissue]
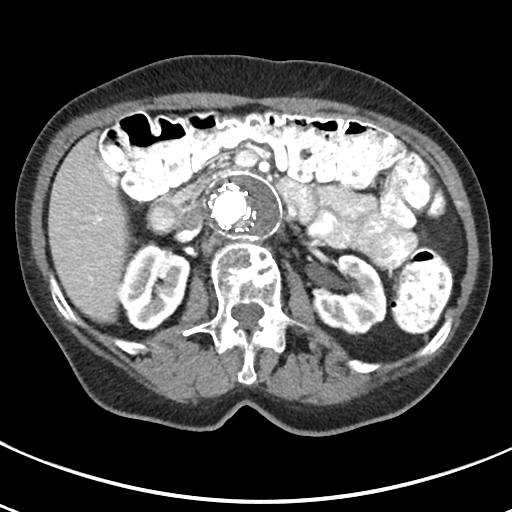
[im 66/92  bone]
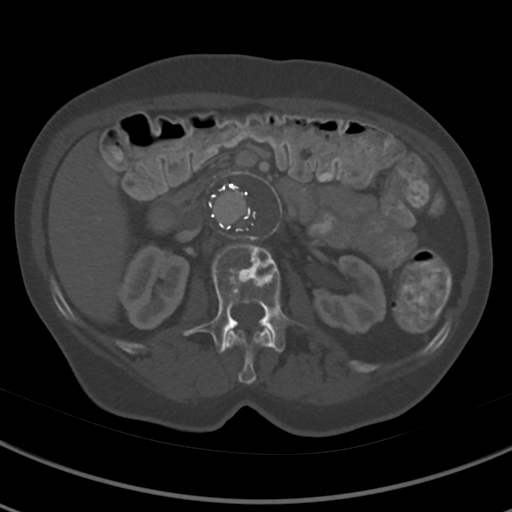
[im 71/92  soft-tissue]
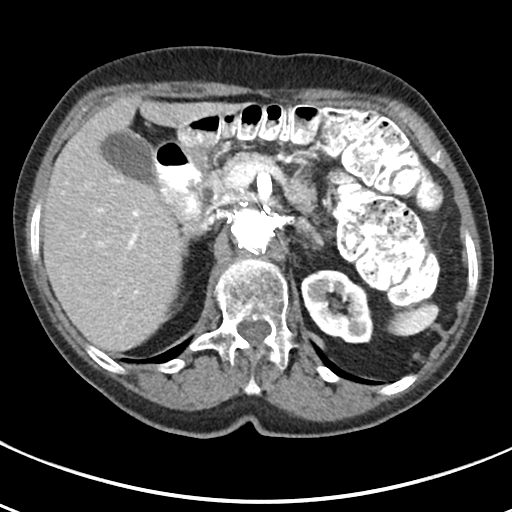
[im 76/92  soft-tissue]
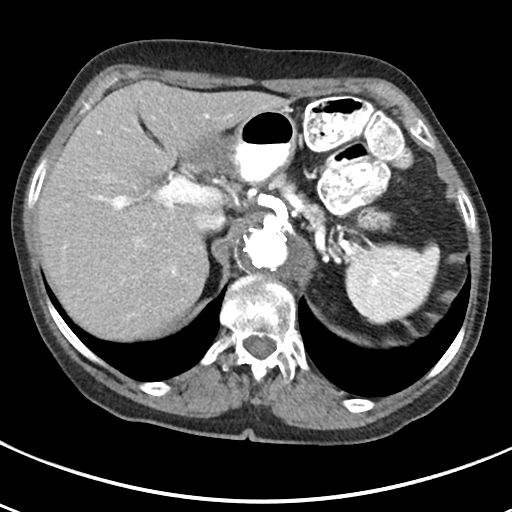
[im 86/92  soft-tissue]
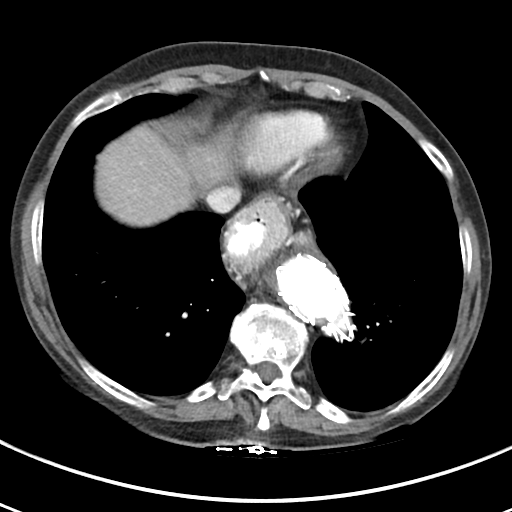

[Series 4: coronals abd pelvis (person_name) 2.00 cor · coronal · 0.61mm/px · 3 of 130 slices shown]
[im 44/130  soft-tissue]
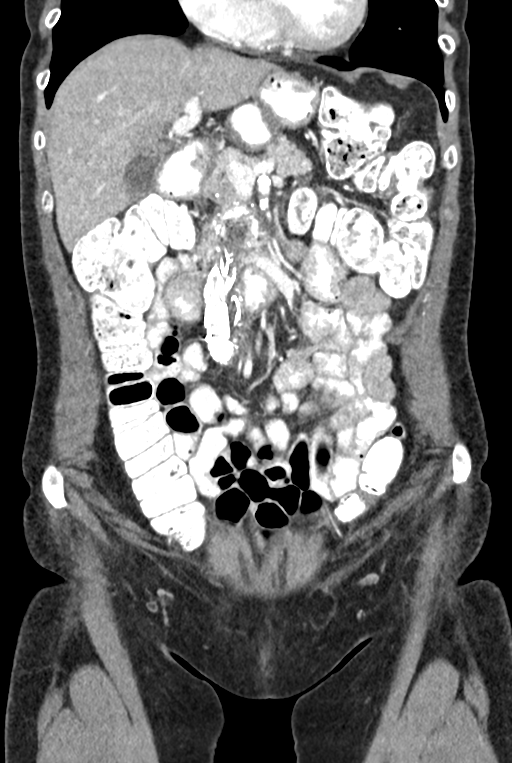
[im 58/130  soft-tissue]
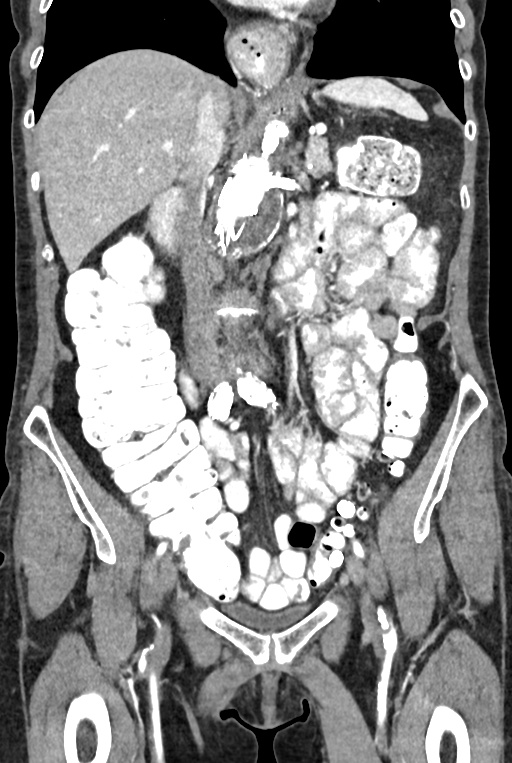
[im 72/130  soft-tissue]
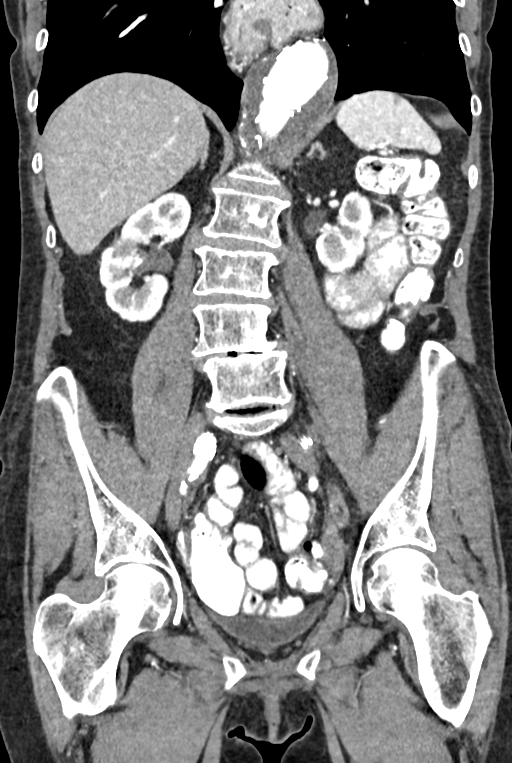

[15 of 46 positions shown; findings below may reference images not displayed]

RADIATION DOSE REDUCTION: This exam was performed according to the
departmental dose-optimization program which includes automated
exposure control, adjustment of the mA and/or kV according to
patient size and/or use of iterative reconstruction technique.

CONTRAST:  85mL OMNIPAQUE IOHEXOL 300 MG/ML  SOLN
FINDINGS: Lower chest: The visualized lung bases are clear. There is coronary
vascular calcification.

No intra-abdominal free air or free fluid.

Hepatobiliary: No focal liver abnormality is seen. No gallstones,
gallbladder wall thickening, or biliary dilatation.

Pancreas: Unremarkable. No pancreatic ductal dilatation or
surrounding inflammatory changes.

Spleen: Normal in size without focal abnormality.

Adrenals/Urinary Tract: The adrenal glands are unremarkable. There
is no hydronephrosis on either side. There is symmetric enhancement
and excretion of contrast by both kidneys. The visualized ureters
and urinary bladder appear unremarkable.

Stomach/Bowel: There is a moderate size hiatal hernia. There is
sigmoid diverticulosis without active inflammatory changes. There is
no bowel obstruction or active inflammation. The appendix is normal.

Vascular/Lymphatic: Status post endovascular stent graft repair of
the visualized thoracic and abdominal aortic aneurysm. The stent
graft appears patent. The descending thoracic aortic lumen measures
5.4 cm in diameter. Infrarenal abdominal aortic aneurysm measures
4.8 cm in diameter. No definite contrast identified outside of the
confines of the stent graft. Evaluation however is limited as
precontrast images are not provided. The IVC is unremarkable. No
portal venous gas. There is no adenopathy.

Reproductive: The uterus is anteverted and grossly unremarkable. No
adnexal masses.

Other: None

Musculoskeletal: Osteopenia with degenerative changes of the spine.
No acute osseous pathology.
IMPRESSION: 1. Moderate size hiatal hernia.
2. Sigmoid diverticulosis. No bowel obstruction. Normal appendix.
3. Status post endovascular stent graft repair of the visualized
thoracic and abdominal aortic aneurysm. The stent graft appears
patent.

## 2022-06-25 ENCOUNTER — Other Ambulatory Visit: Payer: Self-pay | Admitting: *Deleted

## 2022-06-25 DIAGNOSIS — Z122 Encounter for screening for malignant neoplasm of respiratory organs: Secondary | ICD-10-CM

## 2022-06-25 DIAGNOSIS — Z87891 Personal history of nicotine dependence: Secondary | ICD-10-CM

## 2022-06-25 DIAGNOSIS — F17210 Nicotine dependence, cigarettes, uncomplicated: Secondary | ICD-10-CM

## 2022-06-26 LAB — MICROSCOPIC EXAMINATION: Casts: NONE SEEN /lpf

## 2022-06-26 LAB — URINE CULTURE, REFLEX

## 2022-06-26 LAB — UA/M W/RFLX CULTURE, ROUTINE
Bilirubin, UA: NEGATIVE
Glucose, UA: NEGATIVE
Ketones, UA: NEGATIVE
Nitrite, UA: NEGATIVE
Protein,UA: NEGATIVE
RBC, UA: NEGATIVE
Specific Gravity, UA: 1.02 (ref 1.005–1.030)
Urobilinogen, Ur: 0.2 mg/dL (ref 0.2–1.0)
pH, UA: 5.5 (ref 5.0–7.5)

## 2022-06-28 ENCOUNTER — Other Ambulatory Visit: Payer: Self-pay | Admitting: Physician Assistant

## 2022-06-28 ENCOUNTER — Other Ambulatory Visit: Payer: Self-pay | Admitting: Nurse Practitioner

## 2022-06-28 DIAGNOSIS — E039 Hypothyroidism, unspecified: Secondary | ICD-10-CM

## 2022-06-28 DIAGNOSIS — I10 Essential (primary) hypertension: Secondary | ICD-10-CM

## 2022-06-29 ENCOUNTER — Telehealth: Payer: Self-pay | Admitting: Physician Assistant

## 2022-06-29 NOTE — Telephone Encounter (Signed)
Nephrology referral sent via [facility] to [facility]

## 2022-07-02 DIAGNOSIS — R5383 Other fatigue: Secondary | ICD-10-CM | POA: Diagnosis not present

## 2022-07-02 DIAGNOSIS — E785 Hyperlipidemia, unspecified: Secondary | ICD-10-CM | POA: Diagnosis not present

## 2022-07-02 DIAGNOSIS — E039 Hypothyroidism, unspecified: Secondary | ICD-10-CM | POA: Diagnosis not present

## 2022-07-02 DIAGNOSIS — R634 Abnormal weight loss: Secondary | ICD-10-CM | POA: Diagnosis not present

## 2022-07-03 LAB — LIPID PANEL WITH LDL/HDL RATIO
Cholesterol, Total: 178 mg/dL (ref 100–199)
HDL: 58 mg/dL (ref >39)
LDL Chol Calc (NIH): 108 mg/dL — ABNORMAL HIGH (ref 0–99)
LDL/HDL Ratio: 1.9 ratio (ref 0.0–3.2)
Triglycerides: 64 mg/dL (ref 0–149)
VLDL Cholesterol Cal: 12 mg/dL (ref 5–40)

## 2022-07-03 LAB — CBC WITH DIFFERENTIAL/PLATELET
Basophils Absolute: 0.1 10*3/uL (ref 0.0–0.2)
Basos: 2 %
EOS (ABSOLUTE): 0.1 10*3/uL (ref 0.0–0.4)
Eos: 1 %
Hematocrit: 41.3 % (ref 34.0–46.6)
Hemoglobin: 13.7 g/dL (ref 11.1–15.9)
Immature Grans (Abs): 0 10*3/uL (ref 0.0–0.1)
Immature Granulocytes: 0 %
Lymphocytes Absolute: 1.9 10*3/uL (ref 0.7–3.1)
Lymphs: 29 %
MCH: 32.5 pg (ref 26.6–33.0)
MCHC: 33.2 g/dL (ref 31.5–35.7)
MCV: 98 fL — ABNORMAL HIGH (ref 79–97)
Monocytes Absolute: 0.5 10*3/uL (ref 0.1–0.9)
Monocytes: 8 %
Neutrophils Absolute: 3.9 10*3/uL (ref 1.4–7.0)
Neutrophils: 60 %
Platelets: 185 10*3/uL (ref 150–450)
RBC: 4.21 x10E6/uL (ref 3.77–5.28)
RDW: 12.9 % (ref 11.7–15.4)
WBC: 6.5 10*3/uL (ref 3.4–10.8)

## 2022-07-03 LAB — TSH+FREE T4
Free T4: 1.17 ng/dL (ref 0.82–1.77)
TSH: 1.51 u[IU]/mL (ref 0.450–4.500)

## 2022-07-09 ENCOUNTER — Telehealth: Payer: Self-pay

## 2022-07-09 NOTE — Telephone Encounter (Signed)
Called [facility] for add on cannot add due to 7 days

## 2022-07-10 ENCOUNTER — Telehealth: Payer: Self-pay

## 2022-07-10 NOTE — Telephone Encounter (Signed)
-----   Message from Mylinda Latina, PA-C sent at 07/06/2022 12:24 PM EDT ----- Please see if B12 can be added. Otherwise please let her know that her cholesterol is a little elevated still but looking much better, otherwise her labs looked good.

## 2022-07-10 NOTE — Telephone Encounter (Signed)
LMOM advising pt that her cholesterol was still a little elevated but looks much better.  Advised if any questions to call us back

## 2022-07-19 ENCOUNTER — Telehealth: Payer: Self-pay | Admitting: Physician Assistant

## 2022-07-19 NOTE — Telephone Encounter (Signed)
Patient called regarding Nephrology referral. I explained I had sent it 06/29/22. Gave her [facility] telephone #-[facility]

## 2022-07-20 ENCOUNTER — Ambulatory Visit (INDEPENDENT_AMBULATORY_CARE_PROVIDER_SITE_OTHER): Payer: Medicare HMO | Admitting: Pulmonary Disease

## 2022-07-20 ENCOUNTER — Encounter: Payer: Self-pay | Admitting: Pulmonary Disease

## 2022-07-20 DIAGNOSIS — Z87891 Personal history of nicotine dependence: Secondary | ICD-10-CM | POA: Diagnosis not present

## 2022-07-20 DIAGNOSIS — Z122 Encounter for screening for malignant neoplasm of respiratory organs: Secondary | ICD-10-CM

## 2022-07-20 NOTE — Progress Notes (Signed)
Shared Decision Making Visit Lung Cancer Screening Program [phone])   Eligibility: Age 71 y.o. Pack Years Smoking History Calculation 51 (# packs/per year x # years smoked) Recent History of coughing up blood  no Unexplained weight loss? no ( >Than 15 pounds within the last 6 months )  - Pt has lost 7lbs in last 49month  Prior History Lung / other cancer no (Diagnosis within the last 5 years already requiring surveillance chest CT Scans). Smoking Status Current Smoker  Visit Components: Discussion included one or more decision making aids. yes Discussion included risk/benefits of screening. yes Discussion included potential follow up diagnostic testing for abnormal scans. yes Discussion included meaning and risk of over diagnosis. yes Discussion included meaning and risk of False Positives. yes Discussion included meaning of total radiation exposure. yes  Counseling Included: Importance of adherence to annual lung cancer LDCT screening. yes Impact of comorbidities on ability to participate in the program. yes Ability and willingness to under diagnostic treatment. yes  Smoking Cessation Counseling: Current Smokers:  Discussed importance of smoking cessation. yes Information about tobacco cessation classes and interventions provided to patient. yes Patient provided with "ticket" for LDCT Scan. yes Asymptomatic Patient yes  Counseling (Intermediate counseling: > three minutes counseling) [id number] about tobacco cessation classes and interventions provided to patient. Yes Patient provided with "ticket" for LDCT Scan. yes Written Order for Lung Cancer Screening with LDCT placed in Epic. Yes (CT Chest Lung Cancer Screening Low Dose W/O CM) [id number]-Screening of respiratory organs Z87.891-Personal history of nicotine dependence   BLauraine Rinne NP

## 2022-07-20 NOTE — Patient Instructions (Signed)
Thank you for participating in the [facility] Lung Cancer Screening Program. It was our pleasure to meet you today. We will call you with the results of your scan within the next few days. Your scan will be assigned a Lung RADS category score by the physicians reading the scans.  This Lung RADS score determines follow up scanning.  See below for description of categories, and follow up screening recommendations. We will be in touch to schedule your follow up screening annually or based on recommendations of our providers. We will fax a copy of your scan results to your Primary Care Physician, or the physician who referred you to the program, to ensure they have the results. Please call the office if you have any questions or concerns regarding your scanning experience or results.  Our office number is [phone]. Please speak with Denise Phelps, RN. , or  Denise Buckner RN, They are  our Lung Cancer Screening RN.'s If They are unavailable when you call, Please leave a message on the voice mail. We will return your call at our earliest convenience.This voice mail is monitored several times a day.  Remember, if your scan is normal, we will scan you annually as long as you continue to meet the criteria for the program. (Age 55-77, Current smoker or smoker who has quit within the last 15 years). If you are a smoker, remember, quitting is the single most powerful action that you can take to decrease your risk of lung cancer and other pulmonary, breathing related problems. We know quitting is hard, and we are here to help.  Please let us know if there is anything we can do to help you meet your goal of quitting. If you are a former smoker, congratulations. We are proud of you! Remain smoke free! Remember you can refer friends or family members through the number above.  We will screen them to make sure they meet criteria for the program. Thank you for helping us take better care of you by  participating in Lung Screening.  You can receive free nicotine replacement therapy ( patches, gum or mints) by calling 1-800-QUIT NOW. Please call so we can get you on the path to becoming  a non-smoker. I know it is hard, but you can do this!  Lung RADS Categories:  Lung RADS 1: no nodules or definitely non-concerning nodules.  Recommendation is for a repeat annual scan in 12 months.  Lung RADS 2:  nodules that are non-concerning in appearance and behavior with a very low likelihood of becoming an active cancer. Recommendation is for a repeat annual scan in 12 months.  Lung RADS 3: nodules that are probably non-concerning , includes nodules with a low likelihood of becoming an active cancer.  Recommendation is for a 6-month repeat screening scan. Often noted after an upper respiratory illness. We will be in touch to make sure you have no questions, and to schedule your 6-month scan.  Lung RADS 4 A: nodules with concerning findings, recommendation is most often for a follow up scan in 3 months or additional testing based on our provider's assessment of the scan. We will be in touch to make sure you have no questions and to schedule the recommended 3 month follow up scan.  Lung RADS 4 B:  indicates findings that are concerning. We will be in touch with you to schedule additional diagnostic testing based on our provider's  assessment of the scan.  Other options for assistance in smoking cessation (   As covered by your insurance benefits)  Hypnosis for smoking cessation  [employer] [phone]  Acupuncture for smoking cessation  [facility] [phone]   

## 2022-07-24 ENCOUNTER — Ambulatory Visit
Admission: RE | Admit: 2022-07-24 | Discharge: 2022-07-24 | Disposition: A | Discharge status: Home / Self Care | Payer: Medicare HMO | Source: Ambulatory Visit | Attending: Acute Care | Admitting: Acute Care

## 2022-07-24 DIAGNOSIS — Z87891 Personal history of nicotine dependence: Secondary | ICD-10-CM | POA: Insufficient documentation

## 2022-07-24 DIAGNOSIS — F17210 Nicotine dependence, cigarettes, uncomplicated: Secondary | ICD-10-CM | POA: Insufficient documentation

## 2022-07-24 DIAGNOSIS — Z122 Encounter for screening for malignant neoplasm of respiratory organs: Secondary | ICD-10-CM | POA: Insufficient documentation

## 2022-07-25 ENCOUNTER — Telehealth: Payer: Medicare HMO

## 2022-07-25 ENCOUNTER — Telehealth: Payer: Self-pay | Admitting: Physician Assistant

## 2022-07-25 NOTE — Telephone Encounter (Signed)
Nephrology appointment 09/19/22 with [facility]

## 2022-07-26 ENCOUNTER — Other Ambulatory Visit: Payer: Self-pay | Admitting: Acute Care

## 2022-07-26 DIAGNOSIS — Z87891 Personal history of nicotine dependence: Secondary | ICD-10-CM

## 2022-07-26 DIAGNOSIS — F17210 Nicotine dependence, cigarettes, uncomplicated: Secondary | ICD-10-CM

## 2022-07-26 DIAGNOSIS — Z122 Encounter for screening for malignant neoplasm of respiratory organs: Secondary | ICD-10-CM

## 2022-07-30 ENCOUNTER — Other Ambulatory Visit: Payer: Self-pay | Admitting: Physician Assistant

## 2022-07-30 ENCOUNTER — Encounter: Payer: Self-pay | Admitting: Physician Assistant

## 2022-07-30 ENCOUNTER — Ambulatory Visit (INDEPENDENT_AMBULATORY_CARE_PROVIDER_SITE_OTHER): Payer: Medicare HMO | Admitting: Physician Assistant

## 2022-07-30 VITALS — BP 113/60 | HR 77 | Temp 98.3°F | Resp 16 | Ht 67.0 in | Wt 126.2 lb

## 2022-07-30 DIAGNOSIS — I700 Atherosclerosis of aorta: Secondary | ICD-10-CM

## 2022-07-30 DIAGNOSIS — I10 Essential (primary) hypertension: Secondary | ICD-10-CM

## 2022-07-30 DIAGNOSIS — J439 Emphysema, unspecified: Secondary | ICD-10-CM

## 2022-07-30 DIAGNOSIS — G8929 Other chronic pain: Secondary | ICD-10-CM

## 2022-07-30 NOTE — Progress Notes (Signed)
[facility] [place], [place] [address]  Internal MEDICINE  Office Visit Note  Patient Name: Joy Carpenter  [id number]  [id number]  Date of Service: 08/12/2022  Chief Complaint  Patient presents with   Follow-up    Review CT scan   Depression   Gastroesophageal Reflux   Hypertension   Hyperlipidemia    HPI Pt is here for routine follow up -Has appt with nephrology in dec -CT chest lung cancer screening reviewed: IMPRESSION:  1. Lung-RADS 2, benign appearance or behavior. Continue annual  screening with low-dose chest CT without contrast in 12 months.  2. Coronary artery calcifications.  3. Moderate hiatal hernia.  4. Aortic Atherosclerosis (ICD10-I70.0) and Emphysema (ICD10-J43.9).   -Pt already aware of hernia and debating repair as she is not having any reflux, but does have abdominal/flank pain that may not be attributed to this -Also aware of atherosclerosis and coronary calcifications and is followed by vascular and takes simvastatin  Current Medication: Outpatient Encounter Medications as of 07/30/2022  Medication Sig   ALPRAZolam (XANAX) 0.25 MG tablet TAKE 1 TABLET BY MOUTH EVERY DAY AS NEEDED FOR ANXIETY   amLODipine (NORVASC) 2.5 MG tablet TAKE 1 TABLET (2.5 MG TOTAL) BY MOUTH DAILY. FOR HYPERTENSION   ampicillin (PRINCIPEN) 500 MG capsule Take 1 capsule (500 mg total) by mouth 3 (three) times daily.   aspirin 81 MG chewable tablet Chew by mouth.   Aspirin-Caffeine 845-65 MG PACK Take 1 Dose by mouth as needed.   levothyroxine (SYNTHROID) 50 MCG tablet TAKE 1 TABLET BY MOUTH EVERY MORNING 30 MINUTES PRIOR TO EATING FOOD   pantoprazole (PROTONIX) 40 MG tablet TAKE ONE TABLET BY MOUTH ONCE DAILY   simvastatin (ZOCOR) 20 MG tablet TAKE 1 TABLET BY MOUTH EVERY DAY   zolpidem (AMBIEN) 10 MG tablet TAKE 1 TABLET BY MOUTH EVERYDAY AT BEDTIME   [DISCONTINUED] traMADol (ULTRAM) 50 MG tablet Take 1 tablet (50 mg total) by mouth daily as  needed.   No facility-administered encounter medications on file as of 07/30/2022.    Surgical History: Past Surgical History:  Procedure Laterality Date   COLONOSCOPY     COLONOSCOPY WITH PROPOFOL N/A 06/14/2016   Procedure: COLONOSCOPY WITH PROPOFOL;  Surgeon: Lucilla Lame, MD;  Location: [place];  Service: Endoscopy;  Laterality: N/A;   ESOPHAGOGASTRODUODENOSCOPY (EGD) WITH PROPOFOL N/A 06/14/2016   Procedure: ESOPHAGOGASTRODUODENOSCOPY (EGD) WITH PROPOFOL;  Surgeon: Lucilla Lame, MD;  Location: [place];  Service: Endoscopy;  Laterality: N/A;   ESOPHAGOGASTRODUODENOSCOPY (EGD) WITH PROPOFOL N/A 11/27/2021   Procedure: ESOPHAGOGASTRODUODENOSCOPY (EGD) WITH BIOPSY;  Surgeon: Lucilla Lame, MD;  Location: [place];  Service: Endoscopy;  Laterality: N/A;   HYSTEROSCOPY WITH D & C N/A 07/18/2021   Procedure: DILATATION AND CURETTAGE /HYSTEROSCOPY;  Surgeon: Malachy Mood, MD;  Location: [facility];  Service: Gynecology;  Laterality: N/A;   THORACIC AORTIC ANEURYSM REPAIR N/A 11/16/2020   Procedure: 4V FEVAR (CA, SMA, RRA, LRA) WITH PROXIMAL TEVAR EXTENSION WITH BILATERAL ILIAC LIMB EXTENSIONS; Location: [facility]; Surgeon; Vinnie Level, MD   TUBAL LIGATION      Medical History: Past Medical History:  Diagnosis Date   Actinic keratosis 03/28/2021   right upper arm, bx proven   Anxiety    Carpal tunnel syndrome 2019   Depression    Emphysema of lung ([place])    "mild"   GERD (gastroesophageal reflux disease)    Headache    daily - stress   Herpes    History of  hiatal hernia    Hyperlipidemia    Hypertension    Hypothyroidism    Insomnia    Osteoporosis    SCC (squamous cell carcinoma) 03/28/2021   left dorsal hand, ED[facility]   Thoracic aortic aneurysm (TAA) ([facility])    a.) s/p 4v FEVAR (CA, SMA, RRA, LRA) and proximal TEVAR extention with BILATERAL iliac limb extensions on 11/16/2020   Vertigo    1 episode - approx 2010    Weakness of both legs     intermittent    Family History: Family History  Problem Relation Age of Onset   Leukemia Mother    Aneurysm Other     Social History   Socioeconomic History   Marital status: Divorced    Spouse name: Not on file   Number of children: Not on file   Years of education: Not on file   Highest education level: Not on file  Occupational History   Not on file  Tobacco Use   Smoking status: Every Day    Packs/day: 1.00    Years: 30.00    Total pack years: 30.00    Types: Cigarettes   Smokeless tobacco: Never   Tobacco comments:    1 pack daily  Vaping Use   Vaping Use: Never used  Substance and Sexual Activity   Alcohol use: No   Drug use: Never   Sexual activity: Not Currently    Birth control/protection: Post-menopausal  Other Topics Concern   Not on file  Social History Narrative   Lives in [place] with grand-daughter, currently staying with boyfriend   Social Determinants of Health   Financial Resource Strain: Not on file  Food Insecurity: Not on file  Transportation Needs: Not on file  Physical Activity: Not on file  Stress: Not on file  Social Connections: Not on file  Intimate Partner Violence: Not on file      Review of Systems  Constitutional:  Negative for chills, diaphoresis and fatigue.  HENT:  Negative for ear pain, postnasal drip and sinus pressure.   Eyes:  Negative for photophobia, discharge, redness, itching and visual disturbance.  Respiratory:  Negative for cough, shortness of breath and wheezing.   Cardiovascular:  Negative for chest pain, palpitations and leg swelling.  Gastrointestinal:  Positive for abdominal pain. Negative for constipation, diarrhea, nausea and vomiting.  Genitourinary:  Negative for dysuria and flank pain.  Musculoskeletal:  Negative for arthralgias, back pain, gait problem and neck pain.  Skin:  Negative for color change.  Allergic/Immunologic: Negative for environmental allergies and food allergies.  Neurological:   Negative for dizziness and headaches.  Hematological:  Does not bruise/bleed easily.  Psychiatric/Behavioral:  Negative for agitation, behavioral problems (depression) and hallucinations.     Vital Signs: BP 113/60   Pulse 77   Temp 98.3 F (36.8 C)   Resp 16   Ht '5\' 7"'$  (1.702 m)   Wt 126 lb 3.2 oz (57.2 kg)   SpO2 99%   BMI 19.77 kg/m    Physical Exam Vitals and nursing note reviewed.  Constitutional:      General: She is not in acute distress.    Appearance: She is well-developed. She is not diaphoretic.  HENT:     Head: Normocephalic and atraumatic.     Mouth/Throat:     Pharynx: No oropharyngeal exudate.  Eyes:     Pupils: Pupils are equal, round, and reactive to light.  Neck:     Thyroid: No thyromegaly.     Vascular:  No JVD.     Trachea: No tracheal deviation.  Cardiovascular:     Rate and Rhythm: Normal rate and regular rhythm.     Heart sounds: Normal heart sounds. No murmur heard.    No friction rub. No gallop.  Pulmonary:     Effort: Pulmonary effort is normal. No respiratory distress.     Breath sounds: No wheezing or rales.  Chest:     Chest wall: No tenderness.  Abdominal:     General: Bowel sounds are normal.     Palpations: Abdomen is soft.  Musculoskeletal:        General: Normal range of motion.     Cervical back: Normal range of motion and neck supple.  Lymphadenopathy:     Cervical: No cervical adenopathy.  Skin:    General: Skin is warm and dry.  Neurological:     Mental Status: She is alert and oriented to person, place, and time.     Cranial Nerves: No cranial nerve deficit.  Psychiatric:        Behavior: Behavior normal.        Thought Content: Thought content normal.        Judgment: Judgment normal.        Assessment/Plan: 1. Essential hypertension Stable, continue current medications  2. Pulmonary emphysema, unspecified emphysema type ([place]) Seen on CT, denies any problems with breathing and will continue to monitor  3.  Aortic atherosclerosis ([place]) Continue simvastatin   General Counseling: sruti ayllon understanding of the findings of todays visit and agrees with plan of treatment. I have discussed any further diagnostic evaluation that may be needed or ordered today. We also reviewed her medications today. she has been encouraged to call the office with any questions or concerns that should arise related to todays visit.    No orders of the defined types were placed in this encounter.   No orders of the defined types were placed in this encounter.   This patient was seen by Drema Dallas, PA-C in collaboration with Dr. Clayborn Bigness as a part of collaborative care agreement.   Total time spent:30 Minutes Time spent includes review of chart, medications, test results, and follow up plan with the patient.      Dr Lavera Guise Internal medicine

## 2022-08-06 ENCOUNTER — Encounter (INDEPENDENT_AMBULATORY_CARE_PROVIDER_SITE_OTHER): Payer: Self-pay

## 2022-08-19 ENCOUNTER — Other Ambulatory Visit: Payer: Self-pay | Admitting: Physician Assistant

## 2022-08-19 DIAGNOSIS — F411 Generalized anxiety disorder: Secondary | ICD-10-CM

## 2022-08-21 NOTE — Telephone Encounter (Signed)
Last 07/30/22 and next 1/24

## 2022-09-19 DIAGNOSIS — R8290 Unspecified abnormal findings in urine: Secondary | ICD-10-CM | POA: Diagnosis not present

## 2022-09-19 DIAGNOSIS — N182 Chronic kidney disease, stage 2 (mild): Secondary | ICD-10-CM | POA: Diagnosis not present

## 2022-09-19 DIAGNOSIS — I129 Hypertensive chronic kidney disease with stage 1 through stage 4 chronic kidney disease, or unspecified chronic kidney disease: Secondary | ICD-10-CM | POA: Diagnosis not present

## 2022-09-19 DIAGNOSIS — R809 Proteinuria, unspecified: Secondary | ICD-10-CM | POA: Diagnosis not present

## 2022-09-20 DIAGNOSIS — N182 Chronic kidney disease, stage 2 (mild): Secondary | ICD-10-CM | POA: Insufficient documentation

## 2022-10-15 ENCOUNTER — Other Ambulatory Visit: Payer: Self-pay | Admitting: Physician Assistant

## 2022-10-15 ENCOUNTER — Telehealth: Payer: Self-pay

## 2022-10-15 DIAGNOSIS — F411 Generalized anxiety disorder: Secondary | ICD-10-CM

## 2022-10-15 MED ORDER — ALPRAZOLAM 0.25 MG PO TABS: ORAL_TABLET | ORAL | 0 refills | Status: DC

## 2022-10-15 NOTE — Telephone Encounter (Signed)
done

## 2022-10-22 ENCOUNTER — Encounter: Payer: Self-pay | Admitting: Physician Assistant

## 2022-10-22 ENCOUNTER — Ambulatory Visit (INDEPENDENT_AMBULATORY_CARE_PROVIDER_SITE_OTHER): Payer: Medicare HMO | Admitting: Physician Assistant

## 2022-10-22 VITALS — BP 130/80 | HR 82 | Temp 98.0°F | Resp 16 | Ht 67.0 in | Wt 130.0 lb

## 2022-10-22 DIAGNOSIS — I10 Essential (primary) hypertension: Secondary | ICD-10-CM | POA: Diagnosis not present

## 2022-10-22 DIAGNOSIS — R079 Chest pain, unspecified: Secondary | ICD-10-CM | POA: Diagnosis not present

## 2022-10-22 DIAGNOSIS — K449 Diaphragmatic hernia without obstruction or gangrene: Secondary | ICD-10-CM | POA: Diagnosis not present

## 2022-10-22 DIAGNOSIS — G8929 Other chronic pain: Secondary | ICD-10-CM | POA: Diagnosis not present

## 2022-10-22 DIAGNOSIS — R1032 Left lower quadrant pain: Secondary | ICD-10-CM | POA: Diagnosis not present

## 2022-10-22 DIAGNOSIS — F411 Generalized anxiety disorder: Secondary | ICD-10-CM | POA: Diagnosis not present

## 2022-10-22 MED ORDER — ALPRAZOLAM 0.25 MG PO TABS: ORAL_TABLET | ORAL | 1 refills | Status: DC

## 2022-10-22 MED ORDER — TETANUS-DIPHTH-ACELL PERTUSSIS 5-2.5-18.5 LF-MCG/0.5 IM SUSP: 0.5000 mL | Freq: Once | INTRAMUSCULAR | 0 refills | Status: AC

## 2022-10-22 MED ORDER — TRAMADOL HCL 50 MG PO TABS: 50.0000 mg | ORAL_TABLET | Freq: Every day | ORAL | 1 refills | Status: DC | PRN

## 2022-10-22 NOTE — Progress Notes (Signed)
[facility] [place], [place] [address]  Internal MEDICINE  Office Visit Note  Patient Name: Joy Carpenter  [id number]  [id number]  Date of Service: 10/22/2022  Chief Complaint  Patient presents with   Follow-up   Depression   Gastroesophageal Reflux   Hypertension   Hyperlipidemia    HPI Pt is here for routine follow up -Did see nephrology and they started her on losartan '25mg'$  and has her increasing water intake. Also told to start taking colace -has some occasional chest pain, lasts a few days sometimes. Already has chronic left side abdominal/flank pain that worsens sometimes and radiates up into chest from flank, but not always.  -No SOB, heart racing, sweating or anxiety when it comes on. Doesn't matter what she is doing. Not reproducible on palpation or with movement. Will be present at rest. Not having any CP today. Has had this intermittently for years, but seems to last longer now.  -doesn't feel like heart burn and is taking her protonix -never rescheduled cardiology eval for preop visit before hernia surgery could be done last year.  -does sometimes feel things stick in throat swallowing as well and again may be due to hernia. -Will refer to cardiology for eval now and patient will reschedule appt with general surgery to reconsider hernia repair due to progression of symptoms now that could be related. -Discussed any worsening CP or progression or symptoms to go to ED  Current Medication: Outpatient Encounter Medications as of 10/22/2022  Medication Sig   amLODipine (NORVASC) 2.5 MG tablet TAKE 1 TABLET (2.5 MG TOTAL) BY MOUTH DAILY. FOR HYPERTENSION   aspirin 81 MG chewable tablet Chew by mouth.   Aspirin-Caffeine 845-65 MG PACK Take 1 Dose by mouth as needed.   levothyroxine (SYNTHROID) 50 MCG tablet TAKE 1 TABLET BY MOUTH EVERY MORNING 30 MINUTES PRIOR TO EATING FOOD   losartan (COZAAR) 25 MG tablet Take 25 mg by mouth daily.    pantoprazole (PROTONIX) 40 MG tablet TAKE ONE TABLET BY MOUTH ONCE DAILY   simvastatin (ZOCOR) 20 MG tablet TAKE 1 TABLET BY MOUTH EVERY DAY   zolpidem (AMBIEN) 10 MG tablet TAKE 1 TABLET BY MOUTH EVERYDAY AT BEDTIME   [DISCONTINUED] ALPRAZolam (XANAX) 0.25 MG tablet Take 1 tablet by mouth as needed for anxiety   [DISCONTINUED] ampicillin (PRINCIPEN) 500 MG capsule Take 1 capsule (500 mg total) by mouth 3 (three) times daily.   [DISCONTINUED] Tdap (BOOSTRIX) 5-2.5-18.5 LF-MCG/0.5 injection Inject 0.5 mLs into the muscle once.   [DISCONTINUED] traMADol (ULTRAM) 50 MG tablet TAKE 1 TABLET BY MOUTH DAILY AS NEEDED.   [START ON 11/15/2022] ALPRAZolam (XANAX) 0.25 MG tablet Take 1 tablet by mouth as needed for anxiety   Tdap (BOOSTRIX) 5-2.5-18.5 LF-MCG/0.5 injection Inject 0.5 mLs into the muscle once for 1 dose.   traMADol (ULTRAM) 50 MG tablet Take 1 tablet (50 mg total) by mouth daily as needed.   No facility-administered encounter medications on file as of 10/22/2022.    Surgical History: Past Surgical History:  Procedure Laterality Date   COLONOSCOPY     COLONOSCOPY WITH PROPOFOL N/A 06/14/2016   Procedure: COLONOSCOPY WITH PROPOFOL;  Surgeon: Lucilla Lame, MD;  Location: [place];  Service: Endoscopy;  Laterality: N/A;   ESOPHAGOGASTRODUODENOSCOPY (EGD) WITH PROPOFOL N/A 06/14/2016   Procedure: ESOPHAGOGASTRODUODENOSCOPY (EGD) WITH PROPOFOL;  Surgeon: Lucilla Lame, MD;  Location: [place];  Service: Endoscopy;  Laterality: N/A;   ESOPHAGOGASTRODUODENOSCOPY (EGD) WITH PROPOFOL N/A 11/27/2021  Procedure: ESOPHAGOGASTRODUODENOSCOPY (EGD) WITH BIOPSY;  Surgeon: Lucilla Lame, MD;  Location: [place];  Service: Endoscopy;  Laterality: N/A;   HYSTEROSCOPY WITH D & C N/A 07/18/2021   Procedure: DILATATION AND CURETTAGE /HYSTEROSCOPY;  Surgeon: Malachy Mood, MD;  Location: [facility];  Service: Gynecology;  Laterality: N/A;   THORACIC AORTIC ANEURYSM REPAIR N/A  11/16/2020   Procedure: 4V FEVAR (CA, SMA, RRA, LRA) WITH PROXIMAL TEVAR EXTENSION WITH BILATERAL ILIAC LIMB EXTENSIONS; Location: [facility]; Surgeon; Vinnie Level, MD   TUBAL LIGATION      Medical History: Past Medical History:  Diagnosis Date   Actinic keratosis 03/28/2021   right upper arm, bx proven   Anxiety    Carpal tunnel syndrome 2019   Depression    Emphysema of lung ([place])    "mild"   GERD (gastroesophageal reflux disease)    Headache    daily - stress   Herpes    History of hiatal hernia    Hyperlipidemia    Hypertension    Hypothyroidism    Insomnia    Osteoporosis    SCC (squamous cell carcinoma) 03/28/2021   left dorsal hand, EDC   Thoracic aortic aneurysm (TAA) ([facility])    a.) s/p 4v FEVAR (CA, SMA, RRA, LRA) and proximal TEVAR extention with BILATERAL iliac limb extensions on 11/16/2020   Vertigo    1 episode - approx 2010    Weakness of both legs    intermittent    Family History: Family History  Problem Relation Age of Onset   Leukemia Mother    Aneurysm Other     Social History   Socioeconomic History   Marital status: Divorced    Spouse name: Not on file   Number of children: Not on file   Years of education: Not on file   Highest education level: Not on file  Occupational History   Not on file  Tobacco Use   Smoking status: Every Day    Packs/day: 1.00    Years: 30.00    Total pack years: 30.00    Types: Cigarettes   Smokeless tobacco: Never   Tobacco comments:    1 pack daily  Vaping Use   Vaping Use: Never used  Substance and Sexual Activity   Alcohol use: No   Drug use: Never   Sexual activity: Not Currently    Birth control/protection: Post-menopausal  Other Topics Concern   Not on file  Social History Narrative   Lives in [place] with grand-daughter, currently staying with boyfriend   Social Determinants of Health   Financial Resource Strain: Not on file  Food Insecurity: Not on file  Transportation Needs: Not on file   Physical Activity: Not on file  Stress: Not on file  Social Connections: Not on file  Intimate Partner Violence: Not on file      Review of Systems  Constitutional:  Negative for chills, diaphoresis and fatigue.  HENT:  Positive for trouble swallowing. Negative for ear pain, postnasal drip and sinus pressure.   Eyes:  Negative for photophobia, discharge, redness, itching and visual disturbance.  Respiratory:  Negative for cough, shortness of breath and wheezing.   Cardiovascular:  Positive for chest pain. Negative for palpitations and leg swelling.  Gastrointestinal:  Positive for abdominal pain. Negative for constipation, diarrhea, nausea and vomiting.  Genitourinary:  Negative for dysuria and flank pain.  Musculoskeletal:  Negative for arthralgias, back pain, gait problem and neck pain.  Skin:  Negative for color change.  Allergic/Immunologic: Negative  for environmental allergies and food allergies.  Neurological:  Negative for dizziness and headaches.  Hematological:  Does not bruise/bleed easily.  Psychiatric/Behavioral:  Negative for agitation, behavioral problems (depression) and hallucinations.     Vital Signs: BP 130/80   Pulse 82   Temp 98 F (36.7 C)   Resp 16   Ht '5\' 7"'$  (1.702 m)   Wt 130 lb (59 kg)   SpO2 99%   BMI 20.36 kg/m    Physical Exam Vitals and nursing note reviewed.  Constitutional:      General: She is not in acute distress.    Appearance: She is well-developed. She is not diaphoretic.  HENT:     Head: Normocephalic and atraumatic.     Mouth/Throat:     Pharynx: No oropharyngeal exudate.  Eyes:     Pupils: Pupils are equal, round, and reactive to light.  Neck:     Thyroid: No thyromegaly.     Vascular: No JVD.     Trachea: No tracheal deviation.  Cardiovascular:     Rate and Rhythm: Normal rate and regular rhythm.     Heart sounds: Normal heart sounds. No murmur heard.    No friction rub. No gallop.  Pulmonary:     Effort: Pulmonary  effort is normal. No respiratory distress.     Breath sounds: No wheezing or rales.  Chest:     Chest wall: No tenderness.  Abdominal:     General: Bowel sounds are normal.     Palpations: Abdomen is soft.  Musculoskeletal:        General: Normal range of motion.     Cervical back: Normal range of motion and neck supple.  Lymphadenopathy:     Cervical: No cervical adenopathy.  Skin:    General: Skin is warm and dry.  Neurological:     Mental Status: She is alert and oriented to person, place, and time.     Cranial Nerves: No cranial nerve deficit.  Psychiatric:        Behavior: Behavior normal.        Thought Content: Thought content normal.        Judgment: Judgment normal.        Assessment/Plan: 1. Intermittent chest pain Will refer to cardiology for further evaluation given not present in office today and very intermittent. Discussed it is possible to be coming from hernia, but could have many other etiologies that warrant investigation. Advised to go to ED for new or worsening symptoms. - Ambulatory referral to Cardiology  2. Chronic LLQ pain - traMADol (ULTRAM) 50 MG tablet; Take 1 tablet (50 mg total) by mouth daily as needed.  Dispense: 30 tablet; Refill: 1  3. Generalized anxiety disorder - ALPRAZolam (XANAX) 0.25 MG tablet; Take 1 tablet by mouth as needed for anxiety  Dispense: 30 tablet; Refill: 1  4. Essential hypertension Stable, continue current medications  5. Hernia, hiatal Will follow up with GS and consider rescheduling repair   General Counseling: jandi swiger understanding of the findings of todays visit and agrees with plan of treatment. I have discussed any further diagnostic evaluation that may be needed or ordered today. We also reviewed her medications today. she has been encouraged to call the office with any questions or concerns that should arise related to todays visit.    Orders Placed This Encounter  Procedures   Ambulatory  referral to Cardiology    Meds ordered this encounter  Medications   Tdap (BOOSTRIX) 5-2.5-18.5 LF-MCG/0.5 injection  Sig: Inject 0.5 mLs into the muscle once for 1 dose.    Dispense:  0.5 mL    Refill:  0   traMADol (ULTRAM) 50 MG tablet    Sig: Take 1 tablet (50 mg total) by mouth daily as needed.    Dispense:  30 tablet    Refill:  1    Not to exceed 5 additional fills before 12/18/2022   ALPRAZolam (XANAX) 0.25 MG tablet    Sig: Take 1 tablet by mouth as needed for anxiety    Dispense:  30 tablet    Refill:  1    Not to exceed 2 additional fills before 12/19/2022    This patient was seen by Drema Dallas, PA-C in collaboration with Dr. Clayborn Bigness as a part of collaborative care agreement.   Total time spent:30 Minutes Time spent includes review of chart, medications, test results, and follow up plan with the patient.      Dr Lavera Guise Internal medicine

## 2022-11-28 ENCOUNTER — Ambulatory Visit: Payer: Medicare HMO | Attending: Cardiology | Admitting: Cardiology

## 2022-11-28 ENCOUNTER — Encounter: Payer: Self-pay | Admitting: Cardiology

## 2022-11-28 VITALS — BP 118/70 | HR 75 | Ht 66.0 in | Wt 127.4 lb

## 2022-11-28 DIAGNOSIS — I2510 Atherosclerotic heart disease of native coronary artery without angina pectoris: Secondary | ICD-10-CM | POA: Diagnosis not present

## 2022-11-28 DIAGNOSIS — F17200 Nicotine dependence, unspecified, uncomplicated: Secondary | ICD-10-CM

## 2022-11-28 DIAGNOSIS — I10 Essential (primary) hypertension: Secondary | ICD-10-CM | POA: Diagnosis not present

## 2022-11-28 DIAGNOSIS — R079 Chest pain, unspecified: Secondary | ICD-10-CM | POA: Diagnosis not present

## 2022-11-28 DIAGNOSIS — R072 Precordial pain: Secondary | ICD-10-CM

## 2022-11-28 DIAGNOSIS — E7800 Pure hypercholesterolemia, unspecified: Secondary | ICD-10-CM | POA: Diagnosis not present

## 2022-11-28 MED ORDER — ATORVASTATIN CALCIUM 40 MG PO TABS: 40.0000 mg | ORAL_TABLET | Freq: Every day | ORAL | 3 refills | Status: DC

## 2022-11-28 MED ORDER — ATORVASTATIN CALCIUM 40 MG PO TABS: 40.0000 mg | ORAL_TABLET | Freq: Every day | ORAL | 2 refills | Status: DC

## 2022-11-28 NOTE — Patient Instructions (Signed)
Medication Instructions:   STOP Simvastatin 2. START Atorvastatin - take one tablet ( 40 mg) by mouth daily.   *If you need a refill on your cardiac medications before your next appointment, please call your pharmacy*   Lab Work:  None Ordered  If you have labs (blood work) drawn today and your tests are completely normal, you will receive your results only by: [place] (if you have MyChart) OR A paper copy in the mail If you have any lab test that is abnormal or we need to change your treatment, we will call you to review the results.   Testing/Procedures:  Your physician has requested that you have an echocardiogram. Echocardiography is a painless test that uses sound waves to create images of your heart. It provides your doctor with information about the size and shape of your heart and how well your heart's chambers and valves are working. This procedure takes approximately one hour. There are no restrictions for this procedure. Please do NOT wear cologne, perfume, aftershave, or lotions (deodorant is allowed). Please arrive 15 minutes prior to your appointment time.  [place]  Your caregiver has ordered a Stress Test with nuclear imaging. The purpose of this test is to evaluate the blood supply to your heart muscle. This procedure is referred to as a "Non-Invasive Stress Test." This is because other than having an IV started in your vein, nothing is inserted or "invades" your body. Cardiac stress tests are done to find areas of poor blood flow to the heart by determining the extent of coronary artery disease (CAD). Some patients exercise on a treadmill, which naturally increases the blood flow to your heart, while others who are  unable to walk on a treadmill due to physical limitations have a pharmacologic/chemical stress agent called Lexiscan . This medicine will mimic walking on a treadmill by temporarily increasing your coronary blood flow.   Please note: these test  may take anywhere between 2-4 hours to complete  PLEASE REPORT TO [place] AT THE FIRST DESK WILL DIRECT YOU WHERE TO GO  Date of Procedure:_____________________________________  Arrival Time for Procedure:______________________________   PLEASE NOTIFY THE OFFICE AT LEAST 24 HOURS IN ADVANCE IF YOU ARE UNABLE TO KEEP YOUR APPOINTMENT.  [phone] AND  PLEASE NOTIFY NUCLEAR MEDICINE AT [facility] AT LEAST 24 HOURS IN ADVANCE IF YOU ARE UNABLE TO KEEP YOUR APPOINTMENT. [phone]  How to prepare for your Myoview test:  Do not eat or drink after midnight No caffeine for 24 hours prior to test No smoking 24 hours prior to test. Your medication may be taken with water.  If your doctor stopped a medication because of this test, do not take that medication. Ladies, please do not wear dresses.  Skirts or pants are appropriate. Please wear a short sleeve shirt. No perfume, cologne or lotion. Wear comfortable walking shoes. No heels!    Follow-Up: At [facility], you and your health needs are our priority.  As part of our continuing mission to provide you with exceptional heart care, we have created designated Provider Care Teams.  These Care Teams include your primary Cardiologist (physician) and Advanced Practice Providers (APPs -  Physician Assistants and Nurse Practitioners) who all work together to provide you with the care you need, when you need it.  We recommend signing up for the patient portal called "MyChart".  Sign up information is provided on this After Visit Summary.  MyChart is used to connect  with patients for Virtual Visits (Telemedicine).  Patients are able to view lab/test results, encounter notes, upcoming appointments, etc.  Non-urgent messages can be sent to your provider as well.   To learn more about what you can do with MyChart, go to [email or web address].    Your next appointment:    After testing  Provider:   You may  see Kate Sable, MD or one of the following Advanced Practice Providers on your designated Care Team:   Murray Hodgkins, NP Christell Faith, PA-C Cadence Kathlen Mody, PA-C Gerrie Nordmann, NP

## 2022-11-28 NOTE — Progress Notes (Signed)
Cardiology Office Note:    Date:  11/28/2022   ID:  Joy, Carpenter 1951/03/30, MRN [id number]  PCP:  McDonough, Lauren K, [place] [facility] Cardiologist:  Kate Sable, MD     Referring MD: Mylinda Latina, PA*   Chief Complaint  Patient presents with   New Patient (Initial Visit)    Chest Pain, AAA Hx, occasional L arm pain    History of Present Illness:    Joy Carpenter is a 72 y.o. female with a hx of CAD (coronary calcifications on chest CT ), hypertension, hyperlipidemia, AAA s/p endograft 2/22 current smoker x 40+ years who presents due to chest pain.  Symptoms of chest pain have been ongoing for about a year now, seems to have worsened of late.  Symptoms are not associated with exertion.  She sometimes have pain radiating down her left arm.  Has aortic aneurysm, s/p endograft repair at [facility].  Follows up with [facility] yearly for monitoring.  Boyfriend recently diagnosed with stage IV lung cancer, daughter recently had a stroke.  Patient feels stressed from both situations.  CT chest lung cancer screening 10/23 showed extensive LAD and RCA calcifications.  Past Medical History:  Diagnosis Date   Actinic keratosis 03/28/2021   right upper arm, bx proven   Anxiety    Carpal tunnel syndrome 2019   Depression    Emphysema of lung ([place])    "mild"   GERD (gastroesophageal reflux disease)    Headache    daily - stress   Herpes    History of hiatal hernia    Hyperlipidemia    Hypertension    Hypothyroidism    Insomnia    Osteoporosis    SCC (squamous cell carcinoma) 03/28/2021   left dorsal hand, EDC   Thoracic aortic aneurysm (TAA) ([place])    a.) s/p 4v FEVAR (CA, SMA, RRA, LRA) and proximal TEVAR extention with BILATERAL iliac limb extensions on 11/16/2020   Vertigo    1 episode - approx 2010    Weakness of both legs    intermittent    Past Surgical History:  Procedure Laterality Date   COLONOSCOPY     COLONOSCOPY  WITH PROPOFOL N/A 06/14/2016   Procedure: COLONOSCOPY WITH PROPOFOL;  Surgeon: Lucilla Lame, MD;  Location: [facility];  Service: Endoscopy;  Laterality: N/A;   ESOPHAGOGASTRODUODENOSCOPY (EGD) WITH PROPOFOL N/A 06/14/2016   Procedure: ESOPHAGOGASTRODUODENOSCOPY (EGD) WITH PROPOFOL;  Surgeon: Lucilla Lame, MD;  Location: [place];  Service: Endoscopy;  Laterality: N/A;   ESOPHAGOGASTRODUODENOSCOPY (EGD) WITH PROPOFOL N/A 11/27/2021   Procedure: ESOPHAGOGASTRODUODENOSCOPY (EGD) WITH BIOPSY;  Surgeon: Lucilla Lame, MD;  Location: [place];  Service: Endoscopy;  Laterality: N/A;   HYSTEROSCOPY WITH D & C N/A 07/18/2021   Procedure: DILATATION AND CURETTAGE /HYSTEROSCOPY;  Surgeon: Malachy Mood, MD;  Location: [facility];  Service: Gynecology;  Laterality: N/A;   THORACIC AORTIC ANEURYSM REPAIR N/A 11/16/2020   Procedure: 4V FEVAR (CA, SMA, RRA, LRA) WITH PROXIMAL TEVAR EXTENSION WITH BILATERAL ILIAC LIMB EXTENSIONS; Location: [facility]; Surgeon; Vinnie Level, MD   TUBAL LIGATION      Current Medications: Current Meds  Medication Sig   ALPRAZolam (XANAX) 0.25 MG tablet Take 1 tablet by mouth as needed for anxiety   amLODipine (NORVASC) 2.5 MG tablet TAKE 1 TABLET (2.5 MG TOTAL) BY MOUTH DAILY. FOR HYPERTENSION   aspirin 81 MG chewable tablet Chew by mouth.   Aspirin-Caffeine 845-65 MG PACK Take 1 Dose by  mouth as needed.   levothyroxine (SYNTHROID) 50 MCG tablet TAKE 1 TABLET BY MOUTH EVERY MORNING 30 MINUTES PRIOR TO EATING FOOD   losartan (COZAAR) 25 MG tablet Take 25 mg by mouth daily.   pantoprazole (PROTONIX) 40 MG tablet TAKE ONE TABLET BY MOUTH ONCE DAILY   traMADol (ULTRAM) 50 MG tablet Take 1 tablet (50 mg total) by mouth daily as needed.   zolpidem (AMBIEN) 10 MG tablet TAKE 1 TABLET BY MOUTH EVERYDAY AT BEDTIME   [DISCONTINUED] atorvastatin (LIPITOR) 40 MG tablet Take 1 tablet (40 mg total) by mouth daily.   [DISCONTINUED] simvastatin (ZOCOR) 20 MG tablet TAKE  1 TABLET BY MOUTH EVERY DAY     Allergies:   Patient has no known allergies.   Social History   Socioeconomic History   Marital status: Divorced    Spouse name: Not on file   Number of children: Not on file   Years of education: Not on file   Highest education level: Not on file  Occupational History   Not on file  Tobacco Use   Smoking status: Every Day    Packs/day: 1.00    Years: 30.00    Total pack years: 30.00    Types: Cigarettes   Smokeless tobacco: Never   Tobacco comments:    1 pack daily  Vaping Use   Vaping Use: Never used  Substance and Sexual Activity   Alcohol use: No   Drug use: Never   Sexual activity: Not Currently    Birth control/protection: Post-menopausal  Other Topics Concern   Not on file  Social History Narrative   Lives in [place] with grand-daughter, currently staying with boyfriend   Social Determinants of Health   Financial Resource Strain: Not on file  Food Insecurity: Not on file  Transportation Needs: Not on file  Physical Activity: Not on file  Stress: Not on file  Social Connections: Not on file     Family History: The patient's family history includes Aneurysm in an other family member; Leukemia in her mother.  ROS:   Please see the history of present illness.     All other systems reviewed and are negative.  EKGs/Labs/Other Studies Reviewed:    The following studies were reviewed today:   EKG:  EKG is  ordered today.  The ekg ordered today demonstrates normal sinus rhythm, normal ECG  Recent Labs: 01/17/2022: BUN 18; Creatinine, Ser 1.03; Potassium 4.1; Sodium 140 07/02/2022: Hemoglobin 13.7; Platelets 185; TSH 1.510  Recent Lipid Panel    Component Value Date/Time   CHOL 178 07/02/2022 1436   TRIG 64 07/02/2022 1436   HDL 58 07/02/2022 1436   CHOLHDL 3.3 06/01/2019 0926   LDLCALC 108 (H) 07/02/2022 1436     Risk Assessment/Calculations:             Physical Exam:    VS:  BP 118/70 (BP Location: Right  Arm)   Pulse 75   Ht 5' 6"$  (1.676 m)   Wt 127 lb 6.4 oz (57.8 kg)   SpO2 98%   BMI 20.56 kg/m     Wt Readings from Last 3 Encounters:  11/28/22 127 lb 6.4 oz (57.8 kg)  10/22/22 130 lb (59 kg)  07/30/22 126 lb 3.2 oz (57.2 kg)     GEN:  Well nourished, well developed in no acute distress HEENT: Normal NECK: No JVD; No carotid bruits CARDIAC: RRR, no murmurs, rubs, gallops RESPIRATORY: Diminished breath sounds, no wheezing. ABDOMEN: Soft, non-tender, non-distended MUSCULOSKELETAL:  No edema; No deformity  SKIN: Warm and dry NEUROLOGIC:  Alert and oriented x 3 PSYCHIATRIC:  Normal affect   ASSESSMENT:    1. Precordial pain   2. Coronary artery disease involving native coronary artery of native heart, unspecified whether angina present   3. Primary hypertension   4. Pure hypercholesterolemia   5. Smoking   6. Chest pain, unspecified type    PLAN:    In order of problems listed above:  Chest pain, get echo, get Lexiscan Myoview. CAD/LAD and RCA calcifications on chest CT.  Echo and Myoview as above.  Continue aspirin 81 mg, start Lipitor 40 mg daily.  Stop simvastatin. Hypertension, BP controlled.  Continue losartan, Norvasc. Hyperlipidemia, LDL not at goal, start Lipitor 40 mg daily.  Stop simvastatin. Current smoker, smoking cessation advised.  Follow-up after cardiac testing      Shared Decision Making/Informed Consent The risks [chest pain, shortness of breath, cardiac arrhythmias, dizziness, blood pressure fluctuations, myocardial infarction, stroke/transient ischemic attack, nausea, vomiting, allergic reaction, radiation exposure, metallic taste sensation and life-threatening complications (estimated to be 1 in 10,000)], benefits (risk stratification, diagnosing coronary artery disease, treatment guidance) and alternatives of a nuclear stress test were discussed in detail with Ms. Tyrell and she agrees to proceed.    Medication Adjustments/Labs and Tests  Ordered: Current medicines are reviewed at length with the patient today.  Concerns regarding medicines are outlined above.  Orders Placed This Encounter  Procedures   NM Myocar Multi W/Spect W/Wall Motion / EF   EKG 12-Lead   ECHOCARDIOGRAM COMPLETE   Meds ordered this encounter  Medications   DISCONTD: atorvastatin (LIPITOR) 40 MG tablet    Sig: Take 1 tablet (40 mg total) by mouth daily.    Dispense:  90 tablet    Refill:  3   atorvastatin (LIPITOR) 40 MG tablet    Sig: Take 1 tablet (40 mg total) by mouth daily.    Dispense:  30 tablet    Refill:  2    Patient Instructions  Medication Instructions:   STOP Simvastatin 2. START Atorvastatin - take one tablet ( 40 mg) by mouth daily.   *If you need a refill on your cardiac medications before your next appointment, please call your pharmacy*   Lab Work:  None Ordered  If you have labs (blood work) drawn today and your tests are completely normal, you will receive your results only by: [place] (if you have MyChart) OR A paper copy in the mail If you have any lab test that is abnormal or we need to change your treatment, we will call you to review the results.   Testing/Procedures:  Your physician has requested that you have an echocardiogram. Echocardiography is a painless test that uses sound waves to create images of your heart. It provides your doctor with information about the size and shape of your heart and how well your heart's chambers and valves are working. This procedure takes approximately one hour. There are no restrictions for this procedure. Please do NOT wear cologne, perfume, aftershave, or lotions (deodorant is allowed). Please arrive 15 minutes prior to your appointment time.  [place]  Your caregiver has ordered a Stress Test with nuclear imaging. The purpose of this test is to evaluate the blood supply to your heart muscle. This procedure is referred to as a "Non-Invasive Stress Test."  This is because other than having an IV started in your vein, nothing is inserted or "invades" your body. Cardiac stress  tests are done to find areas of poor blood flow to the heart by determining the extent of coronary artery disease (CAD). Some patients exercise on a treadmill, which naturally increases the blood flow to your heart, while others who are  unable to walk on a treadmill due to physical limitations have a pharmacologic/chemical stress agent called Lexiscan . This medicine will mimic walking on a treadmill by temporarily increasing your coronary blood flow.   Please note: these test may take anywhere between 2-4 hours to complete  PLEASE REPORT TO [facility] AT THE FIRST DESK WILL DIRECT YOU WHERE TO GO  Date of Procedure:_____________________________________  Arrival Time for Procedure:______________________________   PLEASE NOTIFY THE OFFICE AT LEAST 24 HOURS IN ADVANCE IF YOU ARE UNABLE TO KEEP YOUR APPOINTMENT.  [phone] AND  PLEASE NOTIFY NUCLEAR MEDICINE AT [facility] AT LEAST 24 HOURS IN ADVANCE IF YOU ARE UNABLE TO KEEP YOUR APPOINTMENT. [phone]  How to prepare for your Myoview test:  Do not eat or drink after midnight No caffeine for 24 hours prior to test No smoking 24 hours prior to test. Your medication may be taken with water.  If your doctor stopped a medication because of this test, do not take that medication. Ladies, please do not wear dresses.  Skirts or pants are appropriate. Please wear a short sleeve shirt. No perfume, cologne or lotion. Wear comfortable walking shoes. No heels!    Follow-Up: At [facility], you and your health needs are our priority.  As part of our continuing mission to provide you with exceptional heart care, we have created designated Provider Care Teams.  These Care Teams include your primary Cardiologist (physician) and Advanced Practice Providers (APPs -  Physician Assistants and Nurse  Practitioners) who all work together to provide you with the care you need, when you need it.  We recommend signing up for the patient portal called "MyChart".  Sign up information is provided on this After Visit Summary.  MyChart is used to connect with patients for Virtual Visits (Telemedicine).  Patients are able to view lab/test results, encounter notes, upcoming appointments, etc.  Non-urgent messages can be sent to your provider as well.   To learn more about what you can do with MyChart, go to [email or web address].    Your next appointment:    After testing  Provider:   You may see Kate Sable, MD or one of the following Advanced Practice Providers on your designated Care Team:   Murray Hodgkins, NP Christell Faith, PA-C Cadence Kathlen Mody, PA-C Gerrie Nordmann, NP    Signed, Kate Sable, MD  11/28/2022 12:35 PM    [place]

## 2022-12-05 ENCOUNTER — Encounter
Admission: RE | Admit: 2022-12-05 | Discharge: 2022-12-05 | Disposition: A | Discharge status: Home / Self Care | Payer: Medicare HMO | Source: Ambulatory Visit | Attending: Cardiology | Admitting: Cardiology

## 2022-12-05 DIAGNOSIS — R079 Chest pain, unspecified: Secondary | ICD-10-CM | POA: Diagnosis not present

## 2022-12-05 LAB — NM MYOCAR MULTI W/SPECT W/WALL MOTION / EF
LV dias vol: 38 mL (ref 46–106)
LV sys vol: 9 mL
Nuc Stress EF: 76 %
Peak HR: 96 {beats}/min
Percent HR: 63 %
Rest HR: 61 {beats}/min
Rest Nuclear Isotope Dose: 10.3 mCi
SDS: 2
SRS: 1
SSS: 0
ST Depression (mm): 0 mm
Stress Nuclear Isotope Dose: 31.6 mCi
TID: 1.26

## 2022-12-05 MED ORDER — TECHNETIUM TC 99M TETROFOSMIN IV KIT
10.2700 | PACK | Freq: Once | INTRAVENOUS | Status: AC | PRN
  Administered 2022-12-05: 10.27 via INTRAVENOUS

## 2022-12-05 MED ORDER — TECHNETIUM TC 99M TETROFOSMIN IV KIT
31.5900 | PACK | Freq: Once | INTRAVENOUS | Status: AC | PRN
  Administered 2022-12-05: 31.59 via INTRAVENOUS

## 2022-12-05 MED ORDER — REGADENOSON 0.4 MG/5ML IV SOLN
0.4000 mg | Freq: Once | INTRAVENOUS | Status: AC
  Administered 2022-12-05: 0.4 mg via INTRAVENOUS
  Filled 2022-12-05: qty 5

## 2022-12-19 DIAGNOSIS — F17200 Nicotine dependence, unspecified, uncomplicated: Secondary | ICD-10-CM | POA: Diagnosis not present

## 2022-12-19 DIAGNOSIS — J439 Emphysema, unspecified: Secondary | ICD-10-CM | POA: Diagnosis not present

## 2022-12-19 DIAGNOSIS — I129 Hypertensive chronic kidney disease with stage 1 through stage 4 chronic kidney disease, or unspecified chronic kidney disease: Secondary | ICD-10-CM | POA: Diagnosis not present

## 2022-12-19 DIAGNOSIS — N1831 Chronic kidney disease, stage 3a: Secondary | ICD-10-CM | POA: Diagnosis not present

## 2022-12-25 ENCOUNTER — Ambulatory Visit: Payer: Medicare HMO | Attending: Cardiology

## 2022-12-25 DIAGNOSIS — R072 Precordial pain: Secondary | ICD-10-CM

## 2022-12-25 DIAGNOSIS — R079 Chest pain, unspecified: Secondary | ICD-10-CM | POA: Diagnosis not present

## 2022-12-26 LAB — ECHOCARDIOGRAM COMPLETE
AR max vel: 1.35 cm2
AV Area VTI: 1.51 cm2
AV Area mean vel: 1.42 cm2
AV Mean grad: 3 mmHg
AV Peak grad: 6.2 mmHg
Ao pk vel: 1.24 m/s
Area-P 1/2: 3.48 cm2
S' Lateral: 1.7 cm

## 2022-12-27 ENCOUNTER — Other Ambulatory Visit: Payer: Self-pay | Admitting: Physician Assistant

## 2022-12-27 DIAGNOSIS — E559 Vitamin D deficiency, unspecified: Secondary | ICD-10-CM

## 2022-12-27 DIAGNOSIS — R5383 Other fatigue: Secondary | ICD-10-CM

## 2022-12-27 DIAGNOSIS — F411 Generalized anxiety disorder: Secondary | ICD-10-CM

## 2022-12-27 DIAGNOSIS — Z0001 Encounter for general adult medical examination with abnormal findings: Secondary | ICD-10-CM

## 2022-12-27 DIAGNOSIS — Z1239 Encounter for other screening for malignant neoplasm of breast: Secondary | ICD-10-CM

## 2022-12-27 DIAGNOSIS — Z79899 Other long term (current) drug therapy: Secondary | ICD-10-CM

## 2022-12-27 DIAGNOSIS — K219 Gastro-esophageal reflux disease without esophagitis: Secondary | ICD-10-CM

## 2022-12-27 DIAGNOSIS — Z124 Encounter for screening for malignant neoplasm of cervix: Secondary | ICD-10-CM

## 2022-12-27 DIAGNOSIS — F5101 Primary insomnia: Secondary | ICD-10-CM

## 2022-12-27 DIAGNOSIS — R109 Unspecified abdominal pain: Secondary | ICD-10-CM

## 2022-12-27 DIAGNOSIS — R300 Dysuria: Secondary | ICD-10-CM

## 2022-12-31 ENCOUNTER — Other Ambulatory Visit: Payer: Self-pay | Admitting: Physician Assistant

## 2022-12-31 DIAGNOSIS — I10 Essential (primary) hypertension: Secondary | ICD-10-CM

## 2023-01-01 ENCOUNTER — Telehealth: Payer: Self-pay

## 2023-01-01 NOTE — Telephone Encounter (Signed)
[facility] called for amlodipine 2.5 mg refills called take 1 tab po daily 90 with 1 refills due to esend is not going through

## 2023-01-06 ENCOUNTER — Other Ambulatory Visit: Payer: Self-pay | Admitting: Physician Assistant

## 2023-01-06 DIAGNOSIS — F411 Generalized anxiety disorder: Secondary | ICD-10-CM

## 2023-01-06 DIAGNOSIS — G8929 Other chronic pain: Secondary | ICD-10-CM

## 2023-01-06 DIAGNOSIS — F5101 Primary insomnia: Secondary | ICD-10-CM

## 2023-01-07 NOTE — Telephone Encounter (Signed)
Next appt 01/21/23

## 2023-01-08 ENCOUNTER — Ambulatory Visit: Payer: Medicare HMO | Attending: Cardiology | Admitting: Cardiology

## 2023-01-08 ENCOUNTER — Encounter: Payer: Self-pay | Admitting: Cardiology

## 2023-01-08 VITALS — BP 100/70 | HR 81 | Ht 66.0 in | Wt 128.0 lb

## 2023-01-08 DIAGNOSIS — I10 Essential (primary) hypertension: Secondary | ICD-10-CM | POA: Diagnosis not present

## 2023-01-08 DIAGNOSIS — R0789 Other chest pain: Secondary | ICD-10-CM | POA: Diagnosis not present

## 2023-01-08 DIAGNOSIS — F17200 Nicotine dependence, unspecified, uncomplicated: Secondary | ICD-10-CM | POA: Diagnosis not present

## 2023-01-08 DIAGNOSIS — I2510 Atherosclerotic heart disease of native coronary artery without angina pectoris: Secondary | ICD-10-CM

## 2023-01-08 DIAGNOSIS — E785 Hyperlipidemia, unspecified: Secondary | ICD-10-CM | POA: Diagnosis not present

## 2023-01-08 NOTE — Progress Notes (Signed)
Cardiology Office Note:   Date:  01/08/2023  ID:  Joy Carpenter, DOB Jul 11, 1951, MRN [id number]  History of Present Illness:   Joy Carpenter is a 72 y.o. female with history of coronary artery disease with coronary calcifications on chest CT, hypertension, hyperlipidemia, AAA status post endograft in 2/22, current smoker x 40+ years who presents today for follow-up on previous complaints of chest pain with radiation into the left arm.  Patient was last seen in clinic 11/28/2022 by Dr.Agbor-Etang with complaints of chest pain and ongoing for approximately a year that seem to have worsened most recently.  Symptoms were not associated with exertion or rest.  She symptoms or pain radiating down her left arm.  She had an aneurysm status post endograft repair at [facility] in 22 and follows up with [facility] later yearly for monitoring.  Patient feels stressed due to family situations.  She was scheduled for an echocardiogram and a Lexiscan Myoview.  She returns to clinic today stating that she has been doing fairly well.  She did follow-up with nephrology and was started on losartan 25 mg daily.  She says occasionally her blood pressure does run 90-100 which is little bit lower for your and she has some occasional lightheadedness or dizziness when she bends over and stands back up with no other symptoms.  She had also recently just started atorvastatin in place of rosuvastatin.  She stated occasionally she does have muscle cramps to her bilateral lower extremities intermittently at night and occasionally leg cramps/ "charley horses".  She denies any numbness or tingling, change in the color or pain in her feet but feels like occasionally when she is sitting at her daughter's, she has decreased circulation to her bilateral lower extremities.  She is concerned that she was previously supposed to have surgery for hiatal hernia and continues to have abdominal fullness and pressure and thinks that that is what is  causing her other associated symptoms.  She continues to have chest pressure that starts in the left lower abdomen and up to her chest and down her left arm.  She states that it happens with rest and exertion.  There are no alleviating or aggravating factors.  She states been ongoing sensation for approximately 2 years at this point.  She also notes that she is under an increased amount of stress as of late as her boyfriend has been diagnosed with stage IV lung cancer and her daughter had a stroke in February.  She denies any hospitalizations or visits to the emergency department.  ROS: The remainder of the 10 point review of systems is negative with exception of what is listed in the HPI.  Studies Reviewed:    EKG: Sinus rhythm with rate of 81, left axis deviation  TTE 12/25/22  1. Left ventricular ejection fraction, by estimation, is 60 to 65%. The  left ventricle has normal function. The left ventricle has no regional  wall motion abnormalities. Left ventricular diastolic parameters are  consistent with Grade I diastolic  dysfunction (impaired relaxation). The average left ventricular global  longitudinal strain is -18.7 %.   2. Right ventricular systolic function is normal. The right ventricular  size is normal. Tricuspid regurgitation signal is inadequate for assessing  PA pressure.   3. The mitral valve is normal in structure. Mild mitral valve  regurgitation. No evidence of mitral stenosis.   4. The aortic valve is tricuspid. Aortic valve regurgitation is not  visualized. Aortic valve sclerosis/calcification is present, without any  evidence of aortic stenosis.   5. The inferior vena cava is normal in size with greater than 50%  respiratory variability, suggesting right atrial pressure of 3 mmHg.   Lexiscan Myoview 12/05/22 LV perfusion was normal Left ventricular function was normal.  Nuclear stress EF 76%.  End-diastolic cavity size is normal. Findings are consistent with no  ischemia.  The study is low risk  Risk Assessment/Calculations:              Physical Exam:   VS:  BP 100/70 (BP Location: Left Arm, Patient Position: Sitting, Cuff Size: Normal)   Pulse 81   Ht 5\' 6"  (1.676 m)   Wt 128 lb (58.1 kg)   SpO2 97%   BMI 20.66 kg/m    Wt Readings from Last 3 Encounters:  01/08/23 128 lb (58.1 kg)  11/28/22 127 lb 6.4 oz (57.8 kg)  10/22/22 130 lb (59 kg)     GEN: Well nourished, well developed in no acute distress NECK: No JVD; No carotid bruits CARDIAC: RRR, no murmurs, rubs, gallops RESPIRATORY:  Diminished to auscultation without rales, wheezing or rhonchi  ABDOMEN: Soft, non-tender, non-distended EXTREMITIES:  No edema; No deformity   ASSESSMENT AND PLAN:   Continue atypical chest and abdominal pressure.  Patient states that happens with rest and exertion has been ongoing for approximately 2 years.  She recently underwent Lexiscan Myoview which was a low risk scan that showed no evidence of ischemia.  She also had an echocardiogram that revealed an LVEF of 60 to 65%, with no regional wall motion abnormality.  EKG today revealed sinus rhythm without concern for ischemic changes.  Coronary artery disease involving the native coronary artery of the LAD and RCA calcifications found on chest CT.  Ischemic workup has been negative at this time.  Symptoms are unchanged from previous there is been no variation in frequency or intensity.  She has been continued on aspirin and atorvastatin.  She was just changed to atorvastatin from simvastatin with hepatic and lipid panel in 3 months.  Hypertension with blood pressure today 100/70.  She has been continued on amlodipine 2.5 mg daily losartan 25 mg daily.  Blood pressures have remained stable.  She becomes symptomatic due to hypotension she would benefit more from coming off of the amlodipine as losartan will allow for some kidney protection.  Hyperlipidemia with recent change from simvastatin to Lipitor.   Goal would be 72 or less with coronary artery disease found on chest CT. is currently maintained on atorvastatin 40 mg daily.  To get lipid panel scheduled for 3 months.  Current smoker with cessation recommended.  Disposition patient return to clinic to see MD/APP in 3 months or sooner if needed        Signed, Braeton Wolgamott, NP

## 2023-01-08 NOTE — Patient Instructions (Signed)
Medication Instructions:  No changes at this time.   *If you need a refill on your cardiac medications before your next appointment, please call your pharmacy*   Lab Work: Lipid & Liver panel to be done in 3 months. No appointment is needed. These are fasting labs so nothing to eat or drink after midnight except sip of water with your medications. Go to the [facility] entrance and check in at registration.   If you have labs (blood work) drawn today and your tests are completely normal, you will receive your results only by: [place] (if you have MyChart) OR A paper copy in the mail If you have any lab test that is abnormal or we need to change your treatment, we will call you to review the results.   Testing/Procedures: None   Follow-Up: At [facility], you and your health needs are our priority.  As part of our continuing mission to provide you with exceptional heart care, we have created designated Provider Care Teams.  These Care Teams include your primary Cardiologist (physician) and Advanced Practice Providers (APPs -  Physician Assistants and Nurse Practitioners) who all work together to provide you with the care you need, when you need it.    Your next appointment:   3 month(s)  Provider:   Kate Sable, MD or Gerrie Nordmann, NP

## 2023-01-21 ENCOUNTER — Ambulatory Visit (INDEPENDENT_AMBULATORY_CARE_PROVIDER_SITE_OTHER): Payer: Medicare HMO | Admitting: Physician Assistant

## 2023-01-21 ENCOUNTER — Encounter: Payer: Self-pay | Admitting: Physician Assistant

## 2023-01-21 VITALS — BP 110/65 | HR 82 | Temp 97.8°F | Resp 16 | Ht 66.0 in | Wt 129.2 lb

## 2023-01-21 DIAGNOSIS — K449 Diaphragmatic hernia without obstruction or gangrene: Secondary | ICD-10-CM

## 2023-01-21 DIAGNOSIS — F411 Generalized anxiety disorder: Secondary | ICD-10-CM | POA: Diagnosis not present

## 2023-01-21 DIAGNOSIS — I10 Essential (primary) hypertension: Secondary | ICD-10-CM

## 2023-01-21 NOTE — Progress Notes (Signed)
[facility] [address] [place], [place] [address]  Internal MEDICINE  Office Visit Note  Patient Name: Joy Carpenter  [id number]  [id number]  Date of Service: 01/21/2023  Chief Complaint  Patient presents with   Follow-up   Depression   Gastroesophageal Reflux   Hypertension   Hyperlipidemia    HPI Pt is here for routine follow up -Daughter had a stroke in Feb and did rehab and was improving, but now recently thought she had another one and is in the hospital again in [place]. Not sure that she had another but now concern due to hypotension -BF back on chemo as well -Saw nephrology and was put on losartan, but BP has been low and was told to hold amlodipine if low or symptomatic. She did hold this today and BP is stable. She ordered a cuff to monitor -Went to cardiology and they changed from simvastatin to atorvastatin. She had NM stress testing which looked ok, but she still has the same intermittent CP and arm pain and now her chronic left side abdominal pain has been worse as well.  -Thinks she is going to follow up about hernia now as nothing else can be found to explain her pain other than this. Now that cardiology has evaluated may be able to move forward with surgery as this was the original hol dup for moving forward -Get some foot cramps at night, not drinking much water and will increase  Current Medication: Outpatient Encounter Medications as of 01/21/2023  Medication Sig   ALPRAZolam (XANAX) 0.25 MG tablet TAKE 1 TABLET BY MOUTH AS NEEDED FOR ANXIETY   amLODipine (NORVASC) 2.5 MG tablet TAKE ONE TABLET BY MOUTH ONCE DAILY hypertension   aspirin 81 MG chewable tablet Chew by mouth.   Aspirin-Caffeine 845-65 MG PACK Take 1 Dose by mouth as needed.   atorvastatin (LIPITOR) 40 MG tablet Take 1 tablet (40 mg total) by mouth daily.   levothyroxine (SYNTHROID) 50 MCG tablet TAKE 1 TABLET BY MOUTH EVERY MORNING 30 MINUTES PRIOR TO EATING FOOD    losartan (COZAAR) 25 MG tablet Take 25 mg by mouth daily.   pantoprazole (PROTONIX) 40 MG tablet TAKE ONE TABLET BY MOUTH ONCE DAILY   traMADol (ULTRAM) 50 MG tablet TAKE 1 TABLET BY MOUTH DAILY AS NEEDED.   zolpidem (AMBIEN) 10 MG tablet TAKE 1 TABLET BY MOUTH EVERYDAY AT BEDTIME   No facility-administered encounter medications on file as of 01/21/2023.    Surgical History: Past Surgical History:  Procedure Laterality Date   COLONOSCOPY     COLONOSCOPY WITH PROPOFOL N/A 06/14/2016   Procedure: COLONOSCOPY WITH PROPOFOL;  Surgeon: Midge Minium, MD;  Location: [facility];  Service: Endoscopy;  Laterality: N/A;   ESOPHAGOGASTRODUODENOSCOPY (EGD) WITH PROPOFOL N/A 06/14/2016   Procedure: ESOPHAGOGASTRODUODENOSCOPY (EGD) WITH PROPOFOL;  Surgeon: Midge Minium, MD;  Location: [facility];  Service: Endoscopy;  Laterality: N/A;   ESOPHAGOGASTRODUODENOSCOPY (EGD) WITH PROPOFOL N/A 11/27/2021   Procedure: ESOPHAGOGASTRODUODENOSCOPY (EGD) WITH BIOPSY;  Surgeon: Midge Minium, MD;  Location: [facility];  Service: Endoscopy;  Laterality: N/A;   HYSTEROSCOPY WITH D & C N/A 07/18/2021   Procedure: DILATATION AND CURETTAGE /HYSTEROSCOPY;  Surgeon: Vena Austria, MD;  Location: [facility];  Service: Gynecology;  Laterality: N/A;   THORACIC AORTIC ANEURYSM REPAIR N/A 11/16/2020   Procedure: 4V FEVAR (CA, SMA, RRA, LRA) WITH PROXIMAL TEVAR EXTENSION WITH BILATERAL ILIAC LIMB EXTENSIONS; Location: [facility]; Surgeon; Bennie Pierini, MD   TUBAL LIGATION  Medical History: Past Medical History:  Diagnosis Date   Actinic keratosis 03/28/2021   right upper arm, bx proven   Anxiety    Carpal tunnel syndrome 2019   Depression    Emphysema of lung    "mild"   GERD (gastroesophageal reflux disease)    Headache    daily - stress   Herpes    History of hiatal hernia    Hyperlipidemia    Hypertension    Hypothyroidism    Insomnia    Osteoporosis    SCC (squamous cell carcinoma)  03/28/2021   left dorsal hand, [facility]   Thoracic aortic aneurysm (TAA)    a.) s/p 4v FEVAR (CA, SMA, RRA, LRA) and proximal TEVAR extention with BILATERAL iliac limb extensions on 11/16/2020   Vertigo    1 episode - approx 2010    Weakness of both legs    intermittent    Family History: Family History  Problem Relation Age of Onset   Leukemia Mother    Aneurysm Other     Social History   Socioeconomic History   Marital status: Divorced    Spouse name: Not on file   Number of children: Not on file   Years of education: Not on file   Highest education level: Not on file  Occupational History   Not on file  Tobacco Use   Smoking status: Every Day    Packs/day: 1.00    Years: 30.00    Additional pack years: 0.00    Total pack years: 30.00    Types: Cigarettes   Smokeless tobacco: Never   Tobacco comments:    1 pack daily  Vaping Use   Vaping Use: Never used  Substance and Sexual Activity   Alcohol use: No   Drug use: Never   Sexual activity: Not Currently    Birth control/protection: Post-menopausal  Other Topics Concern   Not on file  Social History Narrative   Lives in [place] with grand-daughter, currently staying with boyfriend   Social Determinants of Health   Financial Resource Strain: Not on file  Food Insecurity: Not on file  Transportation Needs: Not on file  Physical Activity: Not on file  Stress: Not on file  Social Connections: Not on file  Intimate Partner Violence: Not on file      Review of Systems  Constitutional:  Negative for chills, diaphoresis and fatigue.  HENT:  Negative for ear pain, postnasal drip and sinus pressure.   Eyes:  Negative for photophobia, discharge, redness, itching and visual disturbance.  Respiratory:  Negative for cough, shortness of breath and wheezing.   Cardiovascular:  Negative for palpitations and leg swelling.  Gastrointestinal:  Positive for abdominal pain. Negative for constipation, diarrhea, nausea and  vomiting.  Genitourinary:  Negative for dysuria and flank pain.  Musculoskeletal:  Negative for arthralgias, back pain, gait problem and neck pain.  Skin:  Negative for color change.  Allergic/Immunologic: Negative for environmental allergies and food allergies.  Neurological:  Negative for dizziness and headaches.  Hematological:  Does not bruise/bleed easily.  Psychiatric/Behavioral:  Negative for agitation, behavioral problems (depression) and hallucinations. The patient is nervous/anxious.     Vital Signs: BP 110/65   Pulse 82   Temp 97.8 F (36.6 C)   Resp 16   Ht  (1.676 m)   Wt 129 lb 3.2 oz (58.6 kg)   SpO2 98%   BMI 20.85 kg/m    Physical Exam Vitals and nursing note reviewed.  Constitutional:  General: She is not in acute distress.    Appearance: She is well-developed. She is not diaphoretic.  HENT:     Head: Normocephalic and atraumatic.     Mouth/Throat:     Pharynx: No oropharyngeal exudate.  Eyes:     Pupils: Pupils are equal, round, and reactive to light.  Neck:     Thyroid: No thyromegaly.     Vascular: No JVD.     Trachea: No tracheal deviation.  Cardiovascular:     Rate and Rhythm: Normal rate and regular rhythm.     Heart sounds: Normal heart sounds. No murmur heard.    No friction rub. No gallop.  Pulmonary:     Effort: Pulmonary effort is normal. No respiratory distress.     Breath sounds: No wheezing or rales.  Chest:     Chest wall: No tenderness.  Abdominal:     General: Bowel sounds are normal.     Palpations: Abdomen is soft.  Musculoskeletal:        General: Normal range of motion.     Cervical back: Normal range of motion and neck supple.  Lymphadenopathy:     Cervical: No cervical adenopathy.  Skin:    General: Skin is warm and dry.  Neurological:     Mental Status: She is alert and oriented to person, place, and time.     Cranial Nerves: No cranial nerve deficit.  Psychiatric:        Behavior: Behavior normal.         Thought Content: Thought content normal.        Judgment: Judgment normal.        Assessment/Plan: 1. Generalized anxiety disorder May continue xanax as needed as before  2. Hernia, hiatal Will follow up with [facility] for possible surgery as no other explanation for her intermittent chest and abdominal pain  3. Essential hypertension Stable since holding amlodipine and will monitor   General Counseling: meilin brosh understanding of the findings of todays visit and agrees with plan of treatment. I have discussed any further diagnostic evaluation that may be needed or ordered today. We also reviewed her medications today. she has been encouraged to call the office with any questions or concerns that should arise related to todays visit.    No orders of the defined types were placed in this encounter.   No orders of the defined types were placed in this encounter.   This patient was seen by Lynn Ito, PA-C in collaboration with Dr. Beverely Risen as a part of collaborative care agreement.   Total time spent:30 Minutes Time spent includes review of chart, medications, test results, and follow up plan with the patient.      Dr Lyndon Code Internal medicine

## 2023-02-04 DIAGNOSIS — H16223 Keratoconjunctivitis sicca, not specified as Sjogren's, bilateral: Secondary | ICD-10-CM | POA: Diagnosis not present

## 2023-02-19 ENCOUNTER — Other Ambulatory Visit: Payer: Self-pay

## 2023-02-19 MED ORDER — ATORVASTATIN CALCIUM 40 MG PO TABS: 40.0000 mg | ORAL_TABLET | Freq: Every day | ORAL | 2 refills | Status: DC

## 2023-03-06 ENCOUNTER — Other Ambulatory Visit: Payer: Self-pay | Admitting: Physician Assistant

## 2023-03-06 DIAGNOSIS — G8929 Other chronic pain: Secondary | ICD-10-CM

## 2023-03-06 DIAGNOSIS — F411 Generalized anxiety disorder: Secondary | ICD-10-CM

## 2023-03-06 NOTE — Telephone Encounter (Signed)
Last 4/24 and next 7/24 

## 2023-03-21 ENCOUNTER — Other Ambulatory Visit: Payer: Self-pay | Admitting: Physician Assistant

## 2023-03-21 DIAGNOSIS — E039 Hypothyroidism, unspecified: Secondary | ICD-10-CM

## 2023-04-22 ENCOUNTER — Other Ambulatory Visit
Admission: RE | Admit: 2023-04-22 | Discharge: 2023-04-22 | Disposition: A | Discharge status: Home / Self Care | Payer: Medicare HMO | Source: Ambulatory Visit | Attending: Cardiology | Admitting: Cardiology

## 2023-04-22 ENCOUNTER — Ambulatory Visit (INDEPENDENT_AMBULATORY_CARE_PROVIDER_SITE_OTHER): Payer: Medicare HMO | Admitting: Physician Assistant

## 2023-04-22 ENCOUNTER — Encounter: Payer: Self-pay | Admitting: Physician Assistant

## 2023-04-22 VITALS — BP 125/70 | HR 87 | Temp 98.1°F | Resp 16 | Ht 66.0 in | Wt 128.2 lb

## 2023-04-22 DIAGNOSIS — F411 Generalized anxiety disorder: Secondary | ICD-10-CM | POA: Diagnosis not present

## 2023-04-22 DIAGNOSIS — G8929 Other chronic pain: Secondary | ICD-10-CM | POA: Diagnosis not present

## 2023-04-22 DIAGNOSIS — I2510 Atherosclerotic heart disease of native coronary artery without angina pectoris: Secondary | ICD-10-CM | POA: Insufficient documentation

## 2023-04-22 DIAGNOSIS — E039 Hypothyroidism, unspecified: Secondary | ICD-10-CM | POA: Diagnosis not present

## 2023-04-22 DIAGNOSIS — Z1231 Encounter for screening mammogram for malignant neoplasm of breast: Secondary | ICD-10-CM

## 2023-04-22 DIAGNOSIS — I10 Essential (primary) hypertension: Secondary | ICD-10-CM

## 2023-04-22 DIAGNOSIS — R1032 Left lower quadrant pain: Secondary | ICD-10-CM

## 2023-04-22 DIAGNOSIS — F5101 Primary insomnia: Secondary | ICD-10-CM

## 2023-04-22 LAB — LIPID PANEL
Cholesterol: 135 mg/dL (ref 0–200)
HDL: 53 mg/dL (ref >40)
LDL Cholesterol: 73 mg/dL (ref 0–99)
Total CHOL/HDL Ratio: 2.5 RATIO
Triglycerides: 46 mg/dL (ref <150)
VLDL: 9 mg/dL (ref 0–40)

## 2023-04-22 LAB — HEPATIC FUNCTION PANEL
ALT: 10 U/L (ref 0–44)
AST: 15 U/L (ref 15–41)
Albumin: 4 g/dL (ref 3.5–5.0)
Alkaline Phosphatase: 118 U/L (ref 38–126)
Bilirubin, Direct: <0.1 mg/dL (ref 0.0–0.2)
Total Bilirubin: 0.7 mg/dL (ref 0.3–1.2)
Total Protein: 6.7 g/dL (ref 6.5–8.1)

## 2023-04-22 MED ORDER — TRAMADOL HCL 50 MG PO TABS
50.0000 mg | ORAL_TABLET | Freq: Every day | ORAL | 1 refills | Status: DC | PRN
Start: 2023-05-02 — End: 2023-10-28

## 2023-04-22 MED ORDER — ZOLPIDEM TARTRATE 10 MG PO TABS
ORAL_TABLET | ORAL | 1 refills | Status: DC
Start: 2023-05-02 — End: 2023-10-31

## 2023-04-22 MED ORDER — ALPRAZOLAM 0.25 MG PO TABS
ORAL_TABLET | ORAL | 1 refills | Status: DC
Start: 2023-05-02 — End: 2023-06-19

## 2023-04-22 NOTE — Progress Notes (Signed)
[facility] [address] [place], [place] [address]  Internal MEDICINE  Office Visit Note  Patient Name: Joy Carpenter  [id number]  [id number]  Date of Service: 04/22/2023  Chief Complaint  Patient presents with   Follow-up   Depression   Gastroesophageal Reflux   Hypertension   Hyperlipidemia    HPI Pt is here for routine follow up -Did have an episode of vertigo last week, but was mild. Happened when she went to look up and was careful with changing position and did better after a day or 2. -Had a hx of vertigo a long time ago that was more severe -Some intermittent swelling in feet, [place]. Always goes back down again -seeing cardiology tomorrow -CPE next visit, will need to check thyroid, other labs done by outside providers -Some cramping in toes, discussed staying hydrated. She is trying mustard as well. Did discuss possible S/e of statin but it is tolerable, just annoying and will try to increase water  Current Medication: Outpatient Encounter Medications as of 04/22/2023  Medication Sig   amLODipine (NORVASC) 2.5 MG tablet TAKE ONE TABLET BY MOUTH ONCE DAILY hypertension   aspirin 81 MG chewable tablet Chew by mouth.   Aspirin-Caffeine 845-65 MG PACK Take 1 Dose by mouth as needed.   atorvastatin (LIPITOR) 40 MG tablet Take 1 tablet (40 mg total) by mouth daily.   levothyroxine (SYNTHROID) 50 MCG tablet TAKE ONE TABLET BY MOUTH EVERY MORNING   losartan (COZAAR) 25 MG tablet Take 25 mg by mouth daily.   pantoprazole (PROTONIX) 40 MG tablet TAKE ONE TABLET BY MOUTH ONCE DAILY   [DISCONTINUED] ALPRAZolam (XANAX) 0.25 MG tablet TAKE 1 TABLET BY MOUTH AS NEEDED FOR ANXIETY   [DISCONTINUED] traMADol (ULTRAM) 50 MG tablet TAKE 1 TABLET BY MOUTH EVERY DAY AS NEEDED   [DISCONTINUED] zolpidem (AMBIEN) 10 MG tablet TAKE 1 TABLET BY MOUTH EVERYDAY AT BEDTIME   [START ON 05/02/2023] ALPRAZolam (XANAX) 0.25 MG  tablet Take 1 tablet by mouth as needed for anxiety   [START ON 05/02/2023] traMADol (ULTRAM) 50 MG tablet Take 1 tablet (50 mg total) by mouth daily as needed.   [START ON 05/02/2023] zolpidem (AMBIEN) 10 MG tablet TAKE 1 TABLET BY MOUTH EVERYDAY AT BEDTIME   No facility-administered encounter medications on file as of 04/22/2023.    Surgical History: Past Surgical History:  Procedure Laterality Date   COLONOSCOPY     COLONOSCOPY WITH PROPOFOL N/A 06/14/2016   Procedure: COLONOSCOPY WITH PROPOFOL;  Surgeon: Midge Minium, MD;  Location: [facility];  Service: Endoscopy;  Laterality: N/A;   ESOPHAGOGASTRODUODENOSCOPY (EGD) WITH PROPOFOL N/A 06/14/2016   Procedure: ESOPHAGOGASTRODUODENOSCOPY (EGD) WITH PROPOFOL;  Surgeon: Midge Minium, MD;  Location: [facility];  Service: Endoscopy;  Laterality: N/A;   ESOPHAGOGASTRODUODENOSCOPY (EGD) WITH PROPOFOL N/A 11/27/2021   Procedure: ESOPHAGOGASTRODUODENOSCOPY (EGD) WITH BIOPSY;  Surgeon: Midge Minium, MD;  Location: [facility];  Service: Endoscopy;  Laterality: N/A;   HYSTEROSCOPY WITH D & C N/A 07/18/2021   Procedure: DILATATION AND CURETTAGE /HYSTEROSCOPY;  Surgeon: Vena Austria, MD;  Location: [facility];  Service: Gynecology;  Laterality: N/A;   THORACIC AORTIC ANEURYSM REPAIR N/A 11/16/2020   Procedure: 4V FEVAR (CA, SMA, RRA, LRA) WITH PROXIMAL TEVAR EXTENSION WITH BILATERAL ILIAC LIMB EXTENSIONS; Location: [facility]; Surgeon; Bennie Pierini, MD   TUBAL LIGATION      Medical History: Past Medical History:  Diagnosis Date   Actinic keratosis  03/28/2021   right upper arm, bx proven   Anxiety    Carpal tunnel syndrome 2019   Depression    Emphysema of lung (HCC)    "mild"   GERD (gastroesophageal reflux disease)    Headache    daily - stress   Herpes    History of hiatal hernia    Hyperlipidemia    Hypertension    Hypothyroidism    Insomnia    Osteoporosis    SCC (squamous cell carcinoma) 03/28/2021   left dorsal  hand, ED[facility]   Thoracic aortic aneurysm (TAA) (HCC)    a.) s/p 4v FEVAR (CA, SMA, RRA, LRA) and proximal TEVAR extention with BILATERAL iliac limb extensions on 11/16/2020   Vertigo    1 episode - approx 2010    Weakness of both legs    intermittent    Family History: Family History  Problem Relation Age of Onset   Leukemia Mother    Aneurysm Other     Social History   Socioeconomic History   Marital status: Divorced    Spouse name: Not on file   Number of children: Not on file   Years of education: Not on file   Highest education level: Not on file  Occupational History   Not on file  Tobacco Use   Smoking status: Every Day    Current packs/day: 1.00    Average packs/day: 1 pack/day for 30.0 years (30.0 ttl pk-yrs)    Types: Cigarettes   Smokeless tobacco: Never   Tobacco comments:    1 pack daily  Vaping Use   Vaping status: Never Used  Substance and Sexual Activity   Alcohol use: No   Drug use: Never   Sexual activity: Not Currently    Birth control/protection: Post-menopausal  Other Topics Concern   Not on file  Social History Narrative   Lives in [place] with grand-daughter, currently staying with boyfriend   Social Determinants of Health   Financial Resource Strain: Low Risk  (11/17/2020)   Received from [facility]   Overall Financial Resource Strain (CARDIA)    Difficulty of Paying Living Expenses: Not hard at all  Food Insecurity: No Food Insecurity (11/17/2020)   Received from [facility]   Hunger Vital Sign    Worried About Running Out of Food in the Last Year: Never true    Ran Out of Food in the Last Year: Never true  Transportation Needs: No Transportation Needs (11/17/2020)   Received from [facility]   PRAPARE - Transportation    Lack of Transportation (Medical): No    Lack of Transportation (Non-Medical): No  Physical Activity: Not on file  Stress: Not on file  Social Connections: Not on file  Intimate Partner Violence: Not on  file      Review of Systems  Constitutional:  Negative for chills, diaphoresis and fatigue.  HENT:  Negative for ear pain, postnasal drip and sinus pressure.   Eyes:  Negative for photophobia, discharge, redness, itching and visual disturbance.  Respiratory:  Negative for cough, shortness of breath and wheezing.   Cardiovascular:  Negative for palpitations and leg swelling.  Gastrointestinal:  Positive for abdominal pain. Negative for constipation, diarrhea, nausea and vomiting.  Genitourinary:  Negative for dysuria and flank pain.  Musculoskeletal:  Positive for myalgias. Negative for arthralgias, back pain, gait problem and neck pain.  Skin:  Negative for color change.  Allergic/Immunologic: Negative for environmental allergies and food allergies.  Neurological:  Negative for  dizziness and headaches.  Hematological:  Does not bruise/bleed easily.  Psychiatric/Behavioral:  Negative for agitation, behavioral problems (depression) and hallucinations. The patient is nervous/anxious.     Vital Signs: BP 125/70   Pulse 87   Temp 98.1 F (36.7 C)   Resp 16   Ht 5\' 6"  (1.676 m)   Wt 128 lb 3.2 oz (58.2 kg)   SpO2 97%   BMI 20.69 kg/m    Physical Exam Vitals and nursing note reviewed.  Constitutional:      General: She is not in acute distress.    Appearance: She is well-developed. She is not diaphoretic.  HENT:     Head: Normocephalic and atraumatic.     Mouth/Throat:     Pharynx: No oropharyngeal exudate.  Eyes:     Pupils: Pupils are equal, round, and reactive to light.  Neck:     Thyroid: No thyromegaly.     Vascular: No JVD.     Trachea: No tracheal deviation.  Cardiovascular:     Rate and Rhythm: Normal rate and regular rhythm.     Heart sounds: Normal heart sounds. No murmur heard.    No friction rub. No gallop.  Pulmonary:     Effort: Pulmonary effort is normal. No respiratory distress.     Breath sounds: No wheezing or rales.  Chest:     Chest wall: No  tenderness.  Abdominal:     General: Bowel sounds are normal.     Palpations: Abdomen is soft.  Musculoskeletal:        General: Normal range of motion.     Cervical back: Normal range of motion and neck supple.  Lymphadenopathy:     Cervical: No cervical adenopathy.  Skin:    General: Skin is warm and dry.  Neurological:     Mental Status: She is alert and oriented to person, place, and time.     Cranial Nerves: No cranial nerve deficit.  Psychiatric:        Behavior: Behavior normal.        Thought Content: Thought content normal.        Judgment: Judgment normal.        Assessment/Plan: 1. Essential hypertension Stable, continue current medication  2. Acquired hypothyroidism Will update labs and adjust Synthroid as indicated - TSH + free T4  3. Generalized anxiety disorder May continue Xanax as needed - ALPRAZolam (XANAX) 0.25 MG tablet; Take 1 tablet by mouth as needed for anxiety  Dispense: 30 tablet; Refill: 1  4. Chronic LLQ pain May continue tramadol as needed - traMADol (ULTRAM) 50 MG tablet; Take 1 tablet (50 mg total) by mouth daily as needed.  Dispense: 30 tablet; Refill: 1  5. Primary insomnia - zolpidem (AMBIEN) 10 MG tablet; TAKE 1 TABLET BY MOUTH EVERYDAY AT BEDTIME  Dispense: 30 tablet; Refill: 1  6. Visit for screening mammogram - MM 3D SCREENING MAMMOGRAM BILATERAL BREAST; Future    General Counseling: chrystian ressler understanding of the findings of todays visit and agrees with plan of treatment. I have discussed any further diagnostic evaluation that may be needed or ordered today. We also reviewed her medications today. she has been encouraged to call the office with any questions or concerns that should arise related to todays visit.    Orders Placed This Encounter  Procedures   MM 3D SCREENING MAMMOGRAM BILATERAL BREAST   TSH + free T4    Meds ordered this encounter  Medications   ALPRAZolam (XANAX) 0.25 MG  tablet    Sig: Take 1  tablet by mouth as needed for anxiety    Dispense:  30 tablet    Refill:  1    Not to exceed 5 additional fills before 04/13/2023   traMADol (ULTRAM) 50 MG tablet    Sig: Take 1 tablet (50 mg total) by mouth daily as needed.    Dispense:  30 tablet    Refill:  1    This request is for a new prescription for a controlled substance as required by Federal/State law.   zolpidem (AMBIEN) 10 MG tablet    Sig: TAKE 1 TABLET BY MOUTH EVERYDAY AT BEDTIME    Dispense:  30 tablet    Refill:  1    This request is for a new prescription for a controlled substance as required by Federal/State law.    This patient was seen by Lynn Ito, PA-C in collaboration with Dr. Beverely Risen as a part of collaborative care agreement.   Total time spent:30 Minutes Time spent includes review of chart, medications, test results, and follow up plan with the patient.      Dr Lyndon Code Internal medicine

## 2023-04-22 NOTE — Progress Notes (Signed)
Liver functions are stable and bad cholesterol or LDL and drastically improved. Continue current medication regimen without changes.

## 2023-04-23 ENCOUNTER — Ambulatory Visit: Payer: Medicare HMO | Admitting: Cardiology

## 2023-05-14 ENCOUNTER — Ambulatory Visit: Payer: Medicare HMO | Attending: Cardiology | Admitting: Cardiology

## 2023-05-14 ENCOUNTER — Encounter: Payer: Self-pay | Admitting: Cardiology

## 2023-05-14 VITALS — BP 108/75 | HR 68 | Ht 66.0 in | Wt 125.0 lb

## 2023-05-14 DIAGNOSIS — I10 Essential (primary) hypertension: Secondary | ICD-10-CM

## 2023-05-14 DIAGNOSIS — I2510 Atherosclerotic heart disease of native coronary artery without angina pectoris: Secondary | ICD-10-CM | POA: Diagnosis not present

## 2023-05-14 DIAGNOSIS — E785 Hyperlipidemia, unspecified: Secondary | ICD-10-CM

## 2023-05-14 DIAGNOSIS — Z1231 Encounter for screening mammogram for malignant neoplasm of breast: Secondary | ICD-10-CM | POA: Diagnosis not present

## 2023-05-14 DIAGNOSIS — R0789 Other chest pain: Secondary | ICD-10-CM | POA: Diagnosis not present

## 2023-05-14 DIAGNOSIS — I739 Peripheral vascular disease, unspecified: Secondary | ICD-10-CM | POA: Diagnosis not present

## 2023-05-14 DIAGNOSIS — I714 Abdominal aortic aneurysm, without rupture, unspecified: Secondary | ICD-10-CM | POA: Diagnosis not present

## 2023-05-14 DIAGNOSIS — F17200 Nicotine dependence, unspecified, uncomplicated: Secondary | ICD-10-CM

## 2023-05-14 NOTE — Progress Notes (Signed)
Cardiology Office Note:  .   Date:  05/14/2023  ID:  Joy Carpenter, DOB 18-Aug-1951, MRN [id number] PCP: Alan Ripper  [facility] Providers Cardiologist:  Debbe Odea, MD    History of Present Illness: .   Joy Carpenter is a 72 y.o. female with a history of coronary artery disease and coronary calcifications of the chest on chest CT, hypertension, hyperlipidemia, AAA status post endograft in 2/22, current smoker x 40+ years who presents today for follow-up.  She had complaints of chest pain that been ongoing for approximately a year that seem to worsen in February 2024.  Symptoms were not associated with exertion or rest.  Her symptoms were pain radiating down her left arm.  She had an aneurysm status post endograft repair at [facility] in 2022 and follows with [facility] yearly for monitoring.  She feels stressed due to family situations and she was scheduled for an echocardiogram and a Lexiscan Myoview at her last clinic appointment 11/28/2022.  She return to clinic in 01/08/2023 overall doing fairly well.  She did follow-up with nephrology.  Recently started on losartan 25 mg daily.  She occasionally was experiencing muscle cramps in the lower extremities and continued atypical chest and abdominal pressure with unrevealing studies.  She returns to clinic today she has been doing fairly well from a cardiac standpoint.  She continues to have chest pressure that starts in the lower abdomen and radiates up into her chest that has been unchanged and the frequency has not changed.  She continues to have a sharp stabbing pain to the left side.  She states that she had followed up with vascular and was advised that it is not related to his stent that she had placed.  She said there are no alleviating or aggravating factors.  This has been going on for approximately 2 years at this point.  She states that she did have some swelling to bilateral lower extremities after flying to [place] and driving a [employer] back and then on the second occasion when she had gone to the beach for a long weekend.  She is concerned in that with a moderately sized hiatal hernia she had previously had she was supposed to have surgery with surgery was canceled when anesthesia had opted for cardiology clearance.  At that time she did not call back to have the surgery reschedule after she was seen in clinic but with the pain that she has been having primarily in the abdomen radiating into the chest or groin she feels as though it might be hernia related and is going to follow back up with general surgery.  Denies any hospitalizations or visits to the emergency department.  ROS: 10 point review of systems has been completed and considered negative with exception of what is been listed in the HPI  Studies Reviewed: Marland Kitchen        TTE 12/25/22  1. Left ventricular ejection fraction, by estimation, is 60 to 65%. The  left ventricle has normal function. The left ventricle has no regional  wall motion abnormalities. Left ventricular diastolic parameters are  consistent with Grade I diastolic  dysfunction (impaired relaxation). The average left ventricular global  longitudinal strain is -18.7 %.   2. Right ventricular systolic function is normal. The right ventricular  size is normal. Tricuspid regurgitation signal is inadequate for assessing  PA pressure.   3. The mitral valve is normal in structure. Mild mitral valve  regurgitation. No evidence  of mitral stenosis.   4. The aortic valve is tricuspid. Aortic valve regurgitation is not  visualized. Aortic valve sclerosis/calcification is present, without any  evidence of aortic stenosis.   5. The inferior vena cava is normal in size with greater than 50%  respiratory variability, suggesting right atrial pressure of 3 mmHg.    Lexiscan Myoview 12/05/22 LV perfusion was normal Left ventricular function was normal.  Nuclear stress EF 76%.  End-diastolic  cavity size is normal. Findings are consistent with no ischemia.  The study is low risk Risk Assessment/Calculations:             Physical Exam:   VS:  BP 108/75 (BP Location: Left Arm, Patient Position: Sitting, Cuff Size: Normal)   Pulse 68   Ht 5\' 6"  (1.676 m)   Wt 125 lb (56.7 kg)   SpO2 99%   BMI 20.18 kg/m    Wt Readings from Last 3 Encounters:  05/14/23 125 lb (56.7 kg)  04/22/23 128 lb 3.2 oz (58.2 kg)  01/21/23 129 lb 3.2 oz (58.6 kg)    GEN: Well nourished, well developed in no acute distress NECK: No JVD; No carotid bruits CARDIAC: RRR, no murmurs, rubs, gallops RESPIRATORY: Diminished to auscultation without rales, wheezing or rhonchi  ABDOMEN: Soft, non-tender, non-distended EXTREMITIES:  No edema; No deformity   ASSESSMENT AND PLAN: .   Atypical chest and abdominal pressure.  Patient states has been going ongoing for over 2 years.  Cardiac workup has been unrevealing.  She was sent to GYN which workup was unrevealing there as well.  She is going to follow-up with general surgery and see if it is likely related to her previous moderately sized hiatal hernia.  Coronary artery disease involving native coronary artery of the LAD and the RCA.  She had coronary calcifications found on chest CT.  Ischemic workup has been negative at this time.  Symptoms are unchanged for over 2 years at this point.  She is continued on aspirin 81 mg daily and atorvastatin 40 mg daily.  Primary hypertension with blood pressure today 108/75.  She has been continued on amlodipine 2.5 mg daily and losartan 25 mg daily.  Blood pressures have remained stable.  She is continue to monitor her pressures 1 to 2 hours post medications if able.  Mixed hyperlipidemia with an LDL of 73 after being changed from simvastatin to atorvastatin.  See huge improvement after previous LDL of 121.  Peripheral arterial disease with extensive previous surgery by vascular for abdominal aortic aneurysm with endograft  4  V FEVAR in 2022.  Continues to follow with vascular at [facility].  Tobacco abuse with total cessation recommended       Dispo: Patient to return to clinic to see MD/APP in 5 to 6 months or sooner if needed  Signed,  , NP

## 2023-05-14 NOTE — Patient Instructions (Signed)
Medication Instructions:  Your physician recommends that you continue on your current medications as directed. Please refer to the Current Medication list given to you today.  *If you need a refill on your cardiac medications before your next appointment, please call your pharmacy*   Lab Work: none If you have labs (blood work) drawn today and your tests are completely normal, you will receive your results only by: MyChart Message (if you have MyChart) OR A paper copy in the mail If you have any lab test that is abnormal or we need to change your treatment, we will call you to review the results.   Testing/Procedures: none  Follow-Up: At [facility], you and your health needs are our priority.  As part of our continuing mission to provide you with exceptional heart care, we have created designated Provider Care Teams.  These Care Teams include your primary Cardiologist (physician) and Advanced Practice Providers (APPs -  Physician Assistants and Nurse Practitioners) who all work together to provide you with the care you need, when you need it.  We recommend signing up for the patient portal called "MyChart".  Sign up information is provided on this After Visit Summary.  MyChart is used to connect with patients for Virtual Visits (Telemedicine).  Patients are able to view lab/test results, encounter notes, upcoming appointments, etc.  Non-urgent messages can be sent to your provider as well.   To learn more about what you can do with MyChart, go to [email or web address].    Your next appointment:   5-6 month follow up   Provider:   You may see Debbe Odea, MD or one of the following Advanced Practice Providers on your designated Care Team:    Charlsie Quest, NP

## 2023-05-27 ENCOUNTER — Telehealth: Payer: Self-pay

## 2023-05-28 NOTE — Telephone Encounter (Signed)
Pharmacy said it was 6 days to early but he get it filled for her.

## 2023-05-28 NOTE — Telephone Encounter (Signed)
Pt.notified

## 2023-06-17 ENCOUNTER — Other Ambulatory Visit: Payer: Self-pay | Admitting: Physician Assistant

## 2023-06-17 ENCOUNTER — Other Ambulatory Visit: Payer: Self-pay

## 2023-06-17 DIAGNOSIS — Z1239 Encounter for other screening for malignant neoplasm of breast: Secondary | ICD-10-CM

## 2023-06-17 DIAGNOSIS — I10 Essential (primary) hypertension: Secondary | ICD-10-CM

## 2023-06-17 DIAGNOSIS — K219 Gastro-esophageal reflux disease without esophagitis: Secondary | ICD-10-CM

## 2023-06-17 DIAGNOSIS — E039 Hypothyroidism, unspecified: Secondary | ICD-10-CM

## 2023-06-17 DIAGNOSIS — Z79899 Other long term (current) drug therapy: Secondary | ICD-10-CM

## 2023-06-17 DIAGNOSIS — E559 Vitamin D deficiency, unspecified: Secondary | ICD-10-CM

## 2023-06-17 DIAGNOSIS — F5101 Primary insomnia: Secondary | ICD-10-CM

## 2023-06-17 DIAGNOSIS — R300 Dysuria: Secondary | ICD-10-CM

## 2023-06-17 DIAGNOSIS — R109 Unspecified abdominal pain: Secondary | ICD-10-CM

## 2023-06-17 DIAGNOSIS — Z0001 Encounter for general adult medical examination with abnormal findings: Secondary | ICD-10-CM

## 2023-06-17 DIAGNOSIS — F411 Generalized anxiety disorder: Secondary | ICD-10-CM

## 2023-06-17 DIAGNOSIS — R5383 Other fatigue: Secondary | ICD-10-CM

## 2023-06-17 DIAGNOSIS — Z124 Encounter for screening for malignant neoplasm of cervix: Secondary | ICD-10-CM

## 2023-06-17 MED ORDER — LEVOTHYROXINE SODIUM 50 MCG PO TABS
50.0000 ug | ORAL_TABLET | Freq: Every morning | ORAL | 1 refills | Status: DC
Start: 2023-06-17 — End: 2024-03-09

## 2023-06-17 MED ORDER — AMLODIPINE BESYLATE 2.5 MG PO TABS
ORAL_TABLET | ORAL | 1 refills | Status: DC
Start: 2023-06-17 — End: 2024-03-09

## 2023-06-17 MED ORDER — ATORVASTATIN CALCIUM 40 MG PO TABS: 40.0000 mg | ORAL_TABLET | Freq: Every day | ORAL | 3 refills | Status: DC

## 2023-06-18 DIAGNOSIS — F17200 Nicotine dependence, unspecified, uncomplicated: Secondary | ICD-10-CM | POA: Diagnosis not present

## 2023-06-18 DIAGNOSIS — I129 Hypertensive chronic kidney disease with stage 1 through stage 4 chronic kidney disease, or unspecified chronic kidney disease: Secondary | ICD-10-CM | POA: Diagnosis not present

## 2023-06-18 DIAGNOSIS — N1831 Chronic kidney disease, stage 3a: Secondary | ICD-10-CM | POA: Diagnosis not present

## 2023-06-19 ENCOUNTER — Telehealth: Payer: Self-pay | Admitting: Physician Assistant

## 2023-06-19 ENCOUNTER — Other Ambulatory Visit: Payer: Self-pay | Admitting: Physician Assistant

## 2023-06-19 DIAGNOSIS — F411 Generalized anxiety disorder: Secondary | ICD-10-CM

## 2023-06-19 MED ORDER — ALPRAZOLAM 0.25 MG PO TABS
ORAL_TABLET | ORAL | 0 refills | Status: DC
Start: 2023-06-28 — End: 2023-08-01

## 2023-06-19 NOTE — Telephone Encounter (Signed)
Error

## 2023-06-24 DIAGNOSIS — I129 Hypertensive chronic kidney disease with stage 1 through stage 4 chronic kidney disease, or unspecified chronic kidney disease: Secondary | ICD-10-CM | POA: Diagnosis not present

## 2023-06-24 DIAGNOSIS — N1831 Chronic kidney disease, stage 3a: Secondary | ICD-10-CM | POA: Diagnosis not present

## 2023-06-27 ENCOUNTER — Ambulatory Visit: Payer: Medicare HMO | Admitting: Physician Assistant

## 2023-07-26 ENCOUNTER — Ambulatory Visit
Admission: RE | Admit: 2023-07-26 | Discharge: 2023-07-26 | Disposition: A | Discharge status: Home / Self Care | Payer: Medicare HMO | Source: Ambulatory Visit | Attending: Physician Assistant | Admitting: Physician Assistant

## 2023-07-26 DIAGNOSIS — F17210 Nicotine dependence, cigarettes, uncomplicated: Secondary | ICD-10-CM | POA: Insufficient documentation

## 2023-07-26 DIAGNOSIS — Z87891 Personal history of nicotine dependence: Secondary | ICD-10-CM

## 2023-07-26 DIAGNOSIS — Z122 Encounter for screening for malignant neoplasm of respiratory organs: Secondary | ICD-10-CM | POA: Insufficient documentation

## 2023-08-01 ENCOUNTER — Ambulatory Visit: Payer: Medicare HMO | Admitting: Physician Assistant

## 2023-08-01 ENCOUNTER — Encounter: Payer: Self-pay | Admitting: Physician Assistant

## 2023-08-01 ENCOUNTER — Ambulatory Visit: Payer: Medicare HMO | Admitting: Dermatology

## 2023-08-01 ENCOUNTER — Encounter: Payer: Self-pay | Admitting: Dermatology

## 2023-08-01 VITALS — BP 103/70 | HR 74 | Temp 98.4°F | Resp 16 | Ht 66.0 in | Wt 125.6 lb

## 2023-08-01 DIAGNOSIS — F411 Generalized anxiety disorder: Secondary | ICD-10-CM

## 2023-08-01 DIAGNOSIS — Z0000 Encounter for general adult medical examination without abnormal findings: Secondary | ICD-10-CM | POA: Diagnosis not present

## 2023-08-01 DIAGNOSIS — D492 Neoplasm of unspecified behavior of bone, soft tissue, and skin: Secondary | ICD-10-CM

## 2023-08-01 DIAGNOSIS — J069 Acute upper respiratory infection, unspecified: Secondary | ICD-10-CM

## 2023-08-01 DIAGNOSIS — L820 Inflamed seborrheic keratosis: Secondary | ICD-10-CM

## 2023-08-01 DIAGNOSIS — D0462 Carcinoma in situ of skin of left upper limb, including shoulder: Secondary | ICD-10-CM

## 2023-08-01 DIAGNOSIS — D099 Carcinoma in situ, unspecified: Secondary | ICD-10-CM

## 2023-08-01 DIAGNOSIS — L570 Actinic keratosis: Secondary | ICD-10-CM | POA: Diagnosis not present

## 2023-08-01 DIAGNOSIS — E039 Hypothyroidism, unspecified: Secondary | ICD-10-CM | POA: Diagnosis not present

## 2023-08-01 HISTORY — DX: Carcinoma in situ, unspecified: D09.9

## 2023-08-01 MED ORDER — ALPRAZOLAM 0.25 MG PO TABS: ORAL_TABLET | ORAL | 2 refills | Status: DC

## 2023-08-01 MED ORDER — AZITHROMYCIN 250 MG PO TABS
ORAL_TABLET | ORAL | 0 refills | Status: AC
Start: 2023-08-01 — End: 2023-08-06

## 2023-08-01 MED ORDER — VALACYCLOVIR HCL 500 MG PO TABS: 500.0000 mg | ORAL_TABLET | Freq: Every day | ORAL | 1 refills | Status: DC

## 2023-08-01 NOTE — Patient Instructions (Signed)
Wound Care Instructions  Cleanse wound gently with soap and water once a day then pat dry with clean gauze. Apply a thin coat of Petrolatum (petroleum jelly, "Vaseline") over the wound (unless you have an allergy to this). We recommend that you use a new, sterile tube of Vaseline. Do not pick or remove scabs. Do not remove the yellow or white "healing tissue" from the base of the wound.  Cover the wound with fresh, clean, nonstick gauze and secure with paper tape. You may use Band-Aids in place of gauze and tape if the wound is small enough, but would recommend trimming much of the tape off as there is often too much. Sometimes Band-Aids can irritate the skin.  You should call the office for your biopsy report after 1 week if you have not already been contacted.  If you experience any problems, such as abnormal amounts of bleeding, swelling, significant bruising, significant pain, or evidence of infection, please call the office immediately.  FOR ADULT SURGERY PATIENTS: If you need something for pain relief you may take 1 extra strength Tylenol (acetaminophen) AND 2 Ibuprofen (200mg  each) together every 4 hours as needed for pain. (do not take these if you are allergic to them or if you have a reason you should not take them.) Typically, you may only need pain medication for 1 to 3 days.    Recommend daily broad spectrum sunscreen SPF 30+ to sun-exposed areas, reapply every 2 hours as needed. Call for new or changing lesions.  Staying in the shade or wearing long sleeves, sun glasses (UVA+UVB protection) and wide brim hats (4-inch brim around the entire circumference of the hat) are also recommended for sun protection.    Due to recent changes in healthcare laws, you may see results of your pathology and/or laboratory studies on MyChart before the doctors have had a chance to review them. We understand that in some cases there may be results that are confusing or concerning to you. Please understand  that not all results are received at the same time and often the doctors may need to interpret multiple results in order to provide you with the best plan of care or course of treatment. Therefore, we ask that you please give [place] 2 business days to thoroughly review all your results before contacting the office for clarification. Should we see a critical lab result, you will be contacted sooner.   If You Need Anything After Your Visit  If you have any questions or concerns for your doctor, please call our main line at [phone] and press option 4 to reach your doctor's medical assistant. If no one answers, please leave a voicemail as directed and we will return your call as soon as possible. Messages left after 4 pm will be answered the following business day.   You may also send [place] a message via MyChart. We typically respond to MyChart messages within 1-2 business days.  For prescription refills, please ask your pharmacy to contact our office. Our fax number is [phone].  If you have an urgent issue when the clinic is closed that cannot wait until the next business day, you can page your doctor at the number below.    Please note that while we do our best to be available for urgent issues outside of office hours, we are not available 24/7.   If you have an urgent issue and are unable to reach [place], you may choose to seek medical care at your doctor's office,  retail clinic, urgent care center, or emergency room.  If you have a medical emergency, please immediately call 911 or go to the emergency department.  Pager Numbers  - Dr. Gwen Pounds: [phone]  - Dr. Roseanne Reno: [phone]  - Dr. Katrinka Blazing: [phone]   In the event of inclement weather, please call our main line at [phone] for an update on the status of any delays or closures.  Dermatology Medication Tips: Please keep the boxes that topical medications come in in order to help keep track of the instructions about where  and how to use these. Pharmacies typically print the medication instructions only on the boxes and not directly on the medication tubes.   If your medication is too expensive, please contact our office at [phone] option 4 or send [place] a message through MyChart.   We are unable to tell what your co-pay for medications will be in advance as this is different depending on your insurance coverage. However, we may be able to find a substitute medication at lower cost or fill out paperwork to get insurance to cover a needed medication.   If a prior authorization is required to get your medication covered by your insurance company, please allow [place] 1-2 business days to complete this process.  Drug prices often vary depending on where the prescription is filled and some pharmacies may offer cheaper prices.  The website www.goodrx.com contains coupons for medications through different pharmacies. The prices here do not account for what the cost may be with help from insurance (it may be cheaper with your insurance), but the website can give you the price if you did not use any insurance.  - You can print the associated coupon and take it with your prescription to the pharmacy.  - You may also stop by our office during regular business hours and pick up a GoodRx coupon card.  - If you need your prescription sent electronically to a different pharmacy, notify our office through [facility] or by phone at [phone] option 4.     Si Usted Necesita Algo Despus de Su Visita  Tambin puede enviarnos un mensaje a travs de Clinical cytogeneticist. Por lo general respondemos a los mensajes de MyChart en el transcurso de 1 a 2 das hbiles.  Para renovar recetas, por favor pida a su farmacia que se ponga en contacto con nuestra oficina. Annie Sable de fax es [place] [phone].  Si tiene un asunto urgente cuando la clnica est cerrada y que no puede esperar hasta el siguiente da hbil, puede llamar/localizar a  su doctor(a) al nmero que aparece a continuacin.   Por favor, tenga en cuenta que aunque hacemos todo lo posible para estar disponibles para asuntos urgentes fuera del horario de [place], no estamos disponibles las 24 horas del da, los 7 [address]  [address] de la [place].   Si tiene un problema urgente y no puede comunicarse con nosotros, puede optar por buscar atencin mdica  en el consultorio de su doctor(a), en una clnica privada, en un centro de atencin urgente o en una sala de emergencias.  Si tiene Engineer, drilling, por favor llame inmediatamente al 911 o vaya a la sala de emergencias.  Nmeros de bper  - Dr. Gwen Pounds: [phone]  - Dra. Roseanne Reno: [phone]  - Dr. Katrinka Blazing: [phone]   En caso de inclemencias del tiempo, por favor llame a Lacy Duverney principal al [phone] para una actualizacin sobre el [place] de cualquier retraso o cierre.  Consejos para la medicacin en dermatologa: Por  favor, guarde las cajas en las que vienen los medicamentos de uso tpico para ayudarle a seguir las instrucciones sobre dnde y cmo usarlos. Las farmacias generalmente imprimen las instrucciones del medicamento slo en las cajas y no directamente en los tubos del [place].   Si su medicamento es muy caro, por favor, pngase en contacto con Rolm Gala llamando al [phone] y presione la opcin 4 o envenos un mensaje a travs de Clinical cytogeneticist.   No podemos decirle cul ser su copago por los medicamentos por adelantado ya que esto es diferente dependiendo de la cobertura de su seguro. Sin embargo, es posible que podamos encontrar un medicamento sustituto a Audiological scientist un formulario para que el seguro cubra el medicamento que se considera necesario.   Si se requiere una autorizacin previa para que su compaa de seguros [place] su medicamento, por favor permtanos de 1 a 2 das hbiles para completar [address]  Los precios de los medicamentos varan con frecuencia dependiendo  del Environmental consultant de dnde se surte la receta y alguna farmacias pueden ofrecer precios ms baratos.  El sitio web www.goodrx.com tiene cupones para medicamentos de Health and safety inspector. Los precios aqu no tienen en cuenta lo que podra costar con la ayuda del seguro (puede ser ms barato con su seguro), pero el sitio web puede darle el precio si no utiliz Tourist information centre manager.  - Puede imprimir el cupn correspondiente y llevarlo con su receta a la farmacia.  - Tambin puede pasar por nuestra oficina durante el horario de atencin regular y Education officer, museum una tarjeta de cupones de GoodRx.  - Si necesita que su receta se enve electrnicamente a una farmacia diferente, informe a nuestra oficina a travs de MyChart de [place] o por telfono llamando al [phone] y presione la opcin 4.

## 2023-08-01 NOTE — Progress Notes (Signed)
[facility] [address] [place], [place] [address] [phone]  Patient Name: Joy Carpenter DOB: 1951/01/31 MRN: [id number]  Date of Service: 08/07/23   Medicare Annual Wellness Visit (Subsequent)  PCP: Carlean Jews, PA-C Cardiologist: Debbe Odea, MD  Nephrologist: Mosetta Pigeon, MD Vascular: Bennie Pierini, MD  Subjective:  CC -- Medicare Annual wellness visit.  Patient reports Having some congestion and had a fever over the weekend. A little chest tightness with deep breaths and congestion. Denies CP or palpitations. -She reports that her boyfriend is now refusing cancer treatment now and has come home on hospice. This has added stress, but she is managing. Does need her med refils -mammogram UTD -Still needs to have labs -Did some labs via kidney  -lung cancer CT chest screening done, but results not available yet  General Healthcare: Medication Compliance: yes  ASCVD Risk as of 08/07/23: 12%  Aspirin: yes  Dx Hypertension: yes   Dx Hyperlipidemia: no  Diabetes: no  Dx Obesity: no  Weight Loss: no   The 10-year ASCVD risk score (Arnett DK, et al., 2019) is: 12%   Values used to calculate the score:     Age: 72 years     Sex: Female     Is Non-Hispanic African American: No     Diabetic: No     Tobacco smoker: Yes     Systolic Blood Pressure: 103 mmHg     Is BP treated: Yes     HDL Cholesterol: 53 mg/dL     Total Cholesterol: 135 mg/dL   Social Determinants of Health: SDOH Screenings   Food Insecurity: No Food Insecurity (11/17/2020)   Received from [facility], [facility]  Transportation Needs: No Transportation Needs (11/17/2020)   Received from [facility], [facility]  Alcohol Screen: Low Risk  (04/22/2023)  Depression (PHQ2-9): Low Risk  (08/01/2023)  Financial Resource Strain: Low Risk  (11/17/2020)   Received from [facility], [facility]  Tobacco Use: High Risk (08/01/2023)     Cancer:   Colorectal >> Colonoscopy: yes Due date: 2027 Lung >> Tobacco Use: yes   - If so, previous Low-Dose CT screen: yes, results pending  Breast >> Mammogram: yes       08/01/2023    1:25 PM 06/21/2022   10:00 AM 06/19/2021    3:12 PM  MMSE - Mini Mental State Exam  Orientation to time 5 5 5   Orientation to Place 5 5 5   Registration 3 3 3   Attention/ Calculation 5 5 5   Recall 3 3 3   Language- name 2 objects 2 2 2   Language- repeat 1 1 1   Language- follow 3 step command 3 3 3   Language- read & follow direction 1 1 1   Write a sentence 1 1 1   Copy design 1 1 1   Total score 30 30 30     Functional Status Survey: Is the patient deaf or have difficulty hearing?: No Does the patient have difficulty seeing, even when wearing glasses/contacts?: No Does the patient have difficulty concentrating, remembering, or making decisions?: No Does the patient have difficulty walking or climbing stairs?: No Does the patient have difficulty dressing or bathing?: No Does the patient have difficulty doing errands alone such as visiting a doctor's office or shopping?: No      03/19/2022    2:20 PM 06/21/2022    9:59 AM 01/21/2023   11:19 AM 04/22/2023   11:41 AM 08/01/2023    1:24  PM  Fall Risk  Falls in the past year? 0 0 0 0 0        08/01/2023    1:24 PM  Depression screen PHQ 2/9  Decreased Interest 0  Down, Depressed, Hopeless 0  PHQ - 2 Score 0     Review of Systems  Constitutional:  Negative for weight loss.  HENT:  Positive for congestion.   Respiratory:  Positive for cough. Negative for shortness of breath.        Chest tightness with deep breath  Cardiovascular:  Negative for chest pain and palpitations.  Genitourinary:  Negative for frequency.  Skin:  Negative for rash.  Neurological:  Negative for weakness.  Psychiatric/Behavioral:  The patient is nervous/anxious.      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) 04/19/2020   Urinary tract infection with hematuria 01/23/2020   Abdominal pain 01/23/2020   Acquired hypothyroidism 10/11/2019   Need for vaccination against Streptococcus pneumoniae using pneumococcal conjugate vaccine 13 10/11/2019   Laceration of right lower extremity 10/11/2019   Right upper quadrant pain 06/17/2019   Gallbladder sludge 06/17/2019   Other specified diseases of gallbladder 06/17/2019   Gastroesophageal reflux disease without esophagitis 04/30/2019   Vitamin D deficiency 04/30/2019   Other fatigue 04/30/2019   Primary generalized (osteo)arthritis 12/26/2018   Atopic dermatitis 12/26/2018   Acute upper respiratory infection 08/27/2018   Carpal tunnel syndrome on both sides 08/27/2018   Chronic idiopathic constipation 05/18/2018   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/2018   Dysuria 05/18/2018   Left sided abdominal pain 01/04/2018   Primary insomnia 01/04/2018   Generalized anxiety disorder 01/04/2018   Tobacco use disorder 07/31/2016   Hyperlipidemia 07/31/2016   Atherosclerosis of native arteries of extremity with intermittent claudication (HCC) 07/31/2016   Abdominal aortic aneurysm (AAA) without rupture (HCC) 07/31/2016   Abdominal pain, epigastric    Abnormal findings-gastrointestinal tract    Gastritis    Other specified disease of esophagus    Encounter for long-term (current) use of medications     Medications- reviewed and updated Current Outpatient Medications  Medication Sig Dispense Refill   amLODipine (NORVASC) 2.5 MG tablet TAKE ONE TABLET BY MOUTH ONCE DAILY hypertension 90 tablet 1   aspirin 81 MG chewable tablet Chew by mouth.     Aspirin-Caffeine 845-65 MG PACK Take 1 Dose by mouth as needed.     atorvastatin (LIPITOR) 40 MG tablet  Take 1 tablet (40 mg total) by mouth daily. 90 tablet 3   levothyroxine (SYNTHROID) 50 MCG tablet Take 1 tablet (50 mcg total) by mouth every morning. 90 tablet 1   losartan (COZAAR) 25 MG tablet Take 25 mg by mouth daily.     pantoprazole (PROTONIX) 40 MG tablet TAKE 1 TABLET BY MOUTH EVERY DAY 90 tablet 1   traMADol (ULTRAM) 50 MG tablet Take 1 tablet (50 mg total) by mouth daily as needed. 30 tablet 1   zolpidem (AMBIEN) 10 MG tablet TAKE 1 TABLET BY MOUTH EVERYDAY AT BEDTIME 30 tablet 1   ALPRAZolam (XANAX) 0.25 MG tablet Take 1 tablet by mouth as needed for anxiety 30 tablet 2   valACYclovir (VALTREX) 500 MG tablet Take 1 tablet (500 mg total) by mouth daily. 30  tablet 1   No current facility-administered medications for this visit.    Objective: BP 103/70   Pulse 74   Temp 98.4 F (36.9 C)   Resp 16   Ht 5\' 6"  (1.676 m)   Wt 125 lb 9.6 oz (57 kg)   SpO2 98%   BMI 20.27 kg/m  Gen: NAD, alert, cooperative with exam HEENT: NCAT, EOMI, PERRL CV: RRR, good S1/S2, no murmur Resp: CTABL, no wheezes, non-labored Abd: Soft, Non Tender, Non Distended, BS present, no guarding or organomegaly Ext: No edema, warm Neuro: Alert and oriented, No gross deficits   Assessment/Plan: 1. Encounter for subsequent annual wellness visit (AWV) in Medicare patient AWV performed, labs previously ordered, mammogram, colon, and lung screenings UTD  2. Upper respiratory tract infection, unspecified type Will start on zpak and patient will call if not improving.  - azithromycin (ZITHROMAX) 250 MG tablet; Take 2 tablets on day 1, then 1 tablet daily on days 2 through 5  Dispense: 6 tablet; Refill: 0  3. Generalized anxiety disorder - ALPRAZolam (XANAX) 0.25 MG tablet; Take 1 tablet by mouth as needed for anxiety  Dispense: 30 tablet; Refill: 2 [place] Controlled Substance Database was reviewed by me for overdose risk score (ORS)   Total time spent:35 Minutes Time spent includes review of chart,  medications, test results, and follow up plan with the patient.    Controlled Substance Database was reviewed by me.  This patient was seen by Lynn Ito, PA-C in collaboration with Dr. Beverely Risen as a part of collaborative care agreement.  Lynn Ito, PA-C Internal medicine/Primary Care [facility]

## 2023-08-01 NOTE — Progress Notes (Signed)
Follow-Up Visit   Subjective  Joy Carpenter is a 72 y.o. female who presents for the following: Spots. Left chest, left lower leg. Lesion on chest was larger and like a pimple ~6 months ago. Lesion on leg gets dry at times.   The patient has spots, moles and lesions to be evaluated, some may be new or changing and the patient may have concern these could be cancer.    The following portions of the chart were reviewed this encounter and updated as appropriate: medications, allergies, medical history  Review of Systems:  No other skin or systemic complaints except as noted in HPI or Assessment and Plan.  Objective  Well appearing patient in no apparent distress; mood and affect are within normal limits.  A focused examination was performed of the following areas: Chest, left lower leg  Relevant physical exam findings are noted in the Assessment and Plan.  Left Chest 8 mm keratotic papule         Left Shoulder - Anterior 11 mm keratotic pink plaque       Left Lower Leg - Anterior 12 mm  keratotic plaque           Assessment & Plan   Neoplasm of skin (3) Left Chest  Skin / nail biopsy Type of biopsy: tangential   Informed consent: discussed and consent obtained   Timeout: patient name, date of birth, surgical site, and procedure verified   Procedure prep:  Patient was prepped and draped in usual sterile fashion Prep type:  Isopropyl alcohol Anesthesia: the lesion was anesthetized in a standard fashion   Anesthetic:  1% lidocaine w/ epinephrine 1-100,000 buffered w/ 8.4% NaHCO3 Instrument used: DermaBlade   Hemostasis achieved with: pressure and aluminum chloride   Outcome: patient tolerated procedure well   Post-procedure details: sterile dressing applied and wound care instructions given   Dressing type: bandage and petrolatum    Specimen 1 - Surgical pathology Differential Diagnosis: ISK vs SCC  Check Margins: No  Left Shoulder -  Anterior  Skin / nail biopsy Type of biopsy: tangential   Informed consent: discussed and consent obtained   Timeout: patient name, date of birth, surgical site, and procedure verified   Procedure prep:  Patient was prepped and draped in usual sterile fashion Prep type:  Isopropyl alcohol Anesthesia: the lesion was anesthetized in a standard fashion   Anesthetic:  1% lidocaine w/ epinephrine 1-100,000 buffered w/ 8.4% NaHCO3 Instrument used: DermaBlade   Hemostasis achieved with: pressure and aluminum chloride   Outcome: patient tolerated procedure well   Post-procedure details: sterile dressing applied and wound care instructions given   Dressing type: bandage and petrolatum    Specimen 2 - Surgical pathology Differential Diagnosis: ISK vs SCC  Check Margins: No  Left Lower Leg - Anterior  Skin / nail biopsy Type of biopsy: tangential   Informed consent: discussed and consent obtained   Timeout: patient name, date of birth, surgical site, and procedure verified   Procedure prep:  Patient was prepped and draped in usual sterile fashion Prep type:  Isopropyl alcohol Anesthesia: the lesion was anesthetized in a standard fashion   Anesthetic:  1% lidocaine w/ epinephrine 1-100,000 buffered w/ 8.4% NaHCO3 Instrument used: DermaBlade   Hemostasis achieved with: pressure and aluminum chloride   Outcome: patient tolerated procedure well   Post-procedure details: sterile dressing applied and wound care instructions given   Dressing type: bandage and petrolatum    Specimen 3 - Surgical pathology Differential  Diagnosis: ISK vs SCC  Check Margins: No     Return if symptoms worsen or fail to improve.  I, Lawson Radar, CMA, am acting as scribe for Elie Goody, MD.   Documentation: I have reviewed the above documentation for accuracy and completeness, and I agree with the above.  Elie Goody, MD

## 2023-08-02 ENCOUNTER — Telehealth: Payer: Self-pay

## 2023-08-02 LAB — TSH+FREE T4
Free T4: 1.32 ng/dL (ref 0.82–1.77)
TSH: 0.78 u[IU]/mL (ref 0.450–4.500)

## 2023-08-02 NOTE — Telephone Encounter (Signed)
Spoke with patient regarding lab results. 

## 2023-08-02 NOTE — Telephone Encounter (Signed)
-----   Message from Carlean Jews sent at 08/02/2023 12:17 PM EDT ----- Please let her know her thyroid labs were normal

## 2023-08-06 LAB — SURGICAL PATHOLOGY

## 2023-08-12 ENCOUNTER — Telehealth: Payer: Self-pay

## 2023-08-12 NOTE — Telephone Encounter (Signed)
Reviewed pathology results with patient. Patient prefers EDC to treat SCCis. Scheduled for [facility] 11/19 and TBSE 11/14/23. Butch Penny., RMA

## 2023-08-12 NOTE — Telephone Encounter (Signed)
-----   Message from [facility] sent at 08/11/2023  8:31 AM EST ----- Diagnosis 1. Skin, left chest :       SEBORRHEIC KERATOSIS, IRRITATED        2. Skin, left shoulder - anterior :       SQUAMOUS CELL CARCINOMA IN SITU        3. Skin, left lower leg - anterior :       HYPERTROPHIC ACTINIC KERATOSIS AND SEBORRHEIC KERATOSIS     Please call with diagnoses and message me with patient's decision on treatment.  1. Left chest: The spot we removed was a benign thickening of the top layer of skin (inflamed seborrheic keratosis). There was no skin cancer in the biopsy and you do not need further treatment.   2. Left shoulder: This is a squamous cell skin cancer limited to the top layer of skin. This means it is an early cancer and has not spread. However, it has the potential to spread beyond the skin and threaten your health, so we recommend treating it.   Treatment option 1: a cream (fluorouracil and calcipotriene) that helps your immune system clear the skin cancer. It will cause redness and irritation. Wait two weeks after the biopsy to start applying the cream. Apply the cream twice per day until the redness and irritation develop (usually occurs by day 7), then stop and allow it to heal. We will recheck the area in 2 months to ensure the cancer is gone. The cream is $35 plus shipping and will be mailed to you from a low cost compounding pharmacy. If the skin cancer comes back, we would need to do a surgery to remove it.  Treatment option 2: you return for a brief appointment where I perform electrodesiccation and curettage [facility]). This involves three rounds of scraping and burning to destroy the skin cancer. It has about an 85% cure rate and leaves a round wound slightly larger than the skin cancer and leaves a round white scar. No additional pathology is done. If the skin cancer comes back, we would need to do a surgery to remove it.  3. Left lower leg: The spot we removed was partially a  precancer and partially a benign thickening of the top layer of skin (inflamed seborrheic keratosis). There was no skin cancer in the biopsy and the precancer usually resolves after biopsy. Please return in 3 months so we can recheck it and for a full body skin check.

## 2023-08-20 ENCOUNTER — Other Ambulatory Visit: Payer: Self-pay | Admitting: Acute Care

## 2023-08-20 DIAGNOSIS — F17210 Nicotine dependence, cigarettes, uncomplicated: Secondary | ICD-10-CM

## 2023-08-20 DIAGNOSIS — Z122 Encounter for screening for malignant neoplasm of respiratory organs: Secondary | ICD-10-CM

## 2023-08-20 DIAGNOSIS — Z87891 Personal history of nicotine dependence: Secondary | ICD-10-CM

## 2023-08-23 ENCOUNTER — Other Ambulatory Visit: Payer: Self-pay | Admitting: Physician Assistant

## 2023-08-27 ENCOUNTER — Encounter: Payer: Self-pay | Admitting: Dermatology

## 2023-08-27 ENCOUNTER — Ambulatory Visit: Payer: Medicare HMO | Admitting: Dermatology

## 2023-08-27 DIAGNOSIS — T148XXA Other injury of unspecified body region, initial encounter: Secondary | ICD-10-CM

## 2023-08-27 DIAGNOSIS — L570 Actinic keratosis: Secondary | ICD-10-CM | POA: Diagnosis not present

## 2023-08-27 DIAGNOSIS — D099 Carcinoma in situ, unspecified: Secondary | ICD-10-CM

## 2023-08-27 DIAGNOSIS — W908XXA Exposure to other nonionizing radiation, initial encounter: Secondary | ICD-10-CM

## 2023-08-27 DIAGNOSIS — D0462 Carcinoma in situ of skin of left upper limb, including shoulder: Secondary | ICD-10-CM | POA: Diagnosis not present

## 2023-08-27 DIAGNOSIS — Z5189 Encounter for other specified aftercare: Secondary | ICD-10-CM

## 2023-08-27 DIAGNOSIS — S81802A Unspecified open wound, left lower leg, initial encounter: Secondary | ICD-10-CM

## 2023-08-27 NOTE — Patient Instructions (Signed)
Cryotherapy Aftercare  Wash gently with soap and water everyday.   Apply Vaseline and Band-Aid daily until healed.    Wound Care Instructions  Cleanse wound gently with soap and water once a day then pat dry with clean gauze. Apply a thin coat of Petrolatum (petroleum jelly, "Vaseline") over the wound (unless you have an allergy to this). We recommend that you use a new, sterile tube of Vaseline. Do not pick or remove scabs. Do not remove the yellow or white "healing tissue" from the base of the wound.  Cover the wound with fresh, clean, nonstick gauze and secure with paper tape. You may use Band-Aids in place of gauze and tape if the wound is small enough, but would recommend trimming much of the tape off as there is often too much. Sometimes Band-Aids can irritate the skin.  You should call the office for your biopsy report after 1 week if you have not already been contacted.  If you experience any problems, such as abnormal amounts of bleeding, swelling, significant bruising, significant pain, or evidence of infection, please call the office immediately.  FOR ADULT SURGERY PATIENTS: If you need something for pain relief you may take 1 extra strength Tylenol (acetaminophen) AND 2 Ibuprofen (200mg  each) together every 4 hours as needed for pain. (do not take these if you are allergic to them or if you have a reason you should not take them.) Typically, you may only need pain medication for 1 to 3 days.    Due to recent changes in healthcare laws, you may see results of your pathology and/or laboratory studies on MyChart before the doctors have had a chance to review them. We understand that in some cases there may be results that are confusing or concerning to you. Please understand that not all results are received at the same time and often the doctors may need to interpret multiple results in order to provide you with the best plan of care or course of treatment. Therefore, we ask that you  please give [place] 2 business days to thoroughly review all your results before contacting the office for clarification. Should we see a critical lab result, you will be contacted sooner.   If You Need Anything After Your Visit  If you have any questions or concerns for your doctor, please call our main line at [phone] and press option 4 to reach your doctor's medical assistant. If no one answers, please leave a voicemail as directed and we will return your call as soon as possible. Messages left after 4 pm will be answered the following business day.   You may also send [place] a message via MyChart. We typically respond to MyChart messages within 1-2 business days.  For prescription refills, please ask your pharmacy to contact our office. Our fax number is [phone].  If you have an urgent issue when the clinic is closed that cannot wait until the next business day, you can page your doctor at the number below.    Please note that while we do our best to be available for urgent issues outside of office hours, we are not available 24/7.   If you have an urgent issue and are unable to reach [place], you may choose to seek medical care at your doctor's office, retail clinic, urgent care center, or emergency room.  If you have a medical emergency, please immediately call 911 or go to the emergency department.  Pager Numbers  - Dr. Gwen Pounds: [phone]  -  Dr. Roseanne Reno: [phone]  - Dr. Katrinka Blazing: [phone]   In the event of inclement weather, please call our main line at [phone] for an update on the status of any delays or closures.  Dermatology Medication Tips: Please keep the boxes that topical medications come in in order to help keep track of the instructions about where and how to use these. Pharmacies typically print the medication instructions only on the boxes and not directly on the medication tubes.   If your medication is too expensive, please contact our office at  [phone] option 4 or send [place] a message through MyChart.   We are unable to tell what your co-pay for medications will be in advance as this is different depending on your insurance coverage. However, we may be able to find a substitute medication at lower cost or fill out paperwork to get insurance to cover a needed medication.   If a prior authorization is required to get your medication covered by your insurance company, please allow [place] 1-2 business days to complete this process.  Drug prices often vary depending on where the prescription is filled and some pharmacies may offer cheaper prices.  The website www.goodrx.com contains coupons for medications through different pharmacies. The prices here do not account for what the cost may be with help from insurance (it may be cheaper with your insurance), but the website can give you the price if you did not use any insurance.  - You can print the associated coupon and take it with your prescription to the pharmacy.  - You may also stop by our office during regular business hours and pick up a GoodRx coupon card.  - If you need your prescription sent electronically to a different pharmacy, notify our office through [facility] or by phone at [phone] option 4.

## 2023-08-27 NOTE — Progress Notes (Signed)
   Follow-Up Visit   Subjective  Joy Carpenter is a 72 y.o. female who presents for the following: EDC for bx proven SCCis at left anterior shoulder. Patient advises bx site at left lower leg does not seem to be healing. Also concerned about bx proven AK at right upper arm that was treated with LN2 by Dr. Neale Burly 03/2021.  The patient has spots, moles and lesions to be evaluated, some may be new or changing and the patient may have concern these could be cancer.   The following portions of the chart were reviewed this encounter and updated as appropriate: medications, allergies, medical history  Review of Systems:  No other skin or systemic complaints except as noted in HPI or Assessment and Plan.  Objective  Well appearing patient in no apparent distress; mood and affect are within normal limits.   A focused examination was performed of the following areas: Right arm, shoulder, left leg  Relevant exam findings are noted in the Assessment and Plan.  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  Destruction of lesion  Destruction method: electrodesiccation and curettage   Informed consent: discussed and consent obtained   Timeout:  patient name, date of birth, surgical site, and procedure verified Anesthesia: the lesion was anesthetized in a standard fashion   Anesthetic:  1% lidocaine w/ epinephrine 1-100,000 buffered w/ 8.4% NaHCO3 Curettage performed in three different directions: Yes   Electrodesiccation performed over the curetted area: Yes   Curettage cycles:  3 Hemostasis achieved with:  electrodesiccation Outcome: patient tolerated procedure well with no complications   Post-procedure details: sterile dressing applied and wound care instructions given   Dressing type: petrolatum    Open  wound Left Lower Leg - Anterior  Patient advised legs take longer to heal, observe.   Actinic keratosis Right Upper Arm  Actinic keratoses are precancerous spots that appear secondary to cumulative UV radiation exposure/sun exposure over time. They are chronic with expected duration over 1 year. A portion of actinic keratoses will progress to squamous cell carcinoma of the skin. It is not possible to reliably predict which spots will progress to skin cancer and so treatment is recommended to prevent development of skin cancer.  Recommend daily broad spectrum sunscreen SPF 30+ to sun-exposed areas, reapply every 2 hours as needed.  Recommend staying in the shade or wearing long sleeves, sun glasses (UVA+UVB protection) and wide brim hats (4-inch brim around the entire circumference of the hat). Call for new or changing lesions.   Destruction of lesion - Right Upper Arm Complexity: simple   Destruction method: cryotherapy   Informed consent: discussed and consent obtained   Timeout:  patient name, date of birth, surgical site, and procedure verified Lesion destroyed using liquid nitrogen: Yes   Region frozen until ice ball extended beyond lesion: Yes   Cryo cycles: 1 or 2. Outcome: patient tolerated procedure well with no complications   Post-procedure details: wound care instructions given       Return for TBSE, with Dr. Katrinka Blazing, as scheduled.  Anise Salvo, RMA, am acting as scribe for Elie Goody, MD .   Documentation: I have reviewed the above documentation for accuracy and completeness, and I agree with the above.  Elie Goody, MD

## 2023-10-28 ENCOUNTER — Other Ambulatory Visit: Payer: Self-pay | Admitting: Physician Assistant

## 2023-10-28 DIAGNOSIS — G8929 Other chronic pain: Secondary | ICD-10-CM

## 2023-10-28 DIAGNOSIS — F411 Generalized anxiety disorder: Secondary | ICD-10-CM

## 2023-10-28 NOTE — Telephone Encounter (Signed)
Next 11/01/2023

## 2023-10-31 ENCOUNTER — Encounter: Payer: Self-pay | Admitting: Physician Assistant

## 2023-10-31 ENCOUNTER — Ambulatory Visit (INDEPENDENT_AMBULATORY_CARE_PROVIDER_SITE_OTHER): Payer: Medicare HMO | Admitting: Physician Assistant

## 2023-10-31 VITALS — BP 110/80 | HR 79 | Temp 98.2°F | Resp 16 | Ht 66.0 in | Wt 125.2 lb

## 2023-10-31 DIAGNOSIS — F411 Generalized anxiety disorder: Secondary | ICD-10-CM

## 2023-10-31 DIAGNOSIS — F5101 Primary insomnia: Secondary | ICD-10-CM | POA: Diagnosis not present

## 2023-10-31 DIAGNOSIS — K449 Diaphragmatic hernia without obstruction or gangrene: Secondary | ICD-10-CM

## 2023-10-31 DIAGNOSIS — R1032 Left lower quadrant pain: Secondary | ICD-10-CM

## 2023-10-31 DIAGNOSIS — I10 Essential (primary) hypertension: Secondary | ICD-10-CM | POA: Diagnosis not present

## 2023-10-31 DIAGNOSIS — G8929 Other chronic pain: Secondary | ICD-10-CM | POA: Diagnosis not present

## 2023-10-31 MED ORDER — ZOLPIDEM TARTRATE 10 MG PO TABS: ORAL_TABLET | ORAL | 1 refills | Status: DC
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
## 2023-11-14 ENCOUNTER — Ambulatory Visit: Payer: Medicare HMO | Admitting: Dermatology

## 2023-11-14 ENCOUNTER — Encounter: Payer: Self-pay | Admitting: Cardiology

## 2023-11-14 ENCOUNTER — Ambulatory Visit: Payer: Medicare HMO | Attending: Cardiology | Admitting: Cardiology

## 2023-11-14 ENCOUNTER — Encounter: Payer: Self-pay | Admitting: Dermatology

## 2023-11-14 VITALS — BP 122/78 | HR 71 | Ht 66.0 in | Wt 124.0 lb

## 2023-11-14 DIAGNOSIS — E854 Organ-limited amyloidosis: Secondary | ICD-10-CM

## 2023-11-14 DIAGNOSIS — D1801 Hemangioma of skin and subcutaneous tissue: Secondary | ICD-10-CM | POA: Diagnosis not present

## 2023-11-14 DIAGNOSIS — D492 Neoplasm of unspecified behavior of bone, soft tissue, and skin: Secondary | ICD-10-CM

## 2023-11-14 DIAGNOSIS — L814 Other melanin hyperpigmentation: Secondary | ICD-10-CM

## 2023-11-14 DIAGNOSIS — F17200 Nicotine dependence, unspecified, uncomplicated: Secondary | ICD-10-CM | POA: Diagnosis not present

## 2023-11-14 DIAGNOSIS — I10 Essential (primary) hypertension: Secondary | ICD-10-CM | POA: Diagnosis not present

## 2023-11-14 DIAGNOSIS — I739 Peripheral vascular disease, unspecified: Secondary | ICD-10-CM

## 2023-11-14 DIAGNOSIS — W908XXA Exposure to other nonionizing radiation, initial encounter: Secondary | ICD-10-CM

## 2023-11-14 DIAGNOSIS — E785 Hyperlipidemia, unspecified: Secondary | ICD-10-CM

## 2023-11-14 DIAGNOSIS — L821 Other seborrheic keratosis: Secondary | ICD-10-CM

## 2023-11-14 DIAGNOSIS — D229 Melanocytic nevi, unspecified: Secondary | ICD-10-CM

## 2023-11-14 DIAGNOSIS — L578 Other skin changes due to chronic exposure to nonionizing radiation: Secondary | ICD-10-CM

## 2023-11-14 DIAGNOSIS — L570 Actinic keratosis: Secondary | ICD-10-CM

## 2023-11-14 DIAGNOSIS — Z1283 Encounter for screening for malignant neoplasm of skin: Secondary | ICD-10-CM

## 2023-11-14 DIAGNOSIS — Z86007 Personal history of in-situ neoplasm of skin: Secondary | ICD-10-CM

## 2023-11-14 DIAGNOSIS — I2510 Atherosclerotic heart disease of native coronary artery without angina pectoris: Secondary | ICD-10-CM

## 2023-11-14 DIAGNOSIS — Z85828 Personal history of other malignant neoplasm of skin: Secondary | ICD-10-CM

## 2023-11-14 NOTE — Patient Instructions (Addendum)
 Cryotherapy Aftercare  Wash gently with soap and water everyday.   Apply Vaseline and Band-Aid daily until healed.   Wound Care Instructions  Cleanse wound gently with soap and water once a day then pat dry with clean gauze. Apply a thin coat of Petrolatum (petroleum jelly, Vaseline) over the wound (unless you have an allergy to this). We recommend that you use a new, sterile tube of Vaseline. Do not pick or remove scabs. Do not remove the yellow or white healing tissue from the base of the wound.  Cover the wound with fresh, clean, nonstick gauze and secure with paper tape. You may use Band-Aids in place of gauze and tape if the wound is small enough, but would recommend trimming much of the tape off as there is often too much. Sometimes Band-Aids can irritate the skin.  You should call the office for your biopsy report after 1 week if you have not already been contacted.  If you experience any problems, such as abnormal amounts of bleeding, swelling, significant bruising, significant pain, or evidence of infection, please call the office immediately.  FOR ADULT SURGERY PATIENTS: If you need something for pain relief you may take 1 extra strength Tylenol  (acetaminophen ) AND 2 Ibuprofen (200mg  each) together every 4 hours as needed for pain. (do not take these if you are allergic to them or if you have a reason you should not take them.) Typically, you may only need pain medication for 1 to 3 days.     Due to recent changes in healthcare laws, you may see results of your pathology and/or laboratory studies on MyChart before the doctors have had a chance to review them. We understand that in some cases there may be results that are confusing or concerning to you. Please understand that not all results are received at the same time and often the doctors may need to interpret multiple results in order to provide you with the best plan of care or course of treatment. Therefore, we ask that you  please give us  2 business days to thoroughly review all your results before contacting the office for clarification. Should we see a critical lab result, you will be contacted sooner.   If You Need Anything After Your Visit  If you have any questions or concerns for your doctor, please call our main line at [phone] and press option 4 to reach your doctor's medical assistant. If no one answers, please leave a voicemail as directed and we will return your call as soon as possible. Messages left after 4 pm will be answered the following business day.   You may also send us  a message via MyChart. We typically respond to MyChart messages within 1-2 business days.  For prescription refills, please ask your pharmacy to contact our office. Our fax number is [phone].  If you have an urgent issue when the clinic is closed that cannot wait until the next business day, you can page your doctor at the number below.    Please note that while we do our best to be available for urgent issues outside of office hours, we are not available 24/7.   If you have an urgent issue and are unable to reach us , you may choose to seek medical care at your doctor's office, retail clinic, urgent care center, or emergency room.  If you have a medical emergency, please immediately call 911 or go to the emergency department.  Pager Numbers  - Dr. Hester: [phone]  -  Dr. Jackquline: [phone]  - Dr. Claudene: [phone]   In the event of inclement weather, please call our main line at [phone] for an update on the status of any delays or closures.  Dermatology Medication Tips: Please keep the boxes that topical medications come in in order to help keep track of the instructions about where and how to use these. Pharmacies typically print the medication instructions only on the boxes and not directly on the medication tubes.   If your medication is too expensive, please contact our office at  [phone] option 4 or send us  a message through MyChart.   We are unable to tell what your co-pay for medications will be in advance as this is different depending on your insurance coverage. However, we may be able to find a substitute medication at lower cost or fill out paperwork to get insurance to cover a needed medication.   If a prior authorization is required to get your medication covered by your insurance company, please allow us  1-2 business days to complete this process.  Drug prices often vary depending on where the prescription is filled and some pharmacies may offer cheaper prices.  The website www.goodrx.com contains coupons for medications through different pharmacies. The prices here do not account for what the cost may be with help from insurance (it may be cheaper with your insurance), but the website can give you the price if you did not use any insurance.  - You can print the associated coupon and take it with your prescription to the pharmacy.  - You may also stop by our office during regular business hours and pick up a GoodRx coupon card.  - If you need your prescription sent electronically to a different pharmacy, notify our office through [facility] or by phone at [phone] option 4.     Si Usted Necesita Algo Despus de Su Visita  Tambin puede enviarnos un mensaje a travs de Clinical cytogeneticist. Por lo general respondemos a los mensajes de MyChart en el transcurso de 1 a 2 das hbiles.  Para renovar recetas, por favor pida a su farmacia que se ponga en contacto con nuestra oficina. Randi lakes de fax es [place] [phone].  Si tiene un asunto urgente cuando la clnica est cerrada y que no puede esperar hasta el siguiente da hbil, puede llamar/localizar a su doctor(a) al nmero que aparece a continuacin.   Por favor, tenga en cuenta que aunque hacemos todo lo posible para estar disponibles para asuntos urgentes fuera del horario de [place], no estamos  disponibles las 24 horas del da, los 7 [address]  [address] de la [place].   Si tiene un problema urgente y no puede comunicarse con nosotros, puede optar por buscar atencin mdica  en el consultorio de su doctor(a), en una clnica privada, en un centro de atencin urgente o en una sala de emergencias.  Si tiene Engineer, drilling, por favor llame inmediatamente al 911 o vaya a la sala de emergencias.  Nmeros de bper  - Dr. Hester: [phone]  - Dra. Jackquline: [phone]  - Dr. Claudene: [phone]   En caso de inclemencias del tiempo, por favor llame a landry capes principal al [phone] para una actualizacin sobre el [place] de cualquier retraso o cierre.  Consejos para la medicacin en dermatologa: Por favor, guarde las cajas en las que vienen los medicamentos de uso tpico para ayudarle a seguir las instrucciones sobre dnde y cmo usarlos. Las farmacias generalmente imprimen las instrucciones del medicamento slo en las  cajas y no directamente en los tubos del medicamento.   Si su medicamento es muy caro, por favor, pngase en contacto con landry rieger llamando al [phone] y presione la opcin 4 o envenos un mensaje a travs de Clinical cytogeneticist.   No podemos decirle cul ser su copago por los medicamentos por adelantado ya que esto es diferente dependiendo de la cobertura de su seguro. Sin embargo, es posible que podamos encontrar un medicamento sustituto a Audiological scientist un formulario para que el seguro cubra el medicamento que se considera necesario.   Si se requiere una autorizacin previa para que su compaa de seguros malta su medicamento, por favor permtanos de 1 a 2 das hbiles para completar este proceso.  Los precios de los medicamentos varan con frecuencia dependiendo del Environmental consultant de dnde se surte la receta y alguna farmacias pueden ofrecer precios ms baratos.  El sitio web www.goodrx.com tiene cupones para medicamentos de Health and safety inspector. Los precios aqu no  tienen en cuenta lo que podra costar con la ayuda del seguro (puede ser ms barato con su seguro), pero el sitio web puede darle el precio si no utiliz Tourist information centre manager.  - Puede imprimir el cupn correspondiente y llevarlo con su receta a la farmacia.  - Tambin puede pasar por nuestra oficina durante el horario de atencin regular y Education officer, museum una tarjeta de cupones de GoodRx.  - Si necesita que su receta se enve electrnicamente a una farmacia diferente, informe a nuestra oficina a travs de MyChart de [place] o por telfono llamando al [phone] y presione la opcin 4.

## 2023-11-14 NOTE — Progress Notes (Signed)
 Follow-Up Visit   Subjective  Joy Carpenter is a 73 y.o. female who presents for the following: Skin Cancer Screening and Full Body Skin Exam hx of SCC, SCC IS, Aks, check mole R upper abdomen  The patient presents for Total-Body Skin Exam (TBSE) for skin cancer screening and mole check. The patient has spots, moles and lesions to be evaluated, some may be new or changing and the patient may have concern these could be cancer.    The following portions of the chart were reviewed this encounter and updated as appropriate: medications, allergies, medical history  Review of Systems:  No other skin or systemic complaints except as noted in HPI or Assessment and Plan.  Objective  Well appearing patient in no apparent distress; mood and affect are within normal limits.  A full examination was performed including scalp, head, eyes, ears, nose, lips, neck, chest, axillae, abdomen, back, buttocks, bilateral upper extremities, bilateral lower extremities, hands, feet, fingers, toes, fingernails, and toenails. All findings within normal limits unless otherwise noted below.   Relevant physical exam findings are noted in the Assessment and Plan.  L lat forehead x 3, L cheek x 2, R cheek x 5, R nasal bridge x 1, (11) Pink scaly macules L upper cutaneous lip 7.30mm pigmented macule  Right Upper Arm 7.26mm star like macule with pigment  L lower medial leg 8.19mm multiple colored brown thin pap   Assessment & Plan   SKIN CANCER SCREENING PERFORMED TODAY.  ACTINIC DAMAGE - Chronic condition, secondary to cumulative UV/sun exposure - diffuse scaly erythematous macules with underlying dyspigmentation - Recommend daily broad spectrum sunscreen SPF 30+ to sun-exposed areas, reapply every 2 hours as needed.  - Staying in the shade or wearing long sleeves, sun glasses (UVA+UVB protection) and wide brim hats (4-inch brim around the entire circumference of the hat) are also recommended for  sun protection.  - Call for new or changing lesions.  LENTIGINES, SEBORRHEIC KERATOSES, HEMANGIOMAS - Benign normal skin lesions - Benign-appearing - Call for any changes - SK R upper abdomen  MELANOCYTIC NEVI - Tan-brown and/or pink-flesh-colored symmetric macules and papules - Benign appearing on exam today - Observation - Call clinic for new or changing moles - Recommend daily use of broad spectrum spf 30+ sunscreen to sun-exposed areas.   HISTORY OF SQUAMOUS CELL CARCINOMA OF THE SKIN - No evidence of recurrence today - No lymphadenopathy - Recommend regular full body skin exams - Recommend daily broad spectrum sunscreen SPF 30+ to sun-exposed areas, reapply every 2 hours as needed.  - Call if any new or changing lesions are noted between office visits - L dorsal hand   HISTORY OF SQUAMOUS CELL CARCINOMA IN SITU OF THE SKIN - No evidence of recurrence today - Recommend regular full body skin exams - Recommend daily broad spectrum sunscreen SPF 30+ to sun-exposed areas, reapply every 2 hours as needed.  - Call if any new or changing lesions are noted between office visits  - L ant shoulder, discussed IL Kenalog  if scar is irritating/symptomatic  AK (ACTINIC KERATOSIS) (11) L lat forehead x 3, L cheek x 2, R cheek x 5, R nasal bridge x 1, (11) Actinic keratoses are precancerous spots that appear secondary to cumulative UV radiation exposure/sun exposure over time. They are chronic with expected duration over 1 year. A portion of actinic keratoses will progress to squamous cell carcinoma of the skin. It is not possible to reliably predict which spots will progress to  skin cancer and so treatment is recommended to prevent development of skin cancer.  Recommend daily broad spectrum sunscreen SPF 30+ to sun-exposed areas, reapply every 2 hours as needed.  Recommend staying in the shade or wearing long sleeves, sun glasses (UVA+UVB protection) and wide brim hats (4-inch brim around  the entire circumference of the hat). Call for new or changing lesions. Destruction of lesion - L lat forehead x 3, L cheek x 2, R cheek x 5, R nasal bridge x 1, (11) Complexity: simple   Destruction method: cryotherapy   Informed consent: discussed and consent obtained   Timeout:  patient name, date of birth, surgical site, and procedure verified Lesion destroyed using liquid nitrogen: Yes   Region frozen until ice ball extended beyond lesion: Yes   Cryo cycles: 1 or 2. Outcome: patient tolerated procedure well with no complications   Post-procedure details: wound care instructions given   NEOPLASM OF SKIN (3) L upper cutaneous lip Skin / nail biopsy Type of biopsy: tangential   Informed consent: discussed and consent obtained   Timeout: patient name, date of birth, surgical site, and procedure verified   Procedure prep:  Patient was prepped and draped in usual sterile fashion Prep type:  Isopropyl alcohol Anesthesia: the lesion was anesthetized in a standard fashion   Anesthetic:  1% lidocaine  w/ epinephrine  1-100,000 buffered w/ 8.4% NaHCO3 Instrument used: DermaBlade   Hemostasis achieved with: pressure and aluminum chloride   Outcome: patient tolerated procedure well   Post-procedure details: sterile dressing applied and wound care instructions given   Dressing type: bandage and bacitracin   Specimen 1 - Surgical pathology Differential Diagnosis: R/O Melanoma  Check Margins: No 7.32mm pigmented macule Right Upper Arm Skin / nail biopsy Type of biopsy: tangential   Informed consent: discussed and consent obtained   Timeout: patient name, date of birth, surgical site, and procedure verified   Procedure prep:  Patient was prepped and draped in usual sterile fashion Prep type:  Isopropyl alcohol Anesthesia: the lesion was anesthetized in a standard fashion   Anesthetic:  1% lidocaine  w/ epinephrine  1-100,000 buffered w/ 8.4% NaHCO3 Instrument used: DermaBlade   Hemostasis  achieved with: pressure and aluminum chloride   Outcome: patient tolerated procedure well   Post-procedure details: sterile dressing applied and wound care instructions given   Dressing type: bandage and petrolatum    Skin / nail biopsy Type of biopsy: tangential   Informed consent: discussed and consent obtained   Timeout: patient name, date of birth, surgical site, and procedure verified   Procedure prep:  Patient was prepped and draped in usual sterile fashion Prep type:  Isopropyl alcohol Anesthesia: the lesion was anesthetized in a standard fashion   Anesthetic:  1% lidocaine  w/ epinephrine  1-100,000 buffered w/ 8.4% NaHCO3 Instrument used: DermaBlade   Hemostasis achieved with: pressure and aluminum chloride   Outcome: patient tolerated procedure well   Post-procedure details: sterile dressing applied and wound care instructions given   Dressing type: bandage and bacitracin   Specimen 2 - Surgical pathology Differential Diagnosis: R/O Melanoma  Check Margins: No 7.74mm star like macule with pigment L lower medial leg Skin / nail biopsy Type of biopsy: tangential   Informed consent: discussed and consent obtained   Timeout: patient name, date of birth, surgical site, and procedure verified   Procedure prep:  Patient was prepped and draped in usual sterile fashion Prep type:  Isopropyl alcohol Anesthesia: the lesion was anesthetized in a standard fashion   Anesthetic:  1% lidocaine  w/ epinephrine  1-100,000 buffered w/ 8.4% NaHCO3 Instrument used: DermaBlade   Hemostasis achieved with: pressure and aluminum chloride   Outcome: patient tolerated procedure well   Post-procedure details: sterile dressing applied and wound care instructions given   Dressing type: bandage and bacitracin   Specimen 3 - Surgical pathology Differential Diagnosis: R/O Melanoma  Check Margins: No 8.94mm multiple colored brown thin pap LENTIGINES   MULTIPLE BENIGN NEVI   ACTINIC  ELASTOSIS   SEBORRHEIC KERATOSES   CHERRY ANGIOMA      Return in about 6 months (around 05/13/2024) for TBSE, Hx of SCC, Hx of SCC IS, Hx of AKs.  I, Grayce Saunas, RMA, am acting as scribe for Boneta Sharps, MD .   Documentation: I have reviewed the above documentation for accuracy and completeness, and I agree with the above.  Boneta Sharps, MD

## 2023-11-14 NOTE — Progress Notes (Signed)
 Cardiology Office Note:  .   Date:  11/14/2023  ID:  Joy Carpenter, DOB [birth year], MRN [id number] PCP: Kristina Tinnie MARLA DEVONNA  [facility] Providers Cardiologist:  Redell Cave, MD    History of Present Illness: .   Joy Carpenter is a 73 y.o. 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  She had complaints of chest pain that been ongoing for approximately a year that seem to worsen in February 2024.  Symptoms were not associated with exertion or rest.  Her symptoms were pain radiating down her left arm.  She had an aneurysm status post endograft repair at [facility] in 2022 and follows with [facility] yearly for monitoring.  She feels stressed due to family situations and she was scheduled for an echocardiogram and a Lexiscan  Myoview  at her last clinic appointment 11/28/2022.  She return to clinic in 01/08/2023 overall doing fairly well.  She did follow-up with nephrology.  Recently started on losartan  25 mg daily.  She occasionally was experiencing muscle cramps in the lower extremities and continued atypical chest and abdominal pressure with unrevealing studies.  She was last seen in clinic 05/14/2019 for which she was fairly well from a cardiac standpoint.  She continued to have chest pressure that started in the lower abdomen and radiated to her chest was unchanged in intensity and frequency.  She also had stabbing pain to the left side.  She recently had follow-up with vascular and was advised that was not related to the stent that she had placed.  She was concerned that that she had a moderately sized hiatal hernia that needed surgery.  There were no medication changes that were made and no further testing that was ordered at that time.  She returns to  clinic today stating that overall she has been doing okay.  She continues to have abdominal pain that radiates up into her chest that is unchanged in frequency and intensity as she has had for over the last 2 years.  She continues to have occasional radiation up into her chest and into her left side and arm.  She is contemplating following up with general surgery about having her hiatal hernia taking care of.  She has been under an increased amount of stress lately as her boyfriend just recently passed away and she was primary caregiver where he came home on hospice.  She also recently had issues with her car because she did not dear and has been under financial strain having to pay for rental car and working with insurance to get her car fixed.  She states that she has been compliant with her current medication regimen and no side effects.  Denies any hospitalizations or visits to the emergency department.  ROS: 10 point review of systems has been reviewed and considered negative except what is been listed in the HPI  Studies Reviewed: SABRA   EKG Interpretation Date/Time:  Thursday November 14 2023 09:10:36 EST Ventricular Rate:  71 PR Interval:  192 QRS Duration:  72 QT Interval:  398 QTC Calculation: 432 R Axis:   -8  Text Interpretation: Normal sinus rhythm Normal ECG When compared with ECG of 17-Jan-2022 11:05, No significant change was found Confirmed by Gerard Frederick ([id number]) on 11/14/2023 9:16:36 AM    TTE 12/25/22  1.  Left ventricular ejection fraction, by estimation, is 60 to 65%. The  left ventricle has normal function. The left ventricle has no regional  wall motion abnormalities. Left ventricular diastolic parameters are  consistent with Grade I diastolic  dysfunction (impaired relaxation). The average left ventricular global  longitudinal strain is -18.7 %.   2. Right ventricular systolic function is normal. The right ventricular  size is normal. Tricuspid regurgitation signal is  inadequate for assessing  PA pressure.   3. The mitral valve is normal in structure. Mild mitral valve  regurgitation. No evidence of mitral stenosis.   4. The aortic valve is tricuspid. Aortic valve regurgitation is not  visualized. Aortic valve sclerosis/calcification is present, without any  evidence of aortic stenosis.   5. The inferior vena cava is normal in size with greater than 50%  respiratory variability, suggesting right atrial pressure of 3 mmHg.    Lexiscan  Myoview  12/05/22 LV perfusion was normal Left ventricular function was normal.  Nuclear stress EF 76%.  End-diastolic cavity size is normal. Findings are consistent with no ischemia.  The study is low risk Risk Assessment/Calculations:             Physical Exam:   VS:  BP 122/78 (BP Location: Left Arm, Patient Position: Sitting, Cuff Size: Normal)   Pulse 71   Ht 5' 6 (1.676 m)   Wt 124 lb (56.2 kg)   SpO2 98%   BMI 20.01 kg/m    Wt Readings from Last 3 Encounters:  11/14/23 124 lb (56.2 kg)  10/31/23 125 lb 3.2 oz (56.8 kg)  08/01/23 125 lb 9.6 oz (57 kg)    GEN: Well nourished, well developed in no acute distress NECK: No JVD; No carotid bruits CARDIAC: RRR, no murmurs, rubs, gallops RESPIRATORY:  Clear to auscultation without rales, wheezing or rhonchi  ABDOMEN: Soft, non-tender, non-distended EXTREMITIES:  No edema; No deformity   ASSESSMENT AND PLAN: .   Coronary artery disease involving native coronary artery of LAD and RCA noted on chest CT.  Ischemic workup is continued to be negative.  Symptoms have been changed for greater than 2 years.  EKG today reveals sinus rhythm with a rate of 71 with no significant change from prior studies.  No further ischemic testing needed at this time.  She is continued on aspirin  81 mg daily and 40 mg daily.  Primary hypertension with blood pressure 120/98 with a recheck of 122/78.  Pressures remain stable.  She has been encouraged to continue to monitor pressure  controlled with medication regimen.  Mixed hyperlipidemia with most recent LDL 73.  She is continued 40 minutes daily.  She has upcoming labs with PCP.  Peripheral arterial disease with previous surgery by vascular surgery for abdominal aortic aneurysm with endograft in 2022.  She continues to follow with vascular at [facility].  Tobacco abuse with total cessation being recommended.       Dispo: Patient return to clinic to see MD/APP in 6 months or sooner if needed for reevaluation of symptoms.  Signed, Lenola Lockner, NP

## 2023-11-14 NOTE — Patient Instructions (Signed)
Medication Instructions:  No changes at this time.   *If you need a refill on your cardiac medications before your next appointment, please call your pharmacy*   Lab Work: None  If you have labs (blood work) drawn today and your tests are completely normal, you will receive your results only by: MyChart Message (if you have MyChart) OR A paper copy in the mail If you have any lab test that is abnormal or we need to change your treatment, we will call you to review the results.   Testing/Procedures: None   Follow-Up: At [facility], you and your health needs are our priority.  As part of our continuing mission to provide you with exceptional heart care, we have created designated Provider Care Teams.  These Care Teams include your primary Cardiologist (physician) and Advanced Practice Providers (APPs -  Physician Assistants and Nurse Practitioners) who all work together to provide you with the care you need, when you need it.   Your next appointment:   6 month(s)  Provider:   Brian Agbor-Etang, MD or Sheri Hammock, NP     

## 2023-11-20 LAB — SURGICAL PATHOLOGY

## 2023-11-22 ENCOUNTER — Encounter: Payer: Self-pay | Admitting: Dermatology

## 2023-11-24 ENCOUNTER — Other Ambulatory Visit: Payer: Self-pay | Admitting: Physician Assistant

## 2023-11-24 DIAGNOSIS — F411 Generalized anxiety disorder: Secondary | ICD-10-CM

## 2023-11-24 DIAGNOSIS — G8929 Other chronic pain: Secondary | ICD-10-CM

## 2023-11-25 NOTE — Telephone Encounter (Signed)
Last 10/31/23 and next 4/25

## 2023-11-29 ENCOUNTER — Emergency Department
Admission: EM | Admit: 2023-11-29 | Discharge: 2023-11-29 | Disposition: A | Discharge status: Home / Self Care | Payer: Medicare HMO | Attending: Emergency Medicine | Admitting: Emergency Medicine

## 2023-11-29 ENCOUNTER — Encounter: Payer: Self-pay | Admitting: Emergency Medicine

## 2023-11-29 ENCOUNTER — Other Ambulatory Visit: Payer: Self-pay

## 2023-11-29 ENCOUNTER — Emergency Department: Payer: Medicare HMO

## 2023-11-29 DIAGNOSIS — I714 Abdominal aortic aneurysm, without rupture, unspecified: Secondary | ICD-10-CM | POA: Insufficient documentation

## 2023-11-29 DIAGNOSIS — I7122 Aneurysm of the aortic arch, without rupture: Secondary | ICD-10-CM | POA: Insufficient documentation

## 2023-11-29 DIAGNOSIS — K449 Diaphragmatic hernia without obstruction or gangrene: Secondary | ICD-10-CM | POA: Diagnosis not present

## 2023-11-29 DIAGNOSIS — J439 Emphysema, unspecified: Secondary | ICD-10-CM | POA: Diagnosis not present

## 2023-11-29 DIAGNOSIS — M546 Pain in thoracic spine: Secondary | ICD-10-CM | POA: Diagnosis not present

## 2023-11-29 DIAGNOSIS — R059 Cough, unspecified: Secondary | ICD-10-CM | POA: Diagnosis not present

## 2023-11-29 DIAGNOSIS — F17200 Nicotine dependence, unspecified, uncomplicated: Secondary | ICD-10-CM | POA: Diagnosis not present

## 2023-11-29 DIAGNOSIS — R918 Other nonspecific abnormal finding of lung field: Secondary | ICD-10-CM | POA: Diagnosis not present

## 2023-11-29 DIAGNOSIS — J432 Centrilobular emphysema: Secondary | ICD-10-CM | POA: Diagnosis not present

## 2023-11-29 DIAGNOSIS — I700 Atherosclerosis of aorta: Secondary | ICD-10-CM | POA: Insufficient documentation

## 2023-11-29 DIAGNOSIS — R0789 Other chest pain: Secondary | ICD-10-CM | POA: Diagnosis not present

## 2023-11-29 DIAGNOSIS — I2510 Atherosclerotic heart disease of native coronary artery without angina pectoris: Secondary | ICD-10-CM | POA: Insufficient documentation

## 2023-11-29 DIAGNOSIS — R079 Chest pain, unspecified: Secondary | ICD-10-CM | POA: Diagnosis not present

## 2023-11-29 DIAGNOSIS — R0781 Pleurodynia: Secondary | ICD-10-CM | POA: Diagnosis not present

## 2023-11-29 DIAGNOSIS — Z95828 Presence of other vascular implants and grafts: Secondary | ICD-10-CM | POA: Diagnosis not present

## 2023-11-29 DIAGNOSIS — M549 Dorsalgia, unspecified: Secondary | ICD-10-CM

## 2023-11-29 LAB — COMPREHENSIVE METABOLIC PANEL
ALT: 20 U/L (ref 0–44)
AST: 21 U/L (ref 15–41)
Albumin: 3.6 g/dL (ref 3.5–5.0)
Alkaline Phosphatase: 106 U/L (ref 38–126)
Anion gap: 11 (ref 5–15)
BUN: 26 mg/dL — ABNORMAL HIGH (ref 8–23)
CO2: 20 mmol/L — ABNORMAL LOW (ref 22–32)
Calcium: 8.5 mg/dL — ABNORMAL LOW (ref 8.9–10.3)
Chloride: 105 mmol/L (ref 98–111)
Creatinine, Ser: 1.14 mg/dL — ABNORMAL HIGH (ref 0.44–1.00)
GFR, Estimated: 52 mL/min — ABNORMAL LOW (ref >60)
Glucose, Bld: 93 mg/dL (ref 70–99)
Potassium: 4 mmol/L (ref 3.5–5.1)
Sodium: 136 mmol/L (ref 135–145)
Total Bilirubin: 0.2 mg/dL (ref 0.0–1.2)
Total Protein: 6.8 g/dL (ref 6.5–8.1)

## 2023-11-29 LAB — CBC WITH DIFFERENTIAL/PLATELET
Abs Immature Granulocytes: 0.01 10*3/uL (ref 0.00–0.07)
Basophils Absolute: 0.1 10*3/uL (ref 0.0–0.1)
Basophils Relative: 1 %
Eosinophils Absolute: 0.1 10*3/uL (ref 0.0–0.5)
Eosinophils Relative: 2 %
HCT: 39 % (ref 36.0–46.0)
Hemoglobin: 13 g/dL (ref 12.0–15.0)
Immature Granulocytes: 0 %
Lymphocytes Relative: 22 %
Lymphs Abs: 1.3 10*3/uL (ref 0.7–4.0)
MCH: 32.3 pg (ref 26.0–34.0)
MCHC: 33.3 g/dL (ref 30.0–36.0)
MCV: 97 fL (ref 80.0–100.0)
Monocytes Absolute: 0.6 10*3/uL (ref 0.1–1.0)
Monocytes Relative: 11 %
Neutro Abs: 3.8 10*3/uL (ref 1.7–7.7)
Neutrophils Relative %: 64 %
Platelets: 132 10*3/uL — ABNORMAL LOW (ref 150–400)
RBC: 4.02 MIL/uL (ref 3.87–5.11)
RDW: 12.7 % (ref 11.5–15.5)
WBC: 6 10*3/uL (ref 4.0–10.5)
nRBC: 0 % (ref 0.0–0.2)

## 2023-11-29 LAB — RESP PANEL BY RT-PCR (RSV, FLU A&B, COVID)  RVPGX2
Influenza A by PCR: NEGATIVE
Influenza B by PCR: NEGATIVE
Resp Syncytial Virus by PCR: NEGATIVE
SARS Coronavirus 2 by RT PCR: NEGATIVE

## 2023-11-29 LAB — TROPONIN I (HIGH SENSITIVITY): Troponin I (High Sensitivity): 3 ng/L (ref <18)

## 2023-11-29 LAB — LIPASE, BLOOD: Lipase: 21 U/L (ref 11–51)

## 2023-11-29 MED ORDER — GUAIFENESIN-CODEINE 100-10 MG/5ML PO SOLN: 5.0000 mL | Freq: Every evening | ORAL | 0 refills | Status: AC | PRN

## 2023-11-29 MED ORDER — BENZONATATE 100 MG PO CAPS: 100.0000 mg | ORAL_CAPSULE | Freq: Three times a day (TID) | ORAL | 0 refills | Status: AC | PRN

## 2023-11-29 MED ORDER — IOHEXOL 350 MG/ML SOLN
100.0000 mL | Freq: Once | INTRAVENOUS | Status: AC | PRN
  Administered 2023-11-29: 100 mL via INTRAVENOUS

## 2023-11-29 MED ORDER — PREDNISONE 20 MG PO TABS: 40.0000 mg | ORAL_TABLET | Freq: Every day | ORAL | 0 refills | Status: AC

## 2023-11-29 MED ORDER — IOHEXOL 300 MG/ML  SOLN: 100.0000 mL | Freq: Once | INTRAMUSCULAR | Status: DC | PRN

## 2023-11-29 MED ORDER — LIDOCAINE 5 % EX PTCH
1.0000 | MEDICATED_PATCH | CUTANEOUS | Status: DC
  Administered 2023-11-29: 1 via TRANSDERMAL
  Filled 2023-11-29: qty 1

## 2023-11-29 MED ORDER — ACETAMINOPHEN 500 MG PO TABS
1000.0000 mg | ORAL_TABLET | Freq: Once | ORAL | Status: AC
  Administered 2023-11-29: 1000 mg via ORAL
  Filled 2023-11-29: qty 2

## 2023-11-29 MED ORDER — LIDOCAINE 5 % EX PTCH: 1.0000 | MEDICATED_PATCH | Freq: Two times a day (BID) | CUTANEOUS | 0 refills | Status: AC

## 2023-11-29 NOTE — Discharge Instructions (Addendum)
You can take Tylenol 1 g every 8 hours for 1 week with the lidocaine patches.  Use the Tessalon Perles to help with cough and the codeine cough syrup at nighttime.  Be careful combining this with your benzos as this can cause increased sedation.  Return to the ER for worsening symptoms or any other concerns  IMPRESSION: 1. 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 No evidence of endoleak. Maximum caliber of the unstented proximal descending thoracic aorta 3.4 x 3.4 cm. 2. Fusiform aneurysm of the abdominal aorta, contiguous with the descending thoracic aorta, status post overlapping aortobiiliac stent endograft repair, with snorkel stenting of the aortic branch vessels. 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 The superior mesenteric and bilateral renal artery stents are patent. No evidence of endoleak. 3. Emphysema and diffuse bilateral bronchial wall thickening. 4. Large hiatal hernia with intrathoracic position of the gastric body and fundus. 5. Coronary artery disease.   Aortic Atherosclerosis (ICD10-I70.0) and Emphysema (ICD10-J43.9).

## 2023-11-29 NOTE — ED Notes (Signed)
See triage note  Presents with pain to right lateral rib area Denies any recent injury   States she was on the couch  coughed and developed pain Area is tender to touch   also   Afebrile on arrival

## 2023-11-29 NOTE — ED Triage Notes (Addendum)
Pt arrived via POV from [facility] with reports of R side lower rib cage pain since Wednesday, pt states she is a smoker, states she has had cough and scratchy throat, states painful to rib area when taking in a deep breath.   R rib cage tender to palpation in triage.

## 2023-11-29 NOTE — ED Provider Notes (Addendum)
[facility] Provider Note    Event Date/Time   First MD Initiated Contact with Patient 11/29/23 1031     (approximate)   History   Cough and Rib Cage Pain   HPI  Joy Carpenter is a 73 y.o. female with history of thoracic aneurysm repair who comes in with right upper back pain.  Patient reports having a cough for the past few days and then developing pain on this right side of her back.  She reports the pain is worse with coughing and worse with certain movements.  She denies any overt chest pain, shortness of breath, abdominal pain otherwise.  No symptoms of cord compression.   Physical Exam   Triage Vital Signs: ED Triage Vitals  Encounter Vitals Group     BP 11/29/23 1018 109/68     Systolic BP Percentile --      Diastolic BP Percentile --      Pulse Rate 11/29/23 1018 69     Resp 11/29/23 1018 18     Temp 11/29/23 1018 98.4 F (36.9 C)     Temp Source 11/29/23 1018 Oral     SpO2 11/29/23 1018 99 %     Weight 11/29/23 1017 124 lb (56.2 kg)     Height 11/29/23 1017 5\' 6"  (1.676 m)     Head Circumference --      Peak Flow --      Pain Score 11/29/23 1015 8     Pain Loc --      Pain Education --      Exclude from Growth Chart --     Most recent vital signs: Vitals:   11/29/23 1018  BP: 109/68  Pulse: 69  Resp: 18  Temp: 98.4 F (36.9 C)  SpO2: 99%     General: Awake, no distress.  CV:  Good peripheral perfusion.  Resp:  Normal effort.  Abd:  No distention.  Other:  Upper right back tenderness worse with certain movements.  Abdomen otherwise soft and nontender. No rash noted    ED Results / Procedures / Treatments   Labs (all labs ordered are listed, but only abnormal results are displayed) Labs Reviewed  CBC WITH DIFFERENTIAL/PLATELET - Abnormal; Notable for the following components:      Result Value   Platelets 132 (*)    All other components within normal limits  COMPREHENSIVE METABOLIC PANEL - Abnormal;  Notable for the following components:   CO2 20 (*)    BUN 26 (*)    Creatinine, Ser 1.14 (*)    Calcium 8.5 (*)    GFR, Estimated 52 (*)    All other components within normal limits  RESP PANEL BY RT-PCR (RSV, FLU A&B, COVID)  RVPGX2  LIPASE, BLOOD  TROPONIN I (HIGH SENSITIVITY)  TROPONIN I (HIGH SENSITIVITY)     EKG  My interpretation of EKG:  Normal sinus rate 64 without any ST elevation or T wave inversions, normal intervals  RADIOLOGY I have reviewed the xray personally and interpreted and graft noted in the aorta   PROCEDURES:  Critical Care performed: No  Procedures   MEDICATIONS ORDERED IN ED: Medications  lidocaine (LIDODERM) 5 % 1 patch (1 patch Transdermal Patch Applied 11/29/23 1140)  acetaminophen (TYLENOL) tablet 1,000 mg (1,000 mg Oral Given 11/29/23 1138)  iohexol (OMNIPAQUE) 350 MG/ML injection 100 mL (100 mLs Intravenous Contrast Given 11/29/23 1229)     IMPRESSION / MDM / ASSESSMENT AND PLAN / ED COURSE  I reviewed the triage vital signs and the nursing notes.   Patient's presentation is most consistent with acute presentation with potential threat to life or bodily function.   Patient comes in with pain on her right upper back.  She is not really tender over her gallbladder but given her history of aneurysm we did discuss CT imaging to ensure stable aneurysm.  However I suspect that the more likely related to the coughing and some pain related to the muscle given she reports worse with movement.  Lipase normal CBC reassuring CMP shows normal LFTs no signs of choledocholithiasis.  Troponin was negative COVID, flu are negative.  Consider repeat cardiac marker but pain has been going on for 2 days and has been constant therefore no indication for repeat.   Patient reports minimal pain with laying still but when she coughs or she moves she reports increasing pain.  Discussed most likely musculoskeletal.  CT scan as below.  I reviewed her prior CT scan  from [facility] where she has had the celiac artery stent that is occluded.  We also discussed her large hiatal hernia and she expressed understanding and stated that she was post to have surgery on that but has not been able to follow-up yet.  She does have some coronary artery disease noted therefore I will refer patient to cardiology but at this time her pain seems to be more musculoskeletal in nature.  IMPRESSION: 1. 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 No evidence of endoleak. Maximum caliber of the unstented proximal descending thoracic aorta 3.4 x 3.4 cm. 2. Fusiform aneurysm of the abdominal aorta, contiguous with the descending thoracic aorta, status post overlapping aortobiiliac stent endograft repair, with snorkel stenting of the aortic branch vessels. 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 The superior mesenteric and bilateral renal artery stents are patent. No evidence of endoleak. 3. Emphysema and diffuse bilateral bronchial wall thickening. 4. Large hiatal hernia with intrathoracic position of the gastric body and fundus. 5. Coronary artery disease.    Patient reports that she has a history of coronary disease and follow-up with cardiology recently and denies any chest pain.  I discussed that she is already on Xanax, Ambien and the wrist with codeine cough syrup but patient would like to proceed with it at nighttime she expressed understanding of the dangers of combining these but states that she only uses a half of the Ambien at nighttime and does not only use the Xanax in the morning.  She understands not to combine these at the same time.  No wheezing on examination but given emphysema will trial some steroids to see if that helps with coughing as well.   FINAL CLINICAL IMPRESSION(S) / ED DIAGNOSES   Final  diagnoses:  Upper back pain  Coronary artery disease involving native heart without angina pectoris, unspecified vessel or lesion type     Rx / DC Orders   ED Discharge Orders          Ordered    Ambulatory referral to Cardiology        11/29/23 1327    lidocaine (LIDODERM) 5 %  Every 12 hours        11/29/23 1332    guaiFENesin-codeine 100-10 MG/5ML syrup  At bedtime PRN        11/29/23 1332    benzonatate (TESSALON PERLES) 100 MG capsule  3 times daily PRN        11/29/23 1332             Note:  This document was prepared using Dragon voice recognition software and may include unintentional dictation errors.   Concha Se, MD 11/29/23 1332    Concha Se, MD 11/29/23 [phone]

## 2023-12-05 ENCOUNTER — Ambulatory Visit (INDEPENDENT_AMBULATORY_CARE_PROVIDER_SITE_OTHER): Payer: Medicare HMO | Admitting: Physician Assistant

## 2023-12-05 ENCOUNTER — Telehealth: Payer: Self-pay | Admitting: Physician Assistant

## 2023-12-05 VITALS — BP 130/80 | HR 74 | Temp 97.8°F | Resp 16 | Ht 66.0 in | Wt 127.0 lb

## 2023-12-05 DIAGNOSIS — G8929 Other chronic pain: Secondary | ICD-10-CM

## 2023-12-05 DIAGNOSIS — R198 Other specified symptoms and signs involving the digestive system and abdomen: Secondary | ICD-10-CM

## 2023-12-05 DIAGNOSIS — R109 Unspecified abdominal pain: Secondary | ICD-10-CM

## 2023-12-05 DIAGNOSIS — K449 Diaphragmatic hernia without obstruction or gangrene: Secondary | ICD-10-CM

## 2023-12-05 DIAGNOSIS — R195 Other fecal abnormalities: Secondary | ICD-10-CM | POA: Diagnosis not present

## 2023-12-05 DIAGNOSIS — N390 Urinary tract infection, site not specified: Secondary | ICD-10-CM

## 2023-12-05 DIAGNOSIS — R319 Hematuria, unspecified: Secondary | ICD-10-CM | POA: Diagnosis not present

## 2023-12-05 DIAGNOSIS — R1032 Left lower quadrant pain: Secondary | ICD-10-CM | POA: Diagnosis not present

## 2023-12-05 DIAGNOSIS — J0100 Acute maxillary sinusitis, unspecified: Secondary | ICD-10-CM | POA: Diagnosis not present

## 2023-12-05 DIAGNOSIS — R300 Dysuria: Secondary | ICD-10-CM

## 2023-12-05 LAB — POCT URINALYSIS DIPSTICK
Bilirubin, UA: NEGATIVE
Blood, UA: POSITIVE
Glucose, UA: NEGATIVE
Ketones, UA: NEGATIVE
Nitrite, UA: NEGATIVE
Protein, UA: NEGATIVE
Spec Grav, UA: 1.01 (ref 1.010–1.025)
Urobilinogen, UA: 0.2 U/dL
pH, UA: 5 (ref 5.0–8.0)

## 2023-12-05 MED ORDER — AMOXICILLIN-POT CLAVULANATE 875-125 MG PO TABS
1.0000 | ORAL_TABLET | Freq: Two times a day (BID) | ORAL | 0 refills | Status: DC
Start: 2023-12-05 — End: 2024-01-01

## 2023-12-05 NOTE — Progress Notes (Signed)
 [facility] [address] [place], [place] [address]  Internal MEDICINE  Office Visit Note  Patient Name: Joy Carpenter  [id number]  [id number]  Date of Service: 12/05/2023  Chief Complaint  Patient presents with   Follow-up    ED    Abdominal Pain    HPI Pt is here for ED follow up from 11/29/23 -Right lower rib started hurting last wed. States she had a cold and coughing made it worse. Felt worse Friday so went to walk in clinic and they sent her to ED due to the abdominal pain -Had EKG, chest xray, and CTA -CTA showed progression of hiatal hernia, pt already planned to follow up with [facility] regarding this as she previously had chronic left side abdominal pain without clear cause and had hx of intermittent CP that could be related to hernia. She will contact [facility] today -interestingly when right side pain started, her chronic left side pain subsided in this last week. Unclear as to why this occurred. -Change in stool since last wed as well, pencil thin, normally can take miralax and will cause normal size movement, but not currently. Had previously discussed considering GI in past for abdominal pain and will refer now due to this change -will be following up with cardiology as well -CTA also showed occulusion of celiac stent, but she states she just had her annual with vascular and they had already noted this and stated it was not a concern at this time and gave her annual follow up again -she does still have sinus congestion -Urine also dipped in office and some trace findings so will send for culture, but will go ahead and start ABX given lasting sinus congestion  CTA 11/29/23 IMPRESSION: 1. 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 No evidence of endoleak. Maximum caliber of the unstented proximal descending thoracic aorta 3.4 x 3.4 cm. 2. Fusiform aneurysm of the abdominal aorta, contiguous with  the descending thoracic aorta, status post overlapping aortobiiliac stent endograft repair, with snorkel stenting of the aortic branch vessels. 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 The superior mesenteric and bilateral renal artery stents are patent. No evidence of endoleak. 3. Emphysema and diffuse bilateral bronchial wall thickening. 4. Large hiatal hernia with intrathoracic position of the gastric body and fundus. 5. Coronary artery disease.   Aortic Atherosclerosis (ICD10-I70.0) and Emphysema (ICD10-J43.9).  Current Medication: Outpatient Encounter Medications as of 12/05/2023  Medication Sig   ALPRAZolam (XANAX) 0.25 MG tablet TAKE 1 TABLET BY MOUTH AS NEEDED FOR ANXIETY   amLODipine (NORVASC) 2.5 MG tablet TAKE ONE TABLET BY MOUTH ONCE DAILY hypertension   amoxicillin-clavulanate (AUGMENTIN) 875-125 MG tablet Take 1 tablet by mouth 2 (two) times daily. Take with food.   aspirin 81 MG chewable tablet Chew by mouth.   Aspirin-Caffeine 845-65 MG PACK Take 1 Dose by mouth as needed.   atorvastatin (LIPITOR) 40 MG tablet Take 1 tablet (40 mg total) by mouth daily.   levothyroxine (SYNTHROID) 50 MCG tablet Take 1 tablet (50 mcg total) by mouth every morning.   losartan (COZAAR) 25 MG tablet Take 25 mg by mouth daily.   pantoprazole (PROTONIX) 40 MG tablet TAKE 1 TABLET BY MOUTH EVERY DAY   traMADol (ULTRAM) 50 MG tablet TAKE 1 TABLET BY MOUTH EVERY DAY AS NEEDED   valACYclovir (VALTREX) 500 MG tablet TAKE 1 TABLET (500 MG TOTAL)  BY MOUTH DAILY.   zolpidem (AMBIEN) 10 MG tablet TAKE 1 TABLET BY MOUTH EVERYDAY AT BEDTIME   No facility-administered encounter medications on file as of 12/05/2023.    Surgical History: Past Surgical History:  Procedure Laterality Date   COLONOSCOPY     COLONOSCOPY WITH PROPOFOL N/A 06/14/2016   Procedure: COLONOSCOPY WITH PROPOFOL;  Surgeon: Midge Minium,  MD;  Location: [facility];  Service: Endoscopy;  Laterality: N/A;   ESOPHAGOGASTRODUODENOSCOPY (EGD) WITH PROPOFOL N/A 06/14/2016   Procedure: ESOPHAGOGASTRODUODENOSCOPY (EGD) WITH PROPOFOL;  Surgeon: Midge Minium, MD;  Location: [facility];  Service: Endoscopy;  Laterality: N/A;   ESOPHAGOGASTRODUODENOSCOPY (EGD) WITH PROPOFOL N/A 11/27/2021   Procedure: ESOPHAGOGASTRODUODENOSCOPY (EGD) WITH BIOPSY;  Surgeon: Midge Minium, MD;  Location: [facility];  Service: Endoscopy;  Laterality: N/A;   HYSTEROSCOPY WITH D & C N/A 07/18/2021   Procedure: DILATATION AND CURETTAGE /HYSTEROSCOPY;  Surgeon: Vena Austria, MD;  Location: [facility] OR[facility];  Service: Gynecology;  Laterality: N/A;   THORACIC AORTIC ANEURYSM REPAIR N/A 11/16/2020   Procedure: 4V FEVAR (CA, SMA, RRA, LRA) WITH PROXIMAL TEVAR EXTENSION WITH BILATERAL ILIAC LIMB EXTENSIONS; Location: [facility]; Surgeon; Bennie Pierini, MD   TUBAL LIGATION      Medical History: Past Medical History:  Diagnosis Date   Actinic keratosis 03/28/2021   right upper arm, bx proven   Anxiety    Carpal tunnel syndrome 2019   Depression    Emphysema of lung (HCC)    "mild"   GERD (gastroesophageal reflux disease)    Headache    daily - stress   Herpes    History of hiatal hernia    Hyperlipidemia    Hypertension    Hypothyroidism    Insomnia    Osteoporosis    SCC (squamous cell carcinoma) 03/28/2021   left dorsal hand, [facility]   SCC (squamous cell carcinoma) 08/01/2023   SCC IS, L ant shoulder, [facility] 08/27/2023   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   left anterior shoulder, [facility] 08/27/23   Thoracic aortic aneurysm (TAA) (HCC)    a.) s/p 4v FEVAR (CA, SMA, RRA, LRA) and proximal TEVAR extention with BILATERAL iliac limb extensions on 11/16/2020   Vertigo    1 episode - approx 2010    Weakness of both legs    intermittent    Family History: Family History  Problem Relation Age of Onset   Leukemia Mother    Aneurysm  Other     Social History   Socioeconomic History   Marital status: Divorced    Spouse name: Not on file   Number of children: Not on file   Years of education: Not on file   Highest education level: Not on file  Occupational History   Not on file  Tobacco Use   Smoking status: Every Day    Current packs/day: 1.00    Average packs/day: 1 pack/day for 30.0 years (30.0 ttl pk-yrs)    Types: Cigarettes   Smokeless tobacco: Never   Tobacco comments:    1 pack daily  Vaping Use   Vaping status: Never Used  Substance and Sexual Activity   Alcohol use: No   Drug use: Never   Sexual activity: Not Currently    Birth control/protection: Post-menopausal  Other Topics Concern   Not on file  Social History Narrative   Lives in [place] with grand-daughter, currently staying with boyfriend   Social Drivers of Health   Financial Resource Strain: Low Risk  (11/29/2023)  Received from [facility]   Overall Financial Resource Strain (CARDIA)    Difficulty of Paying Living Expenses: Not very hard  Food Insecurity: No Food Insecurity (11/29/2023)   Received from [facility]   Hunger Vital Sign    Worried About Running Out of Food in the Last Year: Never true    Ran Out of Food in the Last Year: Never true  Transportation Needs: No Transportation Needs (11/29/2023)   Received from [facility] - Transportation    In the past 12 months, has lack of transportation kept you from medical appointments or from getting medications?: No    Lack of Transportation (Non-Medical): No  Physical Activity: Not on file  Stress: Not on file  Social Connections: Not on file  Intimate Partner Violence: Not on file      Review of Systems  Constitutional:  Negative for chills and diaphoresis.  HENT:  Positive for congestion, postnasal drip and sinus pressure. Negative for ear pain.   Eyes:  Negative for photophobia, discharge, redness,  itching and visual disturbance.  Respiratory:  Positive for cough. Negative for shortness of breath and wheezing.   Cardiovascular:  Negative for palpitations and leg swelling.  Gastrointestinal:  Positive for abdominal pain. Negative for diarrhea, nausea and vomiting.       Change in stool, very thin  Genitourinary:  Negative for dysuria and flank pain.  Musculoskeletal:  Positive for myalgias. Negative for arthralgias, back pain, gait problem and neck pain.  Skin:  Negative for color change.  Allergic/Immunologic: Negative for environmental allergies and food allergies.  Neurological:  Negative for dizziness and headaches.  Hematological:  Does not bruise/bleed easily.  Psychiatric/Behavioral:  Positive for dysphoric mood and sleep disturbance. Negative for agitation and hallucinations. Behavioral problem: depression.The patient is nervous/anxious.     Vital Signs: BP 130/80   Pulse 74   Temp 97.8 F (36.6 C)   Resp 16   Ht 5\' 6"  (1.676 m)   Wt 127 lb (57.6 kg)   SpO2 97%   BMI 20.50 kg/m    Physical Exam Vitals and nursing note reviewed.  Constitutional:      General: She is not in acute distress.    Appearance: She is well-developed. She is not diaphoretic.  HENT:     Head: Normocephalic and atraumatic.  Eyes:     Pupils: Pupils are equal, round, and reactive to light.  Neck:     Thyroid: No thyromegaly.     Vascular: No JVD.     Trachea: No tracheal deviation.  Cardiovascular:     Rate and Rhythm: Normal rate and regular rhythm.     Heart sounds: Normal heart sounds.  Pulmonary:     Effort: Pulmonary effort is normal. No respiratory distress.     Breath sounds: No wheezing or rales.     Comments: Pinpoint tenderness over lower right rib Chest:     Chest wall: Tenderness present.  Abdominal:     Palpations: Abdomen is soft.     Tenderness: Negative signs include Murphy's sign.     Comments: Tenderness over lower right rib, but no other abdominal tednerness   Musculoskeletal:        General: Normal range of motion.     Cervical back: Normal range of motion and neck supple.  Lymphadenopathy:     Cervical: No cervical adenopathy.  Skin:    General: Skin is warm and dry.  Neurological:  Mental Status: She is alert and oriented to person, place, and time.     Cranial Nerves: No cranial nerve deficit.  Psychiatric:        Behavior: Behavior normal.        Thought Content: Thought content normal.        Judgment: Judgment normal.        Assessment/Plan: 1. Hernia, hiatal (Primary) Will schedule with [facility], hernia previously noted as moderate and now large in size - Ambulatory referral to Gastroenterology  2. Right sided abdominal pain More so MSK related right lower rib pain on exam today--likely costochondritis, will monitor. If new or worsening go back to ED - Ambulatory referral to Gastroenterology  3. Change in bowel movement Pt reports very thin stools even with miralax use, will send to GI - Ambulatory referral to Gastroenterology  4. Pencilling of stools - Ambulatory referral to Gastroenterology  5. Chronic LLQ pain Chronic pain for several years without clear cause, recent improvement in the last week, but will go ahead and refer to GI for multiple concerns - Ambulatory referral to Gastroenterology  6. Acute non-recurrent maxillary sinusitis Will start on augmentin for sinusitis and possible UTI - amoxicillin-clavulanate (AUGMENTIN) 875-125 MG tablet; Take 1 tablet by mouth 2 (two) times daily. Take with food.  Dispense: 14 tablet; Refill: 0  7. Urinary tract infection with hematuria, site unspecified - CULTURE, URINE COMPREHENSIVE - amoxicillin-clavulanate (AUGMENTIN) 875-125 MG tablet; Take 1 tablet by mouth 2 (two) times daily. Take with food.  Dispense: 14 tablet; Refill: 0  8. Dysuria - POCT Urinalysis Dipstick   General Counseling: concha sudol understanding of the findings of todays visit and agrees with  plan of treatment. I have discussed any further diagnostic evaluation that may be needed or ordered today. We also reviewed her medications today. she has been encouraged to call the office with any questions or concerns that should arise related to todays visit.    Orders Placed This Encounter  Procedures   CULTURE, URINE COMPREHENSIVE   Ambulatory referral to Gastroenterology   POCT Urinalysis Dipstick    Meds ordered this encounter  Medications   amoxicillin-clavulanate (AUGMENTIN) 875-125 MG tablet    Sig: Take 1 tablet by mouth 2 (two) times daily. Take with food.    Dispense:  14 tablet    Refill:  0    This patient was seen by Lynn Ito, PA-C in collaboration with Dr. Beverely Risen as a part of collaborative care agreement.   Total time spent:30 Minutes Time spent includes review of chart, medications, test results, and follow up plan with the patient.      Dr Lyndon Code Internal medicine

## 2023-12-05 NOTE — Telephone Encounter (Signed)
 GI referral sent via Epic to [facility]. Highlighted for patient on AVS-Toni

## 2023-12-07 DIAGNOSIS — K449 Diaphragmatic hernia without obstruction or gangrene: Secondary | ICD-10-CM

## 2023-12-07 HISTORY — DX: Diaphragmatic hernia without obstruction or gangrene: K44.9

## 2023-12-09 ENCOUNTER — Ambulatory Visit (INDEPENDENT_AMBULATORY_CARE_PROVIDER_SITE_OTHER): Payer: Medicare HMO | Admitting: Surgery

## 2023-12-09 ENCOUNTER — Encounter: Payer: Self-pay | Admitting: Surgery

## 2023-12-09 VITALS — BP 96/61 | HR 72 | Temp 98.0°F | Ht 66.0 in | Wt 125.0 lb

## 2023-12-09 DIAGNOSIS — Z8679 Personal history of other diseases of the circulatory system: Secondary | ICD-10-CM | POA: Diagnosis not present

## 2023-12-09 DIAGNOSIS — K449 Diaphragmatic hernia without obstruction or gangrene: Secondary | ICD-10-CM

## 2023-12-09 NOTE — Patient Instructions (Signed)
Follow up here in 3 weeks. ° ° ° °Please call and ask to speak with a nurse if you develop questions or concerns. ° °

## 2023-12-10 ENCOUNTER — Telehealth: Payer: Self-pay

## 2023-12-10 LAB — CULTURE, URINE COMPREHENSIVE

## 2023-12-10 NOTE — Progress Notes (Signed)
 Outpatient Surgical Follow Up  12/10/2023  Joy Carpenter is an 73 y.o. female.   Chief Complaint  Patient presents with   Follow-up    HPI: Joy Carpenter is a 73 year old female well-known to me with a symptomatic paraesophageal hernia type III.  She has completed her EGD, barium swallow as well as a CT of the abdomen pelvis.  Please note that I have personally reviewed all the imaging studies.  She does have moderate to large sized paraesophageal hernia with about half of her stomach within the mediastinum.  She does have some peristalsis issues but I think that this is related to the chronic paraesophageal hernia and not a primary esophageal muscle disorder.  There is no evidence of a strictures. I did see her 2 years ago when she was already scheduled for paraesophageal hernia repair but since she needed cardiac workup she decided to hold off. Reviewed her more recent CT showing a large paraesophageal hernia. SHe complains of right upper quadrant and epigastric pain.  Before she was complaining of left-sided pain.  Is intermittent sharp and moderate intensity.  SHe is able to perform more than 4 METS of activity without any shortness of breath or chest pain.  She denies any dysphagia Early has been seeing her and there is no active cardiac issues at this time.  s/p f FEVAR and TEVAR extension and Iliac extension    Past Medical History:  Diagnosis Date   Actinic keratosis 03/28/2021   right upper arm, bx proven   Anxiety    Carpal tunnel syndrome 2019   Depression    Emphysema of lung (HCC)    "mild"   GERD (gastroesophageal reflux disease)    Headache    daily - stress   Herpes    History of hiatal hernia    Hyperlipidemia    Hypertension    Hypothyroidism    Insomnia    Osteoporosis    SCC (squamous cell carcinoma) 03/28/2021   left dorsal hand, [facility]   SCC (squamous cell carcinoma) 08/01/2023   SCC IS, L ant shoulder, [facility] 08/27/2023   Squamous cell carcinoma in situ  (SCCIS) 08/01/2023   left anterior shoulder, [facility] 08/27/23   Thoracic aortic aneurysm (TAA) (HCC)    a.) s/p 4v FEVAR (CA, SMA, RRA, LRA) and proximal TEVAR extention with BILATERAL iliac limb extensions on 11/16/2020   Vertigo    1 episode - approx 2010    Weakness of both legs    intermittent    Past Surgical History:  Procedure Laterality Date   COLONOSCOPY     COLONOSCOPY WITH PROPOFOL N/A 06/14/2016   Procedure: COLONOSCOPY WITH PROPOFOL;  Surgeon: Midge Minium, MD;  Location: [facility];  Service: Endoscopy;  Laterality: N/A;   ESOPHAGOGASTRODUODENOSCOPY (EGD) WITH PROPOFOL N/A 06/14/2016   Procedure: ESOPHAGOGASTRODUODENOSCOPY (EGD) WITH PROPOFOL;  Surgeon: Midge Minium, MD;  Location: [facility];  Service: Endoscopy;  Laterality: N/A;   ESOPHAGOGASTRODUODENOSCOPY (EGD) WITH PROPOFOL N/A 11/27/2021   Procedure: ESOPHAGOGASTRODUODENOSCOPY (EGD) WITH BIOPSY;  Surgeon: Midge Minium, MD;  Location: [facility];  Service: Endoscopy;  Laterality: N/A;   HYSTEROSCOPY WITH D & C N/A 07/18/2021   Procedure: DILATATION AND CURETTAGE /HYSTEROSCOPY;  Surgeon: Vena Austria, MD;  Location: [facility] OR[facility];  Service: Gynecology;  Laterality: N/A;   THORACIC AORTIC ANEURYSM REPAIR N/A 11/16/2020   Procedure: 4V FEVAR (CA, SMA, RRA, LRA) WITH PROXIMAL TEVAR EXTENSION WITH BILATERAL ILIAC LIMB EXTENSIONS; Location: [facility]; Surgeon; Bennie Pierini, MD   TUBAL LIGATION  Family History  Problem Relation Age of Onset   Leukemia Mother    Aneurysm Other     Social History:  reports that she has been smoking cigarettes. She has a 30 pack-year smoking history. She has never used smokeless tobacco. She reports that she does not drink alcohol and does not use drugs.  Allergies: No Known Allergies  Medications reviewed.    ROS Full ROS performed and is otherwise negative other than what is stated in HPI   BP 96/61   Pulse 72   Temp 98 F (36.7 C)   Ht 5\' 6"  (1.676 m)    Wt 125 lb (56.7 kg)   SpO2 99%   BMI 20.18 kg/m   Physical Exam Vitals and nursing note reviewed. Exam conducted with a chaperone present.  Constitutional:      General: She is not in acute distress.    Appearance: Normal appearance.  Cardiovascular:     Rate and Rhythm: Normal rate and regular rhythm.     Heart sounds: No murmur heard. Pulmonary:     Effort: Pulmonary effort is normal. No respiratory distress.     Breath sounds: Normal breath sounds. No stridor. No wheezing or rhonchi.     Comments: Right chest wall is tender to palpation Abdominal:     General: Abdomen is flat. There is no distension.     Palpations: Abdomen is soft. There is no mass.     Tenderness: There is no abdominal tenderness. There is no guarding or rebound.     Hernia: No hernia is present.  Musculoskeletal:     Cervical back: Normal range of motion and neck supple. No rigidity or tenderness.  Skin:    General: Skin is warm and dry.     Capillary Refill: Capillary refill takes less than 2 seconds.     Coloration: Skin is not jaundiced or pale.  Neurological:     General: No focal deficit present.     Mental Status: She is alert and oriented to person, place, and time.  Psychiatric:        Mood and Affect: Mood normal.        Behavior: Behavior normal.        Thought Content: Thought content normal.        Judgment: Judgment normal.     Assessment/Plan: 73year-old female with large type III paraesophageal hernia.In addition to this the patient is s/p  FEVAR and TEVAR extension and Iliac extension  .  SHe does have  epigastric and Right upper quadrant pain.  I Discussed with the patient in detail that I do think that the epigastric pain is caused by the paraesophageal hernia.  Since hernia is getting larger she is interested in repair. I DisCussed about options for management of paraesophageal hernia and I do think that he will be reasonable to proceed with paraesophageal hernia repair.  I was very  transparent and candid with her and told her that I was not sure if her abdominal pain will improve or cause by the esophageal hernia.  She understands this.  She wishes to move forward with repair of paraesophageal hernia. She will be a good candidate for robotic repair with mesh placement.  Procedure discussed with her in detail.  Risks, benefits and possible complications including but not limited to, bleeding, infection, esophageal bowel injuries, pneumothorax, chronic pain.  Dysphagia and bloating. She wishes to proceed. She will come back in a few weeks once she is ready to schedule  surgery   Plan for robotic paraesophageal hernia repair with mesh and Toupet fundoplication in the near future Given her aneurysmal history we had her continue her aspirin in the perioperative period I spent more than 40 minutes in this encounter including personally reviewing imaging studies, counseling the patient, coordinating her care, placing orders and performing appropriate documentation   Sterling Big, MD [facility] General Surgeon

## 2023-12-10 NOTE — Telephone Encounter (Signed)
 Spoke with patient regarding urine culture results.

## 2023-12-10 NOTE — Telephone Encounter (Signed)
-----   Message from Carlean Jews sent at 12/10/2023  2:57 PM EST ----- Please let her know that urine culture only showed small amount of strep B growth and may finish current ABX.

## 2023-12-11 ENCOUNTER — Other Ambulatory Visit: Payer: Self-pay | Admitting: Physician Assistant

## 2023-12-11 DIAGNOSIS — R5383 Other fatigue: Secondary | ICD-10-CM

## 2023-12-11 DIAGNOSIS — F5101 Primary insomnia: Secondary | ICD-10-CM

## 2023-12-11 DIAGNOSIS — E559 Vitamin D deficiency, unspecified: Secondary | ICD-10-CM

## 2023-12-11 DIAGNOSIS — K219 Gastro-esophageal reflux disease without esophagitis: Secondary | ICD-10-CM

## 2023-12-11 DIAGNOSIS — R109 Unspecified abdominal pain: Secondary | ICD-10-CM

## 2023-12-11 DIAGNOSIS — Z1239 Encounter for other screening for malignant neoplasm of breast: Secondary | ICD-10-CM

## 2023-12-11 DIAGNOSIS — Z0001 Encounter for general adult medical examination with abnormal findings: Secondary | ICD-10-CM

## 2023-12-11 DIAGNOSIS — F411 Generalized anxiety disorder: Secondary | ICD-10-CM

## 2023-12-11 DIAGNOSIS — Z79899 Other long term (current) drug therapy: Secondary | ICD-10-CM

## 2023-12-11 DIAGNOSIS — Z124 Encounter for screening for malignant neoplasm of cervix: Secondary | ICD-10-CM

## 2023-12-11 DIAGNOSIS — R300 Dysuria: Secondary | ICD-10-CM

## 2023-12-18 NOTE — Progress Notes (Signed)
 Viral panel ordered dueto cough

## 2023-12-23 DIAGNOSIS — F17200 Nicotine dependence, unspecified, uncomplicated: Secondary | ICD-10-CM | POA: Diagnosis not present

## 2023-12-23 DIAGNOSIS — I129 Hypertensive chronic kidney disease with stage 1 through stage 4 chronic kidney disease, or unspecified chronic kidney disease: Secondary | ICD-10-CM | POA: Diagnosis not present

## 2023-12-23 DIAGNOSIS — N1831 Chronic kidney disease, stage 3a: Secondary | ICD-10-CM | POA: Diagnosis not present

## 2023-12-24 ENCOUNTER — Other Ambulatory Visit: Payer: Self-pay | Admitting: Physician Assistant

## 2023-12-24 DIAGNOSIS — G8929 Other chronic pain: Secondary | ICD-10-CM

## 2023-12-24 NOTE — Telephone Encounter (Signed)
 Please review and send

## 2024-01-01 ENCOUNTER — Encounter: Payer: Self-pay | Admitting: Surgery

## 2024-01-01 ENCOUNTER — Ambulatory Visit (INDEPENDENT_AMBULATORY_CARE_PROVIDER_SITE_OTHER): Admitting: Surgery

## 2024-01-01 ENCOUNTER — Telehealth: Payer: Self-pay | Admitting: Surgery

## 2024-01-01 VITALS — BP 97/65 | HR 76 | Temp 98.4°F | Ht 66.0 in | Wt 126.2 lb

## 2024-01-01 DIAGNOSIS — K449 Diaphragmatic hernia without obstruction or gangrene: Secondary | ICD-10-CM

## 2024-01-01 NOTE — H&P (View-Only) (Signed)
 Outpatient Surgical Follow Up  01/01/2024  Joy Carpenter is an 73 y.o. female.   Chief Complaint  Patient presents with   Follow-up    Hiatal hernia    HPI: Joy Carpenter is a 73year-old female well-known to me with a symptomatic paraesophageal hernia type III.  She has completed her EGD, barium swallow as well as a CT of the abdomen pelvis.  Please note that I have personally reviewed all the imaging studies.  She does have moderate to large sized paraesophageal hernia with about half of her stomach within the mediastinum.  She does have some peristalsis issues but I think that this is related to the chronic paraesophageal hernia and not a primary esophageal muscle disorder.  There is no evidence of a strictures. I did see her 2 years ago when she was already scheduled for paraesophageal hernia repair but since she needed cardiac workup she decided to hold off. Reviewed her more recent CT showing a large paraesophageal hernia. SHe complains of right upper quadrant and epigastric pain.  Before she was complaining of left-sided pain.  Is intermittent sharp and moderate intensity.  She does have an upcoming appt w cardiology but shows no cardiac issues at this time. SHe is able to perform more than 4 METS of activity without any shortness of breath or chest pain.  She denies any dysphagia s/p f FEVAR and TEVAR extension and Iliac extension   Past Medical History:  Diagnosis Date   Actinic keratosis 03/28/2021   right upper arm, bx proven   Anxiety    Carpal tunnel syndrome 2019   Depression    Emphysema of lung (HCC)    "mild"   GERD (gastroesophageal reflux disease)    Headache    daily - stress   Herpes    History of hiatal hernia    Hyperlipidemia    Hypertension    Hypothyroidism    Insomnia    Osteoporosis    SCC (squamous cell carcinoma) 03/28/2021   left dorsal hand, [facility]   SCC (squamous cell carcinoma) 08/01/2023   SCC IS, L ant shoulder, [facility] 08/27/2023   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   left anterior shoulder, [facility] 08/27/23   Thoracic aortic aneurysm (TAA) (HCC)    a.) s/p 4v FEVAR (CA, SMA, RRA, LRA) and proximal TEVAR extention with BILATERAL iliac limb extensions on 11/16/2020   Vertigo    1 episode - approx 2010    Weakness of both legs    intermittent    Past Surgical History:  Procedure Laterality Date   COLONOSCOPY     COLONOSCOPY WITH PROPOFOL N/A 06/14/2016   Procedure: COLONOSCOPY WITH PROPOFOL;  Surgeon: Midge Minium, MD;  Location: [facility];  Service: Endoscopy;  Laterality: N/A;   ESOPHAGOGASTRODUODENOSCOPY (EGD) WITH PROPOFOL N/A 06/14/2016   Procedure: ESOPHAGOGASTRODUODENOSCOPY (EGD) WITH PROPOFOL;  Surgeon: Midge Minium, MD;  Location: [facility];  Service: Endoscopy;  Laterality: N/A;   ESOPHAGOGASTRODUODENOSCOPY (EGD) WITH PROPOFOL N/A 11/27/2021   Procedure: ESOPHAGOGASTRODUODENOSCOPY (EGD) WITH BIOPSY;  Surgeon: Midge Minium, MD;  Location: [facility];  Service: Endoscopy;  Laterality: N/A;   HYSTEROSCOPY WITH D & C N/A 07/18/2021   Procedure: DILATATION AND CURETTAGE /HYSTEROSCOPY;  Surgeon: Vena Austria, MD;  Location: [facility];  Service: Gynecology;  Laterality: N/A;   THORACIC AORTIC ANEURYSM REPAIR N/A 11/16/2020   Procedure: 4V FEVAR (CA, SMA, RRA, LRA) WITH PROXIMAL TEVAR EXTENSION WITH BILATERAL ILIAC LIMB EXTENSIONS; Location: [facility]; Surgeon; Bennie Pierini, MD  TUBAL LIGATION      Family History  Problem Relation Age of Onset   Leukemia Mother    Aneurysm Other     Social History:  reports that she has been smoking cigarettes. She has a 30 pack-year smoking history. She has never used smokeless tobacco. She reports that she does not drink alcohol and does not use drugs.  Allergies: No Known Allergies  Medications reviewed.    ROS Full ROS performed and is otherwise negative other than what is stated in HPI   BP 97/65   Pulse 76   Temp 98.4 F (36.9 C)  (Oral)   Ht 5\' 6"  (1.676 m)   Wt 126 lb 3.2 oz (57.2 kg)   SpO2 97%   BMI 20.37 kg/m   Physical Exam  Physical Exam Vitals and nursing note reviewed. Exam conducted with a chaperone present.  Constitutional:      General: She is not in acute distress.    Appearance: Normal appearance.  Cardiovascular:     Rate and Rhythm: Normal rate and regular rhythm.     Heart sounds: No murmur heard. Pulmonary:     Effort: Pulmonary effort is normal. No respiratory distress.     Breath sounds: Normal breath sounds. No stridor. No wheezing or rhonchi.     Comments:  Abdominal:     General: Abdomen is flat. There is no distension.     Palpations: Abdomen is soft. There is no mass.     Tenderness: There is no abdominal tenderness. There is no guarding or rebound.     Hernia: No hernia is present.  Musculoskeletal:     Cervical back: Normal range of motion and neck supple. No rigidity or tenderness.  Skin:    General: Skin is warm and dry.     Capillary Refill: Capillary refill takes less than 2 seconds.     Coloration: Skin is not jaundiced or pale.  Neurological:     General: No focal deficit present.     Mental Status: She is alert and oriented to person, place, and time.  Psychiatric:        Mood and Affect: Mood normal.        Behavior: Behavior normal.        Thought Content: Thought content normal.        Judgment: Judgment normal.        Assessment/Plan: 73year-old female with large type III paraesophageal hernia.In addition to this the patient is s/p  FEVAR and TEVAR extension and Iliac extension  .  SHe does have  upper abdominal  pain.  I Discussed with the patient in detail that I do think that the epigastric pain is caused by the paraesophageal hernia.  Since hernia is getting larger she is interested in repair. I DisCussed about options for management of paraesophageal hernia and I do think that he will be reasonable to proceed with paraesophageal hernia repair.  I was very  transparent and candid with her and told her that I was not sure if her abdominal pain will improve or cause by the esophageal hernia.  She understands this.  She wishes to move forward with repair of paraesophageal hernia. She will be a good candidate for robotic repair with mesh placement.  Procedure discussed with her in detail.  Risks, benefits and possible complications including but not limited to, bleeding, infection, esophageal bowel injuries, pneumothorax, chronic pain.  Dysphagia and bloating. She wishes to proceed. She will come back in a  few weeks once she is ready to schedule surgery    Plan for robotic paraesophageal hernia repair with mesh and Toupet fundoplication next month Given her aneurysmal history we had her continue her aspirin in the perioperative period I spent more than 30 minutes in this encounter including personally reviewing imaging studies, counseling the patient, coordinating her care, placing orders and performing appropriate documentation     Sterling Big, MD [facility] General Surgeon

## 2024-01-01 NOTE — Patient Instructions (Signed)
 We have spoken today about repairing your Hiatal Hernia. Your surgery will be scheduled at [facility] with Dr. Everlene Farrier.  Plan to be in the hospital for 1-2 days if the minimally invasive surgery is completed without having to make a bigger incision. If the bigger incision is made, you will most likely need to be in the hospital 4-6 days. You will be on a soft diet and need to recover for 2 weeks following your surgery prior to doing any of your normal activities. At the 2 week mark, we will see you in the office and if you are doing ok we will advance your diet and activity level as you tolerate.  Please see your Blue (Pre-care) Sheet for more information regarding your surgery. Our surgery scheduler will call you to verify surgery date and to go over information  You will need to arrange to be out of work for approximately 1-2 weeks and then you may return with a lifting restriction for 4 more weeks. If you have FMLA or Disability paperwork that needs to be filled out, please have your company fax your paperwork to [phone] or you may drop this by either office. This paperwork will be filled out within 3 days after your surgery has been completed.  Please call our office with any questions or concerns that you have regarding your surgery and recovery.    Laparoscopic Nissen Fundoplication Laparoscopic Nissen fundoplication is surgery to relieve heartburn and other problems caused by gastric fluids flowing up into your esophagus. The esophagus is the tube that carries food and liquid from your throat to your stomach. Normally, the muscle that sits between your stomach and your esophagus (lower esophageal sphincter or LES) keeps stomach fluids in your stomach. In some people, the LES does not work properly, and stomach fluids flow up into the esophagus. This can happen when part of the stomach bulges through the LES (hiatal hernia). The backward flow of stomach fluids can cause a type of severe and  long-standing heartburn that is called gastroesophageal reflux disease (GERD). You may need this surgery if other treatments for GERD have not helped. In a laparoscopic Nissen fundoplication, the upper part of your stomach is wrapped around the lower part of your esophagus to strengthen the LES and prevent reflux. If you have a hiatal hernia, it will also be repaired with this surgery. The procedure is done through several small incisions in your abdomen. It is performed using a thin, telescopic instrument (laparoscope) and other instruments that can pass through the scope or through other small incisions. Tell a health care provider about: Any allergies you have. All medicines you are taking, including vitamins, herbs, eye drops, creams, and over-the-counter medicines. Any problems you or family members have had with anesthetic medicines. Any blood disorders you have. Any surgeries you have had. Any medical conditions you have. What are the risks? Generally, this is a safe procedure. However, problems may occur, including: Difficulty swallowing (dysphagia). Bloating. Nausea or vomiting. Damage to the lung, causing a collapsed lung. Infection or bleeding. What happens before the procedure? Ask your health care provider about: Changing or stopping your regular medicines. This is especially important if you are taking diabetes medicines or blood thinners. Taking medicines such as aspirin and ibuprofen. These medicines can thin your blood. Do not take these medicines before your procedure if your health care provider asks you not to. Follow your health care provider's instructions about eating or drinking restrictions. Plan to have someone take  you home after the procedure. What happens during the procedure? An IV tube will be inserted into one of your veins. It will be used to give you fluids and medicines during the procedure. You will be given a medicine that makes you fall asleep (general  anesthetic). Your abdomen will be cleaned with a germ-killing solution (antiseptic). The surgeon will make a small incision in your abdomen and insert a tube through the incision. Your abdomen will be filled with a gas. This helps the surgeon to see your organs more easily and it makes more space to work. The surgeon will insert the laparoscope through the incision. The scope has a camera that will send pictures to a monitor in the operating room. The surgeon will make several other small incisions in your abdomen to insert the other instruments that are needed during the procedure. Another instrument (dilator) will be passed through your mouth and down your esophagus into the upper part of your stomach. The dilator will prevent your LES from being closed too tightly during surgery. The surgeon will pass the top portion of your stomach behind the lower part of your esophagus and wrap it all the way around. This will be stitched into place. If you have a hiatal hernia, it will be repaired during this procedure. All instruments will be removed, and your incisions will be closed under your skin with stitches (sutures). Skin adhesive strips may also be used. A bandage (dressing) will be placed on your skin over the incisions. The procedure may vary among health care providers and hospitals. What happens after the procedure? You will be moved to a recovery area. Your blood pressure, heart rate, breathing rate, and blood oxygen level will be monitored often until the medicines you were given have worn off. You will be given pain medicine as needed. Your IV tube will be kept in until you are able to drink fluids. This information is not intended to replace advice given to you by your health care provider. Make sure you discuss any questions you have with your health care provider. Document Released: 10/15/2014 Document Revised: 03/01/2016 Document Reviewed: 05/26/2014 Elsevier Interactive Patient  Education  2017 [employer]   Laparoscopic Nissen Fundoplication, Care After Refer to this sheet in the next few weeks. These instructions provide you with information about caring for yourself after your procedure. Your health care provider may also give you more specific instructions. Your treatment has been planned according to current medical practices, but problems sometimes occur. Call your health care provider if you have any problems or questions after your procedure. What can I expect after the procedure? After the procedure, it is common to have: Difficulty swallowing (dysphagia). Excess gas (bloating). Follow these instructions at home: Medicines  Take medicines only as directed by your health care provider. Do not drive or operate heavy machinery while taking pain medicine. Incision care  There are many different ways to close and cover an incision, including stitches (sutures), skin glue, and adhesive strips. Follow your health care provider's instructions about: Incision care. Bandage (dressing) changes and removal. Incision closure removal. Check your incision areas every day for signs of infection. Watch for: Redness, swelling, or pain. Fluid, blood, or pus. Do not take baths, swim, or use a hot tub until your health care provider approves. Take showers as directed by your health care provider. Eating and drinking  Follow your health care provider's instructions about eating. You may need to follow a very soft diet for 2  weeks, followed by a diet of more regular foods for 2 weeks, no breads. You should return to your usual diet gradually. Drink enough fluid to keep your urine clear or pale yellow. Activity  Return to your normal activities as directed by your health care provider. Ask your health care provider what activities are safe for you. Avoid strenuous exercise. Do not lift anything that is heavier than 10 lb (4.5 kg). Ask your health care provider when you  can: Return to sexual activity. Drive. Go back to work. Contact a health care provider if: You have a fever. Your pain gets worse or is not helped by medicine. You have frequent nausea or vomiting. You have continued abdominal bloating. You have an ongoing (persistent) cough. You have redness, swelling, or pain in any incision areas. You have fluid, blood, or pus coming from any incisions. Get help right away if: You have trouble breathing. You are unable to swallow. You have persistent vomiting. You have blood in your vomit. You have severe abdominal pain. This information is not intended to replace advice given to you by your health care provider. Make sure you discuss any questions you have with your health care provider. Document Released: 05/17/2004 Document Revised: 03/01/2016 Document Reviewed: 05/26/2014 Elsevier Interactive Patient Education  2017 [employer] Inc.   Diet After Nissen Fundoplication Surgery This diet information is for patients who have recently had Nissen fundoplication surgery to correct reflux disease or to repair various types of hernias, such as hiatal hernia and intrathoracic stomach. This diet may also be used for other gastrointestinal surgeries, such as Heller myotomy and repair of achalasia. The diet will help control diarrhea, excess gas and swallowing problems, which may occur after this type of surgery. Keeping Your Stomach from Stretching Eat small, frequent meals (six to eight per day). This will help you consume the majority of the nutrients you need without causing your stomach to feel full or distended.  Drinking large amounts of fluids with meals can stretch your stomach. You may drink fluids between meals as often as you like, but limit fluids to 1/2 cup (4 fluid ounces) with meals and one cup (8 fluid ounces) with snacks.  Sit upright while eating and stay upright for 30 minutes after each meal. Gravity can help food move through your digestive  tract. Do not lie down after eating. Sit upright for 2 hours after your last meal or snack of the day.  Eat very slowly. Take your time when eating.  Take small bites and chew your food well to help aid in swallowing and digestion.  Avoid crusty breads and sticky, gummy foods, such as bananas, fresh doughy breads, rolls and doughnuts. These types of foods become sticky and difficult to swallow.  Toasted breads tend to be better tolerated.  Lastly, if you eat sweets, consume them at the end of your meal to avoid a group of symptoms referred to as "dumping syndrome". This describes the rapid emptying of foods from the stomach to the small intestine. Sweetened beverages, candy and desserts move more rapidly and dump quickly into the intestines. This can cause symptoms of nausea, weakness, cold sweats, cramps, diarrhea and dizzy spells.  Avoiding Gas Avoid drinking through a straw. Do not chew gum or tobacco. These actions cause you to swallow air, which produces excess gas in your stomach. Chew with your mouth closed.  Avoid any foods that cause stomach gas and distention. These foods include corn, dried beans, peas, lentils, onions, broccoli, cauliflower  and any food from the cabbage family.  Avoid carbonated drinks, alcohol, citrus and tomato products.  When will I be able to eat a soft diet? After Nissen fundoplication surgery, your diet will be advanced slowly by your surgeon. Generally, you will be on a clear liquid diet for the first few meals. Then you will advance to the full liquid diet for a meal or two and eventually to a Nissen soft diet. Please be aware that each patient's tolerance to food is different. Your doctor will advance your diet depending on how well you progress after surgery. Clear Liquid Diet  The first diet after surgery is the clear liquid diet. It includes the following liquids: Apple juice  Cranberry juice  Grape juice  Chicken broth  Beef broth  Flavored gelatin  (Jell-O)  Decaf tea and coffee  Caffeinated beverages are permitted based on tolerance  Popsicles  [place] ice Carbonated drinks (sodas) are not allowed for the first six to eight weeks after surgery. After this time you can try them again in small amounts.  Full Liquid Diet The full liquid diet contains anything on the clear liquid diet, plus: Milk, soy, rice and almond (no chocolate)  Cream of wheat, cream of rice, grits  Strained creamed soups (no tomato or broccoli)  Vanilla and strawberry-flavored ice cream  Sherbet  Blended, custard styled or whipped yogurt (plain or vanilla only)  Vanilla and butterscotch pudding (no chocolate or coconut)  Nutritional drinks including Ensure, Boost, Carnation Instant Breakfast (no chocolate-flavored) Note: Dairy products, such as milk, ice cream and pudding, may cause diarrhea in some people just after surgery. You may need to avoid milk products. If so, substitute them with lactose-free beverages, such as soy, rice, Lactaid or almond milks.  Nissen Soft Diet Food Category Foods to Choose Foods to Avoid  Beverages Milk, such as, whole, 2%, 1%, non-fat, or skim, soy, rice, almond  Caffeinated and decaf tea and coffee  Powdered drink mixes (in moderation)  Non-citrus juices (apple, grape, cranberry or blends of these)  Fruit nectars  Nutritional drinks including Boost, Ensure, Carnation Instant Breakfast Chocolate milk, cocoa or other chocolate-flavored drinks  Carbonated drinks  Alcohol  Citrus juices like orange, grapefruit, lemon and lime  Breads Crackers (saltine, butter, soda, graham, Goldfish and Cheese Nips)   Untoasted bread, bagels, [place] and hard rolls, English muffins  Crackers with nuts, seeds, fresh or dried fruit, coconut, or highly seasoned, such as garlic or onion-flavored  Sweet rolls, coffee cake or doughnuts  Cereals Well cooked cereals, such as oatmeal (plain or flavored)  Cold cereal (Cornflakes, Rice  Krispies, Cheerios, Special K plain, Rice Chex and puffed rice) Very coarse cereal, such as bran, shredded wheat  Any cereal with fresh or dried fruit, coconut, seeds or nuts  Desserts Eat in moderation and do not eat desserts or sweets by themselves. Plain cakes, cookies and cream-filled pies  Vanilla and butterscotch pudding or custard  Ice cream, ice milk, frozen yogurt and sherbet  Gelatin made from allowed foods  Fruit ices and popsicles Desserts containing chocolate, coconut, nuts, seeds, fresh or dried fruit, peppermint or spearmint  Eggs  Poached, hard boiled or scrambled Fried eggs and highly seasoned eggs (deviled eggs)  Fats Eat in moderation. Butter and margarine  Mayonnaise and vegetable oils  Mildly seasoned cream sauces and gravies  Plain cream cheese  Sour cream Highly seasoned salad dressings, cream sauces and gravies  Bacon, bacon fat, ham fat, lard and salt pork  Foy [place]  foods  Nuts  Fruits Fruit juice  Any canned or cooked fruit except those listed in the AVOID column ALL fresh fruits, such as citrus, apples, and pineapple  Canned pineapple  Dried fruits, such as raisins, berries  Fruits with seeds, such as berries, kiwi and figs  Meat, Fish, Poultry, and [employer] may be ground, minced or chopped to ease swallowing and digestion  Tender, well cooked and moist cuts of beef, chicken, [place] and pork  Veal and lamb  Flaky, cooked fish  Canned tuna  Cottage and ricotta cheeses  Mild cheese, such as American, brick, mozzarella and baby Swiss  Creamy peanut butter  Plain custard or blended fruit yogurt  Moist casseroles, such as macaroni & cheese, tuna noodle  Grilled or toasted cheese sandwich Tough meats with a lot of gristle  Fried, highly seasoned, smoked and fatty meat, fish or poultry, such as frankfurters, luncheon meats, sausage, bacon, spare ribs, beef brisket, sardines, anchovies, duck and goose  Chili and other entrees made with pepper or  chili pepper  Shellfish  Strongly flavored cheeses, such as sharp cheese, extra sharp cheddar, cheese containing peppers or other seasonings  Crunchy peanut butter  Any yogurt with nuts, seeds, coconut, strawberries or raspberries  Potatoes and Starches Peeled, mashed or boiled white or sweet potatoes  Oven-baked potatoes without skin  Well cooked white rice, enriched noodles, barley, spaghetti, macaroni and other pastas Fried potatoes, potato skins and potato chips  Hard and soft taco shells  Fried, brown or wild rice  Soups Mildly flavored meat stocks  Cream soups made from allowed foods Highly seasoned soups and tomato based soups, cream soups made with gas producing vegetables, such as broccoli, cauliflower, onion, etc.  Sweets and Snacks Use in moderation and do not eat large amounts of sweets by themselves. Syrup, honey, jelly and seedless jam  Plain hard candies and plain candies made with allowed ingredients  Molasses  Marshmallows  Other candy made from allowed ingredients  Thin pretzels Jam, marmalade and preserves  Chocolate in any form  Any candy containing nuts, coconut, seeds, peppermint, spearmint or dried or fresh fruit  Popcorn, potato chips, tortilla chips  Soft or hard thick pretzels, such as sourdough  Vegetables Well cooked soft vegetables without seeds or skins, such as asparagus tips, beets, carrots, green and wax beans, chopped spinach, tender canned baby peas, squash and pumpkin Raw vegetables, tomatoes, tomato juice, tomato sauce and V-8 juice(tomato based products can irritate the stomach)  Gas producing vegetables, such as broccoli, Brussel sprouts, cabbage, cauliflower, onions, corn, cucumber, green peppers, rutabagas, turnips, radishes and sauerkraut  Dried beans, peas and lentils  Miscellaneous Salt and spices in moderation  Mustard and vinegar in moderation Fried or highly seasoned foods  Coconut and seeds  Pickles and olives  Chili sauces, ketchup,  barbecue sauce, horseradish, black pepper, chili powder and onion and garlic seasonings  Any other strongly flavored seasoning, condiment, spice or herb not tolerated  Any food not tolerated

## 2024-01-01 NOTE — Telephone Encounter (Signed)
 Patient has been advised of Pre-Admission date/time, and Surgery date at [facility].  Surgery Date: 01/16/24 Preadmission Testing Date: 01/09/24 (phone 8a-1p)  Patient has been made aware to call [phone], between 1-3:00pm the day before surgery, to find out what time to arrive for surgery.

## 2024-01-01 NOTE — Progress Notes (Signed)
 Outpatient Surgical Follow Up  01/01/2024  Joy Carpenter is an 73 y.o. female.   Chief Complaint  Patient presents with   Follow-up    Hiatal hernia    HPI: Joy Carpenter is a 73year-old female well-known to me with a symptomatic paraesophageal hernia type III.  She has completed her EGD, barium swallow as well as a CT of the abdomen pelvis.  Please note that I have personally reviewed all the imaging studies.  She does have moderate to large sized paraesophageal hernia with about half of her stomach within the mediastinum.  She does have some peristalsis issues but I think that this is related to the chronic paraesophageal hernia and not a primary esophageal muscle disorder.  There is no evidence of a strictures. I did see her 2 years ago when she was already scheduled for paraesophageal hernia repair but since she needed cardiac workup she decided to hold off. Reviewed her more recent CT showing a large paraesophageal hernia. SHe complains of right upper quadrant and epigastric pain.  Before she was complaining of left-sided pain.  Is intermittent sharp and moderate intensity.  She does have an upcoming appt w cardiology but shows no cardiac issues at this time. SHe is able to perform more than 4 METS of activity without any shortness of breath or chest pain.  She denies any dysphagia s/p f FEVAR and TEVAR extension and Iliac extension   Past Medical History:  Diagnosis Date   Actinic keratosis 03/28/2021   right upper arm, bx proven   Anxiety    Carpal tunnel syndrome 2019   Depression    Emphysema of lung (HCC)    "mild"   GERD (gastroesophageal reflux disease)    Headache    daily - stress   Herpes    History of hiatal hernia    Hyperlipidemia    Hypertension    Hypothyroidism    Insomnia    Osteoporosis    SCC (squamous cell carcinoma) 03/28/2021   left dorsal hand, [facility]   SCC (squamous cell carcinoma) 08/01/2023   SCC IS, L ant shoulder, [facility] 08/27/2023   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   left anterior shoulder, [facility] 08/27/23   Thoracic aortic aneurysm (TAA) (HCC)    a.) s/p 4v FEVAR (CA, SMA, RRA, LRA) and proximal TEVAR extention with BILATERAL iliac limb extensions on 11/16/2020   Vertigo    1 episode - approx 2010    Weakness of both legs    intermittent    Past Surgical History:  Procedure Laterality Date   COLONOSCOPY     COLONOSCOPY WITH PROPOFOL N/A 06/14/2016   Procedure: COLONOSCOPY WITH PROPOFOL;  Surgeon: Midge Minium, MD;  Location: [facility];  Service: Endoscopy;  Laterality: N/A;   ESOPHAGOGASTRODUODENOSCOPY (EGD) WITH PROPOFOL N/A 06/14/2016   Procedure: ESOPHAGOGASTRODUODENOSCOPY (EGD) WITH PROPOFOL;  Surgeon: Midge Minium, MD;  Location: [facility];  Service: Endoscopy;  Laterality: N/A;   ESOPHAGOGASTRODUODENOSCOPY (EGD) WITH PROPOFOL N/A 11/27/2021   Procedure: ESOPHAGOGASTRODUODENOSCOPY (EGD) WITH BIOPSY;  Surgeon: Midge Minium, MD;  Location: [facility];  Service: Endoscopy;  Laterality: N/A;   HYSTEROSCOPY WITH D & C N/A 07/18/2021   Procedure: DILATATION AND CURETTAGE /HYSTEROSCOPY;  Surgeon: Vena Austria, MD;  Location: [facility];  Service: Gynecology;  Laterality: N/A;   THORACIC AORTIC ANEURYSM REPAIR N/A 11/16/2020   Procedure: 4V FEVAR (CA, SMA, RRA, LRA) WITH PROXIMAL TEVAR EXTENSION WITH BILATERAL ILIAC LIMB EXTENSIONS; Location: [facility]; Surgeon; Bennie Pierini, MD  TUBAL LIGATION      Family History  Problem Relation Age of Onset   Leukemia Mother    Aneurysm Other     Social History:  reports that she has been smoking cigarettes. She has a 30 pack-year smoking history. She has never used smokeless tobacco. She reports that she does not drink alcohol and does not use drugs.  Allergies: No Known Allergies  Medications reviewed.    ROS Full ROS performed and is otherwise negative other than what is stated in HPI   BP 97/65   Pulse 76   Temp 98.4 F (36.9 C)  (Oral)   Ht 5\' 6"  (1.676 m)   Wt 126 lb 3.2 oz (57.2 kg)   SpO2 97%   BMI 20.37 kg/m   Physical Exam  Physical Exam Vitals and nursing note reviewed. Exam conducted with a chaperone present.  Constitutional:      General: She is not in acute distress.    Appearance: Normal appearance.  Cardiovascular:     Rate and Rhythm: Normal rate and regular rhythm.     Heart sounds: No murmur heard. Pulmonary:     Effort: Pulmonary effort is normal. No respiratory distress.     Breath sounds: Normal breath sounds. No stridor. No wheezing or rhonchi.     Comments:  Abdominal:     General: Abdomen is flat. There is no distension.     Palpations: Abdomen is soft. There is no mass.     Tenderness: There is no abdominal tenderness. There is no guarding or rebound.     Hernia: No hernia is present.  Musculoskeletal:     Cervical back: Normal range of motion and neck supple. No rigidity or tenderness.  Skin:    General: Skin is warm and dry.     Capillary Refill: Capillary refill takes less than 2 seconds.     Coloration: Skin is not jaundiced or pale.  Neurological:     General: No focal deficit present.     Mental Status: She is alert and oriented to person, place, and time.  Psychiatric:        Mood and Affect: Mood normal.        Behavior: Behavior normal.        Thought Content: Thought content normal.        Judgment: Judgment normal.        Assessment/Plan: 73year-old female with large type III paraesophageal hernia.In addition to this the patient is s/p  FEVAR and TEVAR extension and Iliac extension  .  SHe does have  upper abdominal  pain.  I Discussed with the patient in detail that I do think that the epigastric pain is caused by the paraesophageal hernia.  Since hernia is getting larger she is interested in repair. I DisCussed about options for management of paraesophageal hernia and I do think that he will be reasonable to proceed with paraesophageal hernia repair.  I was very  transparent and candid with her and told her that I was not sure if her abdominal pain will improve or cause by the esophageal hernia.  She understands this.  She wishes to move forward with repair of paraesophageal hernia. She will be a good candidate for robotic repair with mesh placement.  Procedure discussed with her in detail.  Risks, benefits and possible complications including but not limited to, bleeding, infection, esophageal bowel injuries, pneumothorax, chronic pain.  Dysphagia and bloating. She wishes to proceed. She will come back in a  few weeks once she is ready to schedule surgery    Plan for robotic paraesophageal hernia repair with mesh and Toupet fundoplication next month Given her aneurysmal history we had her continue her aspirin in the perioperative period I spent more than 30 minutes in this encounter including personally reviewing imaging studies, counseling the patient, coordinating her care, placing orders and performing appropriate documentation     Sterling Big, MD [facility] General Surgeon

## 2024-01-08 ENCOUNTER — Encounter: Payer: Self-pay | Admitting: Cardiology

## 2024-01-08 ENCOUNTER — Ambulatory Visit: Payer: Medicare HMO | Attending: Cardiology | Admitting: Cardiology

## 2024-01-08 VITALS — BP 98/72 | HR 69 | Ht 66.0 in | Wt 128.0 lb

## 2024-01-08 DIAGNOSIS — I10 Essential (primary) hypertension: Secondary | ICD-10-CM

## 2024-01-08 DIAGNOSIS — F17200 Nicotine dependence, unspecified, uncomplicated: Secondary | ICD-10-CM

## 2024-01-08 DIAGNOSIS — I714 Abdominal aortic aneurysm, without rupture, unspecified: Secondary | ICD-10-CM

## 2024-01-08 DIAGNOSIS — I739 Peripheral vascular disease, unspecified: Secondary | ICD-10-CM

## 2024-01-08 DIAGNOSIS — I2510 Atherosclerotic heart disease of native coronary artery without angina pectoris: Secondary | ICD-10-CM

## 2024-01-08 DIAGNOSIS — Z01810 Encounter for preprocedural cardiovascular examination: Secondary | ICD-10-CM | POA: Diagnosis not present

## 2024-01-08 DIAGNOSIS — E785 Hyperlipidemia, unspecified: Secondary | ICD-10-CM | POA: Diagnosis not present

## 2024-01-08 MED ORDER — LOSARTAN POTASSIUM 25 MG PO TABS: 12.5000 mg | ORAL_TABLET | Freq: Every day | ORAL | 3 refills | Status: DC

## 2024-01-08 NOTE — Patient Instructions (Signed)
 Medication Instructions:  Your physician recommends the following medication changes.  DECREASE: [place] ( COZAAR) 12.5 MG   *If you need a refill on your cardiac medications before your next appointment, please call your pharmacy*  Lab Work: No labs ordered today  If you have labs (blood work) drawn today and your tests are completely normal, you will receive your results only by: MyChart Message (if you have MyChart) OR A paper copy in the mail If you have any lab test that is abnormal or we need to change your treatment, we will call you to review the results.  Testing/Procedures: No test ordered today   Follow-Up: At [facility], you and your health needs are our priority.  As part of our continuing mission to provide you with exceptional heart care, our providers are all part of one team.  This team includes your primary Cardiologist (physician) and Advanced Practice Providers or APPs (Physician Assistants and Nurse Practitioners) who all work together to provide you with the care you need, when you need it.  Your next appointment:   3 month(s)  Provider:   You may see Debbe Odea, MD or one of the following Advanced Practice Providers on your designated Care Team:   Nicolasa Ducking, NP Ames Dura, PA-C Eula Listen, PA-C Cadence Granbury, PA-C Charlsie Quest, NP Carlos Levering, NP

## 2024-01-08 NOTE — Progress Notes (Unsigned)
 Cardiology Office Note:  .   Date:  01/08/2024  ID:  Joy Carpenter, DOB 25-May-1951, MRN [id number] PCP: Alan Ripper  [facility] Providers Cardiologist:  Debbe Odea, MD { Click to update primary MD,subspecialty MD or APP then REFRESH:1}   History of Present Illness: .   Joy Carpenter is a 73 y.o. female with past medical history of coronary artery disease and coronary calcifications of the chest on chest CT, hypertension, hyperlipidemia, thoracoabdominal aortic aneurysm status post four-vessel F EVAR (CA, SMA, RRA, LRA) proximal TEVAR extension and bilateral iliac limb extensions status post endograft (11/2020), current smoker x 40+ years, who presents today for follow-up of coronary artery disease.   She had complaints of chest pain that been ongoing for approximately a year that seem to worsen in February 2024. Symptoms were not associated with exertion or rest. Her symptoms were pain radiating down her left arm. She had an aneurysm status post endograft repair at [facility] in 2022 and follows with [facility] yearly for monitoring. She feels stressed due to family situations and she was scheduled for an echocardiogram and a Lexiscan Myoview at her last clinic appointment 11/28/2022. She return to clinic in 01/08/2023 overall doing fairly well. She did follow-up with nephrology. Recently started on losartan 25 mg daily. She occasionally was experiencing muscle cramps in the lower extremities and continued atypical chest and abdominal pressure with unrevealing studies.    She was last seen in clinic 11/14/2023 stating overall she been doing well.  She continued to have abdominal pain that radiated up into her chest that is unchanged in frequency and intensity that have been happening for the last 2 years.  There were no medication changes that were made and no further testing that was ordered.  She was evaluated in the [facility] emergency department 11/29/2023 for cough and rib  cage pain.  Overall her workup was unrevealing.  She was treated with a Lidoderm patch and Tylenol.  She was discharged home  She returns to clinic today  ROS: 10 point review of systems has been reviewed and considered negative except what is been listed in the HPI  Studies Reviewed: Marland Kitchen   EKG Interpretation Date/Time:  Wednesday January 08 2024 14:24:37 EDT Ventricular Rate:  69 PR Interval:  192 QRS Duration:  72 QT Interval:  390 QTC Calculation: 417 R Axis:   -23  Text Interpretation: Normal sinus rhythm Normal ECG When compared with ECG of 29-Nov-2023 11:37, No significant change was found Confirmed by Charlsie Quest ([id number]) on 01/08/2024 2:36:03 PM    TTE 12/25/22  1. Left ventricular ejection fraction, by estimation, is 60 to 65%. The  left ventricle has normal function. The left ventricle has no regional  wall motion abnormalities. Left ventricular diastolic parameters are  consistent with Grade I diastolic  dysfunction (impaired relaxation). The average left ventricular global  longitudinal strain is -18.7 %.   2. Right ventricular systolic function is normal. The right ventricular  size is normal. Tricuspid regurgitation signal is inadequate for assessing  PA pressure.   3. The mitral valve is normal in structure. Mild mitral valve  regurgitation. No evidence of mitral stenosis.   4. The aortic valve is tricuspid. Aortic valve regurgitation is not  visualized. Aortic valve sclerosis/calcification is present, without any  evidence of aortic stenosis.   5. The inferior vena cava is normal in size with greater than 50%  respiratory variability, suggesting right atrial pressure of 3 mmHg.  Lexiscan Myoview 12/05/22 LV perfusion was normal Left ventricular function was normal.  Nuclear stress EF 76%.  End-diastolic cavity size is normal. Findings are consistent with no ischemia.  The study is low risk Risk Assessment/Calculations:             Physical Exam:   VS:  BP  98/72   Pulse 69   Ht 5\' 6"  (1.676 m)   Wt 128 lb (58.1 kg)   SpO2 97%   BMI 20.66 kg/m    Wt Readings from Last 3 Encounters:  01/08/24 128 lb (58.1 kg)  01/01/24 126 lb 3.2 oz (57.2 kg)  12/09/23 125 lb (56.7 kg)    GEN: Well nourished, well developed in no acute distress NECK: No JVD; No carotid bruits CARDIAC: ***RRR, no murmurs, rubs, gallops RESPIRATORY:  Clear to auscultation without rales, wheezing or rhonchi  ABDOMEN: Soft, non-tender, non-distended EXTREMITIES:  No edema; No deformity   ASSESSMENT AND PLAN: .   Coronary artery disease of on the native coronary artery of the LAD and RCA noted on chest CT Primary hypertension Mixed hyperlipidemia Peripheral arterial disease with previous surgery by vascular for AAA and endograft Tobacco abuse    {Are you ordering a CV Procedure (e.g. stress test, cath, DCCV, TEE, etc)?   Press F2        :[id number]}  Dispo: ***  Signed, Cheyrl Buley, NP

## 2024-01-09 ENCOUNTER — Encounter: Payer: Self-pay | Admitting: Surgery

## 2024-01-09 ENCOUNTER — Encounter
Admission: RE | Admit: 2024-01-09 | Discharge: 2024-01-09 | Disposition: A | Discharge status: Home / Self Care | Source: Ambulatory Visit | Attending: Surgery | Admitting: Surgery

## 2024-01-09 ENCOUNTER — Other Ambulatory Visit: Payer: Self-pay

## 2024-01-09 HISTORY — DX: Hypothyroidism, unspecified: E03.9

## 2024-01-09 HISTORY — DX: Essential (primary) hypertension: I10

## 2024-01-09 HISTORY — DX: Hypertensive chronic kidney disease with stage 1 through stage 4 chronic kidney disease, or unspecified chronic kidney disease: I12.9

## 2024-01-09 HISTORY — DX: Vitamin D deficiency, unspecified: E55.9

## 2024-01-09 HISTORY — DX: Mixed hyperlipidemia: E78.2

## 2024-01-09 HISTORY — DX: Atherosclerotic heart disease of native coronary artery without angina pectoris: I25.10

## 2024-01-09 NOTE — Patient Instructions (Addendum)
 Your procedure is scheduled on: Thursday, April 10 Report to the Registration Desk on the 1st floor of the [facility] To find out your arrival time, please call [phone] between 1PM - 3PM on: Wednesday, April 9 If your arrival time is 6:00 am, do not arrive before that time as the [facility] entrance doors do not open until 6:00 am.  REMEMBER: Instructions that are not followed completely may result in serious medical risk, up to and including death; or upon the discretion of your surgeon and anesthesiologist your surgery may need to be rescheduled.  Do not eat or drink after midnight the night before surgery.  No gum chewing or hard candies.  One week prior to surgery: starting April 3 Stop Anti-inflammatories (NSAIDS) such as Advil, Aleve, Ibuprofen, Motrin, Naproxen, Naprosyn and Aspirin based products such as Excedrin, Goody's Powder, BC Powder. Stop ANY OVER THE COUNTER supplements until after surgery.  You may however, continue to take Tylenol if needed for pain up until the day of surgery.  Continue taking the 81 mg aspirin.  Continue taking all of your other prescription medications up until the day of surgery.  ON THE DAY OF SURGERY ONLY TAKE THESE MEDICATIONS WITH SIPS OF WATER:  ALPRAZolam (XANAX) only if needed for anxiety amLODipine (NORVASC)  atorvastatin (LIPITOR)  levothyroxine (SYNTHROID)  pantoprazole (PROTONIX)   No Alcohol for 24 hours before or after surgery.  No Smoking including e-cigarettes for 24 hours before surgery.  No chewable tobacco products for at least 6 hours before surgery.  No nicotine patches on the day of surgery.  Do not use any "recreational" drugs for at least a week (preferably 2 weeks) before your surgery.  Please be advised that the combination of cocaine and anesthesia may have negative outcomes, up to and including death. If you test positive for cocaine, your surgery will be cancelled.  On the morning of surgery brush  your teeth with toothpaste and water, you may rinse your mouth with mouthwash if you wish. Do not swallow any toothpaste or mouthwash.  Use CHG Soap as directed on instruction sheet.  Do not wear jewelry, make-up, hairpins, clips or nail polish.  For welded (permanent) jewelry: bracelets, anklets, waist bands, etc.  Please have this removed prior to surgery.  If it is not removed, there is a chance that hospital personnel will need to cut it off on the day of surgery.  Do not wear lotions, powders, or perfumes.   Do not shave body hair from the neck down 48 hours before surgery.  Contact lenses, hearing aids and dentures may not be worn into surgery.  Do not bring valuables to the hospital. [facility] is not responsible for any missing/lost belongings or valuables.   Notify your doctor if there is any change in your medical condition (cold, fever, infection).  Wear comfortable clothing (specific to your surgery type) to the hospital.  After surgery, you can help prevent lung complications by doing breathing exercises.  Take deep breaths and cough every 1-2 hours. Your doctor may order a device called an Incentive Spirometer to help you take deep breaths. When coughing or sneezing, hold a pillow firmly against your incision with both hands. This is called "splinting." Doing this helps protect your incision. It also decreases belly discomfort.  If you are being admitted to the hospital overnight, leave your suitcase in the car. After surgery it may be brought to your room.  In case of increased patient census, it may  be necessary for you, the patient, to continue your postoperative care in the Same Day Surgery department.  If you are being discharged the day of surgery, you will not be allowed to drive home. You will need a responsible individual to drive you home and stay with you for 24 hours after surgery.   If you are taking public transportation, you will need to have a  responsible individual with you.  Please call the Pre-admissions Testing Dept. at [phone] if you have any questions about these instructions.  Surgery Visitation Policy:  Patients having surgery or a procedure may have two visitors.  Children under the age of 102 must have an adult with them who is not the patient.  Inpatient Visitation:    Visiting hours are 7 a.m. to 8 p.m. Up to four visitors are allowed at one time in a patient room. The visitors may rotate out with other people during the day.  One visitor age 38 or older may stay with the patient overnight and must be in the room by 8 p.m.      Preparing for Surgery with CHLORHEXIDINE GLUCONATE (CHG) Soap  Chlorhexidine Gluconate (CHG) Soap  o An antiseptic cleaner that kills germs and bonds with the skin to continue killing germs even after washing  o Used for showering the night before surgery and morning of surgery  Before surgery, you can play an important role by reducing the number of germs on your skin.  CHG (Chlorhexidine gluconate) soap is an antiseptic cleanser which kills germs and bonds with the skin to continue killing germs even after washing.  Please do not use if you have an allergy to CHG or antibacterial soaps. If your skin becomes reddened/irritated stop using the CHG.  1. Shower the NIGHT BEFORE SURGERY and the MORNING OF SURGERY with CHG soap.  2. If you choose to wash your hair, wash your hair first as usual with your normal shampoo.  3. After shampooing, rinse your hair and body thoroughly to remove the shampoo.  4. Use CHG as you would any other liquid soap. You can apply CHG directly to the skin and wash gently with a scrungie or a clean washcloth.  5. Apply the CHG soap to your body only from the neck down. Do not use on open wounds or open sores. Avoid contact with your eyes, ears, mouth, and genitals (private parts). Wash face and genitals (private parts) with your normal soap.  6.  Wash thoroughly, paying special attention to the area where your surgery will be performed.  7. Thoroughly rinse your body with warm water.  8. Do not shower/wash with your normal soap after using and rinsing off the CHG soap.  9. Pat yourself dry with a clean towel.  10. Wear clean pajamas to bed the night before surgery.  12. Place clean sheets on your bed the night of your first shower and do not sleep with pets.  13. Shower again with the CHG soap on the day of surgery prior to arriving at the hospital.  14. Do not apply any deodorants/lotions/powders.  15. Please wear clean clothes to the hospital.

## 2024-01-09 NOTE — Progress Notes (Signed)
 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 Clinical nursing staff has updated patient's PMH/PSHx, current medication list, and drug allergies/intolerances to ensure comprehensive history available to assist in medical decision making as it pertains to the aforementioned surgical procedure and anticipated anesthetic course. Extensive review of available clinical information personally performed. [place] PMH and PSHx updated with any diagnoses/procedures that  may have been inadvertently omitted during his intake with the pre-admission testing department's nursing staff.  Clinical Discussion:  Joy Carpenter is a 73 y.o. female who is submitted for pre-surgical anesthesia review and clearance prior to her undergoing the above procedure. Patient is a Current Smoker (30 pack years). 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  Patient is followed by cardiology Azucena Cecil, MD). She was last seen in the cardiology clinic on 01/08/2024; notes reviewed. At the time of her clinic visit, patient doing well overall from a cardiovascular perspective. Patient denied any chest pain, shortness of breath, PND,  orthopnea, palpitations, significant peripheral edema, weakness, fatigue, vertiginous symptoms, or presyncope/syncope. Patient with a past medical history significant for cardiovascular diagnoses. Documented physical exam was grossly benign, providing no evidence of acute exacerbation and/or decompensation of the patient's known cardiovascular conditions.  Patient with a history of an enlarging right abdominal aortic aneurysm.  Patient ultimately required vascular intervention.  She underwent four-vessel FEVAR (celiac axis, superior mesenteric artery, right renal artery, left renal artery) are the target vessels involved in the procedure. ) and proximal TEVAR extension with BILATERAL iliac limb extensions on 11/16/2020.  Most recent myocardial perfusion imaging study was performed on 12/05/2022 revealing a normal left ventricular systolic function with a hyperdynamic EF of 86%.  There were no regional wall motion abnormalities.  No artifact or left ventricular cavity size enlargement appreciated on review of imaging. SPECT images demonstrated no evidence of stress-induced myocardial ischemia or arrhythmia; no scintigraphic evidence of scar.  Calcifications noted in the LAD and RCA. TID ratio = 1.26. Study determined to be normal and low risk.  Most recent TTE performed on 12/25/2022 revealed a normal left ventricular systolic function with an EF of 60-65%. There were no regional wall motion abnormalities. Left ventricular diastolic Doppler parameters consistent with abnormal relaxation (G1DD). GLS -18.7%. Right ventricular size and function normal with a TAPSE measuring 2.6 cm  (normal range >/= 1.6 cm). Aortic valve sclerosis/calcification present. There was trivial to mild mitral and tricuspid valve regurgitation. All transvalvular gradients were noted to be normal providing no evidence suggestive of valvular stenosis. Aorta normal in size with no evidence of ectasia or aneurysmal dilatation.  Blood  pressure well controlled at 98/72 mmHg on currently prescribed ARB (losartan) monotherapy. Patient is on atorvastatin for her HLD diagnosis and ASCVD prevention. Patient is not diabetic. She does not have an OSAH diagnosis. Patient is able to complete all of  her  ADL/IADLs without cardiovascular limitation.  Per the DASI, patient is able to achieve at least 4 METS of physical activity without experiencing any significant degree of angina/anginal equivalent symptoms. No changes were made to her medication regimen during her visit with cardiology.  Patient scheduled to follow-up with outpatient cardiology in 3 months or sooner if needed.  Joy Carpenter is scheduled for an elective REPAIR, HERNIA, PARAESOPHAGEAL, ROBOT-ASSISTED (Abdomen) on 01/16/2024 with Dr. Sterling Big, MD. Given patient's past medical history significant for cardiovascular diagnoses, presurgical cardiac clearance was sought by the PAT team.  Per cardiology, "Ms. Dicicco's perioperative risk of a major cardiac event is 0.9% according to the Revised Cardiac Risk Index (RCRI).  Therefore, she is at high risk for perioperative complications.   Her functional capacity is good at 6.05 METs according to the Duke Activity Status Index (DASI). According to ACC/AHA guidelines, no further cardiovascular testing needed.  The patient may proceed to surgery at acceptable risk".  In review of the patient's chart, it is noted that she is on daily oral antithrombotic therapy. Given that patient's past medical history is significant for cardiovascular diagnoses, including but not limited to CAD, general surgery has cleared patient to continue her daily low dose ASA throughout her perioperative course.  Patient has been updated on these directives from her specialty care providers by the PAT team.  Patient denies previous perioperative complications with anesthesia in the past. In review her EMR, it is noted that patient underwent a general anesthetic  course at [facility] (ASA III) in 11/2021 without documented complications.      01/09/2024   12:01 PM 01/08/2024    2:12 PM 01/01/2024   11:04 AM  Vitals with BMI  Height 5\' 6"  5\' 6"  5\' 6"   Weight 128 lbs 128 lbs 126 lbs 3 oz  BMI 20.67 20.67 20.38  Systolic  98 97  Diastolic  72 65  Pulse  69 76   Providers/Specialists:  NOTE: Primary physician provider listed below. Patient may have been seen by APP or partner within same practice.   PROVIDER ROLE / SPECIALTY LAST Cherly Hensen, MD General Surgery (Surgeon) 12/09/2023  Carlean Jews, PA-C Primary Care Provider 12/05/2023  Debbe Odea, MD Cardiology 01/09/2024   Allergies:  No Known Allergies Current Home Medications:   No current facility-administered medications for this encounter.    ALPRAZolam (XANAX) 0.25 MG tablet   amLODipine (NORVASC) 2.5 MG tablet   aspirin 81 MG chewable tablet   atorvastatin (LIPITOR) 40 MG tablet   levothyroxine (SYNTHROID) 50 MCG tablet   pantoprazole (PROTONIX) 40 MG tablet   traMADol (ULTRAM) 50 MG tablet   valACYclovir (VALTREX) 500 MG tablet   zolpidem (AMBIEN) 10 MG tablet   losartan (COZAAR) 25 MG tablet   History:   Past Medical History:  Diagnosis Date   Acquired hypothyroidism    Actinic keratosis 03/28/2021   right upper arm, bx proven   Anxiety    a.) on BZO (alproazolam) PRN   Carpal tunnel syndrome 2019   Coronary artery disease involving native coronary artery    of the LAD and RCA noted on chest CT   Depression    Emphysema of lung (HCC)    GERD (gastroesophageal reflux disease)    Herpes    History of hiatal hernia    Hypertensive chronic kidney disease    Insomnia    a.) on hypnotic (zolpidem) PRN   Mixed hyperlipidemia    Osteoporosis  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   Primary hypertension    SCC (squamous cell carcinoma) 03/28/2021   left dorsal hand, [facility]   SCC (squamous cell carcinoma) 08/01/2023   SCC IS, L ant shoulder, [facility]  08/27/2023   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   left anterior shoulder, [facility] 08/27/23   Stress headaches (almost daily)    Thoracoabdominal aortic aneurysm (HCC)    a.) s/p 4v FEVAR (CA, SMA, RRA, LRA) and proximal TEVAR extention with BILATERAL iliac limb extensions on 11/16/2020   Vertigo 2010   Vitamin D deficiency    Weakness of both legs (intermittent)    Past Surgical History:  Procedure Laterality Date   COLONOSCOPY     COLONOSCOPY WITH PROPOFOL N/A 06/14/2016   Procedure: COLONOSCOPY WITH PROPOFOL;  Surgeon: Midge Minium, MD;  Location: [facility];  Service: Endoscopy;  Laterality: N/A;   ESOPHAGOGASTRODUODENOSCOPY (EGD) WITH PROPOFOL N/A 06/14/2016   Procedure: ESOPHAGOGASTRODUODENOSCOPY (EGD) WITH PROPOFOL;  Surgeon: Midge Minium, MD;  Location: [facility];  Service: Endoscopy;  Laterality: N/A;   ESOPHAGOGASTRODUODENOSCOPY (EGD) WITH PROPOFOL N/A 11/27/2021   Procedure: ESOPHAGOGASTRODUODENOSCOPY (EGD) WITH BIOPSY;  Surgeon: Midge Minium, MD;  Location: [facility];  Service: Endoscopy;  Laterality: N/A;   HYSTEROSCOPY WITH D & C N/A 07/18/2021   Procedure: DILATATION AND CURETTAGE /HYSTEROSCOPY;  Surgeon: Vena Austria, MD;  Location: [facility] ORS;  Service: Gynecology;  Laterality: N/A;   THORACIC AORTIC ANEURYSM REPAIR N/A 11/16/2020   Procedure: 4V FEVAR (CA, SMA, RRA, LRA) WITH PROXIMAL TEVAR EXTENSION WITH BILATERAL ILIAC LIMB EXTENSIONS; Location: [facility]; Surgeon; Bennie Pierini, MD   TUBAL LIGATION     Family History  Problem Relation Age of Onset   Leukemia Mother    Aneurysm Other    Social History   Tobacco Use   Smoking status: Every Day    Current packs/day: 1.00    Average packs/day: 1 pack/day for 30.0 years (30.0 ttl pk-yrs)    Types: Cigarettes   Smokeless tobacco: Never   Tobacco comments:    1 pack daily  Substance Use Topics   Alcohol use: Not Currently   Pertinent Clinical Results:  LABS:  Lab Results   Component Value Date   WBC 6.0 11/29/2023   HGB 13.0 11/29/2023   HCT 39.0 11/29/2023   MCV 97.0 11/29/2023   PLT 132 (L) 11/29/2023   Lab Results  Component Value Date   NA 136 11/29/2023   CL 105 11/29/2023   K 4.0 11/29/2023   CO2 20 (L) 11/29/2023   BUN 26 (H) 11/29/2023   CREATININE 1.14 (H) 11/29/2023   GFRNONAA 52 (L) 11/29/2023   CALCIUM 8.5 (L) 11/29/2023   ALBUMIN 3.6 11/29/2023   GLUCOSE 93 11/29/2023    ECG: Date: 01/08/2024  Time ECG obtained: 1424 PM Rate: 69 bpm Rhythm: normal sinus Axis (leads I and aVF): normal Intervals: PR 192 ms. QRS 72 ms. QTc 417 ms. ST segment and T wave changes: No evidence of acute T wave abnormalities or significant ST segment elevation or depression.  Evidence of a possible, age undetermined, prior infarct:  No Comparison: Similar to previous tracing obtained on 11/29/2019   IMAGING / PROCEDURES: CT ANGIO CHEST/ABD/PEL FOR DISSECTION W AND/OR WO CONTRAST performed on 11/29/2023 Fusiform aneurysm of the distal aortic arch and descending thoracic aorta, status post stent endograft repair. No evidence of endoleak. Maximum caliber of the unstented proximal descending thoracic aorta 3.4 x 3.4 cm. Fusiform aneurysm of the abdominal aorta, contiguous with the  descending thoracic aorta, status post overlapping aortobiiliac stent endograft repair, with snorkel stenting of the aortic branch vessels. 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 The superior mesenteric and bilateral renal artery stents are patent. No evidence of endoleak. Emphysema and diffuse bilateral bronchial wall thickening. Large hiatal hernia with intrathoracic position of the gastric body and fundus. Coronary artery disease.   TRANSTHORACIC ECHOCARDIOGRAM performed on 12/25/2022 Left ventricular ejection fraction, by estimation, is 60 to 65%. The left ventricle has normal  function. The left ventricle has no regional wall motion abnormalities. Left ventricular diastolic parameters are consistent with Grade I diastolic dysfunction (impaired relaxation). The average left ventricular global longitudinal strain is -18.7 %.  Right ventricular systolic function is normal. The right ventricular size is normal. Tricuspid regurgitation signal is inadequate for assessing PA pressure.  The mitral valve is normal in structure. Mild mitral valve regurgitation. No evidence of mitral stenosis.  The aortic valve is tricuspid. Aortic valve regurgitation is not visualized. Aortic valve sclerosis/calcification is present, without any evidence of aortic stenosis.  The inferior vena cava is normal in size with greater than 50% respiratory variability, suggesting right atrial pressure of 3 mmHg.   MYOCARDIAL PERFUSION IMAGING STUDY (LEXISCAN) performed on 12/05/2022 Left ventricular function is normal.  Calculated LVEF = 86% LV perfusion is normal. LAD and RCA calcifications noted. There is no evidence for ischemia The study is low risk.  Impression and Plan:  Joy Carpenter has been referred for pre-anesthesia review and clearance prior to her undergoing the planned anesthetic and procedural courses. Available labs, pertinent testing, and imaging results were personally reviewed by me in preparation for upcoming operative/procedural course. [facility] medical record has been updated following extensive record review and patient interview with PAT staff.   This patient has been appropriately cleared by cardiology with an overall HIGH/ACCEPTABLE risk of experiencing significant perioperative cardiovascular complications. Based on clinical review performed today (01/09/24), barring any significant acute changes in the patient's overall condition, it is anticipated that she will be able to proceed with the planned surgical intervention. Any acute changes in clinical condition may  necessitate her procedure being postponed and/or cancelled. Patient will meet with anesthesia team (MD and/or CRNA) on the day of her procedure for preoperative evaluation/assessment. Questions regarding anesthetic course will be fielded at that time.   Pre-surgical instructions were reviewed with the patient during his PAT appointment, and questions were fielded to satisfaction by PAT clinical staff. She has been instructed on which medications that she will need to hold prior to surgery, as well as the ones that have been deemed safe/appropriate to take on the day of her procedure. As part of the general education provided by PAT, patient made aware both verbally and in writing, that she would need to abstain from the use of any illegal substances during her perioperative course. She was advised that failure to follow the provided instructions could necessitate case cancellation or result in serious perioperative complications up to and including death. Patient encouraged to contact PAT and/or her surgeon's office to discuss any questions or concerns that may arise prior to surgery; verbalized understanding.   Quentin Mulling, MSN, APRN, FNP-C, CEN [facility]  Perioperative Services Nurse Practitioner Phone: [phone] Fax: [phone] 01/09/24 1:46 PM  NOTE: This note has been prepared using Dragon dictation software. Despite my best ability to proofread, there is always the potential that unintentional transcriptional errors  may still occur from this process.

## 2024-01-15 MED ORDER — LACTATED RINGERS IV SOLN
INTRAVENOUS | Status: DC
Start: 2024-01-15 — End: 2024-01-16

## 2024-01-15 MED ORDER — ACETAMINOPHEN 500 MG PO TABS
1000.0000 mg | ORAL_TABLET | ORAL | Status: AC
Start: 2024-01-16 — End: 2024-01-17
  Administered 2024-01-16: 1000 mg via ORAL

## 2024-01-15 MED ORDER — ORAL CARE MOUTH RINSE: 15.0000 mL | Freq: Once | OROMUCOSAL | Status: AC

## 2024-01-15 MED ORDER — CHLORHEXIDINE GLUCONATE CLOTH 2 % EX PADS: 6.0000 | MEDICATED_PAD | Freq: Once | CUTANEOUS | Status: DC

## 2024-01-15 MED ORDER — GABAPENTIN 300 MG PO CAPS
300.0000 mg | ORAL_CAPSULE | ORAL | Status: AC
  Administered 2024-01-16: 300 mg via ORAL

## 2024-01-15 MED ORDER — CEFAZOLIN SODIUM-DEXTROSE 2-4 GM/100ML-% IV SOLN
2.0000 g | INTRAVENOUS | Status: AC
Start: 2024-01-16 — End: 2024-01-17
  Administered 2024-01-16: 2 g via INTRAVENOUS

## 2024-01-15 MED ORDER — HEPARIN SODIUM (PORCINE) 5000 UNIT/ML IJ SOLN
5000.0000 [IU] | Freq: Once | INTRAMUSCULAR | Status: AC
  Administered 2024-01-16: 5000 [IU] via SUBCUTANEOUS

## 2024-01-15 MED ORDER — CHLORHEXIDINE GLUCONATE 0.12 % MT SOLN
15.0000 mL | Freq: Once | OROMUCOSAL | Status: AC
  Administered 2024-01-16: 15 mL via OROMUCOSAL

## 2024-01-15 MED ORDER — CELECOXIB 200 MG PO CAPS
200.0000 mg | ORAL_CAPSULE | ORAL | Status: AC
Start: 2024-01-16 — End: 2024-01-17
  Administered 2024-01-16: 200 mg via ORAL

## 2024-01-16 ENCOUNTER — Other Ambulatory Visit: Payer: Self-pay

## 2024-01-16 ENCOUNTER — Ambulatory Visit: Payer: Self-pay | Admitting: Urgent Care

## 2024-01-16 ENCOUNTER — Encounter
Admission: RE | Disposition: A | Discharge status: Home / Self Care | Payer: Self-pay | Source: Ambulatory Visit | Attending: Surgery

## 2024-01-16 ENCOUNTER — Encounter: Payer: Self-pay | Admitting: Surgery

## 2024-01-16 ENCOUNTER — Observation Stay
Admission: RE | Admit: 2024-01-16 | Discharge: 2024-01-17 | Disposition: A | Discharge status: Home / Self Care | Source: Ambulatory Visit | Attending: Surgery | Admitting: Surgery

## 2024-01-16 DIAGNOSIS — F418 Other specified anxiety disorders: Secondary | ICD-10-CM | POA: Diagnosis not present

## 2024-01-16 DIAGNOSIS — R1310 Dysphagia, unspecified: Secondary | ICD-10-CM | POA: Insufficient documentation

## 2024-01-16 DIAGNOSIS — K449 Diaphragmatic hernia without obstruction or gangrene: Principal | ICD-10-CM | POA: Insufficient documentation

## 2024-01-16 DIAGNOSIS — Z85828 Personal history of other malignant neoplasm of skin: Secondary | ICD-10-CM | POA: Diagnosis not present

## 2024-01-16 DIAGNOSIS — N189 Chronic kidney disease, unspecified: Secondary | ICD-10-CM | POA: Diagnosis not present

## 2024-01-16 DIAGNOSIS — E039 Hypothyroidism, unspecified: Secondary | ICD-10-CM | POA: Insufficient documentation

## 2024-01-16 DIAGNOSIS — F17210 Nicotine dependence, cigarettes, uncomplicated: Secondary | ICD-10-CM | POA: Insufficient documentation

## 2024-01-16 DIAGNOSIS — I129 Hypertensive chronic kidney disease with stage 1 through stage 4 chronic kidney disease, or unspecified chronic kidney disease: Secondary | ICD-10-CM | POA: Insufficient documentation

## 2024-01-16 DIAGNOSIS — Z8719 Personal history of other diseases of the digestive system: Principal | ICD-10-CM

## 2024-01-16 DIAGNOSIS — I2510 Atherosclerotic heart disease of native coronary artery without angina pectoris: Secondary | ICD-10-CM | POA: Insufficient documentation

## 2024-01-16 HISTORY — DX: Long term (current) use of aspirin: Z79.82

## 2024-01-16 HISTORY — DX: Other nonspecific abnormal finding of lung field: R91.8

## 2024-01-16 HISTORY — DX: Thoracoabdominal aortic aneurysm, without rupture, unspecified: I71.60

## 2024-01-16 HISTORY — DX: Other ill-defined heart diseases: I51.89

## 2024-01-16 HISTORY — DX: Pain disorder exclusively related to psychological factors: F45.41

## 2024-01-16 SURGERY — REPAIR, HERNIA, PARAESOPHAGEAL, ROBOT-ASSISTED
Anesthesia: General | Site: Abdomen

## 2024-01-16 MED ORDER — ZOLPIDEM TARTRATE 5 MG PO TABS: 5.0000 mg | ORAL_TABLET | Freq: Every evening | ORAL | Status: DC | PRN

## 2024-01-16 MED ORDER — LACTATED RINGERS IV SOLN: INTRAVENOUS | Status: DC | PRN

## 2024-01-16 MED ORDER — METHOCARBAMOL 1000 MG/10ML IJ SOLN
500.0000 mg | Freq: Once | INTRAMUSCULAR | Status: AC
  Administered 2024-01-16: 500 mg via INTRAVENOUS

## 2024-01-16 MED ORDER — OXYCODONE HCL 5 MG PO TABS
ORAL_TABLET | ORAL | Status: AC
  Filled 2024-01-16: qty 1

## 2024-01-16 MED ORDER — PHENYLEPHRINE 80 MCG/ML (10ML) SYRINGE FOR IV PUSH (FOR BLOOD PRESSURE SUPPORT)
PREFILLED_SYRINGE | INTRAVENOUS | Status: DC | PRN
  Administered 2024-01-16: 80 ug via INTRAVENOUS
  Administered 2024-01-16: 160 ug via INTRAVENOUS
  Administered 2024-01-16: 80 ug via INTRAVENOUS
  Administered 2024-01-16 (×4): 160 ug via INTRAVENOUS

## 2024-01-16 MED ORDER — OXYCODONE HCL 5 MG PO TABS
5.0000 mg | ORAL_TABLET | ORAL | Status: DC | PRN
  Administered 2024-01-16 – 2024-01-17 (×3): 10 mg via ORAL
  Filled 2024-01-16 (×3): qty 2

## 2024-01-16 MED ORDER — INDOCYANINE GREEN 25 MG IV SOLR
INTRAVENOUS | Status: DC | PRN
  Administered 2024-01-16: 25 mg via TOPICAL

## 2024-01-16 MED ORDER — DEXAMETHASONE SODIUM PHOSPHATE 10 MG/ML IJ SOLN
INTRAMUSCULAR | Status: DC | PRN
  Administered 2024-01-16: 10 mg via INTRAVENOUS

## 2024-01-16 MED ORDER — LEVOTHYROXINE SODIUM 50 MCG PO TABS
50.0000 ug | ORAL_TABLET | Freq: Every morning | ORAL | Status: DC
  Administered 2024-01-17: 50 ug via ORAL
  Filled 2024-01-16: qty 1

## 2024-01-16 MED ORDER — VISTASEAL 10 ML SINGLE DOSE KIT
PACK | CUTANEOUS | Status: DC | PRN
  Administered 2024-01-16: 10 mL via TOPICAL

## 2024-01-16 MED ORDER — LIDOCAINE HCL (PF) 2 % IJ SOLN
INTRAMUSCULAR | Status: AC
  Filled 2024-01-16: qty 5

## 2024-01-16 MED ORDER — CEFAZOLIN SODIUM-DEXTROSE 2-4 GM/100ML-% IV SOLN
2.0000 g | Freq: Three times a day (TID) | INTRAVENOUS | Status: AC
  Administered 2024-01-16 – 2024-01-17 (×3): 2 g via INTRAVENOUS
  Filled 2024-01-16 (×3): qty 100

## 2024-01-16 MED ORDER — PHENYLEPHRINE 80 MCG/ML (10ML) SYRINGE FOR IV PUSH (FOR BLOOD PRESSURE SUPPORT)
PREFILLED_SYRINGE | INTRAVENOUS | Status: AC
  Filled 2024-01-16: qty 10

## 2024-01-16 MED ORDER — ASPIRIN 81 MG PO CHEW
81.0000 mg | CHEWABLE_TABLET | Freq: Every day | ORAL | Status: DC
  Administered 2024-01-17: 81 mg via ORAL
  Filled 2024-01-16: qty 1

## 2024-01-16 MED ORDER — FENTANYL CITRATE (PF) 100 MCG/2ML IJ SOLN
25.0000 ug | INTRAMUSCULAR | Status: DC | PRN
  Administered 2024-01-16 (×4): 25 ug via INTRAVENOUS

## 2024-01-16 MED ORDER — GABAPENTIN 300 MG PO CAPS
ORAL_CAPSULE | ORAL | Status: AC
  Filled 2024-01-16: qty 1

## 2024-01-16 MED ORDER — BUPIVACAINE LIPOSOME 1.3 % IJ SUSP
INTRAMUSCULAR | Status: AC
  Filled 2024-01-16: qty 20

## 2024-01-16 MED ORDER — ACETAMINOPHEN 500 MG PO TABS
ORAL_TABLET | ORAL | Status: AC
  Filled 2024-01-16: qty 2

## 2024-01-16 MED ORDER — ONDANSETRON HCL 4 MG/2ML IJ SOLN: 4.0000 mg | Freq: Four times a day (QID) | INTRAMUSCULAR | Status: DC | PRN

## 2024-01-16 MED ORDER — DIPHENHYDRAMINE HCL 50 MG/ML IJ SOLN: 12.5000 mg | Freq: Four times a day (QID) | INTRAMUSCULAR | Status: DC | PRN

## 2024-01-16 MED ORDER — MORPHINE SULFATE (PF) 2 MG/ML IV SOLN: 2.0000 mg | INTRAVENOUS | Status: DC | PRN

## 2024-01-16 MED ORDER — ONDANSETRON 4 MG PO TBDP: 4.0000 mg | ORAL_TABLET | Freq: Four times a day (QID) | ORAL | Status: DC | PRN

## 2024-01-16 MED ORDER — LIDOCAINE HCL (CARDIAC) PF 100 MG/5ML IV SOSY
PREFILLED_SYRINGE | INTRAVENOUS | Status: DC | PRN
  Administered 2024-01-16: 50 mg via INTRAVENOUS

## 2024-01-16 MED ORDER — MIDAZOLAM HCL 2 MG/2ML IJ SOLN
INTRAMUSCULAR | Status: AC
  Filled 2024-01-16: qty 2

## 2024-01-16 MED ORDER — ONDANSETRON HCL 4 MG/2ML IJ SOLN
INTRAMUSCULAR | Status: DC | PRN
Start: 2024-01-16 — End: 2024-01-16
  Administered 2024-01-16: 4 mg via INTRAVENOUS

## 2024-01-16 MED ORDER — PROCHLORPERAZINE EDISYLATE 10 MG/2ML IJ SOLN: 5.0000 mg | Freq: Four times a day (QID) | INTRAMUSCULAR | Status: DC | PRN

## 2024-01-16 MED ORDER — HEPARIN SODIUM (PORCINE) 5000 UNIT/ML IJ SOLN
INTRAMUSCULAR | Status: AC
  Filled 2024-01-16: qty 1

## 2024-01-16 MED ORDER — ONDANSETRON HCL 4 MG/2ML IJ SOLN
INTRAMUSCULAR | Status: AC
  Filled 2024-01-16: qty 2

## 2024-01-16 MED ORDER — BUPIVACAINE-EPINEPHRINE (PF) 0.25% -1:200000 IJ SOLN
INTRAMUSCULAR | Status: AC
  Filled 2024-01-16: qty 30

## 2024-01-16 MED ORDER — SUGAMMADEX SODIUM 200 MG/2ML IV SOLN
INTRAVENOUS | Status: DC | PRN
  Administered 2024-01-16: 200 mg via INTRAVENOUS

## 2024-01-16 MED ORDER — PROCHLORPERAZINE MALEATE 10 MG PO TABS
10.0000 mg | ORAL_TABLET | Freq: Four times a day (QID) | ORAL | Status: DC | PRN
  Filled 2024-01-16: qty 1

## 2024-01-16 MED ORDER — KETOROLAC TROMETHAMINE 15 MG/ML IJ SOLN
15.0000 mg | Freq: Four times a day (QID) | INTRAMUSCULAR | Status: DC
  Administered 2024-01-16 – 2024-01-17 (×4): 15 mg via INTRAVENOUS
  Filled 2024-01-16 (×4): qty 1

## 2024-01-16 MED ORDER — ALPRAZOLAM 0.5 MG PO TABS: 0.5000 mg | ORAL_TABLET | Freq: Three times a day (TID) | ORAL | Status: DC | PRN

## 2024-01-16 MED ORDER — BUPIVACAINE LIPOSOME 1.3 % IJ SUSP
INTRAMUSCULAR | Status: DC | PRN
  Administered 2024-01-16: 20 mL

## 2024-01-16 MED ORDER — SODIUM CHLORIDE 0.9 % IV SOLN: INTRAVENOUS | Status: DC

## 2024-01-16 MED ORDER — ROCURONIUM BROMIDE 100 MG/10ML IV SOLN
INTRAVENOUS | Status: DC | PRN
Start: 2024-01-16 — End: 2024-01-16
  Administered 2024-01-16: 20 mg via INTRAVENOUS
  Administered 2024-01-16: 30 mg via INTRAVENOUS
  Administered 2024-01-16: 20 mg via INTRAVENOUS

## 2024-01-16 MED ORDER — MIDAZOLAM HCL 2 MG/2ML IJ SOLN
INTRAMUSCULAR | Status: DC | PRN
Start: 2024-01-16 — End: 2024-01-16
  Administered 2024-01-16 (×2): 1 mg via INTRAVENOUS

## 2024-01-16 MED ORDER — ACETAMINOPHEN 500 MG PO TABS
1000.0000 mg | ORAL_TABLET | Freq: Four times a day (QID) | ORAL | Status: DC
  Administered 2024-01-16 – 2024-01-17 (×3): 1000 mg via ORAL
  Filled 2024-01-16 (×3): qty 2

## 2024-01-16 MED ORDER — AMLODIPINE BESYLATE 5 MG PO TABS
2.5000 mg | ORAL_TABLET | Freq: Every day | ORAL | Status: DC
  Administered 2024-01-17: 2.5 mg via ORAL
  Filled 2024-01-16: qty 1

## 2024-01-16 MED ORDER — FENTANYL CITRATE (PF) 100 MCG/2ML IJ SOLN
INTRAMUSCULAR | Status: DC | PRN
  Administered 2024-01-16 (×2): 50 ug via INTRAVENOUS

## 2024-01-16 MED ORDER — FENTANYL CITRATE (PF) 100 MCG/2ML IJ SOLN
INTRAMUSCULAR | Status: AC
  Filled 2024-01-16: qty 2

## 2024-01-16 MED ORDER — ENOXAPARIN SODIUM 40 MG/0.4ML IJ SOSY: 40.0000 mg | PREFILLED_SYRINGE | INTRAMUSCULAR | Status: DC

## 2024-01-16 MED ORDER — ROCURONIUM BROMIDE 10 MG/ML (PF) SYRINGE
PREFILLED_SYRINGE | INTRAVENOUS | Status: AC
  Filled 2024-01-16: qty 10

## 2024-01-16 MED ORDER — EPHEDRINE SULFATE-NACL 50-0.9 MG/10ML-% IV SOSY
PREFILLED_SYRINGE | INTRAVENOUS | Status: DC | PRN
  Administered 2024-01-16 (×3): 5 mg via INTRAVENOUS
  Administered 2024-01-16: 10 mg via INTRAVENOUS

## 2024-01-16 MED ORDER — HYDRALAZINE HCL 20 MG/ML IJ SOLN: 10.0000 mg | INTRAMUSCULAR | Status: DC | PRN

## 2024-01-16 MED ORDER — METHOCARBAMOL 1000 MG/10ML IJ SOLN
INTRAMUSCULAR | Status: AC
  Filled 2024-01-16: qty 10

## 2024-01-16 MED ORDER — PROPOFOL 10 MG/ML IV BOLUS
INTRAVENOUS | Status: DC | PRN
  Administered 2024-01-16: 110 mg via INTRAVENOUS

## 2024-01-16 MED ORDER — BUPIVACAINE-EPINEPHRINE 0.25% -1:200000 IJ SOLN
INTRAMUSCULAR | Status: DC | PRN
  Administered 2024-01-16: 30 mL

## 2024-01-16 MED ORDER — ONDANSETRON HCL 4 MG/2ML IJ SOLN
4.0000 mg | Freq: Once | INTRAMUSCULAR | Status: AC
Start: 2024-01-16 — End: 2024-01-16
  Administered 2024-01-16: 4 mg via INTRAVENOUS

## 2024-01-16 MED ORDER — OXYCODONE HCL 5 MG/5ML PO SOLN: 5.0000 mg | Freq: Once | ORAL | Status: AC | PRN

## 2024-01-16 MED ORDER — OXYCODONE HCL 5 MG PO TABS
5.0000 mg | ORAL_TABLET | Freq: Once | ORAL | Status: AC | PRN
  Administered 2024-01-16: 5 mg via ORAL

## 2024-01-16 MED ORDER — DIPHENHYDRAMINE HCL 12.5 MG/5ML PO ELIX
12.5000 mg | ORAL_SOLUTION | Freq: Four times a day (QID) | ORAL | Status: DC | PRN
  Filled 2024-01-16: qty 5

## 2024-01-16 MED ORDER — CEFAZOLIN SODIUM-DEXTROSE 2-4 GM/100ML-% IV SOLN
INTRAVENOUS | Status: AC
  Filled 2024-01-16: qty 100

## 2024-01-16 MED ORDER — CHLORHEXIDINE GLUCONATE 0.12 % MT SOLN
OROMUCOSAL | Status: AC
  Filled 2024-01-16: qty 15

## 2024-01-16 MED ORDER — FENTANYL CITRATE (PF) 100 MCG/2ML IJ SOLN
INTRAMUSCULAR | Status: AC
Start: 2024-01-16 — End: ?
  Filled 2024-01-16: qty 2

## 2024-01-16 MED ORDER — CELECOXIB 200 MG PO CAPS
ORAL_CAPSULE | ORAL | Status: AC
  Filled 2024-01-16: qty 1

## 2024-01-16 MED ORDER — VISTASEAL 10 ML SINGLE DOSE KIT
PACK | CUTANEOUS | Status: AC
  Filled 2024-01-16: qty 10

## 2024-01-16 MED ORDER — DEXAMETHASONE SODIUM PHOSPHATE 10 MG/ML IJ SOLN
INTRAMUSCULAR | Status: AC
  Filled 2024-01-16: qty 1

## 2024-01-16 SURGICAL SUPPLY — 51 items
APPLICATOR VISTASEAL 35 (MISCELLANEOUS) IMPLANT
CANNULA REDUCER 12-8 DVNC XI (CANNULA) ×2 IMPLANT
CLIP LIGATING HEM O LOK PURPLE (MISCELLANEOUS) IMPLANT
DERMABOND ADVANCED .7 DNX12 (GAUZE/BANDAGES/DRESSINGS) ×2 IMPLANT
DRAPE ARM DVNC X/XI (DISPOSABLE) ×8 IMPLANT
DRAPE COLUMN DVNC XI (DISPOSABLE) ×2 IMPLANT
ELECT REM PT RETURN 9FT ADLT (ELECTROSURGICAL) ×2 IMPLANT
ELECTRODE REM PT RTRN 9FT ADLT (ELECTROSURGICAL) ×2 IMPLANT
FORCEPS BPLR R/ABLATION 8 DVNC (INSTRUMENTS) ×2 IMPLANT
GLOVE BIO SURGEON STRL SZ7 (GLOVE) ×6 IMPLANT
GOWN STRL REUS W/ TWL LRG LVL3 (GOWN DISPOSABLE) ×8 IMPLANT
GRASPER LAPSCPC 5X45 DSP (INSTRUMENTS) ×2 IMPLANT
GRASPER TIP-UP FEN DVNC XI (INSTRUMENTS) ×2 IMPLANT
IRRIGATION STRYKERFLOW (MISCELLANEOUS) IMPLANT
IRRIGATOR STRYKERFLOW (MISCELLANEOUS) ×2 IMPLANT
IV NS 1000ML BAXH (IV SOLUTION) IMPLANT
KIT IMAGING PINPOINTPAQ (MISCELLANEOUS) ×2 IMPLANT
KIT PINK PAD W/HEAD ARE REST (MISCELLANEOUS) ×2 IMPLANT
KIT PINK PAD W/HEAD ARM REST (MISCELLANEOUS) ×2 IMPLANT
LABEL OR SOLS (LABEL) ×2 IMPLANT
MANIFOLD NEPTUNE II (INSTRUMENTS) ×2 IMPLANT
MESH BIO-A 7X10 SYN MAT (Mesh General) IMPLANT
NDL DRIVE SUT CUT DVNC (INSTRUMENTS) ×2 IMPLANT
NDL HYPO 22X1.5 SAFETY MO (MISCELLANEOUS) ×2 IMPLANT
NEEDLE DRIVE SUT CUT DVNC (INSTRUMENTS) ×2 IMPLANT
NEEDLE HYPO 22X1.5 SAFETY MO (MISCELLANEOUS) ×2 IMPLANT
OBTURATOR OPTICAL STND 8 DVNC (TROCAR) ×2 IMPLANT
OBTURATOR OPTICALSTD 8 DVNC (TROCAR) ×2 IMPLANT
PACK LAP CHOLECYSTECTOMY (MISCELLANEOUS) ×2 IMPLANT
SEAL UNIV 5-12 XI (MISCELLANEOUS) ×8 IMPLANT
SEALER VESSEL EXT DVNC XI (MISCELLANEOUS) ×2 IMPLANT
SOL ELECTROSURG ANTI STICK (MISCELLANEOUS) ×2 IMPLANT
SOLUTION ELECTROSURG ANTI STCK (MISCELLANEOUS) ×2 IMPLANT
SPIKE FLUID TRANSFER (MISCELLANEOUS) ×2 IMPLANT
SPONGE T-LAP 18X18 ~~LOC~~+RFID (SPONGE) ×2 IMPLANT
SUT MNCRL 4-0 27 PS-2 XMFL (SUTURE) ×2 IMPLANT
SUT SILK 2 0 SH (SUTURE) ×4 IMPLANT
SUT STRATA 2-0 23CM CT-2 (SUTURE) ×2 IMPLANT
SUT VIC AB 3-0 SH 27X BRD (SUTURE) IMPLANT
SUT VICRYL 0 UR6 27IN ABS (SUTURE) ×4 IMPLANT
SUTURE MNCRL 4-0 27XMF (SUTURE) ×2 IMPLANT
SYR 30ML LL (SYRINGE) ×2 IMPLANT
SYR TOOMEY IRRIG 70ML (MISCELLANEOUS) ×2 IMPLANT
SYRINGE TOOMEY IRRIG 70ML (MISCELLANEOUS) ×2 IMPLANT
SYS BAG RETRIEVAL 10MM (BASKET) IMPLANT
SYSTEM BAG RETRIEVAL 10MM (BASKET) IMPLANT
TRAP FLUID SMOKE EVACUATOR (MISCELLANEOUS) ×2 IMPLANT
TRAY FOLEY SLVR 16FR LF STAT (SET/KITS/TRAYS/PACK) ×2 IMPLANT
TROCAR Z-THREAD FIOS 5X100MM (TROCAR) ×2 IMPLANT
TUBING EVAC SMOKE HEATED PNEUM (TUBING) ×2 IMPLANT
WATER STERILE IRR 500ML POUR (IV SOLUTION) ×2 IMPLANT

## 2024-01-16 NOTE — Interval H&P Note (Signed)
 History and Physical Interval Note:  01/16/2024 9:29 AM  Joy Carpenter  has presented today for surgery, with the diagnosis of Hernia, paraesophageal.  The various methods of treatment have been discussed with the patient and family. After consideration of risks, benefits and other options for treatment, the patient has consented to  Procedure(s): REPAIR, HERNIA, PARAESOPHAGEAL, ROBOT-ASSISTED (N/A) as a surgical intervention.  The patient's history has been reviewed, patient examined, no change in status, stable for surgery.  I have reviewed the patient's chart and labs.  Questions were answered to the patient's satisfaction.     Lenix Kidd F Teona Vargus

## 2024-01-16 NOTE — Anesthesia Procedure Notes (Signed)
 Procedure Name: Intubation Date/Time: 01/16/2024 11:00 AM  Performed by: Lily Lovings, CRNAPre-anesthesia Checklist: Patient identified, Patient being monitored, Timeout performed, Emergency Drugs available and Suction available Patient Re-evaluated:Patient Re-evaluated prior to induction Oxygen Delivery Method: Circle system utilized Preoxygenation: Pre-oxygenation with 100% oxygen Induction Type: IV induction Ventilation: Mask ventilation without difficulty Laryngoscope Size: 3 and McGrath Grade View: Grade I Tube type: Oral Tube size: 7.0 mm Number of attempts: 1 Airway Equipment and Method: Stylet Placement Confirmation: ETT inserted through vocal cords under direct vision, positive ETCO2 and breath sounds checked- equal and bilateral Secured at: 21 cm Tube secured with: Tape Dental Injury: Teeth and Oropharynx as per pre-operative assessment

## 2024-01-16 NOTE — Anesthesia Preprocedure Evaluation (Signed)
 Anesthesia Evaluation  Patient identified by MRN, date of birth, ID band Patient awake    Reviewed: Allergy & Precautions, NPO status , Patient's Chart, lab work & pertinent test results  History of Anesthesia Complications Negative for: history of anesthetic complications  Airway Mallampati: II  TM Distance: >3 FB Neck ROM: full    Dental no notable dental hx.    Pulmonary COPD, Current Smoker and Patient abstained from smoking.   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 There were no regional wall motion abnormalities. Left ventricular diastolic Doppler parameters consistent with abnormal relaxation (G1DD). GLS -18.7%. Right ventricular size and function normal with a TAPSE measuring 2.6 cm  (normal range >/= 1.6 cm). Aortic valve sclerosis/calcification present. There was trivial to mild mitral and tricuspid valve regurgitation. All transvalvular gradients were noted to be normal providing no evidence suggestive of valvular stenosis. Aorta normal in size with no evidence of ectasia or aneurysmal dilatation.  Per cardiology, "Ms. Briel's perioperative risk of a major cardiac event is 0.9% according to the Revised Cardiac Risk Index (RCRI).  Therefore, she is at high risk for perioperative complications.   Her functional capacity is good at 6.05 METs according to the Duke Activity Status Index (DASI). According to ACC/AHA guidelines, no further cardiovascular testing needed.  The patient may proceed to surgery at acceptable risk".   Neuro/Psych  Headaches PSYCHIATRIC DISORDERS Anxiety Depression       GI/Hepatic Neg liver ROS, hiatal hernia,GERD  Medicated,,   Endo/Other  Hypothyroidism    Renal/GU Renal disease     Musculoskeletal   Abdominal   Peds  Hematology negative hematology ROS (+)   Anesthesia Other Findings Past Medical History: No date: Acquired hypothyroidism 03/28/2021: Actinic keratosis     Comment:  right upper arm, bx proven No date: Anxiety     Comment:  a.) on BZO (alproazolam) PRN 2019: Carpal tunnel syndrome No date: Coronary artery disease involving native coronary artery     Comment:  of the LAD and RCA noted on chest CT No date: Depression No date: Diastolic dysfunction No date: Emphysema of lung (HCC) No date: GERD (gastroesophageal reflux disease) No date: Herpes No date: History of hiatal hernia No date: Hypertensive chronic kidney disease No date: Insomnia     Comment:  a.) on hypnotic (zolpidem) PRN No date: Long-term use of aspirin therapy No date: Mixed hyperlipidemia No date: Osteoporosis 12/2023: Paraesophageal hernia No date: Primary hypertension No date: Pulmonary nodules 03/28/2021: SCC (squamous cell carcinoma)     Comment:  left dorsal hand, ED[facility] 08/01/2023: SCC (squamous cell carcinoma)     Comment:  SCC IS, L ant shoulder, [facility] 08/27/2023 08/01/2023: Squamous cell carcinoma in situ (SCCIS)     Comment:  left anterior shoulder, ED[facility] 08/27/23 No date: Stress headaches (almost daily) No date: Thoracoabdominal aortic aneurysm (HCC)     Comment:  a.) s/p 4v FEVAR (CA, SMA, RRA, LRA) and proximal TEVAR               extention with BILATERAL iliac limb extensions on               11/16/2020 2010: Vertigo No date: Vitamin D deficiency No date: Weakness of both legs (intermittent)  Past Surgical  History: No date: COLONOSCOPY 06/14/2016: COLONOSCOPY WITH PROPOFOL; N/A     Comment:  Procedure: COLONOSCOPY WITH PROPOFOL;  Surgeon: Midge Minium, MD;  Location: [facility];  Service:               Endoscopy;  Laterality: N/A; 06/14/2016: ESOPHAGOGASTRODUODENOSCOPY  (EGD) WITH PROPOFOL; N/A     Comment:  Procedure: ESOPHAGOGASTRODUODENOSCOPY (EGD) WITH               PROPOFOL;  Surgeon: Midge Minium, MD;  Location: [facility]               SURGERY [facility];  Service: Endoscopy;  Laterality: N/A; 11/27/2021: ESOPHAGOGASTRODUODENOSCOPY (EGD) WITH PROPOFOL; N/A     Comment:  Procedure: ESOPHAGOGASTRODUODENOSCOPY (EGD) WITH BIOPSY;              Surgeon: Midge Minium, MD;  Location: [facility]               [facility];  Service: Endoscopy;  Laterality: N/A; 07/18/2021: HYSTEROSCOPY WITH D & C; N/A     Comment:  Procedure: DILATATION AND CURETTAGE /HYSTEROSCOPY;                Surgeon: Vena Austria, MD;  Location: [facility] ORS;                Service: Gynecology;  Laterality: N/A; 11/16/2020: THORACIC AORTIC ANEURYSM REPAIR; N/A     Comment:  Procedure: 4V FEVAR (CA, SMA, RRA, LRA) WITH PROXIMAL               TEVAR EXTENSION WITH BILATERAL ILIAC LIMB EXTENSIONS;               Location: [facility]; Surgeon; Bennie Pierini, MD No date: TUBAL LIGATION  BMI    Body Mass Index: 20.66 kg/m      Reproductive/Obstetrics negative OB ROS                              Anesthesia Physical Anesthesia Plan  ASA: 3  Anesthesia Plan: General ETT   Post-op Pain Management: Tylenol PO (pre-op)*, Gabapentin PO (pre-op)* and Celebrex PO (pre-op)*   Induction: Intravenous  PONV Risk Score and Plan: 3 and Ondansetron, Dexamethasone and Treatment may vary due to age or medical condition  Airway Management Planned: Oral ETT  Additional Equipment:   Intra-op Plan:   Post-operative Plan: Extubation in OR  Informed Consent: I have reviewed the patients History and Physical, chart, labs and discussed the procedure including the risks, benefits and alternatives for the proposed anesthesia with the patient or authorized representative who has indicated his/her understanding and acceptance.     Dental Advisory Given  Plan Discussed with: Anesthesiologist, CRNA  and Surgeon  Anesthesia Plan Comments: (Patient consented for risks of anesthesia including but not limited to:  - adverse reactions to medications - damage to eyes, teeth, lips or other oral mucosa - nerve damage due to positioning  - sore throat or hoarseness - Damage to heart, brain, nerves, lungs, other parts of body or loss of life  Patient voiced understanding and assent.)         Anesthesia Quick Evaluation

## 2024-01-16 NOTE — Plan of Care (Signed)
  Problem: Clinical Measurements: Goal: Ability to maintain clinical measurements within normal limits will improve Outcome: Progressing Goal: Will remain free from infection Outcome: Progressing Goal: Diagnostic test results will improve Outcome: Progressing Goal: Respiratory complications will improve Outcome: Progressing Goal: Cardiovascular complication will be avoided Outcome: Progressing   Problem: Activity: Goal: Risk for activity intolerance will decrease Outcome: Progressing   Problem: Nutrition: Goal: Adequate nutrition will be maintained Outcome: Progressing   Problem: Coping: Goal: Level of anxiety will decrease Outcome: Progressing   Problem: Elimination: Goal: Will not experience complications related to bowel motility Outcome: Progressing Goal: Will not experience complications related to urinary retention Outcome: Progressing   Problem: Pain Managment: Goal: General experience of comfort will improve and/or be controlled Outcome: Progressing

## 2024-01-16 NOTE — Op Note (Signed)
 Robotic assisted laparoscopic repair of  paraesophageal  hernia with Bio-A Mesh and partial 320 degree fundoplication  Pre-operative Diagnosis: Symptomatic paraesophageal hernia  Post-operative Diagnosis: same  Procedure:  Robotic assisted laparoscopic repair of  paraesophageal  hernia with Bio-A Mesh and Partial  fundoplication  Surgeon: Sterling Big, MD FACS  Assistant: Gaston Islam, RNFA. Required due to the complexity of the case the need for exposure and lack of first assist.  Anesthesia: Gen. 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 Increasing procedural service that was substantially greater than typically required. Large Thoracoabdominal Aneurysm with ENdovascular stent Loose wrap 320 degree over 50 FR Bougie   Estimated Blood Loss: 10cc       Specimens: sac           Complications: none   Procedure Details  The patient was seen again in the Holding Room. The benefits, complications, treatment options, and expected outcomes were discussed with the patient. The risks of bleeding, infection, recurrence of symptoms, failure to resolve symptoms,  esophageal damage, Dysphagia, bowel injury, any of which could require further surgery were reviewed with the patient. The likelihood of improving the patient's symptoms with return to their baseline status is good.  The patient and/or family concurred with the proposed plan, giving informed consent.  The patient was taken to Operating Room, identified  and the procedure verified.  A Time Out was held and the above information confirmed.  Prior to the induction of general anesthesia, antibiotic prophylaxis was administered. VTE prophylaxis was in place. General endotracheal anesthesia was then administered and tolerated well. After the induction,  the abdomen was prepped with Chloraprep and draped in the sterile fashion. The patient was positioned in the supine position.  Cut down technique was used to enter the abdominal cavity and a Hasson trochar was placed after two vicryl stitches were anchored to the fascia. Pneumoperitoneum was then created with CO2 and tolerated well without any adverse changes in the patient's vital signs.  Three 8-mm ports were placed under direct vision. All skin incisions  were infiltrated with a local anesthetic agent before making the incision and placing the trocars. An additional 5 mm regular laparoscopic port was placed to assist with retraction and exposure.   The patient was positioned  in reverse Trendelenburg, robot was brought to the surgical field and docked in the standard fashion.  We made sure all the instrumentation was kept indirect view at all times and that there were no collision between the arms. I scrubbed out and went to the console.  I used a robotic arm to retract the liver, the vessel sealer on my right hand and a forced bipolar grasper on my left hand.  There is along the extra 5 mm port allow me ample exposure and the ability to perform meticulous dissection  We Started dividing the lesser omentum via the pars flaccida.  We Were able to dissect the lesser curvature of the stomach and  dissected the fundus free from the right and left crus.  We circumferentially dissected the GE junction.  The hernia sac was also completely reduced and we were able to bring the stomach into the intra-abdominal position.     Attention then was turned to the greater curvature where the short gastrics were divided with sealer device.  We were able to identify the left  crus and again were able to make sure there was a good circumferential dissection and that the hernia sac was completely excised.  We did perform a good dissection within the mediastinum to allow a complete reduction of the sac, gain esophageal  length and a to completely allow an intra-abdominal fundoplication. Note that mediastininal dissection was tedious requiring additional and unusual time, adhesion from the esophagus to the thoracic aorta were dense.  Using two strips of Bio-A as pledgets we approximated the crus with a 2-0V Stratafix suture. A bio-A 10x7 cm mesh was inserted and secured using Vistaseal.   We Asked anesthesia to initially placed ICG via the OG, no evidence of esophageal or gastric perforation were seen, Anesthesia also placed sequential Bougies from 30 to a 50 French bougie and they went easily.  We also observe trajectory of the bougies. 320 degree partial fundoplication was created with multiple 2-0 silk sutures and we placed 3 stitches taking some of the esophagus within that bite.  The fundoplication measured approximately 3-1/2 cm and he was floppy. I was very happy with the way the fundoplication laid and the repair of the hernia.  Inspection of the  upper quadrant was performed. No bleeding, bile  Or esophageal injuries leaks, or bowel injuries were noted. Robotic instruments and robotic arms were undocked in the standard fashion. All the needles were removed under direct visualization.   I scrubbed back in.  Pneumoperitoneum was released.  The periumbilical port site was closed with interrumpted 0 Vicryl sutures. 4-0 subcuticular Monocryl was used to close the skin. Liposomal marcaine was injected to all the incisions sites.  Dermabond was  applied.  The patient was then extubated and brought to the recovery room in stable condition. Sponge, lap, and needle counts were correct at closure and at the conclusion of the case.               Sterling Big, MD, FACS

## 2024-01-16 NOTE — Transfer of Care (Signed)
 Immediate Anesthesia Transfer of Care Note  Patient: Joy Carpenter  Procedure(s) Performed: REPAIR, HERNIA, PARAESOPHAGEAL, ROBOT-ASSISTED (Abdomen) INSERTION OF MESH  Patient Location: PACU  Anesthesia Type:General  Level of Consciousness: drowsy and patient cooperative  Airway & Oxygen Therapy: Patient Spontanous Breathing and Patient connected to face mask oxygen  Post-op Assessment: Report given to RN and Post -op Vital signs reviewed and stable  Post vital signs: Reviewed and stable  Last Vitals:  Vitals Value Taken Time  BP 90/79 01/16/24 1325  Temp 36.5 C 01/16/24 1325  Pulse 99 01/16/24 1325  Resp 16 01/16/24 1325  SpO2 100 % 01/16/24 1325  Vitals shown include unfiled device data.  Last Pain:  Vitals:   01/16/24 1325  TempSrc:   PainSc: 0-No pain         Complications: No notable events documented.

## 2024-01-16 NOTE — Anesthesia Postprocedure Evaluation (Signed)
 Anesthesia Post Note  Patient: Aspasia Rude  Procedure(s) Performed: REPAIR, HERNIA, PARAESOPHAGEAL, ROBOT-ASSISTED (Abdomen) INSERTION OF MESH  Patient location during evaluation: PACU Anesthesia Type: General Level of consciousness: awake and alert Pain management: pain level controlled Vital Signs Assessment: post-procedure vital signs reviewed and stable Respiratory status: spontaneous breathing, nonlabored ventilation, respiratory function stable and patient connected to nasal cannula oxygen Cardiovascular status: blood pressure returned to baseline and stable Postop Assessment: no apparent nausea or vomiting Anesthetic complications: no   No notable events documented.   Last Vitals:  Vitals:   01/16/24 1340 01/16/24 1400  BP:  108/87  Pulse: 92 95  Resp: 15 17  Temp:  36.7 C  SpO2: 100% 100%    Last Pain:  Vitals:   01/16/24 1400  TempSrc:   PainSc: Asleep                 Louie Boston

## 2024-01-17 ENCOUNTER — Encounter: Payer: Self-pay | Admitting: Surgery

## 2024-01-17 DIAGNOSIS — K449 Diaphragmatic hernia without obstruction or gangrene: Secondary | ICD-10-CM | POA: Diagnosis not present

## 2024-01-17 DIAGNOSIS — Z85828 Personal history of other malignant neoplasm of skin: Secondary | ICD-10-CM | POA: Diagnosis not present

## 2024-01-17 DIAGNOSIS — N189 Chronic kidney disease, unspecified: Secondary | ICD-10-CM | POA: Diagnosis not present

## 2024-01-17 DIAGNOSIS — R1310 Dysphagia, unspecified: Secondary | ICD-10-CM | POA: Diagnosis not present

## 2024-01-17 DIAGNOSIS — I2510 Atherosclerotic heart disease of native coronary artery without angina pectoris: Secondary | ICD-10-CM | POA: Diagnosis not present

## 2024-01-17 DIAGNOSIS — F17210 Nicotine dependence, cigarettes, uncomplicated: Secondary | ICD-10-CM | POA: Diagnosis not present

## 2024-01-17 DIAGNOSIS — E039 Hypothyroidism, unspecified: Secondary | ICD-10-CM | POA: Diagnosis not present

## 2024-01-17 DIAGNOSIS — I129 Hypertensive chronic kidney disease with stage 1 through stage 4 chronic kidney disease, or unspecified chronic kidney disease: Secondary | ICD-10-CM | POA: Diagnosis not present

## 2024-01-17 LAB — COMPREHENSIVE METABOLIC PANEL WITH GFR
ALT: 21 U/L (ref 0–44)
AST: 30 U/L (ref 15–41)
Albumin: 3.3 g/dL — ABNORMAL LOW (ref 3.5–5.0)
Alkaline Phosphatase: 84 U/L (ref 38–126)
Anion gap: 8 (ref 5–15)
BUN: 16 mg/dL (ref 8–23)
CO2: 25 mmol/L (ref 22–32)
Calcium: 8.2 mg/dL — ABNORMAL LOW (ref 8.9–10.3)
Chloride: 101 mmol/L (ref 98–111)
Creatinine, Ser: 1.02 mg/dL — ABNORMAL HIGH (ref 0.44–1.00)
GFR, Estimated: 59 mL/min — ABNORMAL LOW (ref >60)
Glucose, Bld: 120 mg/dL — ABNORMAL HIGH (ref 70–99)
Potassium: 4.7 mmol/L (ref 3.5–5.1)
Sodium: 134 mmol/L — ABNORMAL LOW (ref 135–145)
Total Bilirubin: 0.6 mg/dL (ref 0.0–1.2)
Total Protein: 5.8 g/dL — ABNORMAL LOW (ref 6.5–8.1)

## 2024-01-17 LAB — CBC
HCT: 32.3 % — ABNORMAL LOW (ref 36.0–46.0)
Hemoglobin: 11 g/dL — ABNORMAL LOW (ref 12.0–15.0)
MCH: 33.1 pg (ref 26.0–34.0)
MCHC: 34.1 g/dL (ref 30.0–36.0)
MCV: 97.3 fL (ref 80.0–100.0)
Platelets: 135 10*3/uL — ABNORMAL LOW (ref 150–400)
RBC: 3.32 MIL/uL — ABNORMAL LOW (ref 3.87–5.11)
RDW: 13.2 % (ref 11.5–15.5)
WBC: 12.5 10*3/uL — ABNORMAL HIGH (ref 4.0–10.5)
nRBC: 0 % (ref 0.0–0.2)

## 2024-01-17 LAB — SURGICAL PATHOLOGY

## 2024-01-17 LAB — HIV ANTIBODY (ROUTINE TESTING W REFLEX): HIV Screen 4th Generation wRfx: NONREACTIVE

## 2024-01-17 MED ORDER — OXYCODONE HCL 5 MG PO TABS: 5.0000 mg | ORAL_TABLET | Freq: Four times a day (QID) | ORAL | 0 refills | Status: DC | PRN

## 2024-01-17 NOTE — TOC CM/SW Note (Signed)
 Transition of Care [facility]) - Inpatient Brief Assessment   Patient Details  Name: Joy Carpenter MRN: [id number] Date of Birth: Mar 10, 1951  [facility] CM/SW Contact:    Margarito Liner, LCSW Phone Number: 01/17/2024, 8:36 AM   Clinical Narrative: Patient has orders to discharge home today. Chart reviewed. SDOH flag for housing. Resources added to AVS. No other TOC needs identified. CSW signing off.  Transition of Care Asessment: Insurance and Status: Insurance coverage has been reviewed Patient has primary care physician: Yes Home environment has been reviewed: Single family home Prior level of function:: Not documented Prior/Current Home Services: No current home services Social Drivers of Health Review: SDOH reviewed interventions complete Readmission risk has been reviewed: Yes Transition of care needs: no transition of care needs at this time

## 2024-01-17 NOTE — Discharge Summary (Addendum)
 [facility] SURGICAL DISCHARGE SUMMARY  Patient ID: Joy Carpenter MRN: [id number] DOB/AGE: 1951-05-24 73 y.o.  Admit date: 01/16/2024 Discharge date: 01/17/2024  Discharge Diagnoses Patient Active Problem List   Diagnosis Date Noted   S/P repair of paraesophageal hernia 01/16/2024    Consultants None  Procedures 01/16/2024:  Robotic assisted laparoscopic paraesophageal hernia repair with partial fundoplication   HPI: Joy Carpenter is a 73 y.o. female with history of symptomatic paraesophageal hernia who presents to [facility] on 04/10 for scheduled repair   Hospital Course: Informed consent was obtained and documented, and patient underwent uneventful robotic assisted laparoscopic paraesophageal hernia repair with partial fundoplication (Dr Everlene Farrier, 01/16/2024).  Post-operatively, patient did well, no trouble swallowing. Advancement of patient's diet and ambulation were well-tolerated. The remainder of patient's hospital course was essentially unremarkable, and discharge planning was initiated accordingly with patient safely able to be discharged home with appropriate discharge instructions, pain control, and outpatient follow-up after all of her questions were answered to her expressed satisfaction.   Discharge Condition: Good   Physical Examination:  Constitutional: Well appearing female, NAD Pulmonary: Normal effort, no respiratory distress Gastrointestinal: Soft, incisional soreness, non-distended, no rebound/guarding Skin: Laparoscopic incisions are CDI with dermabond, no erythema or drainage    Allergies as of 01/17/2024   No Known Allergies      Medication List     PAUSE taking these medications    pantoprazole 40 MG tablet Wait to take this until your doctor or other care provider tells you to start again. Commonly known as: PROTONIX TAKE 1 TABLET BY MOUTH EVERY DAY       TAKE these medications    ALPRAZolam 0.25 MG  tablet Commonly known as: XANAX TAKE 1 TABLET BY MOUTH AS NEEDED FOR ANXIETY   amLODipine 2.5 MG tablet Commonly known as: NORVASC TAKE ONE TABLET BY MOUTH ONCE DAILY hypertension   aspirin 81 MG chewable tablet Chew 81 mg by mouth daily.   atorvastatin 40 MG tablet Commonly known as: LIPITOR Take 1 tablet (40 mg total) by mouth daily.   levothyroxine 50 MCG tablet Commonly known as: SYNTHROID Take 1 tablet (50 mcg total) by mouth every morning.   losartan 25 MG tablet Commonly known as: COZAAR Take 0.5 tablets (12.5 mg total) by mouth daily.   oxyCODONE 5 MG immediate release tablet Commonly known as: Oxy IR/ROXICODONE Take 1 tablet (5 mg total) by mouth every 6 (six) hours as needed for severe pain (pain score 7-10) or breakthrough pain.   traMADol 50 MG tablet Commonly known as: ULTRAM TAKE 1 TABLET BY MOUTH EVERY DAY AS NEEDED   valACYclovir 500 MG tablet Commonly known as: VALTREX TAKE 1 TABLET (500 MG TOTAL) BY MOUTH DAILY.   zolpidem 10 MG tablet Commonly known as: AMBIEN TAKE 1 TABLET BY MOUTH EVERYDAY AT BEDTIME What changed:  how much to take how to take this when to take this reasons to take this additional instructions          Follow-up Information     Leafy Ro, MD. Go on 02/03/2024.   Specialty: General Surgery Why: Go to appointment on 04/28 at 900 AM Contact information: [address] [place] [address] [phone]                  Time spent on discharge management including discussion of hospital course, clinical condition, outpatient instructions, prescriptions, and follow up with the patient and members of the medical team: >30 minutes  --  Lynden Oxford , PA-C [facility]  01/17/2024, 9:33 AM [phone] M-F: 7am - 4pm

## 2024-01-17 NOTE — Plan of Care (Signed)

## 2024-01-17 NOTE — TOC Transition Note (Signed)
 Transition of Care [facility]) - Discharge Note   Patient Details  Name: Joy Carpenter MRN: [id number] Date of Birth: [birth year]  [facility]) CM/SW Contact:  Alesia Richards, RN 01/17/2024, 9:36 AM   Clinical Narrative:     Discharge orders noted. CM to patient's room regarding pending discharge. Patient verbalized readiness to discharge. Per patient, patient's granddaughter, Milderd Meager, will provide transportation upon discharge and caregiver support. No verbalized concerns or questions.   Patient Goals and CMS Choice    Home/self care   Discharge Placement        Home/self care         Discharge Plan and Services Additional resources added to the After Visit Summary for       Social Drivers of Health (SDOH) Interventions SDOH Screenings   Food Insecurity: No Food Insecurity (01/16/2024)  Housing: High Risk (01/16/2024)  Transportation Needs: Unknown (01/16/2024)  Utilities: Not At Risk (01/16/2024)  Alcohol Screen: Low Risk  (04/22/2023)  Depression (PHQ2-9): Low Risk  (08/01/2023)  Financial Resource Strain: Low Risk  (11/29/2023)   Received from [facility]  Tobacco Use: High Risk (01/16/2024)     Readmission Risk Interventions     No data to display

## 2024-01-17 NOTE — Discharge Instructions (Addendum)
 In addition to included general post-operative instructions,  Diet: Recommend following Nissen diet restrictions for 4 weeks. Hand out given   Activity: No heavy lifting >20 pounds (children, pets, laundry, garbage) or strenuous activity for 4 weeks, but light activity and walking are encouraged. Do not drive or drink alcohol if taking narcotic pain medications or having pain that might distract from driving.  Wound care: 2 days after surgery (04/12), you may shower/get incision wet with soapy water and pat dry (do not rub incisions), but no baths or submerging incision underwater until follow-up.   Medications: Resume all home medications. For mild to moderate pain: acetaminophen (Tylenol) or ibuprofen/naproxen (if no kidney disease). Combining Tylenol with alcohol can substantially increase your risk of causing liver disease. Narcotic pain medications, if prescribed, can be used for severe pain, though may cause nausea, constipation, and drowsiness. Do not combine Tylenol and Percocet (or similar) within a 6 hour period as Percocet (and similar) contain(s) Tylenol. If you do not need the narcotic pain medication, you do not need to fill the prescription.  Call office [phone] / [phone]) at any time if any questions, worsening pain, fevers/chills, bleeding, drainage from incision site, or other concerns.    Rent/Utility/Housing  Agency Name: [facility] Address: [address] [place], [place] [address] Phone: [phone] Email: [email or web address] Website: www.[email or web address].org Service(s) Offered: Housing services, self-sufficiency, congregate meal program, weatherization program, Field seismologist program, emergency food assistance,  housing counseling, home ownership program, wheels -towork program.  Agency Name: [facility] Address: [address], [place], [place] [address] Phone: [phone] (8a-4p)  [phone] (8p- 10p) Email: [email or web address] Website: [email or web address].[email or web address].org Service(s) Offered: A program for homeless and/or needy men that includes one-on-one counseling, life skills training and job rehabilitation.  Agency Name: [facility] [place] Address: [address] [place], [place] [address] Phone: [phone] Website: www.alliedchurches.org Service(s) Offered: Assistance to needy in emergency with utility bills, heating fuel, and prescriptions. Shelter for homeless 7pm-7am. January 31, 2017 15  Agency Name: [facility] [place] [facility]Developmentally Disabled[facility] Address: [address], [place], [place] [address] Phone: [phone] Contact Person: Cathleen Corti Email: [email or web address] Website: LinkWedding.[email or web address] Service(s) Offered: Helps individuals with developmental disabilities move from housing that is more restrictive to homes where they  can achieve greater independence and have more  opportunities.  Agency Name: [facility] Address: [address], [place], [place] [address] Phone: [phone] Email: [email or web address] Website: www.[email or web address].org Service(s) Offered: Provides affordable housing for low-income families, elderly, and disabled individuals. Offer a wide range of  programs and services, from financial planning to afterschool and summer programs.  Agency Name: [facility] [employer] Address: [address] [place], [place] [address] Phone: [phone] Service(s) Offered: Child support services; child welfare services; food stamps; Medicaid; work first family assistance; and aid with fuel,  rent, food and medicine.  Agency Name: [facility] [place] [employer] Address: [facility] [address], [place], [place]  [address] Phone: [phone] Website: www.familyabuseservices.org Service(s) Offered: 24 hour Crisis Line: [phone]; 24 hour Emergency  Shelter; Transitional Housing; Support Groups; Scientist, physiological; [employer]; Hispanic Outreach: [phone];  Visitation Center: [phone].  Agency Name: [facility] [place]. Address: [address] [place], [place] [address] Phone: [phone] Service(s) Offered: CAP Services; Home and [employer]; Individual or Group Supports; Respite Care Non-Institutional Nursing;  Residential Supports; Respite Care and Personal Care Services; Transportation; Family and Friends Night; Recreational Activities; Three Nutritious Meals/Snacks; Consultation with Registered Dietician; Twenty-four  hour Registered Nurse Access; Daily and [employer]; Camp Green Leaves; [place] for the [employer] (During Summer Months) Bingo Night (Every  Wednesday Night); Special Populations Dance Night  (Every Tuesday Night); Professional Hair Care Services.  Agency Name: [facility] Address: [address], [place], [place] [address] Phone: [phone] Contact Person: Jabier Mutton Website: [email or web address].Physicist, medical) Offered: Residential treatment facility for women; food and  clothing, educational & employment development and  transportation to work; Counsellor of financial skills;  parenting and family reunification; emotional and spiritual  support; transitional housing for program graduates.  Agency Name: [facility] Address: [address], [place], [place] [address] Phone: [phone] Email: [email or web address] Website: TaskTown.es Service(s) Offered: Public housing units for elderly, disabled, and low income people; housing choice vouchers for income eligible  applicants; shelter plus care vouchers; and Psychologist, clinical.  Agency Name: [facility] [facility] [facility] [employer] Address: [address] [place], [place] [address] Phone: [phone] Email: habitat1@netzero .net Website:  www.habitatalamance.org Service(s) Offered: Build houses for families in need of decent housing. Each adult in the family must invest 200 hours of labor on  someone else's house, work with volunteers to build their own house, attend classes on budgeting, home maintenance, yard care, and attend homeowner association meetings.  Agency Name: [facility] Address: [address], [place], [place] [address] Phone: [phone] Website: www.rsli.org Service(s) Offered: Intermediate care facilities for intellectually delayed, Supervised Living in group homes for adults with developmental disabilities, Supervised Living for people who have dual diagnoses (MRMI), Independent Living, Supported Living, respite and a variety of CAP services, pre-vocational services, day supports, and [employer].  Agency Name: [place] [employer] Phone: [phone] Website: www.NCForeclosurePrevention.gov Service(s) Offered: Zero-interest, deferred loans to homeowners struggling to pay their mortgage. Call for more information.

## 2024-01-20 ENCOUNTER — Telehealth: Payer: Self-pay | Admitting: Surgery

## 2024-01-20 NOTE — Telephone Encounter (Signed)
 Called and spoke with the patient. Per Dr Dana Duncan she may restart her blood pressure and cholesterol medications. She also may be experiencing more pain as the long acting pain medication injected at the time of surgery has warn off. Patient instructed to use ice to the surgical area several times a day and rotate Tylenol with her prescription pain medication.

## 2024-01-20 NOTE — Telephone Encounter (Signed)
 Patient had robotic paraesophageal hernia repair done on 01/16/24 with Dr. Dana Duncan.  Patient states that she is having a lot of pain, especially right side of her stomach.  Also on her discharge papers in stated for her to stop her blood pressure and cholesterol meds.  She is asking when she can restart with her meds.  Please call patient. Thank you.

## 2024-01-22 ENCOUNTER — Ambulatory Visit (INDEPENDENT_AMBULATORY_CARE_PROVIDER_SITE_OTHER): Admitting: Surgery

## 2024-01-22 ENCOUNTER — Encounter: Payer: Self-pay | Admitting: Surgery

## 2024-01-22 VITALS — BP 125/70 | HR 86 | Temp 98.6°F | Ht 66.0 in | Wt 128.8 lb

## 2024-01-22 DIAGNOSIS — Z09 Encounter for follow-up examination after completed treatment for conditions other than malignant neoplasm: Secondary | ICD-10-CM

## 2024-01-22 DIAGNOSIS — K449 Diaphragmatic hernia without obstruction or gangrene: Secondary | ICD-10-CM

## 2024-01-22 DIAGNOSIS — G8918 Other acute postprocedural pain: Secondary | ICD-10-CM

## 2024-01-22 NOTE — Progress Notes (Signed)
 Outpatient Surgical Follow Up  01/22/2024  Joy Carpenter is an 73 y.o. female.   Chief Complaint  Patient presents with   Routine Post Op    HPI: s/p HH repair 6 days ago, had significant lower abd pain over the weekend. I think it was because exparel wore off, taking PO, no emesis. No fevers, ambulating.  Past Medical History:  Diagnosis Date   Acquired hypothyroidism    Actinic keratosis 03/28/2021   right upper arm, bx proven   Anxiety    a.) on BZO (alproazolam) PRN   Carpal tunnel syndrome 2019   Coronary artery disease involving native coronary artery    of the LAD and RCA noted on chest CT   Depression    Diastolic dysfunction    Emphysema of lung (HCC)    GERD (gastroesophageal reflux disease)    Herpes    History of hiatal hernia    Hypertensive chronic kidney disease    Insomnia    a.) on hypnotic (zolpidem) PRN   Long-term use of aspirin therapy    Mixed hyperlipidemia    Osteoporosis    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   Primary hypertension    Pulmonary nodules    SCC (squamous cell carcinoma) 03/28/2021   left dorsal hand, EDC   SCC (squamous cell carcinoma) 08/01/2023   SCC IS, L ant shoulder, EDC 08/27/2023   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   left anterior shoulder, EDC 08/27/23   Stress headaches (almost daily)    Thoracoabdominal aortic aneurysm (HCC)    a.) s/p 4v FEVAR (CA, SMA, RRA, LRA) and proximal TEVAR extention with BILATERAL iliac limb extensions on 11/16/2020   Vertigo 2010   Vitamin D deficiency    Weakness of both legs (intermittent)     Past Surgical History:  Procedure Laterality Date   COLONOSCOPY     COLONOSCOPY WITH PROPOFOL N/A 06/14/2016   Procedure: COLONOSCOPY WITH PROPOFOL;  Surgeon: Marnee Sink, MD;  Location: [facility];  Service: Endoscopy;  Laterality: N/A;   ESOPHAGOGASTRODUODENOSCOPY (EGD) WITH PROPOFOL N/A 06/14/2016   Procedure: ESOPHAGOGASTRODUODENOSCOPY (EGD) WITH PROPOFOL;   Surgeon: Marnee Sink, MD;  Location: [facility];  Service: Endoscopy;  Laterality: N/A;   ESOPHAGOGASTRODUODENOSCOPY (EGD) WITH PROPOFOL N/A 11/27/2021   Procedure: ESOPHAGOGASTRODUODENOSCOPY (EGD) WITH BIOPSY;  Surgeon: Marnee Sink, MD;  Location: [facility];  Service: Endoscopy;  Laterality: N/A;   HYSTEROSCOPY WITH D & C N/A 07/18/2021   Procedure: DILATATION AND CURETTAGE /HYSTEROSCOPY;  Surgeon: Darl Edu, MD;  Location: [facility];  Service: Gynecology;  Laterality: N/A;   INSERTION OF MESH  01/16/2024   Procedure: INSERTION OF MESH;  Surgeon: Alben Alma, MD;  Location: [facility];  Service: General;;   THORACIC AORTIC ANEURYSM REPAIR N/A 11/16/2020   Procedure: 4V FEVAR (CA, SMA, RRA, LRA) WITH PROXIMAL TEVAR EXTENSION WITH BILATERAL ILIAC LIMB EXTENSIONS; Location: [facility]; Surgeon; Melton Squires, MD   TUBAL LIGATION     XI ROBOTIC ASSISTED PARAESOPHAGEAL HERNIA REPAIR N/A 01/16/2024   Procedure: REPAIR, HERNIA, PARAESOPHAGEAL, ROBOT-ASSISTED;  Surgeon: Alben Alma, MD;  Location: [facility];  Service: General;  Laterality: N/A;    Family History  Problem Relation Age of Onset   Leukemia Mother    Aneurysm Other     Social History:  reports that she has been smoking cigarettes. She has a 30 pack-year smoking history. She has been exposed to tobacco smoke. She has never used smokeless tobacco. She reports that she does not  currently use alcohol. She reports that she does not currently use drugs.  Allergies: No Known Allergies  Medications reviewed.    ROS Full ROS performed and is otherwise negative other than what is stated in HPI   BP 125/70   Pulse 86   Temp 98.6 F (37 C)   Ht 5\' 6"  (1.676 m)   Wt 128 lb 12.8 oz (58.4 kg)   SpO2 97%   BMI 20.79 kg/m   Physical Exam NAD alert Walks normally Abd: soft, incision healing well, no infection mild appropriate incisional tenderness w/o peritontiis   Assessment/Plan: Lower abdominal wall pain  likely incisional after difficult paraesophageal H repair, on exam no infection or expanding hematomas, as a  precaution I will obtain CT A/P to make sure we are not missing anything. We will keep her next f/u appt, she is not toxic nor peritonitic and does not need immediate interventions.   Evelia Hipp, MD [facility] General Surgeon

## 2024-01-22 NOTE — Patient Instructions (Addendum)
 We will get you scheduled for a CT scan to better look at this area. We will have you follow up here as scheduled.   You are scheduled for a CT scan at [facility] on April 21st. You will need to arrive there by 2:45 pm and check in at the [facility], Radiology desk.   We will call you with your results.

## 2024-01-27 ENCOUNTER — Ambulatory Visit
Admission: RE | Admit: 2024-01-27 | Discharge: 2024-01-27 | Disposition: A | Discharge status: Home / Self Care | Source: Ambulatory Visit | Attending: Surgery | Admitting: Surgery

## 2024-01-27 DIAGNOSIS — I714 Abdominal aortic aneurysm, without rupture, unspecified: Secondary | ICD-10-CM | POA: Diagnosis not present

## 2024-01-27 DIAGNOSIS — K5730 Diverticulosis of large intestine without perforation or abscess without bleeding: Secondary | ICD-10-CM | POA: Diagnosis not present

## 2024-01-27 DIAGNOSIS — G8918 Other acute postprocedural pain: Secondary | ICD-10-CM | POA: Diagnosis not present

## 2024-01-27 DIAGNOSIS — K449 Diaphragmatic hernia without obstruction or gangrene: Secondary | ICD-10-CM | POA: Diagnosis not present

## 2024-01-27 DIAGNOSIS — K828 Other specified diseases of gallbladder: Secondary | ICD-10-CM | POA: Diagnosis not present

## 2024-01-27 MED ORDER — IOHEXOL 300 MG/ML  SOLN
80.0000 mL | Freq: Once | INTRAMUSCULAR | Status: AC | PRN
  Administered 2024-01-27: 80 mL via INTRAVENOUS

## 2024-01-30 ENCOUNTER — Ambulatory Visit (INDEPENDENT_AMBULATORY_CARE_PROVIDER_SITE_OTHER): Payer: Medicare HMO | Admitting: Physician Assistant

## 2024-01-30 ENCOUNTER — Encounter: Payer: Self-pay | Admitting: Physician Assistant

## 2024-01-30 VITALS — BP 120/70 | HR 66 | Temp 98.1°F | Resp 16 | Ht 66.0 in | Wt 129.0 lb

## 2024-01-30 DIAGNOSIS — F5101 Primary insomnia: Secondary | ICD-10-CM | POA: Diagnosis not present

## 2024-01-30 DIAGNOSIS — G8929 Other chronic pain: Secondary | ICD-10-CM

## 2024-01-30 DIAGNOSIS — Z8719 Personal history of other diseases of the digestive system: Secondary | ICD-10-CM | POA: Diagnosis not present

## 2024-01-30 DIAGNOSIS — R1032 Left lower quadrant pain: Secondary | ICD-10-CM

## 2024-01-30 DIAGNOSIS — Z09 Encounter for follow-up examination after completed treatment for conditions other than malignant neoplasm: Secondary | ICD-10-CM

## 2024-01-30 MED ORDER — ZOLPIDEM TARTRATE 10 MG PO TABS: ORAL_TABLET | ORAL | 2 refills | Status: DC

## 2024-01-30 MED ORDER — TRAMADOL HCL 50 MG PO TABS: 50.0000 mg | ORAL_TABLET | Freq: Every day | ORAL | 0 refills | Status: DC | PRN

## 2024-01-30 NOTE — Progress Notes (Signed)
 [facility] [address] [place], [place] [address]  Internal MEDICINE  Office Visit Note  Patient Name: Joy Carpenter  [id number]  [id number]  Date of Service: 01/30/2024     Chief Complaint  Patient presents with   Hospitalization Follow-up     HPI Pt is here for recent hospital follow up. -here for hospital follow up from robotic assisted laparoscopic paraesophageal hernia repair performed on 4/10, d/c 01/17/24 -States surgery took longer than expected (about twice as long), hernia larger and some scar tissue seen, but otherwise went well. -Sunday after procure had worsening pain so called GS office on Monday and was told likely due to med wearing off. Went in to see GS on Wednesday due to pain though and then he ordered a CT. She states she received a call with results stating CT looked ok, but will be having follow up with them on 28th as well to discuss further and follow up. Pain is in lower right side. It is getting better now. -Liquid/soft food diet currently and is on restriction with lifting.  -Has not had her left side abdominal pain anymore other than an occasional twinge at incisions. Hoping to not need tramadol  and that pain will resolve with hernia now repaired. Does need tramadol  refill at this time, but hopefully no further after this. Will not combine with any remaining oxycodone --only 1 or 2 pills left -incisions doing well  Current Medication: Outpatient Encounter Medications as of 01/30/2024  Medication Sig   ALPRAZolam  (XANAX ) 0.25 MG tablet TAKE 1 TABLET BY MOUTH AS NEEDED FOR ANXIETY   amLODipine  (NORVASC ) 2.5 MG tablet TAKE ONE TABLET BY MOUTH ONCE DAILY hypertension   aspirin  81 MG chewable tablet Chew 81 mg by mouth daily.   atorvastatin  (LIPITOR) 40 MG tablet Take 1 tablet (40 mg total) by mouth daily.   levothyroxine  (SYNTHROID ) 50 MCG tablet Take 1 tablet (50 mcg total) by mouth every morning.   losartan  (COZAAR ) 25 MG tablet Take  0.5 tablets (12.5 mg total) by mouth daily.   [Paused] pantoprazole  (PROTONIX ) 40 MG tablet TAKE 1 TABLET BY MOUTH EVERY DAY   valACYclovir  (VALTREX ) 500 MG tablet TAKE 1 TABLET (500 MG TOTAL) BY MOUTH DAILY.   [DISCONTINUED] oxyCODONE  (OXY IR/ROXICODONE ) 5 MG immediate release tablet Take 1 tablet (5 mg total) by mouth every 6 (six) hours as needed for severe pain (pain score 7-10) or breakthrough pain.   [DISCONTINUED] traMADol  (ULTRAM ) 50 MG tablet TAKE 1 TABLET BY MOUTH EVERY DAY AS NEEDED   [DISCONTINUED] zolpidem  (AMBIEN ) 10 MG tablet TAKE 1 TABLET BY MOUTH EVERYDAY AT BEDTIME (Patient taking differently: Take 10 mg by mouth at bedtime as needed for sleep.)   traMADol  (ULTRAM ) 50 MG tablet Take 1 tablet (50 mg total) by mouth daily as needed.   zolpidem  (AMBIEN ) 10 MG tablet TAKE 1 TABLET BY MOUTH EVERYDAY AT BEDTIME   No facility-administered encounter medications on file as of 01/30/2024.    Surgical History: Past Surgical History:  Procedure Laterality Date   COLONOSCOPY     COLONOSCOPY WITH PROPOFOL  N/A 06/14/2016   Procedure: COLONOSCOPY WITH PROPOFOL ;  Surgeon: Marnee Sink, MD;  Location: [facility];  Service: Endoscopy;  Laterality: N/A;   ESOPHAGOGASTRODUODENOSCOPY (EGD) WITH PROPOFOL  N/A 06/14/2016   Procedure: ESOPHAGOGASTRODUODENOSCOPY (EGD) WITH PROPOFOL ;  Surgeon: Marnee Sink, MD;  Location: [facility];  Service: Endoscopy;  Laterality: N/A;   ESOPHAGOGASTRODUODENOSCOPY (EGD) WITH PROPOFOL  N/A 11/27/2021   Procedure: ESOPHAGOGASTRODUODENOSCOPY (EGD) WITH BIOPSY;  Surgeon: Marnee Sink, MD;  Location: [facility];  Service: Endoscopy;  Laterality: N/A;   HYSTEROSCOPY WITH D & C N/A 07/18/2021   Procedure: DILATATION AND CURETTAGE /HYSTEROSCOPY;  Surgeon: Darl Edu, MD;  Location: [facility];  Service: Gynecology;  Laterality: N/A;   INSERTION OF MESH  01/16/2024   Procedure: INSERTION OF MESH;  Surgeon: Alben Alma, MD;  Location: [facility];   Service: General;;   THORACIC AORTIC ANEURYSM REPAIR N/A 11/16/2020   Procedure: 4V FEVAR (CA, SMA, RRA, LRA) WITH PROXIMAL TEVAR EXTENSION WITH BILATERAL ILIAC LIMB EXTENSIONS; Location: [facility]; Surgeon; Melton Squires, MD   TUBAL LIGATION     XI ROBOTIC ASSISTED PARAESOPHAGEAL HERNIA REPAIR N/A 01/16/2024   Procedure: REPAIR, HERNIA, PARAESOPHAGEAL, ROBOT-ASSISTED;  Surgeon: Alben Alma, MD;  Location: [facility];  Service: General;  Laterality: N/A;    Medical History: Past Medical History:  Diagnosis Date   Acquired hypothyroidism    Actinic keratosis 03/28/2021   right upper arm, bx proven   Anxiety    a.) on BZO (alproazolam) PRN   Carpal tunnel syndrome 2019   Coronary artery disease involving native coronary artery    of the LAD and RCA noted on chest CT   Depression    Diastolic dysfunction    Emphysema of lung (HCC)    GERD (gastroesophageal reflux disease)    Herpes    History of hiatal hernia    Hypertensive chronic kidney disease    Insomnia    a.) on hypnotic (zolpidem ) PRN   Long-term use of aspirin  therapy    Mixed hyperlipidemia    Osteoporosis    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   Primary hypertension    Pulmonary nodules    SCC (squamous cell carcinoma) 03/28/2021   left dorsal hand, EDC   SCC (squamous cell carcinoma) 08/01/2023   SCC IS, L ant shoulder, EDC 08/27/2023   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   left anterior shoulder, EDC 08/27/23   Stress headaches (almost daily)    Thoracoabdominal aortic aneurysm (HCC)    a.) s/p 4v FEVAR (CA, SMA, RRA, LRA) and proximal TEVAR extention with BILATERAL iliac limb extensions on 11/16/2020   Vertigo 2010   Vitamin D  deficiency    Weakness of both legs (intermittent)     Family History: Family History  Problem Relation Age of Onset   Leukemia Mother    Aneurysm Other     Social History   Socioeconomic History   Marital status: Divorced    Spouse name: Not on file   Number of children:  2   Years of education: Not on file   Highest education level: Not on file  Occupational History   Not on file  Tobacco Use   Smoking status: Every Day    Current packs/day: 1.00    Average packs/day: 1 pack/day for 30.0 years (30.0 ttl pk-yrs)    Types: Cigarettes    Passive exposure: Past   Smokeless tobacco: Never   Tobacco comments:    1 pack daily  Vaping Use   Vaping status: Never Used  Substance and Sexual Activity   Alcohol use: Not Currently   Drug use: Not Currently   Sexual activity: Not Currently    Birth control/protection: Post-menopausal  Other Topics Concern   Not on file  Social History Narrative   Lives in [place] with grand-daughter, currently staying with boyfriend   Social Drivers of Health   Financial Resource Strain: Low Risk  (11/29/2023)  Received from [facility]   Overall Financial Resource Strain (CARDIA)    Difficulty of Paying Living Expenses: Not very hard  Food Insecurity: No Food Insecurity (01/16/2024)   Hunger Vital Sign    Worried About Running Out of Food in the Last Year: Never true    Ran Out of Food in the Last Year: Never true  Transportation Needs: Unknown (01/16/2024)   PRAPARE - Administrator, Civil Service (Medical): No    Lack of Transportation (Non-Medical): Not on file  Physical Activity: Not on file  Stress: Not on file  Social Connections: Not on file  Intimate Partner Violence: Not At Risk (01/16/2024)   Humiliation, Afraid, Rape, and Kick questionnaire    Fear of Current or Ex-Partner: No    Emotionally Abused: No    Physically Abused: No    Sexually Abused: No      Review of Systems  Constitutional:  Negative for chills, diaphoresis and fatigue.  HENT:  Negative for ear pain, postnasal drip and sinus pressure.   Eyes:  Negative for photophobia, discharge, redness, itching and visual disturbance.  Respiratory:  Negative for cough, shortness of breath and wheezing.    Cardiovascular:  Negative for palpitations and leg swelling.  Gastrointestinal:  Positive for abdominal pain. Negative for constipation, diarrhea, nausea and vomiting.  Genitourinary:  Negative for dysuria and flank pain.  Musculoskeletal:  Negative for arthralgias, back pain, gait problem and neck pain.  Skin:  Negative for color change.  Allergic/Immunologic: Negative for environmental allergies and food allergies.  Neurological:  Negative for dizziness and headaches.  Hematological:  Does not bruise/bleed easily.  Psychiatric/Behavioral:  Negative for agitation, behavioral problems (depression) and hallucinations. The patient is nervous/anxious.     Vital Signs: BP 120/70   Pulse 66   Temp 98.1 F (36.7 C)   Resp 16   Ht 5\' 6"  (1.676 m)   Wt 129 lb (58.5 kg)   SpO2 100%   BMI 20.82 kg/m    Physical Exam Vitals and nursing note reviewed.  Constitutional:      General: She is not in acute distress.    Appearance: She is well-developed. She is not diaphoretic.  HENT:     Head: Normocephalic and atraumatic.  Eyes:     Pupils: Pupils are equal, round, and reactive to light.  Neck:     Thyroid : No thyromegaly.     Vascular: No JVD.     Trachea: No tracheal deviation.  Cardiovascular:     Rate and Rhythm: Normal rate and regular rhythm.     Heart sounds: Normal heart sounds. No murmur heard.    No friction rub. No gallop.  Pulmonary:     Effort: Pulmonary effort is normal. No respiratory distress.     Breath sounds: No wheezing or rales.  Chest:     Chest wall: No tenderness.  Musculoskeletal:        General: Normal range of motion.     Cervical back: Normal range of motion and neck supple.  Lymphadenopathy:     Cervical: No cervical adenopathy.  Skin:    General: Skin is warm and dry.     Comments: Abdominal incisions healing well without any erythema or drainage  Neurological:     Mental Status: She is alert.  Psychiatric:        Behavior: Behavior normal.         Thought Content: Thought content normal.  Judgment: Judgment normal.       Assessment/Plan: 1. Hospital discharge follow-up (Primary) Improving since surgery, has additional follow up with GS on Monday  2. S/P repair of paraesophageal hernia Improving, follow up with GS on Monday as planned. Advised to call or go to ED if any new or worsening symptoms arise.  3. Chronic LLQ pain Mostly resolved since surgery, though RLQ painful since surgery. May use tramadol  as needed while continuing to heal from surgery. Do not combine with oxycodone  - traMADol  (ULTRAM ) 50 MG tablet; Take 1 tablet (50 mg total) by mouth daily as needed.  Dispense: 30 tablet; Refill: 0 [place] Controlled Substance Database was reviewed by me for overdose risk score (ORS) Reviewed risks and possible side effects associated with taking opiates, benzodiazepines and other CNS depressants. Combination of these could cause dizziness and drowsiness. Advised patient not to drive or operate machinery when taking these medications, as patient's and other's life can be at risk and will have consequences. Patient verbalized understanding in this matter. Dependence and abuse for these drugs will be monitored closely. A Controlled substance policy and procedure is on file which allows [facility] to order a urine drug screen test at any visit. Patient understands and agrees with the plan  4. Primary insomnia - zolpidem  (AMBIEN ) 10 MG tablet; TAKE 1 TABLET BY MOUTH EVERYDAY AT BEDTIME  Dispense: 30 tablet; Refill: 2   General Counseling: renaye janicki understanding of the findings of todays visit and agrees with plan of treatment. I have discussed any further diagnostic evaluation that may be needed or ordered today. We also reviewed her medications today. she has been encouraged to call the office with any questions or concerns that should arise related to todays visit.    Counseling:    No orders of the  defined types were placed in this encounter.   This patient was seen by Taylor Favia, PA-C in collaboration with Dr. Verneta Gone as a part of collaborative care agreement.   I have reviewed all medical records from hospital follow up including radiology reports and consults from other physicians. Appropriate follow up diagnostics will be scheduled as needed. Patient/ Family understands the plan of treatment. Time spent 35 minutes.   Dr Lawton Price, MD Internal Medicine

## 2024-02-03 ENCOUNTER — Encounter: Payer: Self-pay | Admitting: Surgery

## 2024-02-03 ENCOUNTER — Ambulatory Visit (INDEPENDENT_AMBULATORY_CARE_PROVIDER_SITE_OTHER): Admitting: Surgery

## 2024-02-03 VITALS — BP 120/82 | HR 77 | Temp 98.5°F | Ht 66.0 in | Wt 124.6 lb

## 2024-02-03 DIAGNOSIS — Z09 Encounter for follow-up examination after completed treatment for conditions other than malignant neoplasm: Secondary | ICD-10-CM

## 2024-02-03 DIAGNOSIS — K449 Diaphragmatic hernia without obstruction or gangrene: Secondary | ICD-10-CM

## 2024-02-03 NOTE — Patient Instructions (Signed)
 We placed you in our recall system. We will send you out a letter with a date and time to come back to see Dr Dana Duncan in 3 months

## 2024-02-04 NOTE — Progress Notes (Signed)
 Relates 2 and half weeks out from robotic paraesophageal hernia repair.  She is doing better.  No fevers no chills no dysphagia no reflux. Tolerating soft diet.  She did have some pain that prompted a CT scan that have personally reviewed showing appropriate postoperative changes without evidence of complications, chronic pancreatitis and small bilateral effusion c/w post op changes  PE NAD ABd: soft, nt incisions c/d/I no peritonitis   A/P Doing well w/o complications Continue diet RTC 3 months

## 2024-02-27 ENCOUNTER — Other Ambulatory Visit: Payer: Self-pay | Admitting: Physician Assistant

## 2024-02-27 DIAGNOSIS — F411 Generalized anxiety disorder: Secondary | ICD-10-CM

## 2024-02-27 DIAGNOSIS — G8929 Other chronic pain: Secondary | ICD-10-CM

## 2024-03-08 ENCOUNTER — Other Ambulatory Visit: Payer: Self-pay | Admitting: Physician Assistant

## 2024-03-08 DIAGNOSIS — G8929 Other chronic pain: Secondary | ICD-10-CM

## 2024-03-08 DIAGNOSIS — E039 Hypothyroidism, unspecified: Secondary | ICD-10-CM

## 2024-03-08 DIAGNOSIS — I10 Essential (primary) hypertension: Secondary | ICD-10-CM

## 2024-03-09 NOTE — Telephone Encounter (Signed)
 Please review and send due to tramadol  attach

## 2024-03-16 ENCOUNTER — Encounter: Payer: Self-pay | Admitting: Surgery

## 2024-03-16 ENCOUNTER — Ambulatory Visit (INDEPENDENT_AMBULATORY_CARE_PROVIDER_SITE_OTHER): Admitting: Surgery

## 2024-03-16 VITALS — BP 115/75 | HR 76 | Temp 98.7°F | Ht 66.0 in | Wt 124.0 lb

## 2024-03-16 DIAGNOSIS — R1013 Epigastric pain: Secondary | ICD-10-CM

## 2024-03-16 DIAGNOSIS — K449 Diaphragmatic hernia without obstruction or gangrene: Secondary | ICD-10-CM

## 2024-03-16 DIAGNOSIS — Z09 Encounter for follow-up examination after completed treatment for conditions other than malignant neoplasm: Secondary | ICD-10-CM

## 2024-03-16 MED ORDER — GABAPENTIN 300 MG PO CAPS: 300.0000 mg | ORAL_CAPSULE | Freq: Three times a day (TID) | ORAL | 0 refills | Status: DC

## 2024-03-16 NOTE — Progress Notes (Signed)
 Outpatient Surgical Follow Up  03/16/2024  Joy Carpenter is an 73 y.o. female.   Chief Complaint  Patient presents with   Routine Post Op    Paraesophageal hernia repair 01/16/24    [id number] is 2 months out from robotic paraesophageal hernia repair.   She does have intermittent abd pain, this is the same as prior to the repair. She does have hx of chronic pancreatitis.   No fevers no chills no dysphagia no reflux. Tolerating soft diet.   She did have s CT scan that have personally reviewed showing appropriate postoperative changes without evidence of complications, chronic pancreatitis and small bilateral effusion c/w post op changes  Past Medical History:  Diagnosis Date   Acquired hypothyroidism    Actinic keratosis 03/28/2021   right upper arm, bx proven   Anxiety    a.) on BZO (alproazolam) PRN   Carpal tunnel syndrome 2019   Coronary artery disease involving native coronary artery    of the LAD and RCA noted on chest CT   Depression    Diastolic dysfunction    Emphysema of lung (HCC)    GERD (gastroesophageal reflux disease)    Herpes    History of hiatal hernia    Hypertensive chronic kidney disease    Insomnia    a.) on hypnotic (zolpidem ) PRN   Long-term use of aspirin  therapy    Mixed hyperlipidemia    Osteoporosis    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   Primary hypertension    Pulmonary nodules    SCC (squamous cell carcinoma) 03/28/2021   left dorsal hand, EDC   SCC (squamous cell carcinoma) 08/01/2023   SCC IS, L ant shoulder, EDC 08/27/2023   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   left anterior shoulder, EDC 08/27/23   Stress headaches (almost daily)    Thoracoabdominal aortic aneurysm (HCC)    a.) s/p 4v FEVAR (CA, SMA, RRA, LRA) and proximal TEVAR extention with BILATERAL iliac limb extensions on 11/16/2020   Vertigo 2010   Vitamin D  deficiency    Weakness of both legs (intermittent)     Past Surgical History:  Procedure  Laterality Date   COLONOSCOPY     COLONOSCOPY WITH PROPOFOL  N/A 06/14/2016   Procedure: COLONOSCOPY WITH PROPOFOL ;  Surgeon: Marnee Sink, MD;  Location: [facility];  Service: Endoscopy;  Laterality: N/A;   ESOPHAGOGASTRODUODENOSCOPY (EGD) WITH PROPOFOL  N/A 06/14/2016   Procedure: ESOPHAGOGASTRODUODENOSCOPY (EGD) WITH PROPOFOL ;  Surgeon: Marnee Sink, MD;  Location: [facility];  Service: Endoscopy;  Laterality: N/A;   ESOPHAGOGASTRODUODENOSCOPY (EGD) WITH PROPOFOL  N/A 11/27/2021   Procedure: ESOPHAGOGASTRODUODENOSCOPY (EGD) WITH BIOPSY;  Surgeon: Marnee Sink, MD;  Location: [facility];  Service: Endoscopy;  Laterality: N/A;   HYSTEROSCOPY WITH D & C N/A 07/18/2021   Procedure: DILATATION AND CURETTAGE /HYSTEROSCOPY;  Surgeon: Darl Edu, MD;  Location: [facility];  Service: Gynecology;  Laterality: N/A;   INSERTION OF MESH  01/16/2024   Procedure: INSERTION OF MESH;  Surgeon: Alben Alma, MD;  Location: [facility];  Service: General;;   THORACIC AORTIC ANEURYSM REPAIR N/A 11/16/2020   Procedure: 4V FEVAR (CA, SMA, RRA, LRA) WITH PROXIMAL TEVAR EXTENSION WITH BILATERAL ILIAC LIMB EXTENSIONS; Location: [facility]; Surgeon; Melton Squires, MD   TUBAL LIGATION     XI ROBOTIC ASSISTED PARAESOPHAGEAL HERNIA REPAIR N/A 01/16/2024   Procedure: REPAIR, HERNIA, PARAESOPHAGEAL, ROBOT-ASSISTED;  Surgeon: Alben Alma, MD;  Location: [facility];  Service: General;  Laterality: N/A;    Family History  Problem Relation Age of Onset   Leukemia Mother    Aneurysm Other     Social History:  reports that she has been smoking cigarettes. She has a 30 pack-year smoking history. She has been exposed to tobacco smoke. She has never used smokeless tobacco. She reports that she does not currently use alcohol. She reports that she does not currently use drugs.  Allergies: No Known Allergies  Medications reviewed.    ROS Full ROS performed and is otherwise negative other than what is stated  in HPI   BP 115/75   Pulse 76   Temp 98.7 F (37.1 C) (Oral)   Ht 5\' 6"  (1.676 m)   Wt 124 lb (56.2 kg)   SpO2 98%   BMI 20.01 kg/m   Physical Exam NAD alert Abd: soft, nt, incisions healed, no rebound   Assessment/Plan: Chronic pancreatitis w chronic pain CT a/p w cmp and lipase No evidence of surgical complications May f/u w pcp She was asking me about a refill on tramadol  but I responded that I do not do chronic narcotics, I can do neurontin  Encourage her to see PCP Daune Eric p f/u appt w me in a few weeks     Evelia Hipp, MD [facility] General Surgeon

## 2024-03-16 NOTE — Patient Instructions (Signed)
 We have scheduled you for a CT Scan of your Abdomen and Pelvis with contrast. This has been scheduled at [facility] on 03/19/2024 . Please arrive there by 3:15pm. If you need to reschedule your Scan, you may do so by calling [phone]. Please let us  know if you reschedule your scan as we have to get authorization from your insurance for this.         Dysphagia Eating Plan, Bite Size Food This diet is recommended for people who are not able to bite pieces of food but are able to chew. You may need this diet if you have weakness of the muscles that control swallowing, you have an increased risk of choking, or you suffer from fatigue when chewing. Foods in the diet are soft, tender, and moist. Work with your health care provider, your diet and nutrition specialist (dietitian), or speech-language pathologist to make sure you are following the eating plan safely and getting all the nutrients you need. What are tips for following this plan? Cooking To moisten foods, add liquids while you are blending, mashing, or grinding your foods to the right consistency. These liquids include gravies, sauces, vegetable or fruit juice, milk, half and half, or water . Strain extra liquid from foods before eating. Reheat foods slowly to prevent a tough crust from forming. Prepare foods in advance. Meal planning Eat a variety of foods to get all the nutrients you need. Some foods may be tolerated better than others. Work with your Public relations account executive to identify which foods are safest for you to eat. Follow your meal plan as told by your dietitian. General information You may eat foods that are tender, soft, and moist. Always test food texture before taking a bite. Poke food with a fork or spoon to make sure it is tender. The test sample should squash, break apart, or change shape, and it should not return to its original shape when the fork or spoon is removed. Food should be easy to cut and  chew. Avoid large pieces of food that require a lot of chewing. Take small bites. Each bite should be smaller than your thumbnail (about 15 mm by 15 mm for adults and 8mm by 8 mm for children). If you were on a pureed or minced food eating plan, you may eat any of the foods included in those diets. Avoid foods that are very dry, hard, sticky, chewy, coarse, or crunchy. If instructed by your health care provider, thicken liquids. Follow your health care provider's instructions for what products to use, how to do this, and to what thickness. What foods should I eat?        Fruits Canned or cooked fruits that are soft or moist and do not have skin or seeds. Fresh, soft bananas. Vegetables Soft, well-cooked vegetables in small pieces. Soft-cooked, mashed potatoes. Grains Moist breads without nuts or seeds. Biscuits, muffins, pancakes, and waffles that are well-moistened with syrup, jelly, margarine, or butter. Cooked cereals. Moist bread stuffing. Moist rice. Well-moistened cold cereal with small chunks. Well-cooked pasta, noodles, and rice in small pieces and thick sauce. Soft dumplings or spaetzle in small pieces and butter or gravy. Meats and other proteins Tender, moist meats or poultry in small pieces. Moist meatballs or meatloaf. Fish without bones. Eggs or egg substitutes in small pieces. Tofu. Tempeh and meat alternatives in small pieces. Well-cooked, tender beans, peas, baked beans, and other legumes. Dairy Milk. Cream cheese. Yogurt. Cottage cheese. Sour cream. Small pieces of soft cheese.  Fats and oils Butter. Oils. Margarine. Mayonnaise. Gravy. Spreads. Sweets and desserts Soft, smooth, moist desserts. Pudding. Custard. Moist cakes. Jam. Jelly. Honey. Preserves. Ask your health care provider whether you can have frozen desserts. Seasonings and other foods All seasonings and sweeteners. All sauces with small chunks. Prepared tuna, egg, or chicken salad without raw fruits or  vegetables. Moist casseroles with small, tender pieces of meat. Soups with tender meat. The items listed above may not be a complete list of foods and beverages you can eat. Contact a dietitian for more information. What foods should I avoid? Fruits Hard, crunchy, stringy, high-pulp, and juicy raw fruits such as apples, pineapple, papaya, and watermelon. Small, round fruits, such as grapes. Dried fruit and fruit leather. Vegetables All raw vegetables. Cooked corn. Rubbery or stiff cooked vegetables. Stringy vegetables, such as celery. Tough, crisp fried potatoes. Potato skins. Grains Coarse or dry cereals. Dry breads. Toast. Crackers. Tough, crusty breads, such as [place] bread and baguettes. Dry pancakes, waffles, and muffins. Sticky rice. Dry bread stuffing. Granola. Popcorn. Chips. Meats and other proteins Large pieces of meat. Dry, tough meats, such as bacon, sausage, and hot dogs. Chicken, [place], or fish with skin and bones. Crunchy peanut butter. Nuts. Seeds. Nut and seed butters. Dairy Yogurt with nuts, seeds, or large chunks. Large chunks of cheese. Sweets and desserts Dry cakes. Chewy or dry cookies. Any desserts with nuts, seeds, dry fruits, coconut, pineapple, or anything dry, sticky, or hard. Chewy caramel. Licorice. Taffy-type candies. Ask your health care provider whether you can have frozen desserts. Seasonings and other foods Soups with tough or large chunks of meats, poultry, or vegetables. Corn or clam chowder. Smoothies with large chunks of fruit. The items listed above may not be a complete list of foods and beverages you should avoid. Contact a dietitian for more information. Summary Bite-size foods can be helpful for people with swallowing problems. On this dysphagia eating plan, you may eat foods that are soft, moist, and cut into pieces smaller than your thumbnail (about 15 mm by 15 mm for adults and 8mm by 8 mm for children). You may be instructed to thicken liquids.  Follow your health care provider's instructions about how to do this and to what consistency. This information is not intended to replace advice given to you by your health care provider. Make sure you discuss any questions you have with your health care provider. Document Revised: 11/16/2021 Document Reviewed: 11/16/2021 Elsevier Patient Education  2024 [employer].

## 2024-03-18 ENCOUNTER — Encounter: Admitting: Surgery

## 2024-03-19 ENCOUNTER — Other Ambulatory Visit
Admission: RE | Admit: 2024-03-19 | Discharge: 2024-03-19 | Disposition: A | Discharge status: Home / Self Care | Source: Ambulatory Visit | Attending: Surgery | Admitting: Surgery

## 2024-03-19 ENCOUNTER — Ambulatory Visit
Admission: RE | Admit: 2024-03-19 | Discharge: 2024-03-19 | Disposition: A | Discharge status: Home / Self Care | Source: Ambulatory Visit | Attending: Surgery | Admitting: Surgery

## 2024-03-19 DIAGNOSIS — I716 Thoracoabdominal aortic aneurysm, without rupture, unspecified: Secondary | ICD-10-CM | POA: Diagnosis not present

## 2024-03-19 DIAGNOSIS — K8689 Other specified diseases of pancreas: Secondary | ICD-10-CM | POA: Diagnosis not present

## 2024-03-19 DIAGNOSIS — K861 Other chronic pancreatitis: Secondary | ICD-10-CM | POA: Diagnosis not present

## 2024-03-19 DIAGNOSIS — R1013 Epigastric pain: Secondary | ICD-10-CM

## 2024-03-19 DIAGNOSIS — K5730 Diverticulosis of large intestine without perforation or abscess without bleeding: Secondary | ICD-10-CM | POA: Diagnosis not present

## 2024-03-19 LAB — COMPREHENSIVE METABOLIC PANEL WITH GFR
ALT: 15 U/L (ref 0–44)
AST: 21 U/L (ref 15–41)
Albumin: 3.7 g/dL (ref 3.5–5.0)
Alkaline Phosphatase: 135 U/L — ABNORMAL HIGH (ref 38–126)
Anion gap: 8 (ref 5–15)
BUN: 26 mg/dL — ABNORMAL HIGH (ref 8–23)
CO2: 21 mmol/L — ABNORMAL LOW (ref 22–32)
Calcium: 9.5 mg/dL (ref 8.9–10.3)
Chloride: 108 mmol/L (ref 98–111)
Creatinine, Ser: 0.98 mg/dL (ref 0.44–1.00)
GFR, Estimated: >60 mL/min (ref >60)
Glucose, Bld: 157 mg/dL — ABNORMAL HIGH (ref 70–99)
Potassium: 4.3 mmol/L (ref 3.5–5.1)
Sodium: 137 mmol/L (ref 135–145)
Total Bilirubin: 0.6 mg/dL (ref 0.0–1.2)
Total Protein: 7 g/dL (ref 6.5–8.1)

## 2024-03-19 LAB — LIPASE, BLOOD: Lipase: 24 U/L (ref 11–51)

## 2024-03-19 MED ORDER — IOHEXOL 300 MG/ML  SOLN
100.0000 mL | Freq: Once | INTRAMUSCULAR | Status: AC | PRN
  Administered 2024-03-19: 100 mL via INTRAVENOUS

## 2024-03-23 ENCOUNTER — Ambulatory Visit: Payer: Self-pay

## 2024-03-23 NOTE — Telephone Encounter (Signed)
-----   Message from [place] sent at 03/22/2024 10:08 AM EDT ----- Please let her know CT did not show anything acute, she does have chronic pancreatitis and needs to make sure she f/u w GI ----- Message ----- From: Interface, Rad Results In Sent: 03/21/2024   5:49 PM EDT To: Alben Alma, MD

## 2024-03-23 NOTE — Telephone Encounter (Signed)
 Notified patient as instructed, patient pleased. Discussed follow-up appointments, patient agrees Will send a referral to Gastroenterology at [facility] for chronic pancreatitis.

## 2024-03-31 ENCOUNTER — Other Ambulatory Visit: Payer: Self-pay | Admitting: Physician Assistant

## 2024-03-31 MED ORDER — TRAMADOL HCL 50 MG PO TABS: ORAL_TABLET | ORAL | 0 refills | Status: DC

## 2024-03-31 NOTE — Progress Notes (Signed)
 Urgent referral faxed to [facility] for chronic pancreatitis. All images have been power shared.

## 2024-04-02 ENCOUNTER — Telehealth: Payer: Self-pay | Admitting: Physician Assistant

## 2024-04-02 ENCOUNTER — Telehealth: Payer: Self-pay | Admitting: Surgery

## 2024-04-02 NOTE — Telephone Encounter (Signed)
 GI referral faxed to [facility]; [phone] per patient request-Toni

## 2024-04-02 NOTE — Telephone Encounter (Signed)
 Pt is needing a referral along with the last ct scans about 2 weeks ago that were done to [facility] in [place] at fax [phone]. I asked what was the name of the place and she said that all we needed was their fax# and send all the above to them. Pt # is [phone].

## 2024-04-02 NOTE — Telephone Encounter (Signed)
 Spoke with the patient and her Primary care provider has referred her to [facility]. I will fax over her imaging studies to them and also have them PowerShared to the [facility] system.

## 2024-04-14 ENCOUNTER — Encounter: Payer: Self-pay | Admitting: Cardiology

## 2024-04-14 ENCOUNTER — Ambulatory Visit: Attending: Cardiology | Admitting: Cardiology

## 2024-04-14 VITALS — BP 101/67 | HR 77 | Ht 66.0 in | Wt 126.8 lb

## 2024-04-14 DIAGNOSIS — F17200 Nicotine dependence, unspecified, uncomplicated: Secondary | ICD-10-CM

## 2024-04-14 DIAGNOSIS — I10 Essential (primary) hypertension: Secondary | ICD-10-CM | POA: Diagnosis not present

## 2024-04-14 DIAGNOSIS — K861 Other chronic pancreatitis: Secondary | ICD-10-CM

## 2024-04-14 DIAGNOSIS — I739 Peripheral vascular disease, unspecified: Secondary | ICD-10-CM | POA: Diagnosis not present

## 2024-04-14 DIAGNOSIS — E785 Hyperlipidemia, unspecified: Secondary | ICD-10-CM

## 2024-04-14 DIAGNOSIS — I2510 Atherosclerotic heart disease of native coronary artery without angina pectoris: Secondary | ICD-10-CM

## 2024-04-14 NOTE — Progress Notes (Signed)
 Cardiology Office Note   Date:  04/14/2024  ID:  Joy Carpenter, Joy Carpenter 19-Aug-1951, MRN [id number] PCP: Kristina Tinnie POUR, PA-C  [facility] Providers Cardiologist:  Redell Cave, MD     History of Present Illness Joy Carpenter is a 74 y.o. female with a past medical history of coronary artery disease with coronary calcifications noted on chest CT, hypertension, hyperlipidemia, thoracic abdominal aortic aneurysm status post four-vessel for EVAR (CA, SMA, RA, LRA) proximal T EVAR extension and bilateral iliac limb extension status post endograft (11/2020), current smoker x 20+ years, who presents today for follow-up of coronary artery disease.   She had complaints of chest pain that been ongoing for approximately a year that seem to worsen in February 2024. Symptoms were not associated with exertion or rest. Her symptoms were pain radiating down her left arm. She had an aneurysm status post endograft repair at [facility] in 2022 and follows with [facility] yearly for monitoring. She feels stressed due to family situations and she was scheduled for an echocardiogram and a Lexiscan  Myoview  at her last clinic appointment 11/28/2022. She return to clinic in 01/08/2023 overall doing fairly well. She did follow-up with nephrology. Recently started on losartan  25 mg daily. She occasionally was experiencing muscle cramps in the lower extremities and continued atypical chest and abdominal pressure with unrevealing studies.   She was seen in clinic 11/14/2023 overall doing well from a cardiac perspective.  She continued to have some abdominal pain that radiated up into her chest that was unchanged in frequency and intensity.  She stated that discomfort and been ongoing for approximately 2 years.  She was evaluated in [facility] emergency department 11/29/2023 for cough and rib cage pain.  Overall workup was unrevealing.  She was treated with Lidoderm  patch and Tylenol  was discharged home.  She was seen in  clinic 01/08/2024 states that she been doing fairly well but she had an upcoming procedure that required surgical evaluation today.  She denies any chest pain or shortness of breath.  Her losartan  was reduced to 12.5 mg daily due to soft blood pressures.  No further testing was needed.   She was hospitalized at [facility] 4/10 - 01/17/2024 for repair of a paraesophageal hernia.  She open/10/20 function with robotic assisted laparoscopic paraesophageal hernia repair with partial fundoplication.  Postoperatively she did well with no trouble swallowing.  Diet was advanced and she ambulated without difficulty.  She was considered stable and was discharged on 01/17/24.  She returns to clinic today after recent surgery with chest discomfort, abdominal pain, sweating, and difficulty swallowing solid foods, and overall concerned as she stated she has just been told she has chronic pancreatitis.  States that she has been compliant with her current medication regimen without any undue side effects.  She has worked with getting an appointment with GI in the [place] at the end of July.  Chest discomfort in his left lower side under her ribs that has been ongoing for approximately 2 years and is unchanged.    ROS: 10 point review of system has been reviewed and considered negative except ones been listed in the HPI  Studies Reviewed      TTE 12/25/22  1. Left ventricular ejection fraction, by estimation, is 60 to 65%. The  left ventricle has normal function. The left ventricle has no regional  wall motion abnormalities. Left ventricular diastolic parameters are  consistent with Grade I diastolic  dysfunction (impaired relaxation). The average left ventricular global  longitudinal strain is -18.7 %.   2. Right ventricular systolic function is normal. The right ventricular  size is normal. Tricuspid regurgitation signal is inadequate for assessing  PA pressure.   3. The mitral valve is normal in structure. Mild  mitral valve  regurgitation. No evidence of mitral stenosis.   4. The aortic valve is tricuspid. Aortic valve regurgitation is not  visualized. Aortic valve sclerosis/calcification is present, without any  evidence of aortic stenosis.   5. The inferior vena cava is normal in size with greater than 50%  respiratory variability, suggesting right atrial pressure of 3 mmHg.    Lexiscan  Myoview  12/05/22 LV perfusion was normal Left ventricular function was normal.  Nuclear stress EF 76%.  End-diastolic cavity size is normal. Findings are consistent with no ischemia.  The study is low risk Risk Assessment/Calculations           Physical Exam VS:  BP 101/67   Pulse 77   Ht 5' 6 (1.676 m)   Wt 126 lb 12.8 oz (57.5 kg)   SpO2 95%   BMI 20.47 kg/m        Wt Readings from Last 3 Encounters:  04/14/24 126 lb 12.8 oz (57.5 kg)  03/16/24 124 lb (56.2 kg)  02/03/24 124 lb 9.6 oz (56.5 kg)    GEN: Well nourished, well developed in no acute distress NECK: No JVD; No carotid bruits CARDIAC: RRR, no murmurs, rubs, gallops RESPIRATORY:  Clear to auscultation without rales, wheezing or rhonchi  ABDOMEN: Soft, non-tender, non-distended EXTREMITIES:  No edema; No deformity   ASSESSMENT AND PLAN Coronary artery disease native coronary calcification noted to the LAD and RCA on chest CT. ischemic workup has been negative.  Symptoms have been unchanged for approximately 2 years.  Recently told she has chronic pancreatitis.  She is continued on aspirin  81 mg daily and atorvastatin  40 mg daily for secondary prevention of progression.  No further ischemic testing needed at this time.  Primary hypertension with blood pressure today 101/67.  Blood pressure has been well-controlled.  She is continued on amlodipine  2.5 mg daily and losartan  12.5 mg daily.  She has been encouraged to continue to monitor pressure 1 to 2 hours postmedication administration as well.  Mixed hyperlipidemia with most recent LDL  73.  She is continued on atorvastatin  40 mg daily.  Ongoing management per his PCP with upcoming labs.  Peripheral arterial disease with previous surgery by vascular for AAA and endograft.  She continues to be followed by vascular surgery at [facility].  Tobacco abuse with total cessation is recommended.  Chronic pancreatitis noted on recent CT scan in April.  She has upcoming appointment with GI he at [facility].       Dispo: Patient to return to clinic to see MD/APP in 6 months or sooner if needed for further evaluation.  Signed, Delanee Xin, NP

## 2024-04-14 NOTE — Patient Instructions (Signed)
 Medication Instructions:  Your physician recommends that you continue on your current medications as directed. Please refer to the Current Medication list given to you today.   *If you need a refill on your cardiac medications before your next appointment, please call your pharmacy*  Lab Work: No labs ordered today  If you have labs (blood work) drawn today and your tests are completely normal, you will receive your results only by: MyChart Message (if you have MyChart) OR A paper copy in the mail If you have any lab test that is abnormal or we need to change your treatment, we will call you to review the results.  Testing/Procedures: No test ordered today   Follow-Up: At [facility], you and your health needs are our priority.  As part of our continuing mission to provide you with exceptional heart care, our providers are all part of one team.  This team includes your primary Cardiologist (physician) and Advanced Practice Providers or APPs (Physician Assistants and Nurse Practitioners) who all work together to provide you with the care you need, when you need it.  Your next appointment:   6 month(s)  Provider:   Redell Cave, MD or Tylene Lunch, NP

## 2024-04-20 ENCOUNTER — Telehealth: Payer: Self-pay | Admitting: Physician Assistant

## 2024-04-20 NOTE — Telephone Encounter (Signed)
 Received fax from [facility] stating they are unable to reach patient. I s/w patient, she stated she called [facility] for appointment. They have her scheduled in [place], 05/07/24-Toni

## 2024-04-30 ENCOUNTER — Ambulatory Visit (INDEPENDENT_AMBULATORY_CARE_PROVIDER_SITE_OTHER): Admitting: Physician Assistant

## 2024-04-30 ENCOUNTER — Encounter: Payer: Self-pay | Admitting: Physician Assistant

## 2024-04-30 VITALS — BP 106/70 | HR 96 | Temp 98.6°F | Resp 16 | Ht 66.0 in | Wt 125.0 lb

## 2024-04-30 DIAGNOSIS — G8929 Other chronic pain: Secondary | ICD-10-CM

## 2024-04-30 DIAGNOSIS — K861 Other chronic pancreatitis: Secondary | ICD-10-CM | POA: Diagnosis not present

## 2024-04-30 DIAGNOSIS — I10 Essential (primary) hypertension: Secondary | ICD-10-CM | POA: Diagnosis not present

## 2024-04-30 DIAGNOSIS — F411 Generalized anxiety disorder: Secondary | ICD-10-CM | POA: Diagnosis not present

## 2024-04-30 DIAGNOSIS — R1310 Dysphagia, unspecified: Secondary | ICD-10-CM

## 2024-04-30 DIAGNOSIS — R1032 Left lower quadrant pain: Secondary | ICD-10-CM | POA: Diagnosis not present

## 2024-04-30 DIAGNOSIS — F5101 Primary insomnia: Secondary | ICD-10-CM | POA: Diagnosis not present

## 2024-04-30 MED ORDER — TRAMADOL HCL 50 MG PO TABS
ORAL_TABLET | ORAL | 0 refills | Status: DC
Start: 2024-04-30 — End: 2024-06-03

## 2024-04-30 MED ORDER — ALPRAZOLAM 0.25 MG PO TABS: ORAL_TABLET | ORAL | 2 refills | Status: DC

## 2024-04-30 NOTE — Progress Notes (Signed)
 [facility] [address] [place], [place] [address]  Internal MEDICINE  Office Visit Note  Patient Name: Joy Carpenter  [id number]  [id number]  Date of Service: 04/30/2024  Chief Complaint  Patient presents with   Follow-up   Depression   Gastroesophageal Reflux   Hypertension   Hyperlipidemia   Medication Refill    HPI Pt is here for routine follow up -having pain on both sides of abdomen now -post hernia surgery was hurting and GS did CT and said it looked good, but she went back in May and was still having more pain so he looked back at CT and was told she has chronic pancreatitis -Has appt next Friday with [facility] to address this finding. Also has feeling of food getting stuck in esophagus and will discuss this as well. Has had to cough food back up recently. She is eating small bites and chewing thoroughly.  -Pain all throughout abdomen, worst on the LLQ still which has been present for years but seems worse again after initially improving with surgery -taking tramadol  and tylenol , will give refill while she awaits further workup with GI  -goes back to Dr. Jordis on Tuesday -BM never quite normal, but since yesterday some diarrhea. Also eating more salads more since they dont seem to get stuck so some change to diet recently. No blood in stools  Current Medication: Outpatient Encounter Medications as of 04/30/2024  Medication Sig   amLODipine  (NORVASC ) 2.5 MG tablet TAKE ONE TABLET BY MOUTH ONCE DAILY HYPERTENSION   aspirin  81 MG chewable tablet Chew 81 mg by mouth daily.   atorvastatin  (LIPITOR) 40 MG tablet Take 1 tablet (40 mg total) by mouth daily.   gabapentin  (NEURONTIN ) 300 MG capsule Take 1 capsule (300 mg total) by mouth 3 (three) times daily.   levothyroxine  (SYNTHROID ) 50 MCG tablet TAKE 1 TABLET BY MOUTH EVERY DAY IN THE MORNING   losartan  (COZAAR ) 25 MG tablet Take 0.5 tablets (12.5 mg total) by mouth daily.   valACYclovir  (VALTREX ) 500 MG  tablet TAKE 1 TABLET (500 MG TOTAL) BY MOUTH DAILY.   zolpidem  (AMBIEN ) 10 MG tablet TAKE 1 TABLET BY MOUTH EVERYDAY AT BEDTIME   [DISCONTINUED] ALPRAZolam  (XANAX ) 0.25 MG tablet TAKE 1 TABLET BY MOUTH AS NEEDED FOR ANXIETY   [DISCONTINUED] traMADol  (ULTRAM ) 50 MG tablet Take 1/2 to 1 tablet by mouth daily as needed for pain   ALPRAZolam  (XANAX ) 0.25 MG tablet Take 1 tablet by mouth as needed for anxiety   traMADol  (ULTRAM ) 50 MG tablet Take 1/2 to 1 tablet by mouth daily as needed for pain   No facility-administered encounter medications on file as of 04/30/2024.    Surgical History: Past Surgical History:  Procedure Laterality Date   COLONOSCOPY     COLONOSCOPY WITH PROPOFOL  N/A 06/14/2016   Procedure: COLONOSCOPY WITH PROPOFOL ;  Surgeon: Rogelia Copping, MD;  Location: [facility];  Service: Endoscopy;  Laterality: N/A;   ESOPHAGOGASTRODUODENOSCOPY (EGD) WITH PROPOFOL  N/A 06/14/2016   Procedure: ESOPHAGOGASTRODUODENOSCOPY (EGD) WITH PROPOFOL ;  Surgeon: Rogelia Copping, MD;  Location: [facility];  Service: Endoscopy;  Laterality: N/A;   ESOPHAGOGASTRODUODENOSCOPY (EGD) WITH PROPOFOL  N/A 11/27/2021   Procedure: ESOPHAGOGASTRODUODENOSCOPY (EGD) WITH BIOPSY;  Surgeon: Copping Rogelia, MD;  Location: [facility];  Service: Endoscopy;  Laterality: N/A;   HYSTEROSCOPY WITH D & C N/A 07/18/2021   Procedure: DILATATION AND CURETTAGE /HYSTEROSCOPY;  Surgeon: Lake Read, MD;  Location: [facility];  Service: Gynecology;  Laterality: N/A;  INSERTION OF MESH  01/16/2024   Procedure: INSERTION OF MESH;  Surgeon: Jordis Laneta FALCON, MD;  Location: [facility];  Service: General;;   THORACIC AORTIC ANEURYSM REPAIR N/A 11/16/2020   Procedure: 4V FEVAR (CA, SMA, RRA, LRA) WITH PROXIMAL TEVAR EXTENSION WITH BILATERAL ILIAC LIMB EXTENSIONS; Location: [facility]; Surgeon; Oneil Phlegm, MD   TUBAL LIGATION     XI ROBOTIC ASSISTED PARAESOPHAGEAL HERNIA REPAIR N/A 01/16/2024   Procedure: REPAIR, HERNIA,  PARAESOPHAGEAL, ROBOT-ASSISTED;  Surgeon: Jordis Laneta FALCON, MD;  Location: [facility];  Service: General;  Laterality: N/A;    Medical History: Past Medical History:  Diagnosis Date   Acquired hypothyroidism    Actinic keratosis 03/28/2021   right upper arm, bx proven   Anxiety    a.) on BZO (alproazolam) PRN   Carpal tunnel syndrome 2019   Coronary artery disease involving native coronary artery    of the LAD and RCA noted on chest CT   Depression    Diastolic dysfunction    Emphysema of lung (HCC)    GERD (gastroesophageal reflux disease)    Herpes    History of hiatal hernia    Hypertensive chronic kidney disease    Insomnia    a.) on hypnotic (zolpidem ) PRN   Long-term use of aspirin  therapy    Mixed hyperlipidemia    Osteoporosis    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   Primary hypertension    Pulmonary nodules    SCC (squamous cell carcinoma) 03/28/2021   left dorsal hand, EDC   SCC (squamous cell carcinoma) 08/01/2023   SCC IS, L ant shoulder, ED[facility] 08/27/2023   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   left anterior shoulder, EDC 08/27/23   Stress headaches (almost daily)    Thoracoabdominal aortic aneurysm (HCC)    a.) s/p 4v FEVAR (CA, SMA, RRA, LRA) and proximal TEVAR extention with BILATERAL iliac limb extensions on 11/16/2020   Vertigo 2010   Vitamin D  deficiency    Weakness of both legs (intermittent)     Family History: Family History  Problem Relation Age of Onset   Leukemia Mother    Aneurysm Other     Social History   Socioeconomic History   Marital status: Divorced    Spouse name: Not on file   Number of children: 2   Years of education: Not on file   Highest education level: Not on file  Occupational History   Not on file  Tobacco Use   Smoking status: Every Day    Current packs/day: 1.00    Average packs/day: 1 pack/day for 30.0 years (30.0 ttl pk-yrs)    Types: Cigarettes    Passive exposure: Past   Smokeless tobacco: Never    Tobacco comments:    1 pack daily  Vaping Use   Vaping status: Never Used  Substance and Sexual Activity   Alcohol use: Not Currently   Drug use: Not Currently   Sexual activity: Not Currently    Birth control/protection: Post-menopausal  Other Topics Concern   Not on file  Social History Narrative   Lives in [place] with grand-daughter, currently staying with boyfriend   Social Drivers of Health   Financial Resource Strain: Low Risk  (11/29/2023)   Received from [facility]   Overall Financial Resource Strain (CARDIA)    Difficulty of Paying Living Expenses: Not very hard  Food Insecurity: No Food Insecurity (01/16/2024)   Hunger Vital Sign    Worried About Running Out of Food in the  Last Year: Never true    Ran Out of Food in the Last Year: Never true  Transportation Needs: Unknown (01/16/2024)   PRAPARE - Administrator, Civil Service (Medical): No    Lack of Transportation (Non-Medical): Not on file  Physical Activity: Not on file  Stress: Not on file  Social Connections: Not on file  Intimate Partner Violence: Not At Risk (01/16/2024)   Humiliation, Afraid, Rape, and Kick questionnaire    Fear of Current or Ex-Partner: No    Emotionally Abused: No    Physically Abused: No    Sexually Abused: No      Review of Systems  Constitutional:  Negative for chills, diaphoresis and fatigue.  HENT:  Positive for trouble swallowing. Negative for ear pain, postnasal drip and sinus pressure.   Eyes:  Negative for photophobia, discharge, redness, itching and visual disturbance.  Respiratory:  Negative for cough, shortness of breath and wheezing.   Cardiovascular:  Negative for palpitations and leg swelling.  Gastrointestinal:  Positive for abdominal pain and diarrhea. Negative for constipation and vomiting.  Genitourinary:  Negative for dysuria and flank pain.  Musculoskeletal:  Negative for arthralgias, back pain, gait problem and neck pain.  Skin:   Negative for color change.  Allergic/Immunologic: Negative for environmental allergies and food allergies.  Neurological:  Negative for dizziness and headaches.  Hematological:  Does not bruise/bleed easily.  Psychiatric/Behavioral:  Negative for agitation, behavioral problems (depression) and hallucinations. The patient is nervous/anxious.     Vital Signs: BP 106/70   Pulse 96   Temp 98.6 F (37 C)   Resp 16   Ht 5' 6 (1.676 m)   Wt 125 lb (56.7 kg)   SpO2 96%   BMI 20.18 kg/m    Physical Exam Vitals and nursing note reviewed.  Constitutional:      General: She is not in acute distress.    Appearance: She is well-developed. She is not diaphoretic.  HENT:     Head: Normocephalic and atraumatic.  Eyes:     Extraocular Movements: Extraocular movements intact.  Neck:     Thyroid : No thyromegaly.     Vascular: No JVD.     Trachea: No tracheal deviation.  Cardiovascular:     Rate and Rhythm: Normal rate and regular rhythm.     Heart sounds: Normal heart sounds. No murmur heard.    No friction rub. No gallop.  Pulmonary:     Effort: Pulmonary effort is normal. No respiratory distress.     Breath sounds: No wheezing or rales.  Chest:     Chest wall: No tenderness.  Abdominal:     General: Bowel sounds are normal.     Palpations: Abdomen is soft.     Tenderness: There is no abdominal tenderness.  Musculoskeletal:        General: Normal range of motion.  Skin:    General: Skin is warm and dry.  Neurological:     Mental Status: She is alert and oriented to person, place, and time.     Cranial Nerves: No cranial nerve deficit.  Psychiatric:        Behavior: Behavior normal.        Thought Content: Thought content normal.        Judgment: Judgment normal.        Assessment/Plan: 1. Essential hypertension (Primary) BP stable, continue to monitor  2. Generalized anxiety disorder May continue xanax  as needed - ALPRAZolam  (XANAX ) 0.25 MG tablet; Take 1  tablet by  mouth as needed for anxiety  Dispense: 30 tablet; Refill: 2  3. Primary insomnia Takes ambien  to help sleep  4. Chronic LLQ pain May use tramadol /tylenol  prn, will be having GI appt next week to address further  5. Chronic pancreatitis, unspecified pancreatitis type (HCC) Will be seeing GI next week for further evaluation  6. Dysphagia, unspecified type Seeing GI next week, likely will need upper endoscopy/ Encouraged to take small bites and sips and chew food well. Stay upright during and after meals   General Counseling: tosca pletz understanding of the findings of todays visit and agrees with plan of treatment. I have discussed any further diagnostic evaluation that may be needed or ordered today. We also reviewed her medications today. she has been encouraged to call the office with any questions or concerns that should arise related to todays visit.    No orders of the defined types were placed in this encounter.   Meds ordered this encounter  Medications   ALPRAZolam  (XANAX ) 0.25 MG tablet    Sig: Take 1 tablet by mouth as needed for anxiety    Dispense:  30 tablet    Refill:  2    This request is for a new prescription for a controlled substance as required by Federal/State law.   traMADol  (ULTRAM ) 50 MG tablet    Sig: Take 1/2 to 1 tablet by mouth daily as needed for pain    Dispense:  30 tablet    Refill:  0    This patient was seen by Tinnie Pro, PA-C in collaboration with Dr. Sigrid Bathe as a part of collaborative care agreement.   Total time spent:30 Minutes Time spent includes review of chart, medications, test results, and follow up plan with the patient.      Dr Fozia M Khan Internal medicine

## 2024-05-04 ENCOUNTER — Ambulatory Visit: Admitting: Surgery

## 2024-05-04 ENCOUNTER — Encounter: Payer: Self-pay | Admitting: Surgery

## 2024-05-04 VITALS — BP 109/73 | HR 80 | Temp 98.8°F | Ht 66.0 in | Wt 123.6 lb

## 2024-05-04 DIAGNOSIS — R1084 Generalized abdominal pain: Secondary | ICD-10-CM | POA: Diagnosis not present

## 2024-05-04 DIAGNOSIS — R1310 Dysphagia, unspecified: Secondary | ICD-10-CM

## 2024-05-04 NOTE — Patient Instructions (Addendum)
 Your Barium Swallow is scheduled for 05/07/2024 10 am (arrive by 9:45 am) at [facility]. Nothing to eat or drink 4 hours prior.   Chronic Pancreatitis  Chronic pancreatitis is permanent inflammation and scarring of the pancreas that leads to pancreatic dysfunction. The pancreas is a gland that is found behind the stomach. The pancreas makes proteins (enzymes) that help to digest food. It also releases hormones called glucagon and insulin. These help regulate blood sugar (glucose). Damage to the pancreas may affect digestion, may cause pain in the upper abdomen and back, and may cause diabetes. Inflammation can also irritate other organs in the abdomen near the pancreas. At first, pancreatitis may be sudden (acute). When you have repeated or long-lasting episodes of acute pancreatitis, damage to the pancreas can be permanent and lead to chronic pancreatitis. Sometimes, though, there is no history of acute pancreatitis. What are the causes? The most common cause of this condition is heavy alcohol use. Other causes include: Hypertriglyceridemia. This is increased, or elevated, levels of triglycerides in the blood. Gallstones or other conditions that block the tube that drains the pancreas (pancreatic duct). Health conditions such as pancreatic cancer or a problem where the body's defense system (immune system) attacks the pancreas (autoimmune pancreatitis). Hypercalcemia. This is elevated calcium  levels in the blood. This condition may be caused by the parathyroid gland being too active (hyperparathyroidism). Having an injured or infected pancreas. Being exposed to certain medicines or certain chemicals. In children, chronic pancreatitis is most often caused by inherited conditions. These come from genes that are passed from parent to child. The most common of these conditions is cystic fibrosis. In some cases, the cause of chronic pancreatitis may not be known. What increases the risk? This  condition is more likely to develop in people who: Are female. Are 33-17 years old. Have a family history of pancreatitis. Smoke tobacco. Drink a lot of alcohol over a long period of time. What are the signs or symptoms? Symptoms of this condition may include: Pain in the abdomen or upper back. Pain may be severe and often gets worse after you eat. Nausea and vomiting. Fever. Weight loss. A change in the color and firmness (consistency) of stool (feces), such as stools that are oily, fatty, or clay-colored. How is this diagnosed? This condition is diagnosed based on your symptoms, your medical history, and a physical exam. You may have tests, such as: Blood tests. Stool samples. Biopsy of the pancreas. This is the removal of a sample of pancreas tissue to be tested in a lab. Imaging tests, such as CT scans, MRIs, or an ultrasound of the abdomen. How is this treated? Chronic pancreatitis results in permanent damage that leads to pancreatic dysfunction and long-term (chronic) pain. To manage chronic pancreatitis, you will need to: Stop using alcohol or tobacco. Manage pain. Methods of pain control may include: Medicines such as those used for pain or depression (antidepressants). Surgery to remove a blockage or buildup of fluid causing pain. A procedure to block the nerves that sense pain in the pancreas (celiac plexus nerve block). Improve digestion. You may be given: Medicines to replace your pancreatic enzymes. Vitamin supplements. A specific diet to follow. You may work with a dietitian to make an eating plan. Monitor for the development of diabetes. You may need to: Get screened for diabetes regularly. Check your blood glucose levels at home at regular times. Sometimes, acute flares of pain may require hospital treatment. Follow these instructions at home: Eating and  drinking     Do not drink alcohol. If you need help quitting, ask your health care provider. Follow a diet as  told by your health care provider or dietitian, if this applies. This may include: Limiting how much fat you eat. Eating smaller meals more often. Avoiding caffeine. Drink enough fluid to keep your urine pale yellow. General instructions Take over-the-counter and prescription medicines only as told by your health care provider. These include vitamin supplements. Ask your health care provider if the medicine prescribed to you: Requires you to avoid driving or using machinery. Can cause constipation. You may need to take these actions to prevent or treat constipation: Take over-the-counter or prescription medicines. Eat foods that are high in fiber, such as beans, whole grains, and fresh fruits and vegetables. Limit foods that are high in fat and processed sugars, such as fried or sweet foods. Do not use any products that contain nicotine or tobacco. These products include cigarettes, chewing tobacco, and vaping devices, such as e-cigarettes. If you need help quitting, ask your health care provider. If directed, check your blood sugar at home as told. Keep all follow-up visits. This is important. Contact a health care provider if: You have pain that does not get better with medicine. You have a fever. You have sudden weight loss. Get help right away if: Your pain suddenly gets worse. You have sudden swelling in your abdomen. You start to vomit often. You have diarrhea that does not go away. You vomit blood or have blood in your stool. You become confused or you have trouble thinking clearly. These symptoms may be an emergency. Get help right away. Call 911. Do not wait to see if the symptoms will go away. Do not drive yourself to the hospital. Summary Chronic pancreatitis is permanent inflammation and scarring of the pancreas that leads to pancreatic dysfunction. Damage to the pancreas may affect digestion, may cause pain in the upper abdomen and back, and may cause diabetes.  Inflammation can also irritate other organs in the abdomen near the pancreas. Common causes of this condition are heavy alcohol use, gallstones, increased (elevated) levels of triglycerides, and certain medicines. To manage this condition: control pain, replace enzymes, and do not drink alcohol. This information is not intended to replace advice given to you by your health care provider. Make sure you discuss any questions you have with your health care provider. Document Revised: 08/15/2021 Document Reviewed: 08/15/2021 Elsevier Patient Education  2024 [employer].

## 2024-05-05 NOTE — Progress Notes (Signed)
 Outpatient Surgical Follow Up    Joy Carpenter is an 73 y.o. female.   Chief Complaint  Patient presents with   Follow-up    Hiatal hernia repair 01/16/24    HPI: :Joy Carpenter is 3 1/2 months out from robotic paraesophageal hernia repair.   She does have intermittent abd pain, this is the same as prior to the repair. She does have hx of chronic pancreatitis.   No fevers no chills  no reflux.  She reports, some occasional dysphagia. Does have an upcoming appointment with GI Tolerating soft diet.   She did have s CT scan that have personally reviewed showing appropriate postoperative changes without evidence of complications, chronic pancreatitis and small bilateral effusion c/w post op changes  Past Medical History:  Diagnosis Date   Acquired hypothyroidism    Actinic keratosis 03/28/2021   right upper arm, bx proven   Anxiety    a.) on BZO (alproazolam) PRN   Carpal tunnel syndrome 2019   Coronary artery disease involving native coronary artery    of the LAD and RCA noted on chest CT   Depression    Diastolic dysfunction    Emphysema of lung (HCC)    GERD (gastroesophageal reflux disease)    Herpes    History of hiatal hernia    Hypertensive chronic kidney disease    Insomnia    a.) on hypnotic (zolpidem ) PRN   Long-term use of aspirin  therapy    Mixed hyperlipidemia    Osteoporosis    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   Primary hypertension    Pulmonary nodules    SCC (squamous cell carcinoma) 03/28/2021   left dorsal hand, EDC   SCC (squamous cell carcinoma) 08/01/2023   SCC IS, L ant shoulder, EDC 08/27/2023   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   left anterior shoulder, EDC 08/27/23   Stress headaches (almost daily)    Thoracoabdominal aortic aneurysm (HCC)    a.) s/p 4v FEVAR (CA, SMA, RRA, LRA) and proximal TEVAR extention with BILATERAL iliac limb extensions on 11/16/2020   Vertigo 2010   Vitamin D  deficiency    Weakness of both legs  (intermittent)     Past Surgical History:  Procedure Laterality Date   COLONOSCOPY     COLONOSCOPY WITH PROPOFOL  N/A 06/14/2016   Procedure: COLONOSCOPY WITH PROPOFOL ;  Surgeon: Rogelia Copping, MD;  Location: [facility];  Service: Endoscopy;  Laterality: N/A;   ESOPHAGOGASTRODUODENOSCOPY (EGD) WITH PROPOFOL  N/A 06/14/2016   Procedure: ESOPHAGOGASTRODUODENOSCOPY (EGD) WITH PROPOFOL ;  Surgeon: Rogelia Copping, MD;  Location: [facility];  Service: Endoscopy;  Laterality: N/A;   ESOPHAGOGASTRODUODENOSCOPY (EGD) WITH PROPOFOL  N/A 11/27/2021   Procedure: ESOPHAGOGASTRODUODENOSCOPY (EGD) WITH BIOPSY;  Surgeon: Copping Rogelia, MD;  Location: [facility];  Service: Endoscopy;  Laterality: N/A;   HYSTEROSCOPY WITH D & C N/A 07/18/2021   Procedure: DILATATION AND CURETTAGE /HYSTEROSCOPY;  Surgeon: Lake Read, MD;  Location: [facility];  Service: Gynecology;  Laterality: N/A;   INSERTION OF MESH  01/16/2024   Procedure: INSERTION OF MESH;  Surgeon: Jordis Laneta FALCON, MD;  Location: [facility] OR[facility];  Service: General;;   THORACIC AORTIC ANEURYSM REPAIR N/A 11/16/2020   Procedure: 4V FEVAR (CA, SMA, RRA, LRA) WITH PROXIMAL TEVAR EXTENSION WITH BILATERAL ILIAC LIMB EXTENSIONS; Location: [facility]; Surgeon; Oneil Phlegm, MD   TUBAL LIGATION     XI ROBOTIC ASSISTED PARAESOPHAGEAL HERNIA REPAIR N/A 01/16/2024   Procedure: REPAIR, HERNIA, PARAESOPHAGEAL, ROBOT-ASSISTED;  Surgeon: Jordis Laneta FALCON, MD;  Location: [facility];  Service: General;  Laterality: N/A;    Family History  Problem Relation Age of Onset   Leukemia Mother    Aneurysm Other     Social History:  reports that she has been smoking cigarettes. She has a 30 pack-year smoking history. She has been exposed to tobacco smoke. She has never used smokeless tobacco. She reports that she does not currently use alcohol. She reports that she does not currently use drugs.  Allergies: No Known Allergies  Medications reviewed.    ROS Full ROS  performed and is otherwise negative other than what is stated in HPI   BP 109/73   Pulse 80   Temp 98.8 F (37.1 C) (Oral)   Ht 5' 6 (1.676 m)   Wt 123 lb 9.6 oz (56.1 kg)   SpO2 97%   BMI 19.95 kg/m   Physical Exam Vitals and nursing note reviewed. Exam conducted with a chaperone present.  Constitutional:      General: She is not in acute distress.    Appearance: Normal appearance. She is not ill-appearing.  Pulmonary:     Effort: Pulmonary effort is normal.     Breath sounds: No stridor.  Abdominal:     General: Abdomen is flat. There is no distension.     Palpations: Abdomen is soft. There is no mass.     Tenderness: There is no abdominal tenderness. There is no guarding.     Hernia: No hernia is present.     Comments: Scars from robotic procedure healed  Musculoskeletal:     Cervical back: Normal range of motion and neck supple.  Skin:    General: Skin is warm and dry.     Capillary Refill: Capillary refill takes less than 2 seconds.  Neurological:     General: No focal deficit present.     Mental Status: She is alert and oriented to person, place, and time.  Psychiatric:        Mood and Affect: Mood normal.        Behavior: Behavior normal.        Thought Content: Thought content normal.        Judgment: Judgment normal.      Assessment/Plan: Chronic abdominal pain.  This is likely related to chronic pancreatitis.  Prior workup has failed to show any evidence of complications related to her surgery.  Given that she has some occasional dysphagia will obtain barium swallow. Encouraged her to follow-up with GI regarding her chronic pancreatitis I personally spent a total of 30 minutes in the care of the patient today including performing a medically appropriate exam/evaluation, counseling and educating, placing orders, referring and communicating with other health care professionals, documenting clinical information in the EHR, independently interpreting and  reviewing images studies and coordinating care.   Laneta Luna, MD [facility] General Surgeon

## 2024-05-07 ENCOUNTER — Other Ambulatory Visit

## 2024-05-07 DIAGNOSIS — K861 Other chronic pancreatitis: Secondary | ICD-10-CM | POA: Diagnosis not present

## 2024-05-14 ENCOUNTER — Encounter: Payer: Self-pay | Admitting: Dermatology

## 2024-05-14 ENCOUNTER — Ambulatory Visit: Payer: Medicare HMO | Admitting: Dermatology

## 2024-05-14 DIAGNOSIS — Z1283 Encounter for screening for malignant neoplasm of skin: Secondary | ICD-10-CM | POA: Diagnosis not present

## 2024-05-14 DIAGNOSIS — Z86007 Personal history of in-situ neoplasm of skin: Secondary | ICD-10-CM | POA: Diagnosis not present

## 2024-05-14 DIAGNOSIS — D1801 Hemangioma of skin and subcutaneous tissue: Secondary | ICD-10-CM

## 2024-05-14 DIAGNOSIS — L570 Actinic keratosis: Secondary | ICD-10-CM

## 2024-05-14 DIAGNOSIS — W908XXA Exposure to other nonionizing radiation, initial encounter: Secondary | ICD-10-CM | POA: Diagnosis not present

## 2024-05-14 DIAGNOSIS — L814 Other melanin hyperpigmentation: Secondary | ICD-10-CM

## 2024-05-14 DIAGNOSIS — L578 Other skin changes due to chronic exposure to nonionizing radiation: Secondary | ICD-10-CM

## 2024-05-14 DIAGNOSIS — Z85828 Personal history of other malignant neoplasm of skin: Secondary | ICD-10-CM | POA: Diagnosis not present

## 2024-05-14 DIAGNOSIS — L821 Other seborrheic keratosis: Secondary | ICD-10-CM

## 2024-05-14 DIAGNOSIS — D229 Melanocytic nevi, unspecified: Secondary | ICD-10-CM

## 2024-05-14 NOTE — Progress Notes (Signed)
 Follow-Up Visit   Subjective  Joy Carpenter is a 73 y.o. female who presents for the following: Skin Cancer Screening and Full Body Skin Exam  The patient presents for Total-Body Skin Exam (TBSE) for skin cancer screening and mole check. The patient has spots, moles and lesions to be evaluated, some may be new or changing.  Pt has a lesion above the L ear that has been there for many years, and a new lesion on the R upper eyelid lat that she would like checked.  The following portions of the chart were reviewed this encounter and updated as appropriate: medications, allergies, medical history  Review of Systems:  No other skin or systemic complaints except as noted in HPI or Assessment and Plan.  Objective  Well appearing patient in no apparent distress; mood and affect are within normal limits.  A full examination was performed including scalp, head, eyes, ears, nose, lips, neck, chest, axillae, abdomen, back, buttocks, bilateral upper extremities, bilateral lower extremities, hands, feet, fingers, toes, fingernails, and toenails. All findings within normal limits unless otherwise noted below.   Relevant physical exam findings are noted in the Assessment and Plan.  Face Erythematous thin papules/macules with gritty scale.   Assessment & Plan   SKIN CANCER SCREENING PERFORMED TODAY.  ACTINIC DAMAGE - Chronic condition, secondary to cumulative UV/sun exposure - diffuse scaly erythematous macules with underlying dyspigmentation - Recommend daily broad spectrum sunscreen SPF 30+ to sun-exposed areas, reapply every 2 hours as needed.  - Staying in the shade or wearing long sleeves, sun glasses (UVA+UVB protection) and wide brim hats (4-inch brim around the entire circumference of the hat) are also recommended for sun protection.  - Call for new or changing lesions.  LENTIGINES, SEBORRHEIC KERATOSES, HEMANGIOMAS - Benign normal skin lesions - Benign-appearing - Call for  any changes  MELANOCYTIC NEVI - Tan-brown and/or pink-flesh-colored symmetric macules and papules - Benign appearing on exam today - Observation - Call clinic for new or changing moles - Recommend daily use of broad spectrum spf 30+ sunscreen to sun-exposed areas.   HISTORY OF SQUAMOUS CELL CARCINOMA OF THE SKIN - No evidence of recurrence today - No lymphadenopathy - Recommend regular full body skin exams - Recommend daily broad spectrum sunscreen SPF 30+ to sun-exposed areas, reapply every 2 hours as needed.  - Call if any new or changing lesions are noted between office visits  HISTORY OF SQUAMOUS CELL CARCINOMA IN SITU OF THE SKIN - No evidence of recurrence today - Recommend regular full body skin exams - Recommend daily broad spectrum sunscreen SPF 30+ to sun-exposed areas, reapply every 2 hours as needed.  - Call if any new or changing lesions are noted between office visits AK (ACTINIC KERATOSIS) Face Actinic keratoses are precancerous spots that appear secondary to cumulative UV radiation exposure/sun exposure over time. They are chronic with expected duration over 1 year. A portion of actinic keratoses will progress to squamous cell carcinoma of the skin. It is not possible to reliably predict which spots will progress to skin cancer and so treatment is recommended to prevent development of skin cancer.  Recommend daily broad spectrum sunscreen SPF 30+ to sun-exposed areas, reapply every 2 hours as needed.  Recommend staying in the shade or wearing long sleeves, sun glasses (UVA+UVB protection) and wide brim hats (4-inch brim around the entire circumference of the hat). Call for new or changing lesions.  Observe  MULTIPLE BENIGN NEVI   LENTIGINES   ACTINIC ELASTOSIS  SEBORRHEIC KERATOSES   CHERRY ANGIOMA   Return in about 6 months (around 11/14/2024) for TBSE Hx SCC, SCCIS, AK.  I, Rosina Mayans, CMA, am acting as scribe for Boneta Sharps, MD  .   Documentation: I have reviewed the above documentation for accuracy and completeness, and I agree with the above.  Boneta Sharps, MD

## 2024-05-14 NOTE — Patient Instructions (Signed)

## 2024-05-20 DIAGNOSIS — K861 Other chronic pancreatitis: Secondary | ICD-10-CM | POA: Diagnosis not present

## 2024-05-20 DIAGNOSIS — K8689 Other specified diseases of pancreas: Secondary | ICD-10-CM | POA: Diagnosis not present

## 2024-05-30 ENCOUNTER — Other Ambulatory Visit: Payer: Self-pay | Admitting: Physician Assistant

## 2024-06-01 ENCOUNTER — Other Ambulatory Visit: Payer: Self-pay | Admitting: Physician Assistant

## 2024-06-05 ENCOUNTER — Other Ambulatory Visit: Payer: Self-pay | Admitting: Cardiology

## 2024-06-10 ENCOUNTER — Other Ambulatory Visit: Payer: Self-pay | Admitting: Physician Assistant

## 2024-06-10 DIAGNOSIS — F5101 Primary insomnia: Secondary | ICD-10-CM

## 2024-06-22 ENCOUNTER — Emergency Department

## 2024-06-22 ENCOUNTER — Other Ambulatory Visit: Payer: Self-pay

## 2024-06-22 ENCOUNTER — Encounter
Admission: EM | Disposition: A | Discharge status: Home / Self Care | Payer: Self-pay | Source: Home / Self Care | Attending: Internal Medicine

## 2024-06-22 ENCOUNTER — Inpatient Hospital Stay: Admit: 2024-06-22

## 2024-06-22 ENCOUNTER — Inpatient Hospital Stay
Admission: EM | Admit: 2024-06-22 | Discharge: 2024-06-24 | DRG: 322 | Disposition: A | Discharge status: Home / Self Care | Attending: Internal Medicine | Admitting: Internal Medicine

## 2024-06-22 DIAGNOSIS — I2510 Atherosclerotic heart disease of native coronary artery without angina pectoris: Secondary | ICD-10-CM | POA: Diagnosis not present

## 2024-06-22 DIAGNOSIS — I2111 ST elevation (STEMI) myocardial infarction involving right coronary artery: Principal | ICD-10-CM | POA: Diagnosis present

## 2024-06-22 DIAGNOSIS — Z8679 Personal history of other diseases of the circulatory system: Secondary | ICD-10-CM

## 2024-06-22 DIAGNOSIS — L7632 Postprocedural hematoma of skin and subcutaneous tissue following other procedure: Secondary | ICD-10-CM | POA: Diagnosis not present

## 2024-06-22 DIAGNOSIS — F419 Anxiety disorder, unspecified: Secondary | ICD-10-CM | POA: Diagnosis present

## 2024-06-22 DIAGNOSIS — Z86008 Personal history of in-situ neoplasm of other site: Secondary | ICD-10-CM

## 2024-06-22 DIAGNOSIS — F17210 Nicotine dependence, cigarettes, uncomplicated: Secondary | ICD-10-CM | POA: Diagnosis present

## 2024-06-22 DIAGNOSIS — G4700 Insomnia, unspecified: Secondary | ICD-10-CM | POA: Diagnosis present

## 2024-06-22 DIAGNOSIS — Y838 Other surgical procedures as the cause of abnormal reaction of the patient, or of later complication, without mention of misadventure at the time of the procedure: Secondary | ICD-10-CM | POA: Diagnosis not present

## 2024-06-22 DIAGNOSIS — I10 Essential (primary) hypertension: Secondary | ICD-10-CM

## 2024-06-22 DIAGNOSIS — I2119 ST elevation (STEMI) myocardial infarction involving other coronary artery of inferior wall: Secondary | ICD-10-CM | POA: Diagnosis not present

## 2024-06-22 DIAGNOSIS — Z7902 Long term (current) use of antithrombotics/antiplatelets: Secondary | ICD-10-CM

## 2024-06-22 DIAGNOSIS — Z7982 Long term (current) use of aspirin: Secondary | ICD-10-CM

## 2024-06-22 DIAGNOSIS — E782 Mixed hyperlipidemia: Secondary | ICD-10-CM | POA: Diagnosis present

## 2024-06-22 DIAGNOSIS — J439 Emphysema, unspecified: Secondary | ICD-10-CM | POA: Diagnosis present

## 2024-06-22 DIAGNOSIS — Z79899 Other long term (current) drug therapy: Secondary | ICD-10-CM | POA: Diagnosis not present

## 2024-06-22 DIAGNOSIS — Z95828 Presence of other vascular implants and grafts: Secondary | ICD-10-CM | POA: Diagnosis not present

## 2024-06-22 DIAGNOSIS — I129 Hypertensive chronic kidney disease with stage 1 through stage 4 chronic kidney disease, or unspecified chronic kidney disease: Secondary | ICD-10-CM | POA: Diagnosis not present

## 2024-06-22 DIAGNOSIS — I959 Hypotension, unspecified: Secondary | ICD-10-CM | POA: Diagnosis not present

## 2024-06-22 DIAGNOSIS — I739 Peripheral vascular disease, unspecified: Secondary | ICD-10-CM | POA: Diagnosis not present

## 2024-06-22 DIAGNOSIS — I708 Atherosclerosis of other arteries: Secondary | ICD-10-CM | POA: Diagnosis present

## 2024-06-22 DIAGNOSIS — E785 Hyperlipidemia, unspecified: Secondary | ICD-10-CM | POA: Diagnosis not present

## 2024-06-22 DIAGNOSIS — Z79890 Hormone replacement therapy (postmenopausal): Secondary | ICD-10-CM

## 2024-06-22 DIAGNOSIS — Z716 Tobacco abuse counseling: Secondary | ICD-10-CM

## 2024-06-22 DIAGNOSIS — M810 Age-related osteoporosis without current pathological fracture: Secondary | ICD-10-CM | POA: Diagnosis present

## 2024-06-22 DIAGNOSIS — R200 Anesthesia of skin: Secondary | ICD-10-CM | POA: Diagnosis not present

## 2024-06-22 DIAGNOSIS — I255 Ischemic cardiomyopathy: Secondary | ICD-10-CM | POA: Diagnosis not present

## 2024-06-22 DIAGNOSIS — E039 Hypothyroidism, unspecified: Secondary | ICD-10-CM | POA: Diagnosis present

## 2024-06-22 DIAGNOSIS — I213 ST elevation (STEMI) myocardial infarction of unspecified site: Secondary | ICD-10-CM | POA: Diagnosis not present

## 2024-06-22 DIAGNOSIS — D696 Thrombocytopenia, unspecified: Secondary | ICD-10-CM | POA: Diagnosis not present

## 2024-06-22 DIAGNOSIS — I9589 Other hypotension: Secondary | ICD-10-CM | POA: Diagnosis not present

## 2024-06-22 DIAGNOSIS — Z806 Family history of leukemia: Secondary | ICD-10-CM

## 2024-06-22 DIAGNOSIS — F17200 Nicotine dependence, unspecified, uncomplicated: Secondary | ICD-10-CM | POA: Diagnosis not present

## 2024-06-22 DIAGNOSIS — T148XXA Other injury of unspecified body region, initial encounter: Secondary | ICD-10-CM | POA: Insufficient documentation

## 2024-06-22 LAB — LIPID PANEL
Cholesterol: 138 mg/dL (ref 0–200)
HDL: 66 mg/dL (ref >40)
LDL Cholesterol: 58 mg/dL (ref 0–99)
Total CHOL/HDL Ratio: 2.1 ratio
Triglycerides: 70 mg/dL (ref <150)
VLDL: 14 mg/dL (ref 0–40)

## 2024-06-22 LAB — CBC WITH DIFFERENTIAL/PLATELET
Abs Immature Granulocytes: 0.03 K/uL (ref 0.00–0.07)
Basophils Absolute: 0.1 K/uL (ref 0.0–0.1)
Basophils Relative: 1 %
Eosinophils Absolute: 0.3 K/uL (ref 0.0–0.5)
Eosinophils Relative: 4 %
HCT: 36.6 % (ref 36.0–46.0)
Hemoglobin: 11.9 g/dL — ABNORMAL LOW (ref 12.0–15.0)
Immature Granulocytes: 0 %
Lymphocytes Relative: 29 %
Lymphs Abs: 2.3 K/uL (ref 0.7–4.0)
MCH: 33.1 pg (ref 26.0–34.0)
MCHC: 32.5 g/dL (ref 30.0–36.0)
MCV: 101.7 fL — ABNORMAL HIGH (ref 80.0–100.0)
Monocytes Absolute: 0.6 K/uL (ref 0.1–1.0)
Monocytes Relative: 8 %
Neutro Abs: 4.6 K/uL (ref 1.7–7.7)
Neutrophils Relative %: 58 %
Platelets: 161 K/uL (ref 150–400)
RBC: 3.6 MIL/uL — ABNORMAL LOW (ref 3.87–5.11)
RDW: 13.5 % (ref 11.5–15.5)
WBC: 7.9 K/uL (ref 4.0–10.5)
nRBC: 0 % (ref 0.0–0.2)

## 2024-06-22 LAB — CG4 I-STAT (LACTIC ACID): Lactic Acid, Venous: 0.4 mmol/L — ABNORMAL LOW (ref 0.5–1.9)

## 2024-06-22 LAB — POCT ACTIVATED CLOTTING TIME
Activated Clotting Time: 245 s
Activated Clotting Time: 291 s

## 2024-06-22 LAB — COMPREHENSIVE METABOLIC PANEL WITH GFR
ALT: 13 U/L (ref 0–44)
AST: 20 U/L (ref 15–41)
Albumin: 3.6 g/dL (ref 3.5–5.0)
Alkaline Phosphatase: 120 U/L (ref 38–126)
Anion gap: 7 (ref 5–15)
BUN: 19 mg/dL (ref 8–23)
CO2: 25 mmol/L (ref 22–32)
Calcium: 8.6 mg/dL — ABNORMAL LOW (ref 8.9–10.3)
Chloride: 110 mmol/L (ref 98–111)
Creatinine, Ser: 0.98 mg/dL (ref 0.44–1.00)
GFR, Estimated: >60 mL/min (ref >60)
Glucose, Bld: 136 mg/dL — ABNORMAL HIGH (ref 70–99)
Potassium: 4 mmol/L (ref 3.5–5.1)
Sodium: 142 mmol/L (ref 135–145)
Total Bilirubin: 0.6 mg/dL (ref 0.0–1.2)
Total Protein: 6.5 g/dL (ref 6.5–8.1)

## 2024-06-22 LAB — PROTIME-INR
INR: 1 (ref 0.8–1.2)
Prothrombin Time: 13.4 s (ref 11.4–15.2)

## 2024-06-22 LAB — HEMOGLOBIN A1C
Hgb A1c MFr Bld: 5.6 % (ref 4.8–5.6)
Mean Plasma Glucose: 114.02 mg/dL

## 2024-06-22 LAB — LACTIC ACID, PLASMA: Lactic Acid, Venous: 1.6 mmol/L (ref 0.5–1.9)

## 2024-06-22 LAB — TROPONIN I (HIGH SENSITIVITY)
Troponin I (High Sensitivity): 5 ng/L (ref <18)
Troponin I (High Sensitivity): 1427 ng/L (ref <18)
Troponin I (High Sensitivity): 11072 ng/L (ref <18)

## 2024-06-22 LAB — APTT: aPTT: 25 s (ref 24–36)

## 2024-06-22 LAB — GLUCOSE, CAPILLARY: Glucose-Capillary: 101 mg/dL — ABNORMAL HIGH (ref 70–99)

## 2024-06-22 LAB — MRSA NEXT GEN BY PCR, NASAL: MRSA by PCR Next Gen: NOT DETECTED

## 2024-06-22 SURGERY — CORONARY/GRAFT ACUTE MI REVASCULARIZATION
Anesthesia: Moderate Sedation

## 2024-06-22 MED ORDER — SODIUM CHLORIDE 0.9% FLUSH
3.0000 mL | Freq: Two times a day (BID) | INTRAVENOUS | Status: DC
  Administered 2024-06-22 – 2024-06-24 (×5): 3 mL via INTRAVENOUS

## 2024-06-22 MED ORDER — ATORVASTATIN CALCIUM 80 MG PO TABS
80.0000 mg | ORAL_TABLET | Freq: Every day | ORAL | Status: DC
  Administered 2024-06-22 – 2024-06-24 (×3): 80 mg via ORAL
  Filled 2024-06-22 (×4): qty 1

## 2024-06-22 MED ORDER — TRAMADOL HCL 50 MG PO TABS
50.0000 mg | ORAL_TABLET | Freq: Four times a day (QID) | ORAL | Status: DC | PRN
  Administered 2024-06-22 – 2024-06-23 (×3): 50 mg via ORAL
  Filled 2024-06-22 (×3): qty 1

## 2024-06-22 MED ORDER — SODIUM CHLORIDE 0.9 % IV SOLN: 250.0000 mL | INTRAVENOUS | Status: DC | PRN

## 2024-06-22 MED ORDER — LIDOCAINE HCL (PF) 1 % IJ SOLN
INTRAMUSCULAR | Status: DC | PRN
  Administered 2024-06-22: 2 mL

## 2024-06-22 MED ORDER — HEPARIN (PORCINE) IN NACL 1000-0.9 UT/500ML-% IV SOLN
INTRAVENOUS | Status: AC
  Filled 2024-06-22: qty 1000

## 2024-06-22 MED ORDER — HEPARIN (PORCINE) IN NACL 1000-0.9 UT/500ML-% IV SOLN
INTRAVENOUS | Status: DC | PRN
  Administered 2024-06-22: 1000 mL

## 2024-06-22 MED ORDER — HEPARIN SODIUM (PORCINE) 1000 UNIT/ML IJ SOLN
INTRAMUSCULAR | Status: AC
  Filled 2024-06-22: qty 10

## 2024-06-22 MED ORDER — ASPIRIN 81 MG PO CHEW
324.0000 mg | CHEWABLE_TABLET | Freq: Once | ORAL | Status: AC
  Administered 2024-06-22: 324 mg via ORAL
  Filled 2024-06-22: qty 4

## 2024-06-22 MED ORDER — PRASUGREL HCL 10 MG PO TABS
10.0000 mg | ORAL_TABLET | Freq: Every day | ORAL | Status: DC
  Administered 2024-06-23: 10 mg via ORAL
  Filled 2024-06-22: qty 1

## 2024-06-22 MED ORDER — HEPARIN (PORCINE) 25000 UT/250ML-% IV SOLN: 700.0000 [IU]/h | INTRAVENOUS | Status: DC

## 2024-06-22 MED ORDER — HEPARIN SODIUM (PORCINE) 1000 UNIT/ML IJ SOLN
INTRAMUSCULAR | Status: DC | PRN
  Administered 2024-06-22: 1000 [IU] via INTRAVENOUS
  Administered 2024-06-22: 5000 [IU] via INTRAVENOUS
  Administered 2024-06-22: 3000 [IU] via INTRAVENOUS

## 2024-06-22 MED ORDER — FREE WATER: 500.0000 mL | Freq: Once | Status: DC

## 2024-06-22 MED ORDER — VERAPAMIL HCL 2.5 MG/ML IV SOLN
INTRAVENOUS | Status: DC | PRN
  Administered 2024-06-22: 2.5 mg via INTRA_ARTERIAL

## 2024-06-22 MED ORDER — ASPIRIN 81 MG PO CHEW
81.0000 mg | CHEWABLE_TABLET | Freq: Every day | ORAL | Status: DC
  Administered 2024-06-23 – 2024-06-24 (×2): 81 mg via ORAL
  Filled 2024-06-22 (×2): qty 1

## 2024-06-22 MED ORDER — ATROPINE SULFATE 1 MG/10ML IJ SOSY
PREFILLED_SYRINGE | INTRAMUSCULAR | Status: AC
Start: 2024-06-22 — End: 2024-06-22
  Filled 2024-06-22: qty 10

## 2024-06-22 MED ORDER — ONDANSETRON HCL 4 MG/2ML IJ SOLN
4.0000 mg | Freq: Once | INTRAMUSCULAR | Status: AC
  Administered 2024-06-22: 4 mg via INTRAVENOUS
  Filled 2024-06-22: qty 2

## 2024-06-22 MED ORDER — VERAPAMIL HCL 2.5 MG/ML IV SOLN
INTRAVENOUS | Status: AC
Start: 2024-06-22 — End: 2024-06-22
  Filled 2024-06-22: qty 2

## 2024-06-22 MED ORDER — SODIUM CHLORIDE 0.9 % IV BOLUS
1000.0000 mL | Freq: Once | INTRAVENOUS | Status: AC
  Administered 2024-06-22: 1000 mL via INTRAVENOUS

## 2024-06-22 MED ORDER — PRASUGREL HCL 10 MG PO TABS
ORAL_TABLET | ORAL | Status: AC
  Filled 2024-06-22: qty 6

## 2024-06-22 MED ORDER — METOPROLOL SUCCINATE ER 50 MG PO TB24
25.0000 mg | ORAL_TABLET | Freq: Every day | ORAL | Status: DC
  Administered 2024-06-22: 25 mg via ORAL
  Filled 2024-06-22: qty 1

## 2024-06-22 MED ORDER — LEVOTHYROXINE SODIUM 50 MCG PO TABS
50.0000 ug | ORAL_TABLET | Freq: Every day | ORAL | Status: DC
  Administered 2024-06-22 – 2024-06-24 (×3): 50 ug via ORAL
  Filled 2024-06-22 (×3): qty 1

## 2024-06-22 MED ORDER — SODIUM CHLORIDE 0.9% FLUSH: 3.0000 mL | INTRAVENOUS | Status: DC | PRN

## 2024-06-22 MED ORDER — LIDOCAINE HCL (PF) 1 % IJ SOLN
INTRAMUSCULAR | Status: AC
  Filled 2024-06-22: qty 30

## 2024-06-22 MED ORDER — ZOLPIDEM TARTRATE 5 MG PO TABS
5.0000 mg | ORAL_TABLET | Freq: Every evening | ORAL | Status: DC | PRN
  Administered 2024-06-22 – 2024-06-23 (×2): 5 mg via ORAL
  Filled 2024-06-22 (×2): qty 1

## 2024-06-22 MED ORDER — PRASUGREL HCL 10 MG PO TABS
ORAL_TABLET | ORAL | Status: DC | PRN
  Administered 2024-06-22: 60 mg via ORAL

## 2024-06-22 MED ORDER — HEPARIN SODIUM (PORCINE) 1000 UNIT/ML IJ SOLN
60.0000 [IU]/kg | Freq: Once | INTRAMUSCULAR | Status: AC
Start: 2024-06-22 — End: 2024-06-22
  Administered 2024-06-22: 3450 [IU] via INTRAVENOUS
  Filled 2024-06-22: qty 1
  Filled 2024-06-22: qty 0.69

## 2024-06-22 MED ORDER — LOSARTAN POTASSIUM 50 MG PO TABS
25.0000 mg | ORAL_TABLET | Freq: Every day | ORAL | Status: DC
Start: 2024-06-22 — End: 2024-06-23
  Filled 2024-06-22: qty 1

## 2024-06-22 MED ORDER — IOHEXOL 300 MG/ML  SOLN
INTRAMUSCULAR | Status: DC | PRN
  Administered 2024-06-22: 169 mL

## 2024-06-22 MED ORDER — SODIUM CHLORIDE 0.9 % IV SOLN
INTRAVENOUS | Status: DC
  Administered 2024-06-22: 20 mL/h via INTRAVENOUS

## 2024-06-22 SURGICAL SUPPLY — 15 items
BALLOON TREK RX 2.5X15 (BALLOONS) IMPLANT
BALLOON ~~LOC~~ TREK NEO RX 3.5X8 (BALLOONS) IMPLANT
CATH 5FR JL3.5 JR4 ANG PIG MP (CATHETERS) IMPLANT
CATH LAUNCHER 6FR JR4 (CATHETERS) IMPLANT
DEVICE RAD TR BAND REGULAR (VASCULAR PRODUCTS) IMPLANT
DRAPE BRACHIAL (DRAPES) IMPLANT
GLIDESHEATH SLEND SS 6F .021 (SHEATH) IMPLANT
GUIDEWIRE INQWIRE 1.5J.035X260 (WIRE) IMPLANT
KIT ENCORE 26 ADVANTAGE (KITS) IMPLANT
PACK CARDIAC CATH (CUSTOM PROCEDURE TRAY) ×1 IMPLANT
SET ATX-X65L (MISCELLANEOUS) IMPLANT
STATION PROTECTION PRESSURIZED (MISCELLANEOUS) IMPLANT
STENT ONYX FRONTIER 3.0X12 (Permanent Stent) IMPLANT
TUBING CIL FLEX 10 FLL-RA (TUBING) IMPLANT
WIRE G HI TQ BMW 190 (WIRE) IMPLANT

## 2024-06-22 NOTE — ED Notes (Signed)
 This RN called and spoke with pharmacy regarding heparin  bolus.

## 2024-06-22 NOTE — ED Provider Notes (Signed)
 [facility] Provider Note    Event Date/Time   First MD Initiated Contact with Patient 06/22/24 0402     (approximate)   History   Chest Pain  Pt reports chest burning that began earlier tonight, pt also repots tingling in bilateral arms, speech clear, able to move all extremities w/out deficits. Denies cardiac hx   HPI Joy Carpenter is a 73 y.o. female PMH tobacco use, hyperlipidemia, CAD, hypertension, abdominal aortic aneurysm status post four-vessel EVAR presents for evaluation of chest discomfort and bilateral arm numbness - Patient developed this around 2:15 AM.  Chest pressure radiating to bilateral arms with bilateral arm numbness.  No abdominal pain.  No focal weakness. -Has otherwise been in her usual state of health     Physical Exam   Triage Vital Signs: ED Triage Vitals  Encounter Vitals Group     BP 06/22/24 0354 (!) 70/47     Girls Systolic BP Percentile --      Girls Diastolic BP Percentile --      Boys Systolic BP Percentile --      Boys Diastolic BP Percentile --      Pulse Rate 06/22/24 0354 (!) 55     Resp 06/22/24 0354 17     Temp 06/22/24 0354 97.8 F (36.6 C)     Temp src --      SpO2 06/22/24 0354 100 %     Weight --      Height --      Head Circumference --      Peak Flow --      Pain Score 06/22/24 0353 10     Pain Loc --      Pain Education --      Exclude from Growth Chart --     Most recent vital signs: Vitals:   06/22/24 0441 06/22/24 0600  BP: 123/79 95/77  Pulse: 63   Resp: 14 19  Temp:    SpO2: 100%      General: Awake, no distress.  CV:  Good peripheral perfusion. RRR, difficult to palpate radial pulses bilateral upper extremities Resp:  Normal effort. CTAB Abd:  No distention. Nontender to deep palpation throughout.  No pulsatile mass appreciated. Neuro:  Face symmetric, moving all extremities spontaneously, no focal motor deficit appreciated    ED Results / Procedures / Treatments    Labs (all labs ordered are listed, but only abnormal results are displayed) Labs Reviewed  CBC WITH DIFFERENTIAL/PLATELET - Abnormal; Notable for the following components:      Result Value   RBC 3.60 (*)    Hemoglobin 11.9 (*)    MCV 101.7 (*)    All other components within normal limits  COMPREHENSIVE METABOLIC PANEL WITH GFR - Abnormal; Notable for the following components:   Glucose, Bld 136 (*)    Calcium  8.6 (*)    All other components within normal limits  GLUCOSE, CAPILLARY - Abnormal; Notable for the following components:   Glucose-Capillary 101 (*)    All other components within normal limits  CG4 I-STAT (LACTIC ACID) - Abnormal; Notable for the following components:   Lactic Acid, Venous 0.4 (*)    All other components within normal limits  MRSA NEXT GEN BY PCR, NASAL  PROTIME-INR  APTT  LIPID PANEL  LACTIC ACID, PLASMA  HEMOGLOBIN A1C  POCT ACTIVATED CLOTTING TIME  POCT ACTIVATED CLOTTING TIME  TROPONIN I (HIGH SENSITIVITY)  TROPONIN I (HIGH SENSITIVITY)     EKG  Ecg = sinus rhythm, rate 59, gross ST elevation in inferior leads with reciprocal depressions in aVL extending into V2 concerning for inferior infarct with RV extension.  Code STEMI activated.   RADIOLOGY Chest x-ray interpreted by myself and radiology report reviewed.  No acute pathology identified.    PROCEDURES:  Critical Care performed: Yes, see critical care procedure note(s)  .Critical Care  Performed by: Clarine Ozell LABOR, MD Authorized by: Clarine Ozell LABOR, MD   Critical care provider statement:    Critical care time (minutes):  45   Critical care was necessary to treat or prevent imminent or life-threatening deterioration of the following conditions:  Cardiac failure   Critical care was time spent personally by me on the following activities:  Development of treatment plan with patient or surrogate, discussions with consultants, evaluation of patient's response to treatment,  examination of patient, ordering and review of laboratory studies, ordering and review of radiographic studies, ordering and performing treatments and interventions, pulse oximetry, re-evaluation of patient's condition and review of old charts   I assumed direction of critical care for this patient from another provider in my specialty: no     Care discussed with: admitting provider      MEDICATIONS ORDERED IN ED: Medications  0.9 %  sodium chloride  infusion (20 mL/hr Intravenous New Bag/Given 06/22/24 0418)  free water  500 mL (has no administration in time range)  sodium chloride  flush (NS) 0.9 % injection 3 mL (has no administration in time range)  sodium chloride  flush (NS) 0.9 % injection 3 mL (has no administration in time range)  0.9 %  sodium chloride  infusion (has no administration in time range)  aspirin  chewable tablet 81 mg (has no administration in time range)  prasugrel  (EFFIENT ) tablet 10 mg (has no administration in time range)  metoprolol  succinate (TOPROL -XL) 24 hr tablet 25 mg (has no administration in time range)  atorvastatin  (LIPITOR ) tablet 80 mg (has no administration in time range)  losartan  (COZAAR ) tablet 25 mg (has no administration in time range)  aspirin  chewable tablet 324 mg (324 mg Oral Given 06/22/24 0411)  heparin  sodium (porcine) injection 3,450 Units (3,450 Units Intravenous Given 06/22/24 0425)  sodium chloride  0.9 % bolus 1,000 mL (1,000 mLs Intravenous New Bag/Given 06/22/24 0410)  ondansetron  (ZOFRAN ) injection 4 mg (4 mg Intravenous Given 06/22/24 0436)     IMPRESSION / MDM / ASSESSMENT AND PLAN / ED COURSE  I reviewed the triage vital signs and the nursing notes.                              DDX/MDM/AP: Differential diagnosis includes, but is not limited to, STEMI, less likely aortic dissection, PE.  Plan: - Code STEMI activated - Labs - Cardiac monitor, defibrillator pads - Chest x-ray - IV fluid - Avoiding nitroglycerin - Aspirin , heparin   bolus - cardiology c/s  Patient's presentation is most consistent with acute presentation with potential threat to life or bodily function.  The patient is on the cardiac monitor to evaluate for evidence of arrhythmia and/or significant heart rate changes.  ED course below.  Aspirin  and heparin  bolus given in the emergency department.  Patient remained hemodynamically stable, only mild bradycardia here, blood pressure improved with IV fluid.  Taken emergently to Cath Lab by cardiology, appreciate their assistance.  Found to have 99% blockage of RCA, DES placed.  Admitted to hospitalist service.  Clinical Course as of 06/22/24 [phone]  [facility]  Jun 22, 2024  0406 STEMI activated, STEMI order set placed, cardiology paged [MM]  0413 D/w Dr. Florencio of cardiology ECG reviewed Agrees w/ heparin , IVF, ASA En route to see pt [MM]    Clinical Course User Index [MM] Clarine Ozell LABOR, MD     FINAL CLINICAL IMPRESSION(S) / ED DIAGNOSES   Final diagnoses:  ST elevation myocardial infarction involving right coronary artery [facility])     Rx / DC Orders   ED Discharge Orders          Ordered    AMB Referral to Cardiac Rehabilitation - Phase II        06/22/24 0548             Note:  This document was prepared using Dragon voice recognition software and may include unintentional dictation errors.   Clarine Ozell LABOR, MD 06/22/24 [phone]

## 2024-06-22 NOTE — ED Notes (Signed)
Cardiologist is at bedside at this time

## 2024-06-22 NOTE — TOC Initial Note (Signed)
 Transition of Care [facility]) - Initial/Assessment Note    Patient Details  Name: Joy Carpenter MRN: [id number] Date of Birth: 05-27-1951  [facility]) CM/SW Contact:    Corrie JINNY Ruts, LCSW Phone Number: 06/22/2024, 1:43 PM  Clinical Narrative:                 Chart reviewed. The patient was admitted for STEMI. I consulted with the patient about any SDOH. I spoke with the patient at bed side today. I introduced myself, my role, and reason for consult. The patient reports that she is doing well. The patient reports that she has a PCP. The patient report that she lives with her granddaughter. The patient reports that she is able to complete daily living task independently. The patient reports that she drives her self to medical appointments. The patient reports that her granddaughter will assist during discharge. The patient reports that [facility] on [address] For her pharmacy and she does not have any copays. The patient reports that she has never had HH or been admitted into a SNF in the past. The patient reports that she has no equipment in the home.   The patient did not report any needs. TOC will follow the patient until discharge.     Barriers to Discharge: Continued Medical Work up   Patient Goals and CMS Choice            Expected Discharge Plan and Services       Living arrangements for the past 2 months: Single Family Home                                      Prior Living Arrangements/Services Living arrangements for the past 2 months: Single Family Home Lives with:: Other (Comment) (Patient reports that she lives with her granddaughter.) Patient language and need for interpreter reviewed:: Yes        Need for Family Participation in Patient Care: Yes (Comment)     Criminal Activity/Legal Involvement Pertinent to Current Situation/Hospitalization: No - Comment as needed  Activities of Daily Living      Permission  Sought/Granted                  Emotional Assessment Appearance:: Appears stated age Attitude/Demeanor/Rapport: Gracious, Engaged Affect (typically observed): Calm, Pleasant Orientation: : Oriented to Self, Oriented to Place, Oriented to  Time, Oriented to Situation Alcohol / Substance Use: Not Applicable Psych Involvement: No (comment)  Admission diagnosis:  STEMI involving right coronary artery (HCC) [I21.11] STEMI (ST elevation myocardial infarction) (HCC) [I21.3] Patient Active Problem List   Diagnosis Date Noted   STEMI involving right coronary artery (HCC) 06/22/2024   STEMI (ST elevation myocardial infarction) (HCC) 06/22/2024   S/P repair of paraesophageal hernia 01/16/2024   Heartburn    Postmenopausal bleeding    Abnormal endometrial ultrasound    Encounter for screening mammogram for malignant neoplasm of breast 09/02/2020   Hernia, hiatal 06/15/2020   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) 04/19/2020   Urinary tract infection with hematuria 01/23/2020   Abdominal pain 01/23/2020   Acquired hypothyroidism 10/11/2019   Need for vaccination against Streptococcus pneumoniae using pneumococcal conjugate vaccine 13 10/11/2019   Laceration of right lower extremity 10/11/2019   Right upper quadrant pain 06/17/2019   Gallbladder sludge 06/17/2019   Other specified diseases of gallbladder 06/17/2019   Gastroesophageal reflux  disease without esophagitis 04/30/2019   Vitamin D  deficiency 04/30/2019   Other fatigue 04/30/2019   Primary generalized (osteo)arthritis 12/26/2018   Atopic dermatitis 12/26/2018   Acute upper respiratory infection 08/27/2018   Carpal tunnel syndrome on both sides 08/27/2018   Chronic idiopathic constipation 05/18/2018   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/2018   Dysuria 05/18/2018   Left sided abdominal pain 01/04/2018   Primary insomnia 01/04/2018   Generalized anxiety disorder 01/04/2018   Tobacco use disorder 07/31/2016    Hyperlipidemia 07/31/2016   Atherosclerosis of native arteries of extremity with intermittent claudication (HCC) 07/31/2016   Abdominal aortic aneurysm (AAA) without rupture (HCC) 07/31/2016   Abdominal pain, epigastric    Abnormal findings-gastrointestinal tract    Gastritis    Other specified disease of esophagus    Encounter for long-term (current) use of medications    PCP:  Kristina Tinnie POUR, PA-C Pharmacy:   [facility] [phone] [address], [place] - [address] [place] [address] Phone: [phone] Fax: [phone]  [facility] [address] [place] [address] Phone: [phone] Fax: [phone]     Social Drivers of Health (SDOH) Social History: SDOH Screenings   Food Insecurity: No Food Insecurity (06/22/2024)  Housing: High Risk (06/22/2024)  Transportation Needs: No Transportation Needs (06/22/2024)  Utilities: Not At Risk (06/22/2024)  Alcohol Screen: Low Risk  (04/22/2023)  Depression (PHQ2-9): Low Risk  (08/01/2023)  Financial Resource Strain: Low Risk  (11/29/2023)   Received from [facility]  Social Connections: Unknown (06/22/2024)  Tobacco Use: High Risk (06/22/2024)   SDOH Interventions:     Readmission Risk Interventions     No data to display

## 2024-06-22 NOTE — Progress Notes (Signed)
 eLink Physician-Brief Progress Note Patient Name: Joy Carpenter DOB: 1951/03/08 MRN: [id number]   Date of Service  06/22/2024  HPI/Events of Note  73 year old with a history of essential hypertension, anxiety, smoking, CAD of the LAD and RCA, thoracoabdominal aortic aneurysm status post EVAR who presents to the emergency department with bilateral arm pain and chest discomfort associated with diaphoresis found to have inferior STEMI.  Patient had 99% blockage to the ostial RCA and returns to the ICU for postop management.  eICU Interventions  Aspirin , prasugrel , statin, tobacco cessation  Echocardiogram  DVT prophylaxis with early ambulation/SCDs GI prophylaxis not indicated     Intervention Category Evaluation Type: New Patient Evaluation  Joy Carpenter 06/22/2024, 6:10 AM

## 2024-06-22 NOTE — Progress Notes (Signed)
   06/22/24 0400  Spiritual Encounters  Type of Visit Initial  Care provided to: Pt and family  Conversation partners present during encounter Nurse;Physician  Referral source Code page  Reason for visit Code  OnCall Visit Yes   Chaplain responded to a Code Stroke page and found patient in the ED11 with granddaughter at bedside.  Chaplain offered a compassionate presence for patient and granddaughter and ensured granddaughter got to the places she needed to be while her grandmother was being cared for.  Granddaughter became tearful and Chaplain prayed with granddaughter.  Rev. Rana M. Nicholaus, M.Div. Chaplain Resident [facility]

## 2024-06-22 NOTE — Progress Notes (Signed)
   06/22/24 0700  Spiritual Encounters  Type of Visit Follow up  Care provided to: Pt and family  Conversation partners present during encounter Other (comment) Training and development officer)  Reason for visit Routine spiritual support  OnCall Visit Yes   Chaplain followed up with patient and family after a Code Stemi earlier in the evening.  Patient's daughter and granddaughter were at the bedside and patient shared she was feeling much better.  Chaplain celebrated with patient and family.    Rev. Rana M. Nicholaus, M.Div. Chaplain Resident [facility]

## 2024-06-22 NOTE — ED Notes (Signed)
 Pt placed in gown, yellow socks, and falls risk armband at this time. Pt's belongings given to family at bedside.

## 2024-06-22 NOTE — Consult Note (Signed)
 CARDIOLOGY CONSULT NOTE               Patient ID: Joy Carpenter MRN: [id number] DOB/AGE: [birth year] 73 y.o.  Admit date: 06/22/2024 Referring Physician   Dr Ozell Klein Emergency room Primary Physician [facility]  Primary Cardiologist [facility] cardiology Sheri hammock NP/Dr Agbor-Etang Reason for Consultation inferior STEMI  HPI: Patient presented ER with new onset unstable anginal symptoms bilateral arm pain chest discomfort diaphoresis cold some shortness of breath generalized weakness and sense of on ease.  Initial EKG suggested inferior STEMI code STEMI was initiated patient was given aspirin  and heparin .  Code STEMI team was activated patient remained hemodynamically stable brought to the cardiac Cath Lab found to have ostial RCA lesion which was intervened with DES stent from a 99% blockage down to 0 TIMI-3 flow was maintained throughout patient tolerated procedure well symptoms improved remained hemodynamically stable patient placed on prasugrel  and aspirin  transferred to ICU family made aware.  Hospitalist service contacted to help with management.  Patient has a history of COPD anxiety diastolic dysfunction and GERD patient have not had been found to have a descending type B abdominal aneurysm which was treated with EVAR at [facility] in 2022.  Unfortunately patient continues to smoke Dr. Cleatus with hospitalist service was contacted to help with medical management  Review of systems complete and found to be negative unless listed above     Past Medical History:  Diagnosis Date   Acquired hypothyroidism    Actinic keratosis 03/28/2021   right upper arm, bx proven   Anxiety    a.) on BZO (alproazolam) PRN   Carpal tunnel syndrome 2019   Coronary artery disease involving native coronary artery    of the LAD and RCA noted on chest CT   Depression    Diastolic dysfunction    Emphysema of lung (HCC)    GERD (gastroesophageal reflux disease)    Herpes     History of hiatal hernia    Hypertensive chronic kidney disease    Insomnia    a.) on hypnotic (zolpidem ) PRN   Long-term use of aspirin  therapy    Mixed hyperlipidemia    Osteoporosis    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   Primary hypertension    Pulmonary nodules    SCC (squamous cell carcinoma) 03/28/2021   left dorsal hand, EDC   SCC (squamous cell carcinoma) 08/01/2023   SCC IS, L ant shoulder, EDC 08/27/2023   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   left anterior shoulder, EDC 08/27/23   Stress headaches (almost daily)    Thoracoabdominal aortic aneurysm (HCC)    a.) s/p 4v FEVAR (CA, SMA, RRA, LRA) and proximal TEVAR extention with BILATERAL iliac limb extensions on 11/16/2020   Vertigo 2010   Vitamin D  deficiency    Weakness of both legs (intermittent)     Past Surgical History:  Procedure Laterality Date   COLONOSCOPY     COLONOSCOPY WITH PROPOFOL  N/A 06/14/2016   Procedure: COLONOSCOPY WITH PROPOFOL ;  Surgeon: Rogelia Copping, MD;  Location: [facility];  Service: Endoscopy;  Laterality: N/A;   ESOPHAGOGASTRODUODENOSCOPY (EGD) WITH PROPOFOL  N/A 06/14/2016   Procedure: ESOPHAGOGASTRODUODENOSCOPY (EGD) WITH PROPOFOL ;  Surgeon: Rogelia Copping, MD;  Location: [facility];  Service: Endoscopy;  Laterality: N/A;   ESOPHAGOGASTRODUODENOSCOPY (EGD) WITH PROPOFOL  N/A 11/27/2021   Procedure: ESOPHAGOGASTRODUODENOSCOPY (EGD) WITH BIOPSY;  Surgeon: Copping Rogelia, MD;  Location: [facility];  Service: Endoscopy;  Laterality: N/A;   HYSTEROSCOPY WITH D &  C N/A 07/18/2021   Procedure: DILATATION AND CURETTAGE /HYSTEROSCOPY;  Surgeon: Lake Read, MD;  Location: [facility];  Service: Gynecology;  Laterality: N/A;   INSERTION OF MESH  01/16/2024   Procedure: INSERTION OF MESH;  Surgeon: Jordis Laneta FALCON, MD;  Location: [facility];  Service: General;;   THORACIC AORTIC ANEURYSM REPAIR N/A 11/16/2020   Procedure: 4V FEVAR (CA, SMA, RRA, LRA) WITH PROXIMAL TEVAR  EXTENSION WITH BILATERAL ILIAC LIMB EXTENSIONS; Location: [facility]; Surgeon; Oneil Phlegm, MD   TUBAL LIGATION     XI ROBOTIC ASSISTED PARAESOPHAGEAL HERNIA REPAIR N/A 01/16/2024   Procedure: REPAIR, HERNIA, PARAESOPHAGEAL, ROBOT-ASSISTED;  Surgeon: Jordis Laneta FALCON, MD;  Location: [facility];  Service: General;  Laterality: N/A;    Medications Prior to Admission  Medication Sig Dispense Refill Last Dose/Taking   ALPRAZolam  (XANAX ) 0.25 MG tablet Take 1 tablet by mouth as needed for anxiety 30 tablet 2    amLODipine  (NORVASC ) 2.5 MG tablet TAKE ONE TABLET BY MOUTH ONCE DAILY HYPERTENSION 90 tablet 1    aspirin  81 MG chewable tablet Chew 81 mg by mouth daily.      atorvastatin  (LIPITOR ) 40 MG tablet TAKE 1 TABLET BY MOUTH EVERY DAY 90 tablet 3    gabapentin  (NEURONTIN ) 300 MG capsule Take 1 capsule (300 mg total) by mouth 3 (three) times daily. 30 capsule 0    levothyroxine  (SYNTHROID ) 50 MCG tablet TAKE 1 TABLET BY MOUTH EVERY DAY IN THE MORNING 90 tablet 1    losartan  (COZAAR ) 25 MG tablet Take 0.5 tablets (12.5 mg total) by mouth daily. 90 tablet 3    traMADol  (ULTRAM ) 50 MG tablet TAKE 1/2 TO 1 TABLET BY MOUTH DAILY AS NEEDED FOR PAIN 30 tablet 0    valACYclovir  (VALTREX ) 500 MG tablet TAKE 1 TABLET (500 MG TOTAL) BY MOUTH DAILY. 90 tablet 1    zolpidem  (AMBIEN ) 10 MG tablet TAKE 1 TABLET BY MOUTH EVERYDAY AT BEDTIME 30 tablet 2    Social History   Socioeconomic History   Marital status: Divorced    Spouse name: Not on file   Number of children: 2   Years of education: Not on file   Highest education level: Not on file  Occupational History   Not on file  Tobacco Use   Smoking status: Every Day    Current packs/day: 1.00    Average packs/day: 1 pack/day for 30.0 years (30.0 ttl pk-yrs)    Types: Cigarettes    Passive exposure: Past   Smokeless tobacco: Never   Tobacco comments:    1 pack daily  Vaping Use   Vaping status: Never Used  Substance and Sexual Activity   Alcohol use: Not  Currently   Drug use: Not Currently   Sexual activity: Not Currently    Birth control/protection: Post-menopausal  Other Topics Concern   Not on file  Social History Narrative   Lives in [place] with grand-daughter, currently staying with boyfriend   Social Drivers of Health   Financial Resource Strain: Low Risk  (11/29/2023)   Received from [facility]   Overall Financial Resource Strain (CARDIA)    Difficulty of Paying Living Expenses: Not very hard  Food Insecurity: No Food Insecurity (01/16/2024)   Hunger Vital Sign    Worried About Running Out of Food in the Last Year: Never true    Ran Out of Food in the Last Year: Never true  Transportation Needs: Unknown (01/16/2024)   PRAPARE - Transportation    Lack of  Transportation (Medical): No    Lack of Transportation (Non-Medical): Not on file  Physical Activity: Not on file  Stress: Not on file  Social Connections: Not on file  Intimate Partner Violence: Not At Risk (05/06/2024)   Received from [facility]   Humiliation, Afraid, Rape, and Kick questionnaire    Within the last year, have you been afraid of your partner or ex-partner?: No    Within the last year, have you been humiliated or emotionally abused in other ways by your partner or ex-partner?: No    Within the last year, have you been kicked, hit, slapped, or otherwise physically hurt by your partner or ex-partner?: No    Within the last year, have you been raped or forced to have any kind of sexual activity by your partner or ex-partner?: No    Family History  Problem Relation Age of Onset   Leukemia Mother    Aneurysm Other       Review of systems complete and found to be negative unless listed above      PHYSICAL EXAM  General: Well developed, well nourished, in no acute distress HEENT:  Normocephalic and atramatic Neck:  No JVD.  Lungs: Clear bilaterally to auscultation and percussion. Heart: HRRR . Normal S1 and S2 without gallops  or murmurs.  Abdomen: Bowel sounds are positive, abdomen soft and non-tender  Msk:  Back normal, normal gait. Normal strength and tone for age. Extremities: No clubbing, cyanosis or edema.   Neuro: Alert and oriented X 3. Psych:  Good affect, responds appropriately  Labs:   Lab Results  Component Value Date   WBC 7.9 06/22/2024   HGB 11.9 (L) 06/22/2024   HCT 36.6 06/22/2024   MCV 101.7 (H) 06/22/2024   PLT 161 06/22/2024    Recent Labs  Lab 06/22/24 0400  NA 142  K 4.0  CL 110  CO2 25  BUN 19  CREATININE 0.98  CALCIUM  8.6*  PROT 6.5  BILITOT 0.6  ALKPHOS 120  ALT 13  AST 20  GLUCOSE 136*   No results found for: CKTOTAL, CKMB, CKMBINDEX, TROPONINI  Lab Results  Component Value Date   CHOL 138 06/22/2024   CHOL 135 04/22/2023   CHOL 178 07/02/2022   Lab Results  Component Value Date   HDL 66 06/22/2024   HDL 53 04/22/2023   HDL 58 07/02/2022   Lab Results  Component Value Date   LDLCALC 58 06/22/2024   LDLCALC 73 04/22/2023   LDLCALC 108 (H) 07/02/2022   Lab Results  Component Value Date   TRIG 70 06/22/2024   TRIG 46 04/22/2023   TRIG 64 07/02/2022   Lab Results  Component Value Date   CHOLHDL 2.1 06/22/2024   CHOLHDL 2.5 04/22/2023   CHOLHDL 3.3 06/01/2019   No results found for: LDLDIRECT    Radiology: [facility] Chest Port 1 View Result Date: 06/22/2024 CLINICAL DATA:  Mild cardial infarction. EXAM: PORTABLE CHEST 1 VIEW COMPARISON:  11/29/2023 FINDINGS: The cardiopericardial silhouette is within normal limits for size. Descending thoracic aortic stent device again noted. The lungs are clear without focal pneumonia, edema, pneumothorax or pleural effusion. Hiatal hernia seen previously less readily evident today. No acute bony abnormality. Telemetry leads overlie the chest. IMPRESSION: No active disease. Electronically Signed   By: Camellia Candle M.D.   On: 06/22/2024 05:00    EKG: Normal sinus rhythm ST elevation inferiorly reciprocal  depression laterally rate of around 70  ASSESSMENT AND PLAN:  STEMI  inferior Hypertension Hyperlipidemia Smoking History of thoracoabdominal EVAR Chronic renal insufficiency Thyroid  disease . Plan Status post cath PCI and stent ostial RCA DES continue prasugrel  aspirin  statin Recommend cardiac rehab Patient may be a candidate for early discharge in 24 to 48 hours post STEMI Increase statin therapy to 80 mg Lipitor  LDL goal less than 55 consider adding Zetia if not at goal Advised patient to refrain from tobacco abuse Continue hypertension management on metoprolol  and losartan  therapy Echocardiogram for assessment of left ventricular function wall motion and valvular structures Follow-up troponins EKGs Will transfer cardiology care to [facility] who her primary cardiologist  Signed: Cara JONETTA Lovelace MD 06/22/2024, 5:55 AM

## 2024-06-22 NOTE — Progress Notes (Signed)
 Rounding Note   Patient Name: Joy Carpenter Date of Encounter: 06/22/2024  [facility] Cardiologist: Redell Cave, MD   Subjective Patient presented early this morning with bilateral upper extremity discomfort, numbness, and tingling.  Found to have inferior STEMI and taken for emergent catheterization which revealed 99% ostial RCA lesion with successful DES placement.  At time of exam, patient reports numbness of her right hand where TR band was placed.  She has loss of sensation of the thumb, index, and middle fingers with significant bruising/swelling.  Reports that upper extremity symptoms have resolved.  Never had chest pain.  No shortness of breath.  Scheduled Meds:  [START ON 06/23/2024] aspirin   81 mg Oral Daily   atorvastatin   80 mg Oral Daily   free water   500 mL Oral Once   levothyroxine   50 mcg Oral Q0600   losartan   25 mg Oral Daily   metoprolol  succinate  25 mg Oral Daily   [START ON 06/23/2024] prasugrel   10 mg Oral Daily   sodium chloride  flush  3 mL Intravenous Q12H   Continuous Infusions:  sodium chloride  20 mL/hr (06/22/24 0418)   sodium chloride      PRN Meds: sodium chloride , sodium chloride  flush, traMADol    Vital Signs  Vitals:   06/22/24 0900 06/22/24 1000 06/22/24 1100 06/22/24 1200  BP: 128/79 109/78 102/72 117/82  Pulse: 80 73 73 72  Resp: 16 (!) 21 18 20   Temp:    97.6 F (36.4 C)  TempSrc:    Oral  SpO2: 99% 100% 99% 99%  Weight:      Height:        Intake/Output Summary (Last 24 hours) at 06/22/2024 1339 Last data filed at 06/22/2024 1100 Gross per 24 hour  Intake 240 ml  Output 1325 ml  Net -1085 ml      06/22/2024    6:00 AM 06/22/2024    4:11 AM 05/04/2024   11:08 AM  Last 3 Weights  Weight (lbs) 131 lb 13.4 oz 127 lb 123 lb 9.6 oz  Weight (kg) 59.8 kg 57.607 kg 56.065 kg      Telemetry Sinus rhythm- Personally Reviewed  ECG  Postprocedure EKG shows sinus rhythm with improvement in inferior ST  elevations- Personally Reviewed  Physical Exam  GEN: No acute distress.   Neck: No JVD Cardiac: RRR, no murmurs, rubs, or gallops.  Right hand swollen and bruised.  Radial pulse is palpable. Respiratory: Clear to auscultation bilaterally. GI: Soft, nontender, non-distended  MS: No edema; No deformity. Neuro:  Nonfocal  Psych: Normal affect   Labs High Sensitivity Troponin:   Recent Labs  Lab 06/22/24 0400 06/22/24 0636 06/22/24 0952  TROPONINIHS 5 1,427* 11,072*     Chemistry Recent Labs  Lab 06/22/24 0400  NA 142  K 4.0  CL 110  CO2 25  GLUCOSE 136*  BUN 19  CREATININE 0.98  CALCIUM  8.6*  PROT 6.5  ALBUMIN 3.6  AST 20  ALT 13  ALKPHOS 120  BILITOT 0.6  GFRNONAA >60  ANIONGAP 7    Lipids  Recent Labs  Lab 06/22/24 0400  CHOL 138  TRIG 70  HDL 66  LDLCALC 58  CHOLHDL 2.1    Hematology Recent Labs  Lab 06/22/24 0400  WBC 7.9  RBC 3.60*  HGB 11.9*  HCT 36.6  MCV 101.7*  MCH 33.1  MCHC 32.5  RDW 13.5  PLT 161   Thyroid  No results for input(s): TSH, FREET4 in the last 168 hours.  BNPNo results for input(s): BNP, PROBNP in the last 168 hours.  DDimer No results for input(s): DDIMER in the last 168 hours.   Radiology  DG Chest Port 1 View Result Date: 06/22/2024 CLINICAL DATA:  Mild cardial infarction. EXAM: PORTABLE CHEST 1 VIEW COMPARISON:  11/29/2023 FINDINGS: The cardiopericardial silhouette is within normal limits for size. Descending thoracic aortic stent device again noted. The lungs are clear without focal pneumonia, edema, pneumothorax or pleural effusion. Hiatal hernia seen previously less readily evident today. No acute bony abnormality. Telemetry leads overlie the chest. IMPRESSION: No active disease. Electronically Signed   By: Camellia Candle M.D.   On: 06/22/2024 05:00    Cardiac Studies  12/2022 Echo complete 1. Left ventricular ejection fraction, by estimation, is 60 to 65%. The  left ventricle has normal function. The  left ventricle has no regional  wall motion abnormalities. Left ventricular diastolic parameters are  consistent with Grade I diastolic  dysfunction (impaired relaxation). The average left ventricular global  longitudinal strain is -18.7 %.   2. Right ventricular systolic function is normal. The right ventricular  size is normal. Tricuspid regurgitation signal is inadequate for assessing  PA pressure.   3. The mitral valve is normal in structure. Mild mitral valve  regurgitation. No evidence of mitral stenosis.   4. The aortic valve is tricuspid. Aortic valve regurgitation is not  visualized. Aortic valve sclerosis/calcification is present, without any  evidence of aortic stenosis.   5. The inferior vena cava is normal in size with greater than 50%  respiratory variability, suggesting right atrial pressure of 3 mmHg.   Patient Profile   73 y.o. female with history of CAD, hypertension, hyperlipidemia, thoracic abdominal aortic aneurysm s/p four-vessel for EVAR (CEA, SMA, RA, LRA) proximal T EVAR extension and bilateral iliac limb extension s/p endograft (11/2020), and ongoing tobacco use x 20+ years who presented to [facility] with bilateral upper extremity discomfort, numbness and tingling, found to have acute inferior STEMI.  Assessment & Plan   Acute STEMI Coronary artery disease - Presented to [facility] early this morning with bilateral upper extremity discomfort, numbness and tingling, with EKG demonstrating inferior STEMI - Emergent cardiac catheterization revealed 99% ostial RCA lesion with successful DES placement - Echo ordered - Continue DAPT with aspirin  and Effient  for at least 12 months - Continue atorvastatin  80 mg daily, increased from home dose of 40 mg daily - Continue metoprolol  succinate 25 mg daily - On exam, right hand is swollen and bruised.  Radial pulse is palpable.  Patient without sensation in her thumb, index, and middle fingers.  Recommended removal of TR band and  elevation of the hand with close monitoring.  Hypertension - Blood pressure well-controlled - Continue losartan  25 mg daily and metoprolol  succinate 25 mg daily  Hyperlipidemia - LDL 58, at goal - Continue atorvastatin  80 mg daily  Tobacco abuse - Recommend cessation  For questions or updates, please contact [facility] Please consult [email or web address] for contact info under       Signed, Lesley LITTIE Maffucci, PA-C  06/22/2024, 1:39 PM

## 2024-06-22 NOTE — Plan of Care (Signed)

## 2024-06-22 NOTE — ED Triage Notes (Signed)
 Pt reports chest burning that began earlier tonight, pt also repots tingling in bilateral arms, speech clear, able to move all extremities w/out deficits. Denies cardiac hx

## 2024-06-22 NOTE — H&P (Signed)
 History and Physical    Joy Carpenter FMW:[id number] DOB: July 03, 1951 DOA: 06/22/2024  DOS: the patient was seen and examined on 06/22/2024  PCP: Kristina Tinnie POUR, PA-C   Patient coming from: Home  I have personally briefly reviewed patient's old medical records in [facility]  Chief Complaint: Chest pain and shortness of breath  HPI: Joy Carpenter is a pleasant 73 y.o. female with medical history significant for HTN, HLD, hypothyroidism, COPD aneurysm s/p EVAR and GERD chest pain, diaphoresis and some shortness of breath started last night around 2:15 AM.  Her chest pain was pressure-like radiating to both arms also with some numbness.  She was taken to cardiac catheterization lab and underwent PCI and stent.  Hospitalist service was consulted for evaluation for admission post cath hospitalist management.  ED Course: Upon arrival to the ED, patient is found to be hypotensive around 70/47, bradycardic around 55 ST elevation in inferior leads with reciprocal depression in aVL extending to V2 concerning for inferior infarct with RV extension.  Code STEMI was activated in the emergency room.  Chest x-ray with out acute pathology.  Patient was started on heparin  drip, IV fluid and aspirin .  Patient was taken to cardiac Cath Lab.  She is now s/p cath PCI and stent ostial RCA DES continue prasugrel  and aspirin  and statin.  Stay on in the hospital for next 24 to 48 hours.  Echocardiogram.  Will transfer cardiology care to [facility] who is her primary cardiologist.  Review of Systems:  ROS  All other systems negative except as noted in the HPI.  Past Medical History:  Diagnosis Date   Acquired hypothyroidism    Actinic keratosis 03/28/2021   right upper arm, bx proven   Anxiety    a.) on BZO (alproazolam) PRN   Carpal tunnel syndrome 2019   Coronary artery disease involving native coronary artery    of the LAD and RCA noted on chest CT   Depression    Diastolic  dysfunction    Emphysema of lung (HCC)    GERD (gastroesophageal reflux disease)    Herpes    History of hiatal hernia    Hypertensive chronic kidney disease    Insomnia    a.) on hypnotic (zolpidem ) PRN   Long-term use of aspirin  therapy    Mixed hyperlipidemia    Osteoporosis    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   Primary hypertension    Pulmonary nodules    SCC (squamous cell carcinoma) 03/28/2021   left dorsal hand, EDC   SCC (squamous cell carcinoma) 08/01/2023   SCC IS, L ant shoulder, EDC 08/27/2023   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   left anterior shoulder, EDC 08/27/23   Stress headaches (almost daily)    Thoracoabdominal aortic aneurysm (HCC)    a.) s/p 4v FEVAR (CA, SMA, RRA, LRA) and proximal TEVAR extention with BILATERAL iliac limb extensions on 11/16/2020   Vertigo 2010   Vitamin D  deficiency    Weakness of both legs (intermittent)     Past Surgical History:  Procedure Laterality Date   COLONOSCOPY     COLONOSCOPY WITH PROPOFOL  N/A 06/14/2016   Procedure: COLONOSCOPY WITH PROPOFOL ;  Surgeon: Rogelia Copping, MD;  Location: [facility];  Service: Endoscopy;  Laterality: N/A;   ESOPHAGOGASTRODUODENOSCOPY (EGD) WITH PROPOFOL  N/A 06/14/2016   Procedure: ESOPHAGOGASTRODUODENOSCOPY (EGD) WITH PROPOFOL ;  Surgeon: Rogelia Copping, MD;  Location: [facility];  Service: Endoscopy;  Laterality: N/A;   ESOPHAGOGASTRODUODENOSCOPY (EGD) WITH PROPOFOL  N/A  11/27/2021   Procedure: ESOPHAGOGASTRODUODENOSCOPY (EGD) WITH BIOPSY;  Surgeon: Jinny Carmine, MD;  Location: [facility];  Service: Endoscopy;  Laterality: N/A;   HYSTEROSCOPY WITH D & C N/A 07/18/2021   Procedure: DILATATION AND CURETTAGE /HYSTEROSCOPY;  Surgeon: Lake Read, MD;  Location: [facility];  Service: Gynecology;  Laterality: N/A;   INSERTION OF MESH  01/16/2024   Procedure: INSERTION OF MESH;  Surgeon: Jordis Laneta FALCON, MD;  Location: [facility];  Service: General;;   THORACIC AORTIC  ANEURYSM REPAIR N/A 11/16/2020   Procedure: 4V FEVAR (CA, SMA, RRA, LRA) WITH PROXIMAL TEVAR EXTENSION WITH BILATERAL ILIAC LIMB EXTENSIONS; Location: [facility]; Surgeon; Oneil Phlegm, MD   TUBAL LIGATION     XI ROBOTIC ASSISTED PARAESOPHAGEAL HERNIA REPAIR N/A 01/16/2024   Procedure: REPAIR, HERNIA, PARAESOPHAGEAL, ROBOT-ASSISTED;  Surgeon: Jordis Laneta FALCON, MD;  Location: [facility];  Service: General;  Laterality: N/A;     reports that she has been smoking cigarettes. She has a 30 pack-year smoking history. She has been exposed to tobacco smoke. She has never used smokeless tobacco. She reports that she does not currently use alcohol. She reports that she does not currently use drugs.  No Known Allergies  Family History  Problem Relation Age of Onset   Leukemia Mother    Aneurysm Other     Prior to Admission medications   Medication Sig Start Date End Date Taking? Authorizing Provider  ALPRAZolam  (XANAX ) 0.25 MG tablet Take 1 tablet by mouth as needed for anxiety 04/30/24   McDonough, Lauren K, PA-C  amLODipine  (NORVASC ) 2.5 MG tablet TAKE ONE TABLET BY MOUTH ONCE DAILY HYPERTENSION 03/09/24   McDonough, Tinnie POUR, PA-C  aspirin  81 MG chewable tablet Chew 81 mg by mouth daily. 11/21/20   [provider]  atorvastatin  (LIPITOR ) 40 MG tablet TAKE 1 TABLET BY MOUTH EVERY DAY 06/05/24   Darliss Rogue, MD  gabapentin  (NEURONTIN ) 300 MG capsule Take 1 capsule (300 mg total) by mouth 3 (three) times daily. 03/16/24   Pabon, Diego F, MD  levothyroxine  (SYNTHROID ) 50 MCG tablet TAKE 1 TABLET BY MOUTH EVERY DAY IN THE MORNING 03/09/24   McDonough, Tinnie POUR, PA-C  losartan  (COZAAR ) 25 MG tablet Take 0.5 tablets (12.5 mg total) by mouth daily. 01/08/24   Gerard Frederick, NP  traMADol  (ULTRAM ) 50 MG tablet TAKE 1/2 TO 1 TABLET BY MOUTH DAILY AS NEEDED FOR PAIN 06/03/24   McDonough, Lauren K, PA-C  valACYclovir  (VALTREX ) 500 MG tablet TAKE 1 TABLET (500 MG TOTAL) BY MOUTH DAILY. 08/23/23   McDonough, Tinnie POUR,  PA-C  zolpidem  (AMBIEN ) 10 MG tablet TAKE 1 TABLET BY MOUTH EVERYDAY AT BEDTIME 06/11/24   Kristina Tinnie POUR, PA-C    Physical Exam: Vitals:   06/22/24 0700 06/22/24 0715 06/22/24 0722 06/22/24 0800  BP: 119/76 106/73  108/78  Pulse:  73 81 68  Resp: 15 18 16 16   Temp:    97.6 F (36.4 C)  TempSrc:    Oral  SpO2:  100% 100% 100%  Weight:      Height:        Physical Exam   Constitutional: Alert, awake, calm, comfortable HEENT: Neck supple Respiratory: Clear to auscultation B/L, no wheezing, no rales.  Cardiovascular: Regular rate and rhythm, no murmurs / rubs / gallops. No extremity edema. 2+ pedal pulses. No carotid bruits.  Abdomen: Soft, no tenderness, Bowel sounds positive.  Musculoskeletal: no clubbing / cyanosis. Good ROM, no contractures. Normal muscle tone.  Skin: no rashes, lesions, ulcers.  Neurologic: CN 2-12 grossly intact. Sensation intact, No focal deficit identified Psychiatric: Alert and oriented x 3. Normal mood.    Labs on Admission: I have personally reviewed following labs and imaging studies  CBC: Recent Labs  Lab 06/22/24 0400  WBC 7.9  NEUTROABS 4.6  HGB 11.9*  HCT 36.6  MCV 101.7*  PLT 161   Basic Metabolic Panel: Recent Labs  Lab 06/22/24 0400  NA 142  K 4.0  CL 110  CO2 25  GLUCOSE 136*  BUN 19  CREATININE 0.98  CALCIUM  8.6*   GFR: Estimated Creatinine Clearance: 49.7 mL/min (by C-G formula based on SCr of 0.98 mg/dL). Liver Function Tests: Recent Labs  Lab 06/22/24 0400  AST 20  ALT 13  ALKPHOS 120  BILITOT 0.6  PROT 6.5  ALBUMIN 3.6   No results for input(s): LIPASE, AMYLASE in the last 168 hours. No results for input(s): AMMONIA in the last 168 hours. Coagulation Profile: Recent Labs  Lab 06/22/24 0400  INR 1.0   Cardiac Enzymes: Recent Labs  Lab 06/22/24 0400 06/22/24 0636  TROPONINIHS 5 1,427*   BNP (last 3 results) No results for input(s): BNP in the last 8760 hours. HbA1C: No results for  input(s): HGBA1C in the last 72 hours. CBG: Recent Labs  Lab 06/22/24 0600  GLUCAP 101*   Lipid Profile: Recent Labs    06/22/24 0400  CHOL 138  HDL 66  LDLCALC 58  TRIG 70  CHOLHDL 2.1   Thyroid  Function Tests: No results for input(s): TSH, T4TOTAL, FREET4, T3FREE, THYROIDAB in the last 72 hours. Anemia Panel: No results for input(s): VITAMINB12, FOLATE, FERRITIN, TIBC, IRON, RETICCTPCT in the last 72 hours. Urine analysis:    Component Value Date/Time   APPEARANCEUR Clear 06/21/2022 1404   GLUCOSEU Negative 06/21/2022 1404   BILIRUBINUR Negative 12/05/2023 0947   BILIRUBINUR Negative 06/21/2022 1404   PROTEINUR Negative 12/05/2023 0947   PROTEINUR Negative 06/21/2022 1404   UROBILINOGEN 0.2 12/05/2023 0947   NITRITE Negative 12/05/2023 0947   NITRITE Negative 06/21/2022 1404   LEUKOCYTESUR Trace (A) 12/05/2023 0947   LEUKOCYTESUR Trace (A) 06/21/2022 1404    Radiological Exams on Admission: I have personally reviewed images DG Chest Port 1 View Result Date: 06/22/2024 CLINICAL DATA:  Mild cardial infarction. EXAM: PORTABLE CHEST 1 VIEW COMPARISON:  11/29/2023 FINDINGS: The cardiopericardial silhouette is within normal limits for size. Descending thoracic aortic stent device again noted. The lungs are clear without focal pneumonia, edema, pneumothorax or pleural effusion. Hiatal hernia seen previously less readily evident today. No acute bony abnormality. Telemetry leads overlie the chest. IMPRESSION: No active disease. Electronically Signed   By: Camellia Candle M.D.   On: 06/22/2024 05:00    EKG: My personal interpretation of EKG shows: Normal sinus with ST elevation inferiorly with reciprocal depression laterally.    Assessment/Plan Principal Problem:   STEMI involving right coronary artery (HCC) Active Problems:   Tobacco use disorder   Hyperlipidemia   STEMI (ST elevation myocardial infarction) (HCC)    Assessment and  Plan: 73 year old female with history of HTN, HLD, aortic aneurysm s/p EVAR who came into the hospital for acute chest pain due to acute ST elevation myocardial infarction.  1.  Acute ST elevation myocardial infarction - S/p cardiac cath and stent placement on ostial RCA. - Cardiology advised for prasugrel , aspirin  and statin. - Monitor in stepdown unit for 24 to 48 hours - Patient is pain-free at this point - Transfer cardiology service to [facility]  2.  HTN/HLD - Resume home medications - Monitor blood pressure - Continue metoprolol  and losartan  - LDL goal is 55, Lipitor  to 80 mg daily  3.  Chronic smoking - Extensive counseling was done for smoking cessation  4.  COPD not on home oxygen - Continue to monitor - Maintain oxygen to maintain saturation more than 90%  5.  History of abdominal aortic aneurysm s/p EVAR - Stable     DVT prophylaxis: SCDs Code Status: Full Code Family Communication: Daughter and granddaughter were at bedside Disposition Plan: Home Consults called: Cardiology Admission status: Inpatient, Step Down Unit   Nena Rebel, MD [facility] 06/22/2024, 8:21 AM

## 2024-06-22 NOTE — ED Notes (Signed)
 Code STEMI called to [facility] spoke with [facility]

## 2024-06-22 NOTE — Progress Notes (Addendum)
 Pt complaining of tingling to R hand where TR band was placed, all the air has been removed. TR band due to be removed at 10:45a.m. on exam hand cool to touch, swollen/bruised, without sensation in her thumb, index, and middle fingers. Pt able to move all her fingers. Pt states  I can't feel my hand. Lesley Maffucci PA notified. Per PA okay to remove TR band now and will come assess pt. Will continue to monitor closely.

## 2024-06-23 ENCOUNTER — Inpatient Hospital Stay (HOSPITAL_COMMUNITY)
Admit: 2024-06-23 | Discharge: 2024-06-23 | Disposition: A | Discharge status: Home / Self Care | Attending: Internal Medicine | Admitting: Internal Medicine

## 2024-06-23 ENCOUNTER — Inpatient Hospital Stay: Admit: 2024-06-23

## 2024-06-23 DIAGNOSIS — T148XXA Other injury of unspecified body region, initial encounter: Secondary | ICD-10-CM | POA: Insufficient documentation

## 2024-06-23 DIAGNOSIS — I959 Hypotension, unspecified: Secondary | ICD-10-CM

## 2024-06-23 DIAGNOSIS — D696 Thrombocytopenia, unspecified: Secondary | ICD-10-CM | POA: Insufficient documentation

## 2024-06-23 DIAGNOSIS — E785 Hyperlipidemia, unspecified: Secondary | ICD-10-CM | POA: Diagnosis not present

## 2024-06-23 DIAGNOSIS — I9589 Other hypotension: Secondary | ICD-10-CM | POA: Diagnosis not present

## 2024-06-23 DIAGNOSIS — I739 Peripheral vascular disease, unspecified: Secondary | ICD-10-CM | POA: Diagnosis not present

## 2024-06-23 DIAGNOSIS — F17200 Nicotine dependence, unspecified, uncomplicated: Secondary | ICD-10-CM

## 2024-06-23 DIAGNOSIS — I2111 ST elevation (STEMI) myocardial infarction involving right coronary artery: Secondary | ICD-10-CM

## 2024-06-23 DIAGNOSIS — I255 Ischemic cardiomyopathy: Secondary | ICD-10-CM | POA: Diagnosis not present

## 2024-06-23 LAB — ECHOCARDIOGRAM COMPLETE
AR max vel: 2.4 cm2
AV Area VTI: 2.32 cm2
AV Area mean vel: 2.07 cm2
AV Mean grad: 3 mmHg
AV Peak grad: 5 mmHg
Ao pk vel: 1.12 m/s
Area-P 1/2: 3.97 cm2
Height: 67 in
MV VTI: 1.85 cm2
S' Lateral: 2.5 cm
Weight: 2109.36 [oz_av]

## 2024-06-23 LAB — BASIC METABOLIC PANEL WITH GFR
Anion gap: 9 (ref 5–15)
BUN: 17 mg/dL (ref 8–23)
CO2: 25 mmol/L (ref 22–32)
Calcium: 8.6 mg/dL — ABNORMAL LOW (ref 8.9–10.3)
Chloride: 105 mmol/L (ref 98–111)
Creatinine, Ser: 1 mg/dL (ref 0.44–1.00)
GFR, Estimated: >60 mL/min (ref >60)
Glucose, Bld: 110 mg/dL — ABNORMAL HIGH (ref 70–99)
Potassium: 4.3 mmol/L (ref 3.5–5.1)
Sodium: 139 mmol/L (ref 135–145)

## 2024-06-23 LAB — CBC
HCT: 32.6 % — ABNORMAL LOW (ref 36.0–46.0)
Hemoglobin: 10.8 g/dL — ABNORMAL LOW (ref 12.0–15.0)
MCH: 33 pg (ref 26.0–34.0)
MCHC: 33.1 g/dL (ref 30.0–36.0)
MCV: 99.7 fL (ref 80.0–100.0)
Platelets: 139 K/uL — ABNORMAL LOW (ref 150–400)
RBC: 3.27 MIL/uL — ABNORMAL LOW (ref 3.87–5.11)
RDW: 13.6 % (ref 11.5–15.5)
WBC: 7.9 K/uL (ref 4.0–10.5)
nRBC: 0 % (ref 0.0–0.2)

## 2024-06-23 LAB — TROPONIN I (HIGH SENSITIVITY): Troponin I (High Sensitivity): 4380 ng/L (ref <18)

## 2024-06-23 MED ORDER — PRASUGREL HCL 10 MG PO TABS
5.0000 mg | ORAL_TABLET | Freq: Every day | ORAL | Status: DC
  Administered 2024-06-24: 5 mg via ORAL
  Filled 2024-06-23: qty 1

## 2024-06-23 MED ORDER — SODIUM CHLORIDE 0.9 % IV BOLUS
250.0000 mL | Freq: Once | INTRAVENOUS | Status: AC
Start: 2024-06-23 — End: 2024-06-23
  Administered 2024-06-23: 250 mL via INTRAVENOUS

## 2024-06-23 MED ORDER — CHLORHEXIDINE GLUCONATE CLOTH 2 % EX PADS
6.0000 | MEDICATED_PAD | Freq: Every day | CUTANEOUS | Status: DC
  Administered 2024-06-23 – 2024-06-24 (×2): 6 via TOPICAL

## 2024-06-23 MED ORDER — OXYCODONE HCL 5 MG PO TABS
5.0000 mg | ORAL_TABLET | Freq: Four times a day (QID) | ORAL | Status: DC | PRN
  Administered 2024-06-23 – 2024-06-24 (×3): 5 mg via ORAL
  Filled 2024-06-23 (×3): qty 1

## 2024-06-23 NOTE — Progress Notes (Signed)
 Rounding Note   Patient Name: Joy Carpenter Date of Encounter: 06/23/2024  [facility] Cardiologist: Redell Cave, MD   Subjective Patient's blood pressures have been measured very low.  Patient is asymptomatic to this.  On recheck, blood pressures are quite different and left versus right upper extremity.  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  Recommend checking blood pressures in the leg.  Patient denies chest pain and shortness of breath.  Cath site is stable and swelling/hematoma has improved significantly from yesterday with return of sensation in all fingers.  Scheduled Meds:  aspirin   81 mg Oral Daily   atorvastatin   80 mg Oral Daily   Chlorhexidine  Gluconate Cloth  6 each Topical Daily   free water   500 mL Oral Once   levothyroxine   50 mcg Oral Q0600   prasugrel   10 mg Oral Daily   sodium chloride  flush  3 mL Intravenous Q12H   Continuous Infusions:  sodium chloride      PRN Meds: sodium chloride  flush, traMADol , zolpidem    Vital Signs  Vitals:   06/23/24 0800 06/23/24 0805 06/23/24 0810 06/23/24 0815  BP: (!) 77/60 (!) 80/64  (!) 80/66  Pulse: 69 66 65 71  Resp: 13 (!) 31 14 (!) 21  Temp:  97.6 F (36.4 C)    TempSrc:  Oral    SpO2: 96% 96% 95% 95%  Weight:      Height:        Intake/Output Summary (Last 24 hours) at 06/23/2024 0824 Last data filed at 06/22/2024 2200 Gross per 24 hour  Intake 600 ml  Output 1900 ml  Net -1300 ml      06/22/2024    6:00 AM 06/22/2024    4:11 AM 05/04/2024   11:08 AM  Last 3 Weights  Weight (lbs) 131 lb 13.4 oz 127 lb 123 lb 9.6 oz  Weight (kg) 59.8 kg 57.607 kg 56.065 kg      Telemetry Sinus rhythm- Personally Reviewed  Physical Exam  GEN: No acute distress.   Neck: No JVD Cardiac: RRR, no murmurs, rubs, or gallops. R radial cath site is stable with mild swelling of the hand and forearm. Respiratory: Clear to auscultation bilaterally. GI: Soft, nontender, non-distended  MS: No  edema; No deformity. Neuro:  Nonfocal  Psych: Normal affect   Labs High Sensitivity Troponin:   Recent Labs  Lab 06/22/24 0400 06/22/24 0636 06/22/24 0952  TROPONINIHS 5 1,427* 11,072*     Chemistry Recent Labs  Lab 06/22/24 0400 06/23/24 0327  NA 142 139  K 4.0 4.3  CL 110 105  CO2 25 25  GLUCOSE 136* 110*  BUN 19 17  CREATININE 0.98 1.00  CALCIUM  8.6* 8.6*  PROT 6.5  --   ALBUMIN 3.6  --   AST 20  --   ALT 13  --   ALKPHOS 120  --   BILITOT 0.6  --   GFRNONAA >60 >60  ANIONGAP 7 9    Lipids  Recent Labs  Lab 06/22/24 0400  CHOL 138  TRIG 70  HDL 66  LDLCALC 58  CHOLHDL 2.1    Hematology Recent Labs  Lab 06/22/24 0400 06/23/24 0327  WBC 7.9 7.9  RBC 3.60* 3.27*  HGB 11.9* 10.8*  HCT 36.6 32.6*  MCV 101.7* 99.7  MCH 33.1 33.0  MCHC 32.5 33.1  RDW 13.5 13.6  PLT 161 139*   Thyroid  No results for input(s): TSH, FREET4 in the last 168 hours.  BNPNo results  for input(s): BNP, PROBNP in the last 168 hours.  DDimer No results for input(s): DDIMER in the last 168 hours.   Radiology  DG Chest Port 1 View Result Date: 06/22/2024 CLINICAL DATA:  Mild cardial infarction. EXAM: PORTABLE CHEST 1 VIEW COMPARISON:  11/29/2023 FINDINGS: The cardiopericardial silhouette is within normal limits for size. Descending thoracic aortic stent device again noted. The lungs are clear without focal pneumonia, edema, pneumothorax or pleural effusion. Hiatal hernia seen previously less readily evident today. No acute bony abnormality. Telemetry leads overlie the chest. IMPRESSION: No active disease. Electronically Signed   By: Camellia Candle M.D.   On: 06/22/2024 05:00    Cardiac Studies  12/2022 Echo complete 1. Left ventricular ejection fraction, by estimation, is 60 to 65%. The  left ventricle has normal function. The left ventricle has no regional  wall motion abnormalities. Left ventricular diastolic parameters are  consistent with Grade I diastolic   dysfunction (impaired relaxation). The average left ventricular global  longitudinal strain is -18.7 %.   2. Right ventricular systolic function is normal. The right ventricular  size is normal. Tricuspid regurgitation signal is inadequate for assessing  PA pressure.   3. The mitral valve is normal in structure. Mild mitral valve  regurgitation. No evidence of mitral stenosis.   4. The aortic valve is tricuspid. Aortic valve regurgitation is not  visualized. Aortic valve sclerosis/calcification is present, without any  evidence of aortic stenosis.   5. The inferior vena cava is normal in size with greater than 50%  respiratory variability, suggesting right atrial pressure of 3 mmHg.   Patient Profile   73 y.o. female with history of CAD, hypertension, hyperlipidemia, thoracic abdominal aortic aneurysm s/p four-vessel for EVAR (CEA, SMA, RA, LRA) proximal T EVAR extension and bilateral iliac limb extension s/p endograft (11/2020), and ongoing tobacco use x 20+ years who presented to [facility] with bilateral upper extremity discomfort, numbness and tingling, found to have acute inferior STEMI.  Assessment & Plan   Acute STEMI Coronary artery disease - Presented to [facility] 9/15 with bilateral upper extremity discomfort, numbness and tingling, with EKG demonstrating inferior STEMI - Emergent cardiac catheterization revealed 99% ostial RCA lesion with successful DES placement - Echo pending - Continue DAPT with aspirin  and Effient  for at least 12 months - Continue atorvastatin  80 mg daily, increased from home dose of 40 mg daily - Right had with significant swelling and and hematoma yesterday which is significantly improved today.  Patient has sensation in all 5 fingers and can make a fist.  Right radial pulse is palpable. - BB held due to hypotension, see below  Hypertension - Blood pressure hypotensive this morning. Patient is asymptomatic.  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  Recommend checking blood pressures in the leg and intermittently in the right arm - Losartan  and metoprolol  on hold, consider resuming tomorrow - Given fluid bolus  Hyperlipidemia - LDL 58, at goal - Continue atorvastatin  80 mg daily  Tobacco abuse - Recommend cessation  For questions or updates, please contact [facility] Please consult [email or web address] for contact info under       Signed, Lesley LITTIE Maffucci, PA-C  06/23/2024, 8:24 AM

## 2024-06-23 NOTE — Hospital Course (Signed)
 73 y.o. female with medical history significant for HTN, HLD, hypothyroidism, COPD aneurysm s/p EVAR and GERD chest pain, diaphoresis and some shortness of breath started last night around 2:15 AM.  Her chest pain was pressure-like radiating to both arms also with some numbness.  She was taken to cardiac catheterization lab and underwent PCI and stent.  Hospitalist service was consulted for evaluation for admission post cath hospitalist management.   ED Course: Upon arrival to the ED, patient is found to be hypotensive around 70/47, bradycardic around 55 ST elevation in inferior leads with reciprocal depression in aVL extending to V2 concerning for inferior infarct with RV extension.  Code STEMI was activated in the emergency room.  Chest x-ray with out acute pathology.  Patient was started on heparin  drip, IV fluid and aspirin .  Patient was taken to cardiac Cath Lab.  She is now s/p cath PCI and stent ostial RCA DES continue prasugrel  and aspirin  and statin.  Stay on in the hospital for next 24 to 48 hours.  Echocardiogram.  Will transfer cardiology care to [facility] who is her primary cardiologist.  9/16.  Patient feels okay.  No chest pain or shortness of breath.  Had bilateral numbness in arms and sweating on presentation.  Found to have a STEMI.  Stent in the RCA.  This morning hypotensive.  Need to hold Toprol  and losartan  for now.  Fluid bolus given.

## 2024-06-23 NOTE — Assessment & Plan Note (Signed)
 Stop smoking

## 2024-06-23 NOTE — Progress Notes (Signed)
*  PRELIMINARY RESULTS* Echocardiogram 2D Echocardiogram has been performed.  Floydene Harder 06/23/2024, 11:22 AM

## 2024-06-23 NOTE — Assessment & Plan Note (Signed)
 Blood pressure on the left arm lower than the right arm.  May have an element of subclavian stenosis.  Unable to take blood pressure in the right arm with the right arm hematoma.  Continue to monitor.

## 2024-06-23 NOTE — Assessment & Plan Note (Addendum)
 Right arm.  Continue to monitor

## 2024-06-23 NOTE — Progress Notes (Signed)
*  PRELIMINARY RESULTS* Echocardiogram 2D Echocardiogram has been performed.  Floydene Harder 06/23/2024, 11:21 AM

## 2024-06-23 NOTE — Progress Notes (Signed)
 Progress Note   Patient: Joy Carpenter FMW:[id number] DOB: Feb 14, 1951 DOA: 06/22/2024     1 DOS: the patient was seen and examined on 06/23/2024   Brief hospital course: 73 y.o. female with medical history significant for HTN, HLD, hypothyroidism, COPD aneurysm s/p EVAR and GERD chest pain, diaphoresis and some shortness of breath started last night around 2:15 AM.  Her chest pain was pressure-like radiating to both arms also with some numbness.  She was taken to cardiac catheterization lab and underwent PCI and stent.  Hospitalist service was consulted for evaluation for admission post cath hospitalist management.   ED Course: Upon arrival to the ED, patient is found to be hypotensive around 70/47, bradycardic around 55 ST elevation in inferior leads with reciprocal depression in aVL extending to V2 concerning for inferior infarct with RV extension.  Code STEMI was activated in the emergency room.  Chest x-ray with out acute pathology.  Patient was started on heparin  drip, IV fluid and aspirin .  Patient was taken to cardiac Cath Lab.  She is now s/p cath PCI and stent ostial RCA DES continue prasugrel  and aspirin  and statin.  Stay on in the hospital for next 24 to 48 hours.  Echocardiogram.  Will transfer cardiology care to [facility] who is her primary cardiologist.  9/16.  Patient feels okay.  No chest pain or shortness of breath.  Had bilateral numbness in arms and sweating on presentation.  Found to have a STEMI.  Stent in the RCA.  This morning hypotensive.  Need to hold Toprol  and losartan  for now.  Fluid bolus given.  Assessment and Plan: * STEMI involving right coronary artery (HCC) Stent to the right RCA.  Cardiology following.  On aspirin  and Effient  and Lipitor .  Holding Toprol  currently with hypotension  Hypotension Blood pressure on the left arm lower than the right arm.  May have an element of subclavian stenosis.  Unable to take blood pressure in the right arm with the right arm  hematoma.  Continue to monitor.  Thrombocytopenia (HCC) She has had borderline platelet counts in the past.  Continue to monitor  Hematoma Right arm.  Continue to monitor  PVD (peripheral vascular disease) (HCC) History of AAA repair  Hyperlipidemia LDL 58.  Continue high intensity statin  Tobacco use disorder Stop smoking        Subjective: Called secondary to low blood pressure.  Patient feels okay and offers no complaints.  Given fluid bolus.  Holding Toprol  and losartan  for now.  Physical Exam: Vitals:   06/23/24 0800 06/23/24 0805 06/23/24 0810 06/23/24 0815  BP: (!) 77/60 (!) 80/64  (!) 80/66  Pulse: 69 66 65 71  Resp: 13 (!) 31 14 (!) 21  Temp:  97.6 F (36.4 C)    TempSrc:  Oral    SpO2: 96% 96% 95% 95%  Weight:      Height:       Physical Exam HENT:     Head: Normocephalic.  Eyes:     General: Lids are normal.     Conjunctiva/sclera: Conjunctivae normal.  Cardiovascular:     Rate and Rhythm: Normal rate and regular rhythm.     Heart sounds: Normal heart sounds, S1 normal and S2 normal.  Pulmonary:     Breath sounds: No decreased breath sounds, wheezing, rhonchi or rales.  Abdominal:     Palpations: Abdomen is soft.     Tenderness: There is no abdominal tenderness.  Musculoskeletal:     Right lower leg:  No swelling.     Left lower leg: No swelling.  Skin:    General: Skin is warm.     Comments: Hematoma right arm  Neurological:     Mental Status: She is alert and oriented to person, place, and time.     Data Reviewed: LDL 58, creatinine 1, troponin peak 11,072, white blood cell count 7.9, hemoglobin 10.8, platelet count 139  Disposition: Status is: Inpatient With hypotension today gave a fluid bolus.  Holding Toprol  and losartan .  Continue to monitor further  Planned Discharge Destination: Home    Time spent: 28 minutes Case discussed with cardiology  Author: Charlie Patterson, MD 06/23/2024 12:13 PM  For on call review  [email or web address].

## 2024-06-23 NOTE — Assessment & Plan Note (Signed)
 She has had borderline platelet counts in the past.  Continue to monitor

## 2024-06-23 NOTE — Plan of Care (Signed)
  Problem: Education: Goal: Knowledge of General Education information will improve Description: Including pain rating scale, medication(s)/side effects and non-pharmacologic comfort measures Outcome: Progressing   Problem: Clinical Measurements: Goal: Diagnostic test results will improve Outcome: Progressing Goal: Cardiovascular complication will be avoided Outcome: Progressing   Problem: Cardiovascular: Goal: Vascular access site(s) Level 0-1 will be maintained Outcome: Progressing

## 2024-06-23 NOTE — Assessment & Plan Note (Signed)
 History of AAA repair

## 2024-06-23 NOTE — Progress Notes (Signed)
 The patient has been transferred to 2A. ICU charge nurse has called report to 2A's nurse.

## 2024-06-23 NOTE — Progress Notes (Signed)
 MD has been notified about the patient having soft BP readings overnight. Monitor vs manual BP have been obtained as shown below.   06/23/24 0805 06/23/24 0810 06/23/24 0815  Vitals  Temp 97.6 F (36.4 C)  --   --   Temp Source Oral  --   --   BP (!) 80/64  --  (!) 80/66  MAP (mmHg) 70  --   --   BP Location Left Arm  --  Left Arm  BP Method Automatic  --  Manual  Patient Position (if appropriate) Lying  --  Lying  Pulse Rate 66 65 71  Pulse Rate Source Monitor  --  Monitor  ECG Heart Rate 66 64 70  Resp (!) 31 14 (!) 21  MEWS COLOR  MEWS Score Color  --  Yellow Yellow  Oxygen Therapy  SpO2 96 % 95 % 95 %  O2 Device  --   --  Room [facility]

## 2024-06-23 NOTE — Assessment & Plan Note (Signed)
 LDL 58.  Continue high intensity statin

## 2024-06-23 NOTE — Plan of Care (Signed)

## 2024-06-23 NOTE — Assessment & Plan Note (Signed)
 Stent to the right RCA.  Cardiology following.  On aspirin  and Effient  and Lipitor .  Holding Toprol  currently with hypotension

## 2024-06-24 ENCOUNTER — Encounter: Payer: Self-pay | Admitting: Internal Medicine

## 2024-06-24 ENCOUNTER — Other Ambulatory Visit: Payer: Self-pay

## 2024-06-24 DIAGNOSIS — E785 Hyperlipidemia, unspecified: Secondary | ICD-10-CM | POA: Diagnosis not present

## 2024-06-24 DIAGNOSIS — I10 Essential (primary) hypertension: Secondary | ICD-10-CM | POA: Diagnosis not present

## 2024-06-24 DIAGNOSIS — I2111 ST elevation (STEMI) myocardial infarction involving right coronary artery: Secondary | ICD-10-CM | POA: Diagnosis not present

## 2024-06-24 DIAGNOSIS — F17200 Nicotine dependence, unspecified, uncomplicated: Secondary | ICD-10-CM | POA: Diagnosis not present

## 2024-06-24 LAB — CBC
HCT: 32.3 % — ABNORMAL LOW (ref 36.0–46.0)
Hemoglobin: 10.6 g/dL — ABNORMAL LOW (ref 12.0–15.0)
MCH: 33.1 pg (ref 26.0–34.0)
MCHC: 32.8 g/dL (ref 30.0–36.0)
MCV: 100.9 fL — ABNORMAL HIGH (ref 80.0–100.0)
Platelets: 136 K/uL — ABNORMAL LOW (ref 150–400)
RBC: 3.2 MIL/uL — ABNORMAL LOW (ref 3.87–5.11)
RDW: 13.2 % (ref 11.5–15.5)
WBC: 6.4 K/uL (ref 4.0–10.5)
nRBC: 0 % (ref 0.0–0.2)

## 2024-06-24 LAB — BASIC METABOLIC PANEL WITH GFR
Anion gap: 8 (ref 5–15)
BUN: 16 mg/dL (ref 8–23)
CO2: 27 mmol/L (ref 22–32)
Calcium: 8.8 mg/dL — ABNORMAL LOW (ref 8.9–10.3)
Chloride: 103 mmol/L (ref 98–111)
Creatinine, Ser: 1.03 mg/dL — ABNORMAL HIGH (ref 0.44–1.00)
GFR, Estimated: 58 mL/min — ABNORMAL LOW (ref >60)
Glucose, Bld: 93 mg/dL (ref 70–99)
Potassium: 4.1 mmol/L (ref 3.5–5.1)
Sodium: 138 mmol/L (ref 135–145)

## 2024-06-24 LAB — LIPOPROTEIN A (LPA): Lipoprotein (a): 41.9 nmol/L — ABNORMAL HIGH (ref <75.0)

## 2024-06-24 LAB — CARDIAC CATHETERIZATION: Cath EF Quantitative: 60 %

## 2024-06-24 MED ORDER — PRASUGREL HCL 10 MG PO TABS
5.0000 mg | ORAL_TABLET | Freq: Every day | ORAL | 2 refills | Status: AC
  Filled 2024-06-24: qty 30, 60d supply, fill #0

## 2024-06-24 MED ORDER — OXYCODONE HCL 5 MG PO TABS
5.0000 mg | ORAL_TABLET | Freq: Four times a day (QID) | ORAL | 0 refills | Status: DC | PRN
  Filled 2024-06-24: qty 30, 8d supply, fill #0

## 2024-06-24 NOTE — TOC Transition Note (Signed)
 Transition of Care [facility] - Discharge Note   Patient Details  Name: Joy Carpenter MRN: [id number] Date of Birth: [birth year]  Transition of Care [facility]) CM/SW Contact:  Lauraine [name], LCSW Phone Number: 06/24/2024, 10:19 AM   Clinical Narrative:   Patient has orders to discharge home today. SDOH flag for housing. Added resources to AVS and asked RN to reprint if patient is still here. No further concerns. CSW signing off.  Final next level of care: Home/Self Care Barriers to Discharge: Barriers Resolved   Patient Goals and CMS Choice            Discharge Placement                    Patient and family notified of of transfer: 06/24/24  Discharge Plan and Services Additional resources added to the After Visit Summary for                                       Social Drivers of Health (SDOH) Interventions SDOH Screenings   Food Insecurity: No Food Insecurity (06/22/2024)  Housing: High Risk (06/22/2024)  Transportation Needs: No Transportation Needs (06/22/2024)  Utilities: Not At Risk (06/22/2024)  Alcohol Screen: Low Risk  (04/22/2023)  Depression (PHQ2-9): Low Risk  (08/01/2023)  Financial Resource Strain: Low Risk  (11/29/2023)   Received from [facility]  Social Connections: Unknown (06/22/2024)  Tobacco Use: High Risk (06/22/2024)     Readmission Risk Interventions     No data to display

## 2024-06-24 NOTE — Discharge Instructions (Signed)
 Rent/Utility/Housing  Agency Name: [facility] Agency Address: [address] [place], [place] [address] Phone: [phone] Email: [email or web address] Website: [email or web address] Service(s) Offered: Housing services, self-sufficiency, congregate meal program, weatherization program, Field seismologist program, emergency food assistance,  housing counseling, home ownership program, wheels -towork program.  Agency Name: [facility] Address: [address] [place], [place] [address] Phone: [phone] (8a-4p) [phone] (8p- 10p) Email: [email or web address] Website: [email or web address] Service(s) Offered: A program for homeless and/or needy men that includes one-on-one counseling, life skills training and job rehabilitation.  Agency Name: [facility] [place] Address: [address] [place], [place] [address] Phone: [phone] Website: www.[email or web address].org Service(s) Offered: Assistance to needy in emergency with utility bills, heating fuel, and prescriptions. Shelter for homeless 7pm-7am. January 31, 2017 15  Agency Name: [facility] [place] [facility] Address: [address], [place], [place] [address] Phone: [phone] Contact Person: Cathleen Corti Email: [email or web address] Website: [email or web address].[email or web address] Service(s) Offered: Helps individuals with developmental disabilities move from housing that is more restrictive to homes where they  can achieve greater independence and have more  opportunities.  Agency Name: [facility] Address: [address], [place], [place] [address] Phone: [phone] Email: [email or web address] Website: www.[email or web address].org Service(s) Offered: Provides affordable housing for low-income families, elderly, and disabled individuals. Offer a wide range of  programs and services, from financial planning to  afterschool and summer programs.  Agency Name: [facility] Address: [address] [place], [place] [address] Phone: [phone] Service(s) Offered: Child support services; child welfare services; food stamps; Medicaid; work first family assistance; and aid with fuel,  rent, food and medicine.  Agency Name: [facility] [place] [employer] Address: [facility] [address], [place], [place]  [address] Phone: [phone] Website: www.familyabuseservices.org Service(s) Offered: 24 hour Crisis Line: [phone]; 24 hour Emergency Shelter; Transitional Housing; Support Groups; Scientist, physiological; [employer]; Hispanic Outreach: [phone];  Visitation Center: [phone].  Agency Name: [facility] [place]. Address: [address] [place], [place] [address] Phone: [phone] Service(s) Offered: CAP Services; Home and [employer]; Individual or Group Supports; Respite Care Non-Institutional Nursing;  Residential Supports; Respite Care and Personal Care Services; Transportation; Family and Friends Night; Recreational Activities; Three Nutritious Meals/Snacks; Consultation with Registered Dietician; Twenty-four hour Registered Nurse Access; Daily and [employer]; Camp [employer] Leaves; [place] for the [employer] (During Summer Months) Bingo Night (Every  Wednesday Night); Special Populations Dance Night  (Every Tuesday Night); Professional Hair Care Services.  Agency Name: [facility] Address: [address], [place], [place] [address] Phone: [phone] Contact Person: Jabier Mutton Website: [email or web address].Physicist, medical) Offered: Residential treatment facility for women; food and  clothing, educational & employment development and  transportation to work; Counsellor of financial skills;  parenting and family reunification; emotional and spiritual  support;  transitional housing for program graduates.  Agency Name: [facility] Address: [address] [place], [place] [address] Phone: [phone] Email: [email or web address] Website: [email or web address].es Service(s) Offered: Public housing units for elderly, disabled, and low income people; housing choice vouchers for income eligible  applicants; shelter plus care vouchers; and Psychologist, clinical.  Agency Name: [facility] [employer] Address: [address] [place], [place] [address] Phone: [phone] Email: [email or web address] Website: www.habitatalamance.org Service(s) Offered: Build houses for families in need of decent housing. Each adult in the family must invest 200 hours of labor on  someone else's house, work with volunteers to build their own house, attend classes  on budgeting, home maintenance, yard care, and attend homeowner association meetings.  Agency Name: [facility] Address: [address], [place], [place] [address] Phone: [phone] Website: www.rsli.org Service(s) Offered: Intermediate care facilities for intellectually delayed, Supervised Living in group homes for adults with developmental disabilities, Supervised Living for people who have dual diagnoses (MRMI), Independent Living, Supported Living, respite and a variety of CAP services, pre-vocational services, day supports, and [employer].  Agency Name: [place] [employer] Phone: [phone] Website: www.NCForeclosurePrevention.gov Service(s) Offered: Zero-interest, deferred loans to homeowners struggling to pay their mortgage. Call for more information.

## 2024-06-24 NOTE — Progress Notes (Signed)
 Rounding Note   Patient Name: Joy Carpenter Date of Encounter: 06/24/2024  [facility] Cardiologist: Redell Cave, MD   Subjective Blood pressure stable when measured in the leg and right arm. She has been ambulating to the bathroom without issue. Denies chest pain and shortness of breath. Had a long discussion regarding smoking cessation.   Scheduled Meds:  aspirin   81 mg Oral Daily   atorvastatin   80 mg Oral Daily   Chlorhexidine  Gluconate Cloth  6 each Topical Daily   free water   500 mL Oral Once   levothyroxine   50 mcg Oral Q0600   prasugrel   5 mg Oral Daily   sodium chloride  flush  3 mL Intravenous Q12H   Continuous Infusions:   PRN Meds: oxyCODONE , sodium chloride  flush, zolpidem    Vital Signs  Vitals:   06/23/24 1846 06/23/24 2031 06/23/24 2343 06/24/24 0446  BP: 100/65 126/72 120/60 (!) 101/49  Pulse: 66 67 71 65  Resp: 18 20 20 19   Temp: 98.4 F (36.9 C) 98.2 F (36.8 C) 98.1 F (36.7 C) 98.6 F (37 C)  TempSrc:      SpO2: 97% 99% 97% 95%  Weight:      Height:        Intake/Output Summary (Last 24 hours) at 06/24/2024 0807 Last data filed at 06/23/2024 2220 Gross per 24 hour  Intake 393 ml  Output --  Net 393 ml      06/22/2024    6:00 AM 06/22/2024    4:11 AM 05/04/2024   11:08 AM  Last 3 Weights  Weight (lbs) 131 lb 13.4 oz 127 lb 123 lb 9.6 oz  Weight (kg) 59.8 kg 57.607 kg 56.065 kg      Telemetry Sinus rhythm- Personally Reviewed  Physical Exam  GEN: No acute distress.   Neck: No JVD Cardiac: RRR, no murmurs, rubs, or gallops. R radial cath site is stable with mild swelling of the hand and forearm. Respiratory: Clear to auscultation bilaterally. GI: Soft, nontender, non-distended  MS: No edema; No deformity. Neuro:  Nonfocal  Psych: Normal affect   Labs High Sensitivity Troponin:   Recent Labs  Lab 06/22/24 0400 06/22/24 0636 06/22/24 0952 06/23/24 0605  TROPONINIHS 5 1,427* 11,072* 4,380*      Chemistry Recent Labs  Lab 06/22/24 0400 06/23/24 0327 06/24/24 0526  NA 142 139 138  K 4.0 4.3 4.1  CL 110 105 103  CO2 25 25 27   GLUCOSE 136* 110* 93  BUN 19 17 16   CREATININE 0.98 1.00 1.03*  CALCIUM  8.6* 8.6* 8.8*  PROT 6.5  --   --   ALBUMIN 3.6  --   --   AST 20  --   --   ALT 13  --   --   ALKPHOS 120  --   --   BILITOT 0.6  --   --   GFRNONAA >60 >60 58*  ANIONGAP 7 9 8     Lipids  Recent Labs  Lab 06/22/24 0400  CHOL 138  TRIG 70  HDL 66  LDLCALC 58  CHOLHDL 2.1    Hematology Recent Labs  Lab 06/22/24 0400 06/23/24 0327 06/24/24 0526  WBC 7.9 7.9 6.4  RBC 3.60* 3.27* 3.20*  HGB 11.9* 10.8* 10.6*  HCT 36.6 32.6* 32.3*  MCV 101.7* 99.7 100.9*  MCH 33.1 33.0 33.1  MCHC 32.5 33.1 32.8  RDW 13.5 13.6 13.2  PLT 161 139* 136*   Thyroid  No results for input(s): TSH, FREET4 in the  last 168 hours.  BNPNo results for input(s): BNP, PROBNP in the last 168 hours.  DDimer No results for input(s): DDIMER in the last 168 hours.     Cardiac Studies  06/23/2024 Echo complete 1. Left ventricular ejection fraction, by estimation, is 55 to 60%. The  left ventricle has normal function. The left ventricle demonstrates  regional wall motion abnormalities (see scoring diagram/findings for  description). There is mild asymmetric left  ventricular hypertrophy of the basal-septal segment. Left ventricular  diastolic parameters are consistent with Grade I diastolic dysfunction  (impaired relaxation). The average left ventricular global longitudinal  strain is -16.9 %. The global  longitudinal strain is normal.   2. Right ventricular systolic function is normal. The right ventricular  size is normal. Tricuspid regurgitation signal is inadequate for assessing  PA pressure.   3. The mitral valve is normal in structure. Trivial mitral valve  regurgitation. No evidence of mitral stenosis.   4. The aortic valve has an indeterminant number of cusps. There is mild   calcification of the aortic valve. There is mild thickening of the aortic  valve. Aortic valve regurgitation is not visualized. Aortic valve  sclerosis/calcification is present,  without any evidence of aortic stenosis.   5. The inferior vena cava is normal in size with <50% respiratory  variability, suggesting right atrial pressure of 8 mmHg.   06/22/2024 LHC   Ost RCA to Prox RCA lesion is 99% stenosed.   Prox LAD lesion is 50% stenosed.   Mid LM to Dist LM lesion is 25% stenosed.   The left ventricular systolic function is normal.   LV end diastolic pressure is normal.   The left ventricular ejection fraction is 55-65% by visual estimate.   Recommend uninterrupted dual antiplatelet therapy with Aspirin  81mg  daily and Prasugrel  10mg  daily for a minimum of 12 months (ACS-Class I recommendation).   STEMI inferior Right radial approach Normal left ventricular function EF of 60% normal wall motion   Coronaries Left main medium minor disease mild calcification LAD medium in size minor irregularities moderate proximal calcification Ramus medium minor irregularities Circumflex medium size minor irregularities RCA large ostial 95% stenosis IRA thrombotic ulcerated TIMI-3 flow   Intervention PCI and stent DES ostial RCA 3 oh x 12 mm frontier Onyx Postdilated with a 3.5 x 8 mm [place] neo-18 atm Lesion reduced from 99 x 0% TIMI-3 flow maintained throughout Patient placed on aspirin  prasugrel  for 12 months  Patient Profile   73 y.o. 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  Assessment & Plan   Acute STEMI Coronary artery disease - Presented to [facility]  with bilateral upper extremity discomfort, numbness and tingling, with EKG demonstrating inferior STEMI - Emergent cardiac catheterization revealed 99% ostial RCA lesion with successful DES placement - Echo showed normal LV systolic function - Continue DAPT with aspirin  and Effient  for at least 12 months - Continue atorvastatin  80 mg daily, increased from home dose of 40 mg daily - Cath site is stable with mild swelling and ecchymosis  - BB held due to hypotension, see below  Hypertension - Blood pressure hypotensive yesterday morning. Patient asymptomatic.  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 - BP significantly  improved when checked in right arm and leg, 100-120 mmHg systolic - Will continue to hold PTA antihypertensives. Directed patient to check pressures at home with consideration for resuming as outpatient.   Hyperlipidemia - LDL 58, at goal - Continue atorvastatin  80 mg daily  Tobacco abuse - Recommend cessation. Patient is agreeable but declines assistance from [facility] at this time.   For questions or updates, please contact [facility] Please consult [email or web address] for contact info under       Signed, Lesley LITTIE Maffucci, PA-C  06/24/2024, 8:07 AM

## 2024-06-24 NOTE — Plan of Care (Signed)
  Problem: Clinical Measurements: Goal: Ability to maintain clinical measurements within normal limits will improve Outcome: Progressing Goal: Diagnostic test results will improve Outcome: Progressing Goal: Cardiovascular complication will be avoided Outcome: Progressing   Problem: Activity: Goal: Risk for activity intolerance will decrease Outcome: Progressing   Problem: Nutrition: Goal: Adequate nutrition will be maintained Outcome: Progressing   Problem: Coping: Goal: Level of anxiety will decrease Outcome: Progressing   Problem: Pain Managment: Goal: General experience of comfort will improve and/or be controlled Outcome: Progressing   Problem: Safety: Goal: Ability to remain free from injury will improve Outcome: Progressing

## 2024-06-24 NOTE — Discharge Summary (Signed)
 Physician Discharge Summary   Patient: Joy Carpenter MRN: [id number] DOB: 04/08/1951  Admit date:     06/22/2024  Discharge date: 06/24/2024  Discharge Physician: Burnard DELENA Cunning   PCP: Kristina Tinnie POUR, PA-C   Recommendations at discharge:    Follow up with Cardiology as scheduled Follow up with PCP in 1-2 weeks Repeat CBC, CMP, Mg at follow up  Discharge Diagnoses: Principal Problem:   STEMI involving right coronary artery (HCC) Active Problems:   Hypotension   Tobacco use disorder   Hyperlipidemia   PVD (peripheral vascular disease) (HCC)   Hematoma   Thrombocytopenia (HCC)  Resolved Problems:   * No resolved hospital problems. *  Hospital Course:  73 y.o. female with medical history significant for HTN, HLD, hypothyroidism, COPD aneurysm s/p EVAR and GERD chest pain, diaphoresis and some shortness of breath started last night around 2:15 AM.  Her chest pain was pressure-like radiating to both arms also with some numbness.  She was taken to cardiac catheterization lab and underwent PCI and stent.  Hospitalist service was consulted for evaluation for admission post cath hospitalist management.   ED Course: Upon arrival to the ED, patient is found to be hypotensive around 70/47, bradycardic around 55 ST elevation in inferior leads with reciprocal depression in aVL extending to V2 concerning for inferior infarct with RV extension.  Code STEMI was activated in the emergency room.  Chest x-ray with out acute pathology.  Patient was started on heparin  drip, IV fluid and aspirin .  Patient was taken to cardiac Cath Lab.  She is now s/p cath PCI and stent ostial RCA DES continue prasugrel  and aspirin  and statin.  Stay on in the hospital for next 24 to 48 hours.  Echocardiogram.  Will transfer cardi[facility] to [facility] who is her primary cardiologist.   9/16.  Patient feels okay.  No chest pain or shortness of breath.  Had bilateral numbness in arms and sweating on presentation.   Found to have a STEMI.  Stent in the RCA.  This morning hypotensive.  Need to hold Toprol  and losartan  for now.  Fluid bolus given.  9/17 -- I assumed care.  Pt doing well, has no acute complaints, no chest pain. Pt was cleared by Cardiology and is medically stable and agreeable for discharge home today.   Assessment and Plan:  *STEMI involving right coronary artery (HCC) Stent to the right RCA.  Cardiology following.   On aspirin  and Effient  and Lipitor .   Holding Toprol  currently with hypotension Follow up with Cardiology   Hypotension Blood pressure on the left arm lower than the right arm.   May have an element of subclavian stenosis.   Unable to take blood pressure in the right arm with the right arm hematoma.   Continue to monitor in follow up.   Thrombocytopenia (HCC) She has had borderline platelet counts in the past.   CBC at follow up   Hematoma Right arm.  Continue to monitor   PVD (peripheral vascular disease) (HCC) History of AAA repair   Hyperlipidemia LDL 58.  Continue high intensity statin   Tobacco use disorder Stop smoking       Consultants: Cardiology Procedures performed: cardiac cath, ehco  Disposition: Home Diet recommendation:  Cardiac diet DISCHARGE MEDICATION: Allergies as of 06/24/2024   No Known Allergies      Medication List     STOP taking these medications    amLODipine  2.5 MG tablet Commonly known as: NORVASC    losartan   25 MG tablet Commonly known as: COZAAR        TAKE these medications    ALPRAZolam  0.25 MG tablet Commonly known as: XANAX  Take 1 tablet by mouth as needed for anxiety   aspirin  81 MG chewable tablet Chew 81 mg by mouth daily.   atorvastatin  40 MG tablet Commonly known as: LIPITOR  TAKE 1 TABLET BY MOUTH EVERY DAY   levothyroxine  50 MCG tablet Commonly known as: SYNTHROID  TAKE 1 TABLET BY MOUTH EVERY DAY IN THE MORNING   prasugrel  5 MG Tabs tablet Commonly known as: EFFIENT  Take 1 tablet  (5 mg total) by mouth daily.   traMADol  50 MG tablet Commonly known as: ULTRAM  TAKE 1/2 TO 1 TABLET BY MOUTH DAILY AS NEEDED FOR PAIN   valACYclovir  500 MG tablet Commonly known as: VALTREX  TAKE 1 TABLET (500 MG TOTAL) BY MOUTH DAILY. What changed:  when to take this reasons to take this   zolpidem  10 MG tablet Commonly known as: AMBIEN  TAKE 1 TABLET BY MOUTH EVERYDAY AT BEDTIME        Discharge Exam: Filed Weights   06/22/24 0411 06/22/24 0600  Weight: 57.6 kg 59.8 kg   General exam: awake, alert, no acute distress, chronically ill appearing HEENT: moist mucus membranes, hearing grossly normal  Respiratory system: CTAB, no wheezes, rales or rhonchi, normal respiratory effort. Cardiovascular system: normal S1/S2,  RRR, no pedal edema.   Gastrointestinal system: soft, NT, ND, no HSM felt, +bowel sounds. Central nervous system: A&O x3. no gross focal neurologic deficits, normal speech Extremities: moves all , no edema, normal tone Skin: dry, intact, normal temperature, normal color, No rashes, lesions or ulcers Psychiatry: normal mood, congruent affect, judgement and insight appear normal   Condition at discharge: stable  The results of significant diagnostics from this hospitalization (including imaging, microbiology, ancillary and laboratory) are listed below for reference.   Imaging Studies: CARDIAC CATHETERIZATION Result Date: 06/24/2024   Ost RCA to Prox RCA lesion is 99% stenosed.   Prox LAD lesion is 50% stenosed.   Mid LM to Dist LM lesion is 25% stenosed.   The left ventricular systolic function is normal.   LV end diastolic pressure is normal.   The left ventricular ejection fraction is 55-65% by visual estimate.   Recommend uninterrupted dual antiplatelet therapy with Aspirin  81mg  daily and Prasugrel  10mg  daily for a minimum of 12 months (ACS-Class I recommendation). STEMI inferior Right radial approach Normal left ventricular function EF of 60% normal wall motion  Coronaries Left main medium minor disease mild calcification LAD medium in size minor irregularities moderate proximal calcification Ramus medium minor irregularities Circumflex medium size minor irregularities RCA large ostial 95% stenosis IRA thrombotic ulcerated TIMI-3 flow Intervention PCI and stent DES ostial RCA 3 oh x 12 mm frontier Onyx Postdilated with a 3.5 x 8 mm [place] neo-18 atm Lesion reduced from 99 x 0% TIMI-3 flow maintained throughout Patient placed on aspirin  prasugrel  for 12 months Patient tolerated procedure well No complication   ECHOCARDIOGRAM COMPLETE Result Date: 06/23/2024    ECHOCARDIOGRAM REPORT   Patient Name:   MADGIE DHALIWAL Date of Exam: 06/23/2024 Medical Rec #:  [id number]              Height:       67.0 in Accession #:    [id number]             Weight:       131.8 lb Date of Birth:  01-11-1951  BSA:          1.694 m Patient Age:    73 years               BP:           103/66 mmHg Patient Gender: F                      HR:           61 bpm. Exam Location:  [facility] Procedure: 2D Echo, Cardiac Doppler, Color Doppler, 3D Echo and Strain Analysis            (Both Spectral and Color Flow Doppler were utilized during            procedure). Indications:     Acute myocardial Infarction -unspecified I21.9  History:         Patient has prior history of Echocardiogram examinations, most                  recent 12/26/2022. Risk Factors:Hypertension.  Sonographer:     Christopher Furnace Referring Phys:  [id number] DWAYNE D [facility] Diagnosing Phys: Lonni Hanson MD  Sonographer Comments: Global longitudinal strain was attempted. IMPRESSIONS  1. Left ventricular ejection fraction, by estimation, is 55 to 60%. The left ventricle has normal function. The left ventricle demonstrates regional wall motion abnormalities (see scoring diagram/findings for description). There is mild asymmetric left ventricular hypertrophy of the basal-septal segment. Left ventricular diastolic parameters are  consistent with Grade I diastolic dysfunction (impaired relaxation). The average left ventricular global longitudinal strain is -16.9 %. The global longitudinal strain is normal.  2. Right ventricular systolic function is normal. The right ventricular size is normal. Tricuspid regurgitation signal is inadequate for assessing PA pressure.  3. The mitral valve is normal in structure. Trivial mitral valve regurgitation. No evidence of mitral stenosis.  4. The aortic valve has an indeterminant number of cusps. There is mild calcification of the aortic valve. There is mild thickening of the aortic valve. Aortic valve regurgitation is not visualized. Aortic valve sclerosis/calcification is present, without any evidence of aortic stenosis.  5. The inferior vena cava is normal in size with <50% respiratory variability, suggesting right atrial pressure of 8 mmHg. FINDINGS  Left Ventricle: Left ventricular ejection fraction, by estimation, is 55 to 60%. The left ventricle has normal function. The left ventricle demonstrates regional wall motion abnormalities. The average left ventricular global longitudinal strain is -16.9  %. Strain was performed and the global longitudinal strain is normal. 3D ejection fraction reviewed and evaluated as part of the interpretation. Alternate measurement of EF is felt to be most reflective of LV function. The left ventricular internal cavity size was normal in size. There is mild asymmetric left ventricular hypertrophy of the basal-septal segment. Left ventricular diastolic parameters are consistent with Grade I diastolic dysfunction (impaired relaxation).  LV Wall Scoring: The basal inferolateral segment and basal inferior segment are hypokinetic. The entire anterior wall, antero-lateral wall, mid and distal lateral wall, entire septum, entire apex, and mid and distal inferior wall are normal. Right Ventricle: The right ventricular size is normal. No increase in right ventricular wall  thickness. Right ventricular systolic function is normal. Tricuspid regurgitation signal is inadequate for assessing PA pressure. Left Atrium: Left atrial size was normal in size. Right Atrium: Right atrial size was normal in size. Pericardium: Trivial pericardial effusion is present. Mitral Valve: The mitral valve is normal in structure. Trivial mitral valve regurgitation. No  evidence of mitral valve stenosis. MV peak gradient, 7.0 mmHg. The mean mitral valve gradient is 2.0 mmHg. Tricuspid Valve: The tricuspid valve is not well visualized. Tricuspid valve regurgitation is trivial. Aortic Valve: The aortic valve has an indeterminant number of cusps. There is mild calcification of the aortic valve. There is mild thickening of the aortic valve. Aortic valve regurgitation is not visualized. Aortic valve sclerosis/calcification is present, without any evidence of aortic stenosis. Aortic valve mean gradient measures 3.0 mmHg. Aortic valve peak gradient measures 5.0 mmHg. Aortic valve area, by VTI measures 2.32 cm. Pulmonic Valve: The pulmonic valve was not well visualized. Pulmonic valve regurgitation is not visualized. No evidence of pulmonic stenosis. Aorta: The aortic root is normal in size and structure. Pulmonary Artery: The pulmonary artery is of normal size. Venous: The inferior vena cava is normal in size with less than 50% respiratory variability, suggesting right atrial pressure of 8 mmHg. IAS/Shunts: No atrial level shunt detected by color flow Doppler.  LEFT VENTRICLE PLAX 2D LVIDd:         3.50 cm   Diastology LVIDs:         2.50 cm   LV e' medial:    5.11 cm/s LV PW:         0.70 cm   LV E/e' medial:  13.2 LV IVS:        1.07 cm   LV e' lateral:   7.72 cm/s LVOT diam:     2.00 cm   LV E/e' lateral: 8.8 LV SV:         57 LV SV Index:   34        2D Longitudinal Strain LVOT Area:     3.14 cm  2D Strain GLS Avg:     -16.9 %  RIGHT VENTRICLE RV Basal diam:  2.30 cm RV Mid diam:    1.90 cm LEFT ATRIUM              Index        RIGHT ATRIUM          Index LA diam:        1.80 cm 1.06 cm/m   RA Area:     8.11 cm LA Vol (A2C):   25.0 ml 14.76 ml/m  RA Volume:   14.70 ml 8.68 ml/m LA Vol (A4C):   20.5 ml 12.10 ml/m LA Biplane Vol: 22.7 ml 13.40 ml/m  AORTIC VALVE AV Area (Vmax):    2.40 cm AV Area (Vmean):   2.07 cm AV Area (VTI):     2.32 cm AV Vmax:           112.00 cm/s AV Vmean:          75.700 cm/s AV VTI:            0.248 m AV Peak Grad:      5.0 mmHg AV Mean Grad:      3.0 mmHg LVOT Vmax:         85.40 cm/s LVOT Vmean:        49.900 cm/s LVOT VTI:          0.183 m LVOT/AV VTI ratio: 0.74  AORTA Ao Root diam: 2.60 cm MITRAL VALVE MV Area (PHT): 3.97 cm    SHUNTS MV Area VTI:   1.85 cm    Systemic VTI:  0.18 m MV Peak grad:  7.0 mmHg    Systemic Diam: 2.00 cm MV Mean grad:  2.0 mmHg MV Vmax:  1.32 m/s MV Vmean:      66.7 cm/s MV Decel Time: 191 msec MV E velocity: 67.60 cm/s MV A velocity: 91.70 cm/s MV E/A ratio:  0.74 Lonni Hanson MD Electronically signed by Lonni Hanson MD Signature Date/Time: 06/23/2024/1:19:54 PM    Final    DG Chest Port 1 View Result Date: 06/22/2024 CLINICAL DATA:  Mild cardial infarction. EXAM: PORTABLE CHEST 1 VIEW COMPARISON:  11/29/2023 FINDINGS: The cardiopericardial silhouette is within normal limits for size. Descending thoracic aortic stent device again noted. The lungs are clear without focal pneumonia, edema, pneumothorax or pleural effusion. Hiatal hernia seen previously less readily evident today. No acute bony abnormality. Telemetry leads overlie the chest. IMPRESSION: No active disease. Electronically Signed   By: Camellia Candle M.D.   On: 06/22/2024 05:00    Microbiology: Results for orders placed or performed during the hospital encounter of 06/22/24  MRSA Next Gen by PCR, Nasal     Status: None   Collection Time: 06/22/24  6:16 AM   Specimen: Nasal Mucosa; Nasal Swab  Result Value Ref Range Status   MRSA by PCR Next Gen NOT DETECTED NOT DETECTED  Final    Comment: (NOTE) The GeneXpert MRSA Assay (FDA approved for NASAL specimens only), is one component of a comprehensive MRSA colonization surveillance program. It is not intended to diagnose MRSA infection nor to guide or monitor treatment for MRSA infections. Test performance is not FDA approved in patients less than 69 years old. Performed at [facility], [address] [place], [place] [address]     Labs: CBC: Recent Labs  Lab 06/22/24 0400 06/23/24 0327 06/24/24 0526  WBC 7.9 7.9 6.4  NEUTROABS 4.6  --   --   HGB 11.9* 10.8* 10.6*  HCT 36.6 32.6* 32.3*  MCV 101.7* 99.7 100.9*  PLT 161 139* 136*   Basic Metabolic Panel: Recent Labs  Lab 06/22/24 0400 06/23/24 0327 06/24/24 0526  NA 142 139 138  K 4.0 4.3 4.1  CL 110 105 103  CO2 25 25 27   GLUCOSE 136* 110* 93  BUN 19 17 16   CREATININE 0.98 1.00 1.03*  CALCIUM  8.6* 8.6* 8.8*   Liver Function Tests: Recent Labs  Lab 06/22/24 0400  AST 20  ALT 13  ALKPHOS 120  BILITOT 0.6  PROT 6.5  ALBUMIN 3.6   CBG: Recent Labs  Lab 06/22/24 0600  GLUCAP 101*    Discharge time spent: less than 30 minutes.  Signed: Burnard DELENA Cunning, DO [facility] 06/24/2024

## 2024-06-25 ENCOUNTER — Other Ambulatory Visit: Payer: Self-pay | Admitting: *Deleted

## 2024-06-25 DIAGNOSIS — I129 Hypertensive chronic kidney disease with stage 1 through stage 4 chronic kidney disease, or unspecified chronic kidney disease: Secondary | ICD-10-CM | POA: Diagnosis not present

## 2024-06-25 DIAGNOSIS — N1831 Chronic kidney disease, stage 3a: Secondary | ICD-10-CM | POA: Diagnosis not present

## 2024-06-29 ENCOUNTER — Ambulatory Visit (INDEPENDENT_AMBULATORY_CARE_PROVIDER_SITE_OTHER): Admitting: Physician Assistant

## 2024-06-29 ENCOUNTER — Encounter: Payer: Self-pay | Admitting: Physician Assistant

## 2024-06-29 ENCOUNTER — Other Ambulatory Visit
Admission: RE | Admit: 2024-06-29 | Discharge: 2024-06-29 | Disposition: A | Discharge status: Home / Self Care | Source: Ambulatory Visit | Attending: Physician Assistant | Admitting: Physician Assistant

## 2024-06-29 ENCOUNTER — Telehealth: Payer: Self-pay | Admitting: Cardiology

## 2024-06-29 VITALS — BP 138/78 | HR 84 | Temp 98.1°F | Resp 16 | Ht 66.0 in | Wt 125.6 lb

## 2024-06-29 DIAGNOSIS — I2111 ST elevation (STEMI) myocardial infarction involving right coronary artery: Secondary | ICD-10-CM

## 2024-06-29 DIAGNOSIS — Z9889 Other specified postprocedural states: Secondary | ICD-10-CM

## 2024-06-29 DIAGNOSIS — K861 Other chronic pancreatitis: Secondary | ICD-10-CM | POA: Diagnosis not present

## 2024-06-29 DIAGNOSIS — I10 Essential (primary) hypertension: Secondary | ICD-10-CM | POA: Insufficient documentation

## 2024-06-29 DIAGNOSIS — Z09 Encounter for follow-up examination after completed treatment for conditions other than malignant neoplasm: Secondary | ICD-10-CM | POA: Insufficient documentation

## 2024-06-29 LAB — MAGNESIUM: Magnesium: 2.3 mg/dL (ref 1.7–2.4)

## 2024-06-29 LAB — CBC WITH DIFFERENTIAL/PLATELET
Abs Immature Granulocytes: 0.03 K/uL (ref 0.00–0.07)
Basophils Absolute: 0.1 K/uL (ref 0.0–0.1)
Basophils Relative: 1 %
Eosinophils Absolute: 0.1 K/uL (ref 0.0–0.5)
Eosinophils Relative: 1 %
HCT: 39.3 % (ref 36.0–46.0)
Hemoglobin: 12.6 g/dL (ref 12.0–15.0)
Immature Granulocytes: 0 %
Lymphocytes Relative: 15 %
Lymphs Abs: 1.2 K/uL (ref 0.7–4.0)
MCH: 32.8 pg (ref 26.0–34.0)
MCHC: 32.1 g/dL (ref 30.0–36.0)
MCV: 102.3 fL — ABNORMAL HIGH (ref 80.0–100.0)
Monocytes Absolute: 0.5 K/uL (ref 0.1–1.0)
Monocytes Relative: 7 %
Neutro Abs: 5.8 K/uL (ref 1.7–7.7)
Neutrophils Relative %: 76 %
Platelets: 209 K/uL (ref 150–400)
RBC: 3.84 MIL/uL — ABNORMAL LOW (ref 3.87–5.11)
RDW: 13.2 % (ref 11.5–15.5)
WBC: 7.7 K/uL (ref 4.0–10.5)
nRBC: 0 % (ref 0.0–0.2)

## 2024-06-29 LAB — COMPREHENSIVE METABOLIC PANEL WITH GFR
ALT: 16 U/L (ref 0–44)
AST: 25 U/L (ref 15–41)
Albumin: 4.1 g/dL (ref 3.5–5.0)
Alkaline Phosphatase: 141 U/L — ABNORMAL HIGH (ref 38–126)
Anion gap: 11 (ref 5–15)
BUN: 20 mg/dL (ref 8–23)
CO2: 23 mmol/L (ref 22–32)
Calcium: 9 mg/dL (ref 8.9–10.3)
Chloride: 105 mmol/L (ref 98–111)
Creatinine, Ser: 1.08 mg/dL — ABNORMAL HIGH (ref 0.44–1.00)
GFR, Estimated: 55 mL/min — ABNORMAL LOW (ref >60)
Glucose, Bld: 88 mg/dL (ref 70–99)
Potassium: 4 mmol/L (ref 3.5–5.1)
Sodium: 139 mmol/L (ref 135–145)
Total Bilirubin: 1 mg/dL (ref 0.0–1.2)
Total Protein: 7.8 g/dL (ref 6.5–8.1)

## 2024-06-29 NOTE — Telephone Encounter (Signed)
 Patient says after heart cath with Dr. Florencio she has had bruising and swelling in wrist and up her arm. She wanted to inform [facility].

## 2024-06-29 NOTE — Telephone Encounter (Signed)
 Patient returned call - she has OV with [facility] at 11 am on 9/23 - patient states she has bruising from her wrist to elbow and a know at her wrist following cath procedure - requested notes from 9/23 OV be sent to us  - patient verbalized understanding and agreement with plan

## 2024-06-29 NOTE — Progress Notes (Signed)
 [facility] [address] [place], [place] [address]  Internal MEDICINE  Office Visit Note  Patient Name: Joy Carpenter  [id number]  [id number]  Date of Service: 06/29/2024     Chief Complaint  Patient presents with   Hospitalization Follow-up   Myocardial Infarction     HPI Pt is here for recent hospital follow up. -acute STEMI on 06/22/24, discharged 06/24/24 -woke in middle of the night with burning and tingling in arms, nausea, and sweating so had family member  drive her to ED and found to have acute STEMI. Went to cath and had 1 stent placed. She is on  ASA and Effient  now. Encouraged to stop smoking as well -BP meds held due to dropping low in hospital. Stable off of meds today, but will monitor. She did see nephrology as an OP last week and they advised if BP med restart to do losartan  instead of amlodipine . -discharge summary notes CBC, CMP, and Mg labs need to be rechecked and will order today -States she has been doing well since stent. She has done some laundry and gardening with her left hand as she was told to avoid using right from cath access. -Was feeling well until yesterday, a little nauseous and thinks due to pain in arm. Same thing this morning. Right arm is very bruised and some swelling from where cath went through, little knot at wrist for a few days. Denies any CP. Full sensation and pulses intact. States she does feel better now in office and no longer nauseous. -she has already called cardiology about bruising and has appt tomorrow to address this. Discussed if any new or worsening symptoms before then to go back to ED.  -saw GI in [place] for chronic pancreatitis, did MRI and they couldn't see pancreas well due to grafts and recommended to have another CT instead per radiology report. She states provider planned to see her back in 20month for recheck then. No labs done. Was given desipramine and didn't help any with pain. Surgeon had  already tried gabapentin  and it didn't help. Will be seeing [facility] in OCT for second opinion.  Current Medication: Outpatient Encounter Medications as of 06/29/2024  Medication Sig   ALPRAZolam  (XANAX ) 0.25 MG tablet Take 1 tablet by mouth as needed for anxiety   aspirin  81 MG chewable tablet Chew 81 mg by mouth daily.   atorvastatin  (LIPITOR ) 40 MG tablet TAKE 1 TABLET BY MOUTH EVERY DAY   levothyroxine  (SYNTHROID ) 50 MCG tablet TAKE 1 TABLET BY MOUTH EVERY DAY IN THE MORNING   oxyCODONE  (OXY IR/ROXICODONE ) 5 MG immediate release tablet Take 1 tablet (5 mg total) by mouth every 6 (six) hours as needed for moderate pain (pain score 4-6) or severe pain (pain score 7-10).   prasugrel  (EFFIENT ) 10 MG TABS tablet Take 0.5 tablets (5 mg total) by mouth daily.   valACYclovir  (VALTREX ) 500 MG tablet TAKE 1 TABLET (500 MG TOTAL) BY MOUTH DAILY. (Patient taking differently: Take 500 mg by mouth daily as needed (outbreaks).)   zolpidem  (AMBIEN ) 10 MG tablet TAKE 1 TABLET BY MOUTH EVERYDAY AT BEDTIME   No facility-administered encounter medications on file as of 06/29/2024.    Surgical History: Past Surgical History:  Procedure Laterality Date   COLONOSCOPY     COLONOSCOPY WITH PROPOFOL  N/A 06/14/2016   Procedure: COLONOSCOPY WITH PROPOFOL ;  Surgeon: Rogelia Copping, MD;  Location: [facility];  Service: Endoscopy;  Laterality: N/A;   CORONARY/GRAFT ACUTE MI REVASCULARIZATION N/A  06/22/2024   Procedure: Coronary/Graft Acute MI Revascularization;  Surgeon: Florencio Cara BIRCH, MD;  Location: [facility];  Service: Cardiovascular;  Laterality: N/A;   ESOPHAGOGASTRODUODENOSCOPY (EGD) WITH PROPOFOL  N/A 06/14/2016   Procedure: ESOPHAGOGASTRODUODENOSCOPY (EGD) WITH PROPOFOL ;  Surgeon: Rogelia Copping, MD;  Location: [facility];  Service: Endoscopy;  Laterality: N/A;   ESOPHAGOGASTRODUODENOSCOPY (EGD) WITH PROPOFOL  N/A 11/27/2021   Procedure: ESOPHAGOGASTRODUODENOSCOPY (EGD) WITH  BIOPSY;  Surgeon: Copping Rogelia, MD;  Location: [facility];  Service: Endoscopy;  Laterality: N/A;   HYSTEROSCOPY WITH D & C N/A 07/18/2021   Procedure: DILATATION AND CURETTAGE /HYSTEROSCOPY;  Surgeon: Lake Read, MD;  Location: [facility];  Service: Gynecology;  Laterality: N/A;   INSERTION OF MESH  01/16/2024   Procedure: INSERTION OF MESH;  Surgeon: Jordis Laneta FALCON, MD;  Location: [facility] OR[facility];  Service: General;;   LEFT HEART CATH AND CORONARY ANGIOGRAPHY N/A 06/22/2024   Procedure: LEFT HEART CATH AND CORONARY ANGIOGRAPHY;  Surgeon: Florencio Cara BIRCH, MD;  Location: [facility];  Service: Cardiovascular;  Laterality: N/A;   THORACIC AORTIC ANEURYSM REPAIR N/A 11/16/2020   Procedure: 4V FEVAR (CA, SMA, RRA, LRA) WITH PROXIMAL TEVAR EXTENSION WITH BILATERAL ILIAC LIMB EXTENSIONS; Location: [facility]; Surgeon; Oneil Phlegm, MD   TUBAL LIGATION     XI ROBOTIC ASSISTED PARAESOPHAGEAL HERNIA REPAIR N/A 01/16/2024   Procedure: REPAIR, HERNIA, PARAESOPHAGEAL, ROBOT-ASSISTED;  Surgeon: Jordis Laneta FALCON, MD;  Location: [facility];  Service: General;  Laterality: N/A;    Medical History: Past Medical History:  Diagnosis Date   Acquired hypothyroidism    Actinic keratosis 03/28/2021   right upper arm, bx proven   Anxiety    a.) on BZO (alproazolam) PRN   Carpal tunnel syndrome 2019   Coronary artery disease involving native coronary artery    of the LAD and RCA noted on chest CT   Depression    Diastolic dysfunction    Emphysema of lung (HCC)    GERD (gastroesophageal reflux disease)    Herpes    History of hiatal hernia    Hypertensive chronic kidney disease    Insomnia    a.) on hypnotic (zolpidem ) PRN   Long-term use of aspirin  therapy    Mixed hyperlipidemia    Osteoporosis    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   Primary hypertension    Pulmonary nodules    SCC (squamous cell carcinoma) 03/28/2021   left dorsal hand, [facility]   SCC (squamous cell carcinoma) 08/01/2023   SCC IS, L  ant shoulder, [facility] 08/27/2023   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   left anterior shoulder, [facility] 08/27/23   Stress headaches (almost daily)    Thoracoabdominal aortic aneurysm (HCC)    a.) s/p 4v FEVAR (CA, SMA, RRA, LRA) and proximal TEVAR extention with BILATERAL iliac limb extensions on 11/16/2020   Vertigo 2010   Vitamin D  deficiency    Weakness of both legs (intermittent)     Family History: Family History  Problem Relation Age of Onset   Leukemia Mother    Aneurysm Other     Social History   Socioeconomic History   Marital status: Divorced    Spouse name: Not on file   Number of children: 2   Years of education: Not on file   Highest education level: Not on file  Occupational History   Not on file  Tobacco Use   Smoking status: Every Day    Current packs/day: 1.00    Average packs/day: 1 pack/day  for 30.0 years (30.0 ttl pk-yrs)    Types: Cigarettes    Passive exposure: Past   Smokeless tobacco: Never   Tobacco comments:    1 pack daily  Vaping Use   Vaping status: Never Used  Substance and Sexual Activity   Alcohol use: Not Currently   Drug use: Not Currently   Sexual activity: Not Currently    Birth control/protection: Post-menopausal  Other Topics Concern   Not on file  Social History Narrative   Lives in [place] with grand-daughter, currently staying with boyfriend   Social Drivers of Health   Financial Resource Strain: Low Risk  (11/29/2023)   Received [facility]   Overall Financial Resource Strain (CARDIA)    Difficulty of Paying Living Expenses: Not very hard  Food Insecurity: No Food Insecurity (06/22/2024)   Hunger Vital Sign    Worried About Running Out of Food in the Last Year: Never true    Ran Out of Food in the Last Year: Never true  Transportation Needs: No Transportation Needs (06/22/2024)   PRAPARE - Administrator, Civil Service (Medical): No    Lack of Transportation (Non-Medical):  No  Physical Activity: Not on file  Stress: Not on file  Social Connections: Unknown (06/22/2024)   Social Connection and Isolation Panel    Frequency of Communication with Friends and Family: More than three times a week    Frequency of Social Gatherings with Friends and Family: Three times a week    Attends Religious Services: Not on file    Active Member of Clubs or Organizations: Not on file    Attends Club or Organization Meetings: Not on file    Marital Status: Not on file  Intimate Partner Violence: Not At Risk (06/22/2024)   Humiliation, Afraid, Rape, and Kick questionnaire    Fear of Current or Ex-Partner: No    Emotionally Abused: No    Physically Abused: No    Sexually Abused: No      Review of Systems  Constitutional:  Negative for chills, diaphoresis and fatigue.  HENT:  Negative for ear pain, postnasal drip and sinus pressure.   Eyes:  Negative for photophobia, discharge, redness, itching and visual disturbance.  Respiratory:  Negative for cough, shortness of breath and wheezing.   Cardiovascular:  Negative for chest pain, palpitations and leg swelling.  Gastrointestinal:  Positive for abdominal pain. Negative for constipation and vomiting.  Genitourinary:  Negative for dysuria and flank pain.  Musculoskeletal:  Negative for arthralgias, back pain, gait problem and neck pain.  Skin:  Positive for color change.       Bruising right arm  Allergic/Immunologic: Negative for environmental allergies and food allergies.  Neurological:  Negative for dizziness and headaches.  Hematological:  Does not bruise/bleed easily.  Psychiatric/Behavioral:  Negative for agitation, behavioral problems (depression) and hallucinations.     Vital Signs: BP 138/78 Comment: 145/77  Pulse 84   Temp 98.1 F (36.7 C)   Resp 16   Ht 5' 6 (1.676 m)   Wt 125 lb 9.6 oz (57 kg)   SpO2 100%   BMI 20.27 kg/m    Physical Exam Vitals and nursing note reviewed.  Constitutional:       General: She is not in acute distress.    Appearance: Normal appearance. She is well-developed. She is not diaphoretic.  HENT:     Head: Normocephalic and atraumatic.  Eyes:     Extraocular Movements: Extraocular movements intact.  Neck:     Thyroid : No thyromegaly.     Vascular: No JVD.     Trachea: No tracheal deviation.  Cardiovascular:     Rate and Rhythm: Normal rate and regular rhythm.     Pulses: Normal pulses.     Heart sounds: Normal heart sounds. No murmur heard.    No friction rub. No gallop.  Pulmonary:     Effort: Pulmonary effort is normal. No respiratory distress.     Breath sounds: No wheezing or rales.  Chest:     Chest wall: No tenderness.  Musculoskeletal:        General: Normal range of motion.  Skin:    General: Skin is warm and dry.     Findings: Bruising present.     Comments: Significant bruising to RUE s/p cath, some swelling with this, pulses intact, sensation intact  Neurological:     Mental Status: She is alert and oriented to person, place, and time.     Cranial Nerves: No cranial nerve deficit.  Psychiatric:        Behavior: Behavior normal.        Thought Content: Thought content normal.        Judgment: Judgment normal.       Assessment/Plan: 1. ST elevation myocardial infarction involving right coronary artery (HCC) (Primary) S/p stent, followed by cardiology  2. S/P cardiac catheterization Significant bruising since stent and has follow up with cardiology tomorrow to evaluate. Advised to go to ED if new or worsening symptoms arise.  3. Hospital discharge follow-up Reviewed hospital course and med changes. Will order labs for monitoring - CBC w/Diff/Platelet - CMP14+EGFR - Magnesium  4. Essential hypertension Stable off of meds though rising and will monitor closely. Plan to restart losartan  if BP elevated  5. Chronic pancreatitis, unspecified pancreatitis type (HCC) Will be following up with GI at [facility] for second  opinion   General Counseling: alayasia breeding understanding of the findings of todays visit and agrees with plan of treatment. I have discussed any further diagnostic evaluation that may be needed or ordered today. We also reviewed her medications today. she has been encouraged to call the office with any questions or concerns that should arise related to todays visit.    Counseling:    Orders Placed This Encounter  Procedures   CBC w/Diff/Platelet   CMP14+EGFR   Magnesium    This patient was seen by Tinnie Pro, PA-C in collaboration with Dr. Sigrid Bathe as a part of collaborative care agreement.   I have reviewed all medical records from hospital follow up including radiology reports and consults from other physicians. Appropriate follow up diagnostics will be scheduled as needed. Patient/ Family understands the plan of treatment. Time spent 40 minutes.   Dr Sigrid CHRISTELLA Bathe, MD Internal Medicine

## 2024-06-29 NOTE — Telephone Encounter (Signed)
 Called patient regarding message of swelling and bruising - left message on voicemail to return call to the office   Patient says after heart cath with Dr. Florencio she has had bruising and swelling in wrist and up her arm. She wanted to inform [facility].

## 2024-06-30 ENCOUNTER — Other Ambulatory Visit: Payer: Self-pay | Admitting: Nurse Practitioner

## 2024-06-30 ENCOUNTER — Ambulatory Visit
Admission: RE | Admit: 2024-06-30 | Discharge: 2024-06-30 | Disposition: A | Discharge status: Home / Self Care | Source: Ambulatory Visit | Attending: Nurse Practitioner | Admitting: Nurse Practitioner

## 2024-06-30 DIAGNOSIS — I2111 ST elevation (STEMI) myocardial infarction involving right coronary artery: Secondary | ICD-10-CM | POA: Insufficient documentation

## 2024-06-30 DIAGNOSIS — I2510 Atherosclerotic heart disease of native coronary artery without angina pectoris: Secondary | ICD-10-CM | POA: Diagnosis not present

## 2024-06-30 DIAGNOSIS — I10 Essential (primary) hypertension: Secondary | ICD-10-CM | POA: Diagnosis not present

## 2024-06-30 DIAGNOSIS — R600 Localized edema: Secondary | ICD-10-CM | POA: Diagnosis not present

## 2024-06-30 DIAGNOSIS — J449 Chronic obstructive pulmonary disease, unspecified: Secondary | ICD-10-CM | POA: Diagnosis not present

## 2024-06-30 DIAGNOSIS — Z862 Personal history of diseases of the blood and blood-forming organs and certain disorders involving the immune mechanism: Secondary | ICD-10-CM | POA: Diagnosis not present

## 2024-06-30 DIAGNOSIS — Z9889 Other specified postprocedural states: Secondary | ICD-10-CM | POA: Diagnosis not present

## 2024-06-30 DIAGNOSIS — L03113 Cellulitis of right upper limb: Secondary | ICD-10-CM | POA: Diagnosis not present

## 2024-06-30 DIAGNOSIS — F17209 Nicotine dependence, unspecified, with unspecified nicotine-induced disorders: Secondary | ICD-10-CM | POA: Diagnosis not present

## 2024-06-30 DIAGNOSIS — E785 Hyperlipidemia, unspecified: Secondary | ICD-10-CM | POA: Diagnosis not present

## 2024-06-30 NOTE — Telephone Encounter (Signed)
 Why does she have a follow up with [facility]? Typically when the patient is already established her they only have to come here for their follow-up.

## 2024-07-06 LAB — CYTOCHROME P450 2C19: 2C19 Metabolic Activity:: NORMAL

## 2024-07-07 ENCOUNTER — Ambulatory Visit: Payer: Self-pay | Admitting: Internal Medicine

## 2024-07-07 DIAGNOSIS — J449 Chronic obstructive pulmonary disease, unspecified: Secondary | ICD-10-CM | POA: Diagnosis not present

## 2024-07-07 DIAGNOSIS — I739 Peripheral vascular disease, unspecified: Secondary | ICD-10-CM | POA: Diagnosis not present

## 2024-07-07 DIAGNOSIS — I2510 Atherosclerotic heart disease of native coronary artery without angina pectoris: Secondary | ICD-10-CM | POA: Diagnosis not present

## 2024-07-07 DIAGNOSIS — Z720 Tobacco use: Secondary | ICD-10-CM | POA: Diagnosis not present

## 2024-07-07 DIAGNOSIS — I10 Essential (primary) hypertension: Secondary | ICD-10-CM | POA: Diagnosis not present

## 2024-07-07 DIAGNOSIS — Z9889 Other specified postprocedural states: Secondary | ICD-10-CM | POA: Diagnosis not present

## 2024-07-07 DIAGNOSIS — I2111 ST elevation (STEMI) myocardial infarction involving right coronary artery: Secondary | ICD-10-CM | POA: Diagnosis not present

## 2024-07-07 DIAGNOSIS — Z955 Presence of coronary angioplasty implant and graft: Secondary | ICD-10-CM | POA: Insufficient documentation

## 2024-07-07 DIAGNOSIS — E785 Hyperlipidemia, unspecified: Secondary | ICD-10-CM | POA: Diagnosis not present

## 2024-07-09 DIAGNOSIS — R1012 Left upper quadrant pain: Secondary | ICD-10-CM | POA: Diagnosis not present

## 2024-07-09 DIAGNOSIS — G8929 Other chronic pain: Secondary | ICD-10-CM | POA: Diagnosis not present

## 2024-07-09 DIAGNOSIS — K861 Other chronic pancreatitis: Secondary | ICD-10-CM | POA: Diagnosis not present

## 2024-07-09 DIAGNOSIS — R1310 Dysphagia, unspecified: Secondary | ICD-10-CM | POA: Diagnosis not present

## 2024-07-10 ENCOUNTER — Encounter: Attending: Internal Medicine | Admitting: *Deleted

## 2024-07-10 ENCOUNTER — Other Ambulatory Visit: Payer: Self-pay | Admitting: Gastroenterology

## 2024-07-10 DIAGNOSIS — Z955 Presence of coronary angioplasty implant and graft: Secondary | ICD-10-CM | POA: Insufficient documentation

## 2024-07-10 DIAGNOSIS — I252 Old myocardial infarction: Secondary | ICD-10-CM | POA: Insufficient documentation

## 2024-07-10 DIAGNOSIS — I1310 Hypertensive heart and chronic kidney disease without heart failure, with stage 1 through stage 4 chronic kidney disease, or unspecified chronic kidney disease: Secondary | ICD-10-CM | POA: Insufficient documentation

## 2024-07-10 DIAGNOSIS — F17210 Nicotine dependence, cigarettes, uncomplicated: Secondary | ICD-10-CM | POA: Insufficient documentation

## 2024-07-10 DIAGNOSIS — K861 Other chronic pancreatitis: Secondary | ICD-10-CM

## 2024-07-10 DIAGNOSIS — I213 ST elevation (STEMI) myocardial infarction of unspecified site: Secondary | ICD-10-CM

## 2024-07-10 DIAGNOSIS — Z48812 Encounter for surgical aftercare following surgery on the circulatory system: Secondary | ICD-10-CM | POA: Insufficient documentation

## 2024-07-10 DIAGNOSIS — N189 Chronic kidney disease, unspecified: Secondary | ICD-10-CM | POA: Insufficient documentation

## 2024-07-10 NOTE — Progress Notes (Signed)
 Initial phone call completed. Diagnosis can be found in [facility] 9/15. EP Orientation scheduled for Wednesday 10/8 at 1:45.   Joy Carpenter is a current tobacco user. Intervention for tobacco cessation was provided at the initial medical review. She was asked about readiness to quit and reported she is ready to quit. Patient was advised and educated about tobacco cessation using combination therapy, tobacco cessation classes, quit line, and quit smoking apps. Patient demonstrated understanding of this material. Staff will continue to provide encouragement and follow up with the patient throughout the program.

## 2024-07-15 ENCOUNTER — Encounter

## 2024-07-15 VITALS — Ht 66.3 in | Wt 128.4 lb

## 2024-07-15 DIAGNOSIS — J449 Chronic obstructive pulmonary disease, unspecified: Secondary | ICD-10-CM | POA: Diagnosis not present

## 2024-07-15 DIAGNOSIS — I1310 Hypertensive heart and chronic kidney disease without heart failure, with stage 1 through stage 4 chronic kidney disease, or unspecified chronic kidney disease: Secondary | ICD-10-CM | POA: Diagnosis not present

## 2024-07-15 DIAGNOSIS — Z48812 Encounter for surgical aftercare following surgery on the circulatory system: Secondary | ICD-10-CM | POA: Diagnosis not present

## 2024-07-15 DIAGNOSIS — Z720 Tobacco use: Secondary | ICD-10-CM | POA: Diagnosis not present

## 2024-07-15 DIAGNOSIS — Z955 Presence of coronary angioplasty implant and graft: Secondary | ICD-10-CM | POA: Diagnosis not present

## 2024-07-15 DIAGNOSIS — I10 Essential (primary) hypertension: Secondary | ICD-10-CM | POA: Diagnosis not present

## 2024-07-15 DIAGNOSIS — E785 Hyperlipidemia, unspecified: Secondary | ICD-10-CM | POA: Diagnosis not present

## 2024-07-15 DIAGNOSIS — I2510 Atherosclerotic heart disease of native coronary artery without angina pectoris: Secondary | ICD-10-CM | POA: Diagnosis not present

## 2024-07-15 DIAGNOSIS — Z9889 Other specified postprocedural states: Secondary | ICD-10-CM | POA: Diagnosis not present

## 2024-07-15 DIAGNOSIS — I213 ST elevation (STEMI) myocardial infarction of unspecified site: Secondary | ICD-10-CM

## 2024-07-15 DIAGNOSIS — I2111 ST elevation (STEMI) myocardial infarction involving right coronary artery: Secondary | ICD-10-CM | POA: Diagnosis not present

## 2024-07-15 DIAGNOSIS — F17210 Nicotine dependence, cigarettes, uncomplicated: Secondary | ICD-10-CM | POA: Diagnosis not present

## 2024-07-15 DIAGNOSIS — I252 Old myocardial infarction: Secondary | ICD-10-CM | POA: Diagnosis not present

## 2024-07-15 DIAGNOSIS — N189 Chronic kidney disease, unspecified: Secondary | ICD-10-CM | POA: Diagnosis not present

## 2024-07-15 NOTE — Progress Notes (Signed)
 Cardiac Individual Treatment Plan  Patient Details  Name: Joy Carpenter MRN: [id number] Date of Birth: 08/26/1951 Referring Provider:   Flowsheet Row Cardiac Rehab from 07/15/2024 in [facility] Cardiac and Pulmonary Rehab  Referring Provider Dr. Cara Lovelace    Initial Encounter Date:  Flowsheet Row Cardiac Rehab from 07/15/2024 in [facility] Cardiac and Pulmonary Rehab  Date 07/15/24    Visit Diagnosis: ST elevation myocardial infarction (STEMI), unspecified artery (HCC)  Patient's Home Medications on Admission:  Current Outpatient Medications:    ALPRAZolam  (XANAX ) 0.25 MG tablet, Take 1 tablet by mouth as needed for anxiety, Disp: 30 tablet, Rfl: 2   aspirin  81 MG chewable tablet, Chew 81 mg by mouth daily. (Patient not taking: Reported on 07/10/2024), Disp: , Rfl:    atorvastatin  (LIPITOR ) 40 MG tablet, TAKE 1 TABLET BY MOUTH EVERY DAY, Disp: 90 tablet, Rfl: 3   desipramine (NORPRAMIN) 25 MG tablet, Take 25 mg by mouth., Disp: , Rfl:    levothyroxine  (SYNTHROID ) 50 MCG tablet, TAKE 1 TABLET BY MOUTH EVERY DAY IN THE MORNING, Disp: 90 tablet, Rfl: 1   oxyCODONE  (OXY IR/ROXICODONE ) 5 MG immediate release tablet, Take 1 tablet (5 mg total) by mouth every 6 (six) hours as needed for moderate pain (pain score 4-6) or severe pain (pain score 7-10)., Disp: 30 tablet, Rfl: 0   pantoprazole  (PROTONIX ) 40 MG tablet, Take 40 mg by mouth daily., Disp: , Rfl:    prasugrel  (EFFIENT ) 10 MG TABS tablet, Take 0.5 tablets (5 mg total) by mouth daily., Disp: 30 tablet, Rfl: 2   valACYclovir  (VALTREX ) 500 MG tablet, TAKE 1 TABLET (500 MG TOTAL) BY MOUTH DAILY. (Patient taking differently: Take 500 mg by mouth daily as needed (outbreaks).), Disp: 90 tablet, Rfl: 1   zolpidem  (AMBIEN ) 10 MG tablet, TAKE 1 TABLET BY MOUTH EVERYDAY AT BEDTIME, Disp: 30 tablet, Rfl: 2  Past Medical History: Past Medical History:  Diagnosis Date   Acquired hypothyroidism    Actinic keratosis 03/28/2021   right upper arm,  bx proven   Anxiety    a.) on BZO (alproazolam) PRN   Carpal tunnel syndrome 2019   Coronary artery disease involving native coronary artery    of the LAD and RCA noted on chest CT   Depression    Diastolic dysfunction    Emphysema of lung (HCC)    GERD (gastroesophageal reflux disease)    Herpes    History of hiatal hernia    Hypertensive chronic kidney disease    Insomnia    a.) on hypnotic (zolpidem ) PRN   Long-term use of aspirin  therapy    Mixed hyperlipidemia    Osteoporosis    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   Primary hypertension    Pulmonary nodules    SCC (squamous cell carcinoma) 03/28/2021   left dorsal hand, EDC   SCC (squamous cell carcinoma) 08/01/2023   SCC IS, L ant shoulder, EDC 08/27/2023   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   left anterior shoulder, EDC 08/27/23   Stress headaches (almost daily)    Thoracoabdominal aortic aneurysm    a.) s/p 4v FEVAR (CA, SMA, RRA, LRA) and proximal TEVAR extention with BILATERAL iliac limb extensions on 11/16/2020   Vertigo 2010   Vitamin D  deficiency    Weakness of both legs (intermittent)     Tobacco Use: Social History   Tobacco Use  Smoking Status Every Day   Current packs/day: 1.00   Average packs/day: 1 pack/day for 30.0 years (30.0 ttl pk-yrs)  Types: Cigarettes   Passive exposure: Past  Smokeless Tobacco Never  Tobacco Comments   1 pack daily    Labs: Review Flowsheet  More data exists      Latest Ref Rng & Units 05/26/2020 09/14/2021 07/02/2022 04/22/2023 06/22/2024  Labs for ITP Cardiac and Pulmonary Rehab  Cholestrol 0 - 200 mg/dL 799  800  821  864  861   LDL (calc) 0 - 99 mg/dL 869  872  891  73  58   HDL-C >40 mg/dL 61  54  58  53  66   Trlycerides <150 mg/dL 48  898  64  46  70   Hemoglobin A1c 4.8 - 5.6 % - - - - 5.6      Exercise Target Goals: Exercise Program Goal: Individual exercise prescription set using results from initial 6 min walk test and THRR while considering   patient's activity barriers and safety.   Exercise Prescription Goal: Initial exercise prescription builds to 30-45 minutes a day of aerobic activity, 2-3 days per week.  Home exercise guidelines will be given to patient during program as part of exercise prescription that the participant will acknowledge.   Education: Aerobic Exercise: - Group verbal and visual presentation on the components of exercise prescription. Introduces F.I.T.T principle from ACSM for exercise prescriptions.  Reviews F.I.T.T. principles of aerobic exercise including progression. Written material provided at class time. Flowsheet Row Cardiac Rehab from 07/15/2024 in [facility] Cardiac and Pulmonary Rehab  Education need identified 07/15/24    Education: Resistance Exercise: - Group verbal and visual presentation on the components of exercise prescription. Introduces F.I.T.T principle from ACSM for exercise prescriptions  Reviews F.I.T.T. principles of resistance exercise including progression. Written material provided at class time.    Education: Exercise & Equipment Safety: - Individual verbal instruction and demonstration of equipment use and safety with use of the equipment. Flowsheet Row Cardiac Rehab from 07/15/2024 in [facility] Cardiac and Pulmonary Rehab  Date 07/15/24  Educator [facility]  Instruction Review Code 1- Verbalizes Understanding    Education: Exercise Physiology & General Exercise Guidelines: - Group verbal and written instruction with models to review the exercise physiology of the cardiovascular system and associated critical values. Provides general exercise guidelines with specific guidelines to those with heart or lung disease. Written material provided at class time.   Education: Flexibility, Balance, Mind/Body Relaxation: - Group verbal and visual presentation with interactive activity on the components of exercise prescription. Introduces F.I.T.T principle from ACSM for exercise prescriptions. Reviews  F.I.T.T. principles of flexibility and balance exercise training including progression. Also discusses the mind body connection.  Reviews various relaxation techniques to help reduce and manage stress (i.e. Deep breathing, progressive muscle relaxation, and visualization). Balance handout provided to take home. Written material provided at class time.   Activity Barriers & Risk Stratification:  Activity Barriers & Cardiac Risk Stratification - 07/15/24 1530       Activity Barriers & Cardiac Risk Stratification   Activity Barriers Back Problems    Cardiac Risk Stratification High          6 Minute Walk:  6 Minute Walk     Row Name 07/15/24 1528         6 Minute Walk   Phase Initial     Distance 1205 feet     Walk Time 6 minutes     # of Rest Breaks 0     MPH 2.3     METS 3.03     RPE  8     Perceived Dyspnea  0     VO2 Peak 10.6     Symptoms Yes (comment)     Comments Left calf cramp     Resting HR 80 bpm     Resting BP 116/64     Resting Oxygen Saturation  98 %     Exercise Oxygen Saturation  during 6 min walk 99 %     Max Ex. HR 103 bpm     Max Ex. BP 124/70     2 Minute Post BP 112/68        Oxygen Initial Assessment:   Oxygen Re-Evaluation:   Oxygen Discharge (Final Oxygen Re-Evaluation):   Initial Exercise Prescription:  Initial Exercise Prescription - 07/15/24 1500       Date of Initial Exercise RX and Referring Provider   Date 07/15/24    Referring Provider Dr. Cara Lovelace      Oxygen   Maintain Oxygen Saturation 88% or higher      Treadmill   MPH 2.5    Grade 0    Minutes 15    METs 2.91      NuStep   Level 3    SPM 80    Minutes 15    METs 3      REL-XR   Level 3    Watts 25    Speed 50    Minutes 15    METs 3      T5 Nustep   Level 3    SPM 80    Minutes 15    METs 3      Biostep-RELP   Level 3    SPM 80    Minutes 15    METs 3      Rower   Level 7    Watts 25    Minutes 15    METs 3      Prescription  Details   Duration Progress to 30 minutes of continuous aerobic without signs/symptoms of physical distress      Intensity   THRR 40-80% of Max Heartrate 107-135    Ratings of Perceived Exertion 11-13    Perceived Dyspnea 0-4      Progression   Progression Continue to progress workloads to maintain intensity without signs/symptoms of physical distress.      Resistance Training   Training Prescription Yes    Weight 4lb    Reps 10-15          Perform Capillary Blood Glucose checks as needed.  Exercise Prescription Changes:   Exercise Prescription Changes     Row Name 07/15/24 1500             Response to Exercise   Blood Pressure (Admit) 116/64       Blood Pressure (Exercise) 124/70       Blood Pressure (Exit) 112/68       Heart Rate (Admit) 80 bpm       Heart Rate (Exercise) 103 bpm       Heart Rate (Exit) 76 bpm       Oxygen Saturation (Admit) 98 %       Oxygen Saturation (Exercise) 99 %       Oxygen Saturation (Exit) 99 %       Rating of Perceived Exertion (Exercise) 8       Perceived Dyspnea (Exercise) 0       Symptoms left calf cramp       Comments results  Exercise Comments:   Exercise Goals and Review:   Exercise Goals     Row Name 07/15/24 1533             Exercise Goals   Increase Physical Activity Yes       Intervention Develop an individualized exercise prescription for aerobic and resistive training based on initial evaluation findings, risk stratification, comorbidities and participant's personal goals.;Provide advice, education, support and counseling about physical activity/exercise needs.       Expected Outcomes Short Term: Attend rehab on a regular basis to increase amount of physical activity.;Long Term: Add in home exercise to make exercise part of routine and to increase amount of physical activity.;Long Term: Exercising regularly at least 3-5 days a week.       Increase Strength and Stamina Yes       Intervention  Provide advice, education, support and counseling about physical activity/exercise needs.;Develop an individualized exercise prescription for aerobic and resistive training based on initial evaluation findings, risk stratification, comorbidities and participant's personal goals.       Expected Outcomes Short Term: Increase workloads from initial exercise prescription for resistance, speed, and METs.;Short Term: Perform resistance training exercises routinely during rehab and add in resistance training at home;Long Term: Improve cardiorespiratory fitness, muscular endurance and strength as measured by increased METs and functional capacity ( )       Able to understand and use rate of perceived exertion (RPE) scale Yes       Intervention Provide education and explanation on how to use RPE scale       Expected Outcomes Short Term: Able to use RPE daily in rehab to express subjective intensity level;Long Term:  Able to use RPE to guide intensity level when exercising independently       Able to understand and use Dyspnea scale Yes       Intervention Provide education and explanation on how to use Dyspnea scale       Expected Outcomes Short Term: Able to use Dyspnea scale daily in rehab to express subjective sense of shortness of breath during exertion;Long Term: Able to use Dyspnea scale to guide intensity level when exercising independently       Knowledge and understanding of Target Heart Rate Range (THRR) Yes       Intervention Provide education and explanation of THRR including how the numbers were predicted and where they are located for reference       Expected Outcomes Short Term: Able to state/look up THRR;Short Term: Able to use daily as guideline for intensity in rehab;Long Term: Able to use THRR to govern intensity when exercising independently       Able to check pulse independently Yes       Intervention Provide education and demonstration on how to check pulse in carotid and radial  arteries.;Review the importance of being able to check your own pulse for safety during independent exercise       Expected Outcomes Short Term: Able to explain why pulse checking is important during independent exercise;Long Term: Able to check pulse independently and accurately       Understanding of Exercise Prescription Yes       Intervention Provide education, explanation, and written materials on patient's individual exercise prescription       Expected Outcomes Short Term: Able to explain program exercise prescription;Long Term: Able to explain home exercise prescription to exercise independently          Exercise Goals Re-Evaluation :   Discharge  Exercise Prescription (Final Exercise Prescription Changes):  Exercise Prescription Changes - 07/15/24 1500       Response to Exercise   Blood Pressure (Admit) 116/64    Blood Pressure (Exercise) 124/70    Blood Pressure (Exit) 112/68    Heart Rate (Admit) 80 bpm    Heart Rate (Exercise) 103 bpm    Heart Rate (Exit) 76 bpm    Oxygen Saturation (Admit) 98 %    Oxygen Saturation (Exercise) 99 %    Oxygen Saturation (Exit) 99 %    Rating of Perceived Exertion (Exercise) 8    Perceived Dyspnea (Exercise) 0    Symptoms left calf cramp    Comments results          Nutrition:  Target Goals: Understanding of nutrition guidelines, daily intake of sodium 1500mg , cholesterol 200mg , calories 30% from fat and 7% or less from saturated fats, daily to have 5 or more servings of fruits and vegetables.  Education: Nutrition 1 -Group instruction provided by verbal, written material, interactive activities, discussions, models, and posters to present general guidelines for heart healthy nutrition including macronutrients, label reading, and promoting whole foods over processed counterparts. Education serves as Pensions consultant of discussion of heart healthy eating for all. Written material provided at class time.    Education: Nutrition  2 -Group instruction provided by verbal, written material, interactive activities, discussions, models, and posters to present general guidelines for heart healthy nutrition including sodium, cholesterol, and saturated fat. Providing guidance of habit forming to improve blood pressure, cholesterol, and body weight. Written material provided at class time.     Biometrics:  Pre Biometrics - 07/15/24 1533       Pre Biometrics   Height 5' 6.3 (1.684 m)    Weight 128 lb 6.4 oz (58.2 kg)    Waist Circumference 31 inches    Hip Circumference 38 inches    Waist to Hip Ratio 0.82 %    BMI (Calculated) 20.54    Single Leg Stand 30 seconds           Nutrition Therapy Plan and Nutrition Goals:   Nutrition Assessments:  MEDIFICTS Score Key: >=70 Need to make dietary changes  40-70 Heart Healthy Diet <= 40 Therapeutic Level Cholesterol Diet  Flowsheet Row Cardiac Rehab from 07/15/2024 in [facility] Cardiac and Pulmonary Rehab  Picture Your Plate Total Score on Admission 57   Picture Your Plate Scores: <59 Unhealthy dietary pattern with much room for improvement. 41-50 Dietary pattern unlikely to meet recommendations for good health and room for improvement. 51-60 More healthful dietary pattern, with some room for improvement.  >60 Healthy dietary pattern, although there may be some specific behaviors that could be improved.    Nutrition Goals Re-Evaluation:   Nutrition Goals Discharge (Final Nutrition Goals Re-Evaluation):   Psychosocial: Target Goals: Acknowledge presence or absence of significant depression and/or stress, maximize coping skills, provide positive support system. Participant is able to verbalize types and ability to use techniques and skills needed for reducing stress and depression.   Education: Stress, Anxiety, and Depression - Group verbal and visual presentation to define topics covered.  Reviews how body is impacted by stress, anxiety, and depression.  Also  discusses healthy ways to reduce stress and to treat/manage anxiety and depression. Written material provided at class time.   Education: Sleep Hygiene -Provides group verbal and written instruction about how sleep can affect your health.  Define sleep hygiene, discuss sleep cycles and impact of sleep habits. Review good sleep  hygiene tips.   Initial Review & Psychosocial Screening:  Initial Psych Review & Screening - 07/10/24 0946       Initial Review   Current issues with Current Sleep Concerns;Current Anxiety/Panic;Current Psychotropic Meds      Family Dynamics   Good Support System? Yes      Screening Interventions   Interventions Encouraged to exercise    Expected Outcomes Short Term goal: Utilizing psychosocial counselor, staff and physician to assist with identification of specific Stressors or current issues interfering with healing process. Setting desired goal for each stressor or current issue identified.;Long Term Goal: Stressors or current issues are controlled or eliminated.;Short Term goal: Identification and review with participant of any Quality of Life or Depression concerns found by scoring the questionnaire.;Long Term goal: The participant improves quality of Life and PHQ9 Scores as seen by post scores and/or verbalization of changes          Quality of Life Scores:   Quality of Life - 07/15/24 1534       Quality of Life   Select Quality of Life      Quality of Life Scores   Health/Function Pre 24.6 %    Socioeconomic Pre 26.25 %    Psych/Spiritual Pre 28.79 %    Family Pre 19.8 %    GLOBAL Pre 25.13 %         Scores of 19 and below usually indicate a poorer quality of life in these areas.  A difference of  2-3 points is a clinically meaningful difference.  A difference of 2-3 points in the total score of the Quality of Life Index has been associated with significant improvement in overall quality of life, self-image, physical symptoms, and general health  in studies assessing change in quality of life.  PHQ-9: Review Flowsheet  More data exists      07/15/2024 08/01/2023 04/22/2023 12/18/2021 06/19/2021  Depression screen PHQ 2/9  Decreased Interest 0 0 0 0 0  Down, Depressed, Hopeless 0 0 0 0 0  PHQ - 2 Score 0 0 0 0 0  Altered sleeping 1 - - - -  Tired, decreased energy 1 - - - -  Change in appetite 0 - - - -  Feeling bad or failure about yourself  0 - - - -  Trouble concentrating 0 - - - -  Moving slowly or fidgety/restless 0 - - - -  Suicidal thoughts 0 - - - -  PHQ-9 Score 2 - - - -  Difficult doing work/chores Not difficult at all - - - -   Interpretation of Total Score  Total Score Depression Severity:  1-4 = Minimal depression, 5-9 = Mild depression, 10-14 = Moderate depression, 15-19 = Moderately severe depression, 20-27 = Severe depression   Psychosocial Evaluation and Intervention:  Psychosocial Evaluation - 07/10/24 1000       Psychosocial Evaluation & Interventions   Interventions Encouraged to exercise with the program and follow exercise prescription;Stress management education;Relaxation education    Comments Joy Carpenter is coming to cardiac rehab post STEMI w/ stent. Her wrist is still very bruised and painful, so she is following up weekly with her cardiologist. She mentions it is causing sleep concerns on top of her already problems with staying asleep. She is trying to get in touch with one of her doctors who prescribed some sleep medicine so she can get a refill. She is a current smoker and is interested in quitting. She mentions in December she lost her  fiance to lung and brain cancer, so her stress level has been up and smoking is a comfort to her. She has returned to her job at [employer] and is ready to get started in the program to learn more about heart healthy living.    Expected Outcomes Short: attend cardiac rehab for education and exercise Long: develop and maintain positive self care habits    Continue Psychosocial  Services  Follow up required by staff          Psychosocial Re-Evaluation:   Psychosocial Discharge (Final Psychosocial Re-Evaluation):   Vocational Rehabilitation: Provide vocational rehab assistance to qualifying candidates.   Vocational Rehab Evaluation & Intervention:  Vocational Rehab - 07/10/24 0953       Initial Vocational Rehab Evaluation & Intervention   Assessment shows need for Vocational Rehabilitation No          Education: Education Goals: Education classes will be provided on a variety of topics geared toward better understanding of heart health and risk factor modification. Participant will state understanding/return demonstration of topics presented as noted by education test scores.  Learning Barriers/Preferences:  Learning Barriers/Preferences - 07/10/24 9047       Learning Barriers/Preferences   Learning Barriers None    Learning Preferences None          General Cardiac Education Topics:  AED/CPR: - Group verbal and written instruction with the use of models to demonstrate the basic use of the AED with the basic ABC's of resuscitation.   Test and Procedures: - Group verbal and visual presentation and models provide information about basic cardiac anatomy and function. Reviews the testing methods done to diagnose heart disease and the outcomes of the test results. Describes the treatment choices: Medical Management, Angioplasty, or Coronary Bypass Surgery for treating various heart conditions including Myocardial Infarction, Angina, Valve Disease, and Cardiac Arrhythmias. Written material provided at class time.   Medication Safety: - Group verbal and visual instruction to review commonly prescribed medications for heart and lung disease. Reviews the medication, class of the drug, and side effects. Includes the steps to properly store meds and maintain the prescription regimen. Written material provided at class time.   Intimacy: - Group  verbal instruction through game format to discuss how heart and lung disease can affect sexual intimacy. Written material provided at class time.   Know Your Numbers and Heart Failure: - Group verbal and visual instruction to discuss disease risk factors for cardiac and pulmonary disease and treatment options.  Reviews associated critical values for Overweight/Obesity, Hypertension, Cholesterol, and Diabetes.  Discusses basics of heart failure: signs/symptoms and treatments.  Introduces Heart Failure Zone chart for action plan for heart failure. Written material provided at class time. Flowsheet Row Cardiac Rehab from 07/15/2024 in [facility] Cardiac and Pulmonary Rehab  Education need identified 07/15/24    Infection Prevention: - Provides verbal and written material to individual with discussion of infection control including proper hand washing and proper equipment cleaning during exercise session. Flowsheet Row Cardiac Rehab from 07/15/2024 in [facility] Cardiac and Pulmonary Rehab  Date 07/15/24  Educator [facility]  Instruction Review Code 1- Verbalizes Understanding    Falls Prevention: - Provides verbal and written material to individual with discussion of falls prevention and safety. Flowsheet Row Cardiac Rehab from 07/15/2024 in [facility] Cardiac and Pulmonary Rehab  Date 07/15/24  Educator [facility]  Instruction Review Code 1- Verbalizes Understanding    Other: -Provides group and verbal instruction on various topics (see comments)   Knowledge Questionnaire  Score:  Knowledge Questionnaire Score - 07/15/24 1535       Knowledge Questionnaire Score   Pre Score 22/26          Core Components/Risk Factors/Patient Goals at Admission:  Personal Goals and Risk Factors at Admission - 07/10/24 0951       Core Components/Risk Factors/Patient Goals on Admission    Weight Management Yes;Weight Maintenance    Intervention Weight Management: Develop a combined nutrition and exercise program designed to reach  desired caloric intake, while maintaining appropriate intake of nutrient and fiber, sodium and fats, and appropriate energy expenditure required for the weight goal.;Weight Management: Provide education and appropriate resources to help participant work on and attain dietary goals.    Admit Weight 125 lb (56.7 kg)    Expected Outcomes Short Term: Continue to assess and modify interventions until short term weight is achieved;Long Term: Adherence to nutrition and physical activity/exercise program aimed toward attainment of established weight goal;Weight Maintenance: Understanding of the daily nutrition guidelines, which includes 25-35% calories from fat, 7% or less cal from saturated fats, less than 200mg  cholesterol, less than 1.5gm of sodium, & 5 or more servings of fruits and vegetables daily;Understanding recommendations for meals to include 15-35% energy as protein, 25-35% energy from fat, 35-60% energy from carbohydrates, less than 200mg  of dietary cholesterol, 20-35 gm of total fiber daily;Understanding of distribution of calorie intake throughout the day with the consumption of 4-5 meals/snacks    Tobacco Cessation Yes    Intervention Assist the participant in steps to quit. Provide individualized education and counseling about committing to Tobacco Cessation, relapse prevention, and pharmacological support that can be provided by physician.;Education officer, environmental, assist with locating and accessing local/national Quit Smoking programs, and support quit date choice.    Expected Outcomes Short Term: Will demonstrate readiness to quit, by selecting a quit date.;Short Term: Will quit all tobacco product use, adhering to prevention of relapse plan.;Long Term: Complete abstinence from all tobacco products for at least 12 months from quit date.    Hypertension Yes    Intervention Provide education on lifestyle modifcations including regular physical activity/exercise, weight management, moderate  sodium restriction and increased consumption of fresh fruit, vegetables, and low fat dairy, alcohol moderation, and smoking cessation.;Monitor prescription use compliance.    Expected Outcomes Short Term: Continued assessment and intervention until BP is < 140/40mm HG in hypertensive participants. < 130/14mm HG in hypertensive participants with diabetes, heart failure or chronic kidney disease.;Long Term: Maintenance of blood pressure at goal levels.    Lipids Yes    Intervention Provide education and support for participant on nutrition & aerobic/resistive exercise along with prescribed medications to achieve LDL 70mg , HDL >40mg .    Expected Outcomes Short Term: Participant states understanding of desired cholesterol values and is compliant with medications prescribed. Participant is following exercise prescription and nutrition guidelines.;Long Term: Cholesterol controlled with medications as prescribed, with individualized exercise RX and with personalized nutrition plan. Value goals: LDL < 70mg , HDL > 40 mg.          Education:Diabetes - Individual verbal and written instruction to review signs/symptoms of diabetes, desired ranges of glucose level fasting, after meals and with exercise. Acknowledge that pre and post exercise glucose checks will be done for 3 sessions at entry of program.   Core Components/Risk Factors/Patient Goals Review:    Core Components/Risk Factors/Patient Goals at Discharge (Final Review):    ITP Comments:  ITP Comments     Row Name 07/10/24 [phone] 07/15/24 1528  ITP Comments Initial phone call completed. Diagnosis can be found in [facility] 9/15. EP Orientation scheduled for Wednesday 10/8 at 1:45. Joy Carpenter is a current tobacco user. Intervention for tobacco cessation was provided at the initial medical review. She was asked about readiness to quit and reported she is ready to quit. Patient was advised and educated about tobacco cessation using combination  therapy, tobacco cessation classes, quit line, and quit smoking apps. Patient demonstrated understanding of this material. Staff will continue to provide encouragement and follow up with the patient throughout the program. Completed and gym orientation for cardiac rehab. Initial ITP created and sent for review to Dr. Oneil Pinal, Medical Director.         Comments: Initial ITP

## 2024-07-15 NOTE — Patient Instructions (Signed)
 Patient Instructions  Patient Details  Name: Joy Carpenter MRN: [id number] Date of Birth: Oct 26, 1950 Referring Provider:  Florencio Cara BIRCH, MD  Below are your personal goals for exercise, nutrition, and risk factors. Our goal is to help you stay on track towards obtaining and maintaining these goals. We will be discussing your progress on these goals with you throughout the program.  Initial Exercise Prescription:  Initial Exercise Prescription - 07/15/24 1500       Date of Initial Exercise RX and Referring Provider   Date 07/15/24    Referring Provider Dr. Cara Florencio      Oxygen   Maintain Oxygen Saturation 88% or higher      Treadmill   MPH 2.5    Grade 0    Minutes 15    METs 2.91      NuStep   Level 3    SPM 80    Minutes 15    METs 3      REL-XR   Level 3    Watts 25    Speed 50    Minutes 15    METs 3      T5 Nustep   Level 3    SPM 80    Minutes 15    METs 3      Biostep-RELP   Level 3    SPM 80    Minutes 15    METs 3      Rower   Level 7    Watts 25    Minutes 15    METs 3      Prescription Details   Duration Progress to 30 minutes of continuous aerobic without signs/symptoms of physical distress      Intensity   THRR 40-80% of Max Heartrate 107-135    Ratings of Perceived Exertion 11-13    Perceived Dyspnea 0-4      Progression   Progression Continue to progress workloads to maintain intensity without signs/symptoms of physical distress.      Resistance Training   Training Prescription Yes    Weight 4lb    Reps 10-15          Exercise Goals: Frequency: Be able to perform aerobic exercise two to three times per week in program working toward 2-5 days per week of home exercise.  Intensity: Work with a perceived exertion of 11 (fairly light) - 15 (hard) while following your exercise prescription.  We will make changes to your prescription with you as you progress through the program.   Duration: Be able to do 30  to 45 minutes of continuous aerobic exercise in addition to a 5 minute warm-up and a 5 minute cool-down routine.   Nutrition Goals: Your personal nutrition goals will be established when you do your nutrition analysis with the dietician.  The following are general nutrition guidelines to follow: Cholesterol < 200mg /day Sodium < 1500mg /day Fiber: Women over 50 yrs - 21 grams per day  Personal Goals:  Personal Goals and Risk Factors at Admission - 07/10/24 0951       Core Components/Risk Factors/Patient Goals on Admission    Weight Management Yes;Weight Maintenance    Intervention Weight Management: Develop a combined nutrition and exercise program designed to reach desired caloric intake, while maintaining appropriate intake of nutrient and fiber, sodium and fats, and appropriate energy expenditure required for the weight goal.;Weight Management: Provide education and appropriate resources to help participant work on and attain dietary goals.    Admit Weight  125 lb (56.7 kg)    Expected Outcomes Short Term: Continue to assess and modify interventions until short term weight is achieved;Long Term: Adherence to nutrition and physical activity/exercise program aimed toward attainment of established weight goal;Weight Maintenance: Understanding of the daily nutrition guidelines, which includes 25-35% calories from fat, 7% or less cal from saturated fats, less than 200mg  cholesterol, less than 1.5gm of sodium, & 5 or more servings of fruits and vegetables daily;Understanding recommendations for meals to include 15-35% energy as protein, 25-35% energy from fat, 35-60% energy from carbohydrates, less than 200mg  of dietary cholesterol, 20-35 gm of total fiber daily;Understanding of distribution of calorie intake throughout the day with the consumption of 4-5 meals/snacks    Tobacco Cessation Yes    Intervention Assist the participant in steps to quit. Provide individualized education and counseling about  committing to Tobacco Cessation, relapse prevention, and pharmacological support that can be provided by physician.;Education officer, environmental, assist with locating and accessing local/national Quit Smoking programs, and support quit date choice.    Expected Outcomes Short Term: Will demonstrate readiness to quit, by selecting a quit date.;Short Term: Will quit all tobacco product use, adhering to prevention of relapse plan.;Long Term: Complete abstinence from all tobacco products for at least 12 months from quit date.    Hypertension Yes    Intervention Provide education on lifestyle modifcations including regular physical activity/exercise, weight management, moderate sodium restriction and increased consumption of fresh fruit, vegetables, and low fat dairy, alcohol moderation, and smoking cessation.;Monitor prescription use compliance.    Expected Outcomes Short Term: Continued assessment and intervention until BP is < 140/95mm HG in hypertensive participants. < 130/33mm HG in hypertensive participants with diabetes, heart failure or chronic kidney disease.;Long Term: Maintenance of blood pressure at goal levels.    Lipids Yes    Intervention Provide education and support for participant on nutrition & aerobic/resistive exercise along with prescribed medications to achieve LDL 70mg , HDL >40mg .    Expected Outcomes Short Term: Participant states understanding of desired cholesterol values and is compliant with medications prescribed. Participant is following exercise prescription and nutrition guidelines.;Long Term: Cholesterol controlled with medications as prescribed, with individualized exercise RX and with personalized nutrition plan. Value goals: LDL < 70mg , HDL > 40 mg.          Tobacco Use Initial Evaluation: Social History   Tobacco Use  Smoking Status Every Day   Current packs/day: 1.00   Average packs/day: 1 pack/day for 30.0 years (30.0 ttl pk-yrs)   Types: Cigarettes   Passive  exposure: Past  Smokeless Tobacco Never  Tobacco Comments   1 pack daily    Exercise Goals and Review:  Exercise Goals     Row Name 07/15/24 1533             Exercise Goals   Increase Physical Activity Yes       Intervention Develop an individualized exercise prescription for aerobic and resistive training based on initial evaluation findings, risk stratification, comorbidities and participant's personal goals.;Provide advice, education, support and counseling about physical activity/exercise needs.       Expected Outcomes Short Term: Attend rehab on a regular basis to increase amount of physical activity.;Long Term: Add in home exercise to make exercise part of routine and to increase amount of physical activity.;Long Term: Exercising regularly at least 3-5 days a week.       Increase Strength and Stamina Yes       Intervention Provide advice, education, support and counseling about  physical activity/exercise needs.;Develop an individualized exercise prescription for aerobic and resistive training based on initial evaluation findings, risk stratification, comorbidities and participant's personal goals.       Expected Outcomes Short Term: Increase workloads from initial exercise prescription for resistance, speed, and METs.;Short Term: Perform resistance training exercises routinely during rehab and add in resistance training at home;Long Term: Improve cardiorespiratory fitness, muscular endurance and strength as measured by increased METs and functional capacity ( )       Able to understand and use rate of perceived exertion (RPE) scale Yes       Intervention Provide education and explanation on how to use RPE scale       Expected Outcomes Short Term: Able to use RPE daily in rehab to express subjective intensity level;Long Term:  Able to use RPE to guide intensity level when exercising independently       Able to understand and use Dyspnea scale Yes       Intervention Provide education  and explanation on how to use Dyspnea scale       Expected Outcomes Short Term: Able to use Dyspnea scale daily in rehab to express subjective sense of shortness of breath during exertion;Long Term: Able to use Dyspnea scale to guide intensity level when exercising independently       Knowledge and understanding of Target Heart Rate Range (THRR) Yes       Intervention Provide education and explanation of THRR including how the numbers were predicted and where they are located for reference       Expected Outcomes Short Term: Able to state/look up THRR;Short Term: Able to use daily as guideline for intensity in rehab;Long Term: Able to use THRR to govern intensity when exercising independently       Able to check pulse independently Yes       Intervention Provide education and demonstration on how to check pulse in carotid and radial arteries.;Review the importance of being able to check your own pulse for safety during independent exercise       Expected Outcomes Short Term: Able to explain why pulse checking is important during independent exercise;Long Term: Able to check pulse independently and accurately       Understanding of Exercise Prescription Yes       Intervention Provide education, explanation, and written materials on patient's individual exercise prescription       Expected Outcomes Short Term: Able to explain program exercise prescription;Long Term: Able to explain home exercise prescription to exercise independently          Copy of goals given to participant.

## 2024-07-18 ENCOUNTER — Other Ambulatory Visit: Payer: Self-pay

## 2024-07-20 ENCOUNTER — Other Ambulatory Visit: Payer: Self-pay

## 2024-07-22 ENCOUNTER — Encounter

## 2024-07-23 ENCOUNTER — Encounter

## 2024-07-29 ENCOUNTER — Encounter: Payer: Self-pay | Admitting: *Deleted

## 2024-07-29 ENCOUNTER — Encounter

## 2024-07-29 DIAGNOSIS — Z955 Presence of coronary angioplasty implant and graft: Secondary | ICD-10-CM

## 2024-07-29 DIAGNOSIS — I213 ST elevation (STEMI) myocardial infarction of unspecified site: Secondary | ICD-10-CM

## 2024-07-29 NOTE — Progress Notes (Signed)
 Cardiac Individual Treatment Plan  Patient Details  Name: Joy Carpenter MRN: [id number] Date of Birth: September 28, 1951 Referring Provider:   Flowsheet Row Cardiac Rehab from 07/15/2024 in [facility] Cardiac and Pulmonary Rehab  Referring Provider Dr. Cara Lovelace    Initial Encounter Date:  Flowsheet Row Cardiac Rehab from 07/15/2024 in [facility] Cardiac and Pulmonary Rehab  Date 07/15/24    Visit Diagnosis: ST elevation myocardial infarction (STEMI), unspecified artery [facility])  Status post coronary artery stent placement  Patient's Home Medications on Admission:  Current Outpatient Medications:    ALPRAZolam  (XANAX ) 0.25 MG tablet, Take 1 tablet by mouth as needed for anxiety, Disp: 30 tablet, Rfl: 2   aspirin  81 MG chewable tablet, Chew 81 mg by mouth daily. (Patient not taking: Reported on 07/10/2024), Disp: , Rfl:    atorvastatin  (LIPITOR ) 40 MG tablet, TAKE 1 TABLET BY MOUTH EVERY DAY, Disp: 90 tablet, Rfl: 3   desipramine (NORPRAMIN) 25 MG tablet, Take 25 mg by mouth., Disp: , Rfl:    levothyroxine  (SYNTHROID ) 50 MCG tablet, TAKE 1 TABLET BY MOUTH EVERY DAY IN THE MORNING, Disp: 90 tablet, Rfl: 1   oxyCODONE  (OXY IR/ROXICODONE ) 5 MG immediate release tablet, Take 1 tablet (5 mg total) by mouth every 6 (six) hours as needed for moderate pain (pain score 4-6) or severe pain (pain score 7-10)., Disp: 30 tablet, Rfl: 0   pantoprazole  (PROTONIX ) 40 MG tablet, Take 40 mg by mouth daily., Disp: , Rfl:    prasugrel  (EFFIENT ) 10 MG TABS tablet, Take 0.5 tablets (5 mg total) by mouth daily., Disp: 30 tablet, Rfl: 2   valACYclovir  (VALTREX ) 500 MG tablet, TAKE 1 TABLET (500 MG TOTAL) BY MOUTH DAILY. (Patient taking differently: Take 500 mg by mouth daily as needed (outbreaks).), Disp: 90 tablet, Rfl: 1   zolpidem  (AMBIEN ) 10 MG tablet, TAKE 1 TABLET BY MOUTH EVERYDAY AT BEDTIME, Disp: 30 tablet, Rfl: 2  Past Medical History: Past Medical History:  Diagnosis Date   Acquired hypothyroidism     Actinic keratosis 03/28/2021   right upper arm, bx proven   Anxiety    a.) on BZO (alproazolam) PRN   Carpal tunnel syndrome 2019   Coronary artery disease involving native coronary artery    of the LAD and RCA noted on chest CT   Depression    Diastolic dysfunction    Emphysema of lung (HCC)    GERD (gastroesophageal reflux disease)    Herpes    History of hiatal hernia    Hypertensive chronic kidney disease    Insomnia    a.) on hypnotic (zolpidem ) PRN   Long-term use of aspirin  therapy    Mixed hyperlipidemia    Osteoporosis    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   Primary hypertension    Pulmonary nodules    SCC (squamous cell carcinoma) 03/28/2021   left dorsal hand, EDC   SCC (squamous cell carcinoma) 08/01/2023   SCC IS, L ant shoulder, EDC 08/27/2023   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   left anterior shoulder, EDC 08/27/23   Stress headaches (almost daily)    Thoracoabdominal aortic aneurysm    a.) s/p 4v FEVAR (CA, SMA, RRA, LRA) and proximal TEVAR extention with BILATERAL iliac limb extensions on 11/16/2020   Vertigo 2010   Vitamin D  deficiency    Weakness of both legs (intermittent)     Tobacco Use: Social History   Tobacco Use  Smoking Status Every Day   Current packs/day: 1.00   Average packs/day: 1  pack/day for 30.0 years (30.0 ttl pk-yrs)   Types: Cigarettes   Passive exposure: Past  Smokeless Tobacco Never  Tobacco Comments   1 pack daily    Labs: Review Flowsheet  More data exists      Latest Ref Rng & Units 05/26/2020 09/14/2021 07/02/2022 04/22/2023 06/22/2024  Labs for ITP Cardiac and Pulmonary Rehab  Cholestrol 0 - 200 mg/dL 799  800  821  864  861   LDL (calc) 0 - 99 mg/dL 869  872  891  73  58   HDL-C >40 mg/dL 61  54  58  53  66   Trlycerides <150 mg/dL 48  898  64  46  70   Hemoglobin A1c 4.8 - 5.6 % - - - - 5.6      Exercise Target Goals: Exercise Program Goal: Individual exercise prescription set using results from  initial 6 min walk test and THRR while considering  patient's activity barriers and safety.   Exercise Prescription Goal: Initial exercise prescription builds to 30-45 minutes a day of aerobic activity, 2-3 days per week.  Home exercise guidelines will be given to patient during program as part of exercise prescription that the participant will acknowledge.   Education: Aerobic Exercise: - Group verbal and visual presentation on the components of exercise prescription. Introduces F.I.T.T principle from ACSM for exercise prescriptions.  Reviews F.I.T.T. principles of aerobic exercise including progression. Written material provided at class time. Flowsheet Row Cardiac Rehab from 07/15/2024 in [facility] Cardiac and Pulmonary Rehab  Education need identified 07/15/24    Education: Resistance Exercise: - Group verbal and visual presentation on the components of exercise prescription. Introduces F.I.T.T principle from ACSM for exercise prescriptions  Reviews F.I.T.T. principles of resistance exercise including progression. Written material provided at class time.    Education: Exercise & Equipment Safety: - Individual verbal instruction and demonstration of equipment use and safety with use of the equipment. Flowsheet Row Cardiac Rehab from 07/15/2024 in [facility] Cardiac and Pulmonary Rehab  Date 07/15/24  Educator [facility]  Instruction Review Code 1- Verbalizes Understanding    Education: Exercise Physiology & General Exercise Guidelines: - Group verbal and written instruction with models to review the exercise physiology of the cardiovascular system and associated critical values. Provides general exercise guidelines with specific guidelines to those with heart or lung disease. Written material provided at class time.   Education: Flexibility, Balance, Mind/Body Relaxation: - Group verbal and visual presentation with interactive activity on the components of exercise prescription. Introduces F.I.T.T  principle from ACSM for exercise prescriptions. Reviews F.I.T.T. principles of flexibility and balance exercise training including progression. Also discusses the mind body connection.  Reviews various relaxation techniques to help reduce and manage stress (i.e. Deep breathing, progressive muscle relaxation, and visualization). Balance handout provided to take home. Written material provided at class time.   Activity Barriers & Risk Stratification:  Activity Barriers & Cardiac Risk Stratification - 07/15/24 1530       Activity Barriers & Cardiac Risk Stratification   Activity Barriers Back Problems    Cardiac Risk Stratification High          6 Minute Walk:  6 Minute Walk     Row Name 07/15/24 1528         6 Minute Walk   Phase Initial     Distance 1205 feet     Walk Time 6 minutes     # of Rest Breaks 0     MPH 2.3  METS 3.03     RPE 8     Perceived Dyspnea  0     VO2 Peak 10.6     Symptoms Yes (comment)     Comments Left calf cramp     Resting HR 80 bpm     Resting BP 116/64     Resting Oxygen Saturation  98 %     Exercise Oxygen Saturation  during 6 min walk 99 %     Max Ex. HR 103 bpm     Max Ex. BP 124/70     2 Minute Post BP 112/68        Oxygen Initial Assessment:   Oxygen Re-Evaluation:   Oxygen Discharge (Final Oxygen Re-Evaluation):   Initial Exercise Prescription:  Initial Exercise Prescription - 07/15/24 1500       Date of Initial Exercise RX and Referring Provider   Date 07/15/24    Referring Provider Dr. Cara Lovelace      Oxygen   Maintain Oxygen Saturation 88% or higher      Treadmill   MPH 2.5    Grade 0    Minutes 15    METs 2.91      NuStep   Level 3    SPM 80    Minutes 15    METs 3      REL-XR   Level 3    Watts 25    Speed 50    Minutes 15    METs 3      T5 Nustep   Level 3    SPM 80    Minutes 15    METs 3      Biostep-RELP   Level 3    SPM 80    Minutes 15    METs 3      Rower   Level 7     Watts 25    Minutes 15    METs 3      Prescription Details   Duration Progress to 30 minutes of continuous aerobic without signs/symptoms of physical distress      Intensity   THRR 40-80% of Max Heartrate 107-135    Ratings of Perceived Exertion 11-13    Perceived Dyspnea 0-4      Progression   Progression Continue to progress workloads to maintain intensity without signs/symptoms of physical distress.      Resistance Training   Training Prescription Yes    Weight 4lb    Reps 10-15          Perform Capillary Blood Glucose checks as needed.  Exercise Prescription Changes:   Exercise Prescription Changes     Row Name 07/15/24 1500             Response to Exercise   Blood Pressure (Admit) 116/64       Blood Pressure (Exercise) 124/70       Blood Pressure (Exit) 112/68       Heart Rate (Admit) 80 bpm       Heart Rate (Exercise) 103 bpm       Heart Rate (Exit) 76 bpm       Oxygen Saturation (Admit) 98 %       Oxygen Saturation (Exercise) 99 %       Oxygen Saturation (Exit) 99 %       Rating of Perceived Exertion (Exercise) 8       Perceived Dyspnea (Exercise) 0       Symptoms left calf cramp  Comments results          Exercise Comments:   Exercise Goals and Review:   Exercise Goals     Row Name 07/15/24 1533             Exercise Goals   Increase Physical Activity Yes       Intervention Develop an individualized exercise prescription for aerobic and resistive training based on initial evaluation findings, risk stratification, comorbidities and participant's personal goals.;Provide advice, education, support and counseling about physical activity/exercise needs.       Expected Outcomes Short Term: Attend rehab on a regular basis to increase amount of physical activity.;Long Term: Add in home exercise to make exercise part of routine and to increase amount of physical activity.;Long Term: Exercising regularly at least 3-5 days a week.        Increase Strength and Stamina Yes       Intervention Provide advice, education, support and counseling about physical activity/exercise needs.;Develop an individualized exercise prescription for aerobic and resistive training based on initial evaluation findings, risk stratification, comorbidities and participant's personal goals.       Expected Outcomes Short Term: Increase workloads from initial exercise prescription for resistance, speed, and METs.;Short Term: Perform resistance training exercises routinely during rehab and add in resistance training at home;Long Term: Improve cardiorespiratory fitness, muscular endurance and strength as measured by increased METs and functional capacity ( )       Able to understand and use rate of perceived exertion (RPE) scale Yes       Intervention Provide education and explanation on how to use RPE scale       Expected Outcomes Short Term: Able to use RPE daily in rehab to express subjective intensity level;Long Term:  Able to use RPE to guide intensity level when exercising independently       Able to understand and use Dyspnea scale Yes       Intervention Provide education and explanation on how to use Dyspnea scale       Expected Outcomes Short Term: Able to use Dyspnea scale daily in rehab to express subjective sense of shortness of breath during exertion;Long Term: Able to use Dyspnea scale to guide intensity level when exercising independently       Knowledge and understanding of Target Heart Rate Range (THRR) Yes       Intervention Provide education and explanation of THRR including how the numbers were predicted and where they are located for reference       Expected Outcomes Short Term: Able to state/look up THRR;Short Term: Able to use daily as guideline for intensity in rehab;Long Term: Able to use THRR to govern intensity when exercising independently       Able to check pulse independently Yes       Intervention Provide education and demonstration  on how to check pulse in carotid and radial arteries.;Review the importance of being able to check your own pulse for safety during independent exercise       Expected Outcomes Short Term: Able to explain why pulse checking is important during independent exercise;Long Term: Able to check pulse independently and accurately       Understanding of Exercise Prescription Yes       Intervention Provide education, explanation, and written materials on patient's individual exercise prescription       Expected Outcomes Short Term: Able to explain program exercise prescription;Long Term: Able to explain home exercise prescription to exercise independently  Exercise Goals Re-Evaluation :   Discharge Exercise Prescription (Final Exercise Prescription Changes):  Exercise Prescription Changes - 07/15/24 1500       Response to Exercise   Blood Pressure (Admit) 116/64    Blood Pressure (Exercise) 124/70    Blood Pressure (Exit) 112/68    Heart Rate (Admit) 80 bpm    Heart Rate (Exercise) 103 bpm    Heart Rate (Exit) 76 bpm    Oxygen Saturation (Admit) 98 %    Oxygen Saturation (Exercise) 99 %    Oxygen Saturation (Exit) 99 %    Rating of Perceived Exertion (Exercise) 8    Perceived Dyspnea (Exercise) 0    Symptoms left calf cramp    Comments results          Nutrition:  Target Goals: Understanding of nutrition guidelines, daily intake of sodium 1500mg , cholesterol 200mg , calories 30% from fat and 7% or less from saturated fats, daily to have 5 or more servings of fruits and vegetables.  Education: Nutrition 1 -Group instruction provided by verbal, written material, interactive activities, discussions, models, and posters to present general guidelines for heart healthy nutrition including macronutrients, label reading, and promoting whole foods over processed counterparts. Education serves as Pensions consultant of discussion of heart healthy eating for all. Written material provided at  class time.    Education: Nutrition 2 -Group instruction provided by verbal, written material, interactive activities, discussions, models, and posters to present general guidelines for heart healthy nutrition including sodium, cholesterol, and saturated fat. Providing guidance of habit forming to improve blood pressure, cholesterol, and body weight. Written material provided at class time.     Biometrics:  Pre Biometrics - 07/15/24 1533       Pre Biometrics   Height 5' 6.3 (1.684 m)    Weight 128 lb 6.4 oz (58.2 kg)    Waist Circumference 31 inches    Hip Circumference 38 inches    Waist to Hip Ratio 0.82 %    BMI (Calculated) 20.54    Single Leg Stand 30 seconds           Nutrition Therapy Plan and Nutrition Goals:   Nutrition Assessments:  MEDIFICTS Score Key: >=70 Need to make dietary changes  40-70 Heart Healthy Diet <= 40 Therapeutic Level Cholesterol Diet  Flowsheet Row Cardiac Rehab from 07/15/2024 in [facility] Cardiac and Pulmonary Rehab  Picture Your Plate Total Score on Admission 57   Picture Your Plate Scores: <59 Unhealthy dietary pattern with much room for improvement. 41-50 Dietary pattern unlikely to meet recommendations for good health and room for improvement. 51-60 More healthful dietary pattern, with some room for improvement.  >60 Healthy dietary pattern, although there may be some specific behaviors that could be improved.    Nutrition Goals Re-Evaluation:   Nutrition Goals Discharge (Final Nutrition Goals Re-Evaluation):   Psychosocial: Target Goals: Acknowledge presence or absence of significant depression and/or stress, maximize coping skills, provide positive support system. Participant is able to verbalize types and ability to use techniques and skills needed for reducing stress and depression.   Education: Stress, Anxiety, and Depression - Group verbal and visual presentation to define topics covered.  Reviews how body is impacted by  stress, anxiety, and depression.  Also discusses healthy ways to reduce stress and to treat/manage anxiety and depression. Written material provided at class time.   Education: Sleep Hygiene -Provides group verbal and written instruction about how sleep can affect your health.  Define sleep hygiene, discuss sleep cycles and  impact of sleep habits. Review good sleep hygiene tips.   Initial Review & Psychosocial Screening:  Initial Psych Review & Screening - 07/10/24 0946       Initial Review   Current issues with Current Sleep Concerns;Current Anxiety/Panic;Current Psychotropic Meds      Family Dynamics   Good Support System? Yes      Screening Interventions   Interventions Encouraged to exercise    Expected Outcomes Short Term goal: Utilizing psychosocial counselor, staff and physician to assist with identification of specific Stressors or current issues interfering with healing process. Setting desired goal for each stressor or current issue identified.;Long Term Goal: Stressors or current issues are controlled or eliminated.;Short Term goal: Identification and review with participant of any Quality of Life or Depression concerns found by scoring the questionnaire.;Long Term goal: The participant improves quality of Life and PHQ9 Scores as seen by post scores and/or verbalization of changes          Quality of Life Scores:   Quality of Life - 07/15/24 1534       Quality of Life   Select Quality of Life      Quality of Life Scores   Health/Function Pre 24.6 %    Socioeconomic Pre 26.25 %    Psych/Spiritual Pre 28.79 %    Family Pre 19.8 %    GLOBAL Pre 25.13 %         Scores of 19 and below usually indicate a poorer quality of life in these areas.  A difference of  2-3 points is a clinically meaningful difference.  A difference of 2-3 points in the total score of the Quality of Life Index has been associated with significant improvement in overall quality of life,  self-image, physical symptoms, and general health in studies assessing change in quality of life.  PHQ-9: Review Flowsheet  More data exists      07/15/2024 08/01/2023 04/22/2023 12/18/2021 06/19/2021  Depression screen PHQ 2/9  Decreased Interest 0 0 0 0 0  Down, Depressed, Hopeless 0 0 0 0 0  PHQ - 2 Score 0 0 0 0 0  Altered sleeping 1 - - - -  Tired, decreased energy 1 - - - -  Change in appetite 0 - - - -  Feeling bad or failure about yourself  0 - - - -  Trouble concentrating 0 - - - -  Moving slowly or fidgety/restless 0 - - - -  Suicidal thoughts 0 - - - -  PHQ-9 Score 2 - - - -  Difficult doing work/chores Not difficult at all - - - -   Interpretation of Total Score  Total Score Depression Severity:  1-4 = Minimal depression, 5-9 = Mild depression, 10-14 = Moderate depression, 15-19 = Moderately severe depression, 20-27 = Severe depression   Psychosocial Evaluation and Intervention:  Psychosocial Evaluation - 07/10/24 1000       Psychosocial Evaluation & Interventions   Interventions Encouraged to exercise with the program and follow exercise prescription;Stress management education;Relaxation education    Comments Khamari is coming to cardiac rehab post STEMI w/ stent. Her wrist is still very bruised and painful, so she is following up weekly with her cardiologist. She mentions it is causing sleep concerns on top of her already problems with staying asleep. She is trying to get in touch with one of her doctors who prescribed some sleep medicine so she can get a refill. She is a current smoker and is interested in quitting.  She mentions in December she lost her fiance to lung and brain cancer, so her stress level has been up and smoking is a comfort to her. She has returned to her job at [employer] and is ready to get started in the program to learn more about heart healthy living.    Expected Outcomes Short: attend cardiac rehab for education and exercise Long: develop and maintain  positive self care habits    Continue Psychosocial Services  Follow up required by staff          Psychosocial Re-Evaluation:   Psychosocial Discharge (Final Psychosocial Re-Evaluation):   Vocational Rehabilitation: Provide vocational rehab assistance to qualifying candidates.   Vocational Rehab Evaluation & Intervention:  Vocational Rehab - 07/10/24 0953       Initial Vocational Rehab Evaluation & Intervention   Assessment shows need for Vocational Rehabilitation No          Education: Education Goals: Education classes will be provided on a variety of topics geared toward better understanding of heart health and risk factor modification. Participant will state understanding/return demonstration of topics presented as noted by education test scores.  Learning Barriers/Preferences:  Learning Barriers/Preferences - 07/10/24 9047       Learning Barriers/Preferences   Learning Barriers None    Learning Preferences None          General Cardiac Education Topics:  AED/CPR: - Group verbal and written instruction with the use of models to demonstrate the basic use of the AED with the basic ABC's of resuscitation.   Test and Procedures: - Group verbal and visual presentation and models provide information about basic cardiac anatomy and function. Reviews the testing methods done to diagnose heart disease and the outcomes of the test results. Describes the treatment choices: Medical Management, Angioplasty, or Coronary Bypass Surgery for treating various heart conditions including Myocardial Infarction, Angina, Valve Disease, and Cardiac Arrhythmias. Written material provided at class time.   Medication Safety: - Group verbal and visual instruction to review commonly prescribed medications for heart and lung disease. Reviews the medication, class of the drug, and side effects. Includes the steps to properly store meds and maintain the prescription regimen. Written material  provided at class time.   Intimacy: - Group verbal instruction through game format to discuss how heart and lung disease can affect sexual intimacy. Written material provided at class time.   Know Your Numbers and Heart Failure: - Group verbal and visual instruction to discuss disease risk factors for cardiac and pulmonary disease and treatment options.  Reviews associated critical values for Overweight/Obesity, Hypertension, Cholesterol, and Diabetes.  Discusses basics of heart failure: signs/symptoms and treatments.  Introduces Heart Failure Zone chart for action plan for heart failure. Written material provided at class time. Flowsheet Row Cardiac Rehab from 07/15/2024 in [facility] Cardiac and Pulmonary Rehab  Education need identified 07/15/24    Infection Prevention: - Provides verbal and written material to individual with discussion of infection control including proper hand washing and proper equipment cleaning during exercise session. Flowsheet Row Cardiac Rehab from 07/15/2024 in [facility] Cardiac and Pulmonary Rehab  Date 07/15/24  Educator [facility]  Instruction Review Code 1- Verbalizes Understanding    Falls Prevention: - Provides verbal and written material to individual with discussion of falls prevention and safety. Flowsheet Row Cardiac Rehab from 07/15/2024 in [facility] Cardiac and Pulmonary Rehab  Date 07/15/24  Educator [facility]  Instruction Review Code 1- Verbalizes Understanding    Other: -Provides group and verbal instruction on various  topics (see comments)   Knowledge Questionnaire Score:  Knowledge Questionnaire Score - 07/15/24 1535       Knowledge Questionnaire Score   Pre Score 22/26          Core Components/Risk Factors/Patient Goals at Admission:  Personal Goals and Risk Factors at Admission - 07/10/24 0951       Core Components/Risk Factors/Patient Goals on Admission    Weight Management Yes;Weight Maintenance    Intervention Weight Management: Develop a combined  nutrition and exercise program designed to reach desired caloric intake, while maintaining appropriate intake of nutrient and fiber, sodium and fats, and appropriate energy expenditure required for the weight goal.;Weight Management: Provide education and appropriate resources to help participant work on and attain dietary goals.    Admit Weight 125 lb (56.7 kg)    Expected Outcomes Short Term: Continue to assess and modify interventions until short term weight is achieved;Long Term: Adherence to nutrition and physical activity/exercise program aimed toward attainment of established weight goal;Weight Maintenance: Understanding of the daily nutrition guidelines, which includes 25-35% calories from fat, 7% or less cal from saturated fats, less than 200mg  cholesterol, less than 1.5gm of sodium, & 5 or more servings of fruits and vegetables daily;Understanding recommendations for meals to include 15-35% energy as protein, 25-35% energy from fat, 35-60% energy from carbohydrates, less than 200mg  of dietary cholesterol, 20-35 gm of total fiber daily;Understanding of distribution of calorie intake throughout the day with the consumption of 4-5 meals/snacks    Tobacco Cessation Yes    Intervention Assist the participant in steps to quit. Provide individualized education and counseling about committing to Tobacco Cessation, relapse prevention, and pharmacological support that can be provided by physician.;Education officer, environmental, assist with locating and accessing local/national Quit Smoking programs, and support quit date choice.    Expected Outcomes Short Term: Will demonstrate readiness to quit, by selecting a quit date.;Short Term: Will quit all tobacco product use, adhering to prevention of relapse plan.;Long Term: Complete abstinence from all tobacco products for at least 12 months from quit date.    Hypertension Yes    Intervention Provide education on lifestyle modifcations including regular physical  activity/exercise, weight management, moderate sodium restriction and increased consumption of fresh fruit, vegetables, and low fat dairy, alcohol moderation, and smoking cessation.;Monitor prescription use compliance.    Expected Outcomes Short Term: Continued assessment and intervention until BP is < 140/19mm HG in hypertensive participants. < 130/64mm HG in hypertensive participants with diabetes, heart failure or chronic kidney disease.;Long Term: Maintenance of blood pressure at goal levels.    Lipids Yes    Intervention Provide education and support for participant on nutrition & aerobic/resistive exercise along with prescribed medications to achieve LDL 70mg , HDL >40mg .    Expected Outcomes Short Term: Participant states understanding of desired cholesterol values and is compliant with medications prescribed. Participant is following exercise prescription and nutrition guidelines.;Long Term: Cholesterol controlled with medications as prescribed, with individualized exercise RX and with personalized nutrition plan. Value goals: LDL < 70mg , HDL > 40 mg.          Education:Diabetes - Individual verbal and written instruction to review signs/symptoms of diabetes, desired ranges of glucose level fasting, after meals and with exercise. Acknowledge that pre and post exercise glucose checks will be done for 3 sessions at entry of program.   Core Components/Risk Factors/Patient Goals Review:    Core Components/Risk Factors/Patient Goals at Discharge (Final Review):    ITP Comments:  ITP Comments  Row Name 07/10/24 9056 07/15/24 1528 07/29/24 1050       ITP Comments Initial phone call completed. Diagnosis can be found in [facility] 9/15. EP Orientation scheduled for Wednesday 10/8 at 1:45. Marysa is a current tobacco user. Intervention for tobacco cessation was provided at the initial medical review. She was asked about readiness to quit and reported she is ready to quit. Patient was advised and  educated about tobacco cessation using combination therapy, tobacco cessation classes, quit line, and quit smoking apps. Patient demonstrated understanding of this material. Staff will continue to provide encouragement and follow up with the patient throughout the program. Completed and gym orientation for cardiac rehab. Initial ITP created and sent for review to Dr. Oneil Pinal, Medical Director. 30 Day review completed. Medical Director ITP review done, changes made as directed, and signed approval by Medical Director.        Comments: 30 day review

## 2024-07-30 ENCOUNTER — Other Ambulatory Visit: Payer: Self-pay | Admitting: Physician Assistant

## 2024-07-30 ENCOUNTER — Encounter

## 2024-07-30 DIAGNOSIS — F411 Generalized anxiety disorder: Secondary | ICD-10-CM

## 2024-08-03 ENCOUNTER — Ambulatory Visit
Admission: RE | Admit: 2024-08-03 | Discharge: 2024-08-03 | Disposition: A | Discharge status: Home / Self Care | Source: Ambulatory Visit | Attending: Gastroenterology | Admitting: Gastroenterology

## 2024-08-03 DIAGNOSIS — K8689 Other specified diseases of pancreas: Secondary | ICD-10-CM | POA: Diagnosis not present

## 2024-08-03 DIAGNOSIS — K861 Other chronic pancreatitis: Secondary | ICD-10-CM | POA: Diagnosis not present

## 2024-08-03 DIAGNOSIS — K859 Acute pancreatitis without necrosis or infection, unspecified: Secondary | ICD-10-CM | POA: Diagnosis not present

## 2024-08-03 DIAGNOSIS — R935 Abnormal findings on diagnostic imaging of other abdominal regions, including retroperitoneum: Secondary | ICD-10-CM | POA: Diagnosis not present

## 2024-08-03 DIAGNOSIS — I714 Abdominal aortic aneurysm, without rupture, unspecified: Secondary | ICD-10-CM | POA: Diagnosis not present

## 2024-08-03 MED ORDER — IOHEXOL 300 MG/ML  SOLN
100.0000 mL | Freq: Once | INTRAMUSCULAR | Status: AC | PRN
  Administered 2024-08-03: 100 mL via INTRAVENOUS

## 2024-08-05 ENCOUNTER — Encounter

## 2024-08-05 DIAGNOSIS — Z955 Presence of coronary angioplasty implant and graft: Secondary | ICD-10-CM | POA: Diagnosis not present

## 2024-08-05 DIAGNOSIS — F17210 Nicotine dependence, cigarettes, uncomplicated: Secondary | ICD-10-CM | POA: Diagnosis not present

## 2024-08-05 DIAGNOSIS — I252 Old myocardial infarction: Secondary | ICD-10-CM | POA: Diagnosis not present

## 2024-08-05 DIAGNOSIS — I1310 Hypertensive heart and chronic kidney disease without heart failure, with stage 1 through stage 4 chronic kidney disease, or unspecified chronic kidney disease: Secondary | ICD-10-CM | POA: Diagnosis not present

## 2024-08-05 DIAGNOSIS — Z48812 Encounter for surgical aftercare following surgery on the circulatory system: Secondary | ICD-10-CM | POA: Diagnosis not present

## 2024-08-05 DIAGNOSIS — I213 ST elevation (STEMI) myocardial infarction of unspecified site: Secondary | ICD-10-CM

## 2024-08-05 DIAGNOSIS — N189 Chronic kidney disease, unspecified: Secondary | ICD-10-CM | POA: Diagnosis not present

## 2024-08-05 NOTE — Progress Notes (Signed)
 Daily Session Note  Patient Details  Name: Joy Carpenter MRN: [id number] Date of Birth: 1951/08/19 Referring Provider:   Flowsheet Row Cardiac Rehab from 07/15/2024 in [facility] Cardiac and Pulmonary Rehab  Referring Provider Dr. Cara Lovelace    Encounter Date: 08/05/2024  Check In:  Session Check In - 08/05/24 1438       Check-In   Supervising physician immediately available to respond to emergencies See telemetry face sheet for immediately available ER MD    Location [facility]-Cardiac & Pulmonary Rehab    Staff Present Burnard Davenport RN,BSN,MPA;Laura Cates RN,BSN;Jashae Wiggs Dyane BS, ACSM CEP, Exercise Physiologist;Noah Tickle, BS, Exercise Physiologist    Virtual Visit No    Medication changes reported     No    Fall or balance concerns reported    No    Tobacco Cessation No Change    Warm-up and Cool-down Performed on first and last piece of equipment    Resistance Training Performed Yes    VAD Patient? No    PAD/SET Patient? No      Pain Assessment   Currently in Pain? No/denies             Social History   Tobacco Use  Smoking Status Every Day   Current packs/day: 1.00   Average packs/day: 1 pack/day for 30.0 years (30.0 ttl pk-yrs)   Types: Cigarettes   Passive exposure: Past  Smokeless Tobacco Never  Tobacco Comments   1 pack daily    Goals Met:  Independence with exercise equipment Exercise tolerated well No report of concerns or symptoms today Strength training completed today  Goals Unmet:  Not Applicable  Comments: First full day of exercise!  Patient was oriented to gym and equipment including functions, settings, policies, and procedures.  Patient's individual exercise prescription and treatment plan were reviewed.  All starting workloads were established based on the results of the 6 minute walk test done at initial orientation visit.  The plan for exercise progression was also introduced and progression will be customized based on patient's  performance and goals.    Dr. Oneil Pinal is Medical Director for [facility] Cardiac Rehabilitation.  Dr. Fuad Aleskerov is Medical Director for [facility] Pulmonary Rehabilitation.

## 2024-08-06 ENCOUNTER — Encounter: Payer: Self-pay | Admitting: Physician Assistant

## 2024-08-06 ENCOUNTER — Encounter

## 2024-08-06 ENCOUNTER — Ambulatory Visit (INDEPENDENT_AMBULATORY_CARE_PROVIDER_SITE_OTHER): Payer: Medicare HMO | Admitting: Physician Assistant

## 2024-08-06 VITALS — BP 110/70 | HR 78 | Temp 98.0°F | Resp 16 | Ht 66.0 in | Wt 128.2 lb

## 2024-08-06 DIAGNOSIS — K861 Other chronic pancreatitis: Secondary | ICD-10-CM | POA: Diagnosis not present

## 2024-08-06 DIAGNOSIS — I252 Old myocardial infarction: Secondary | ICD-10-CM | POA: Diagnosis not present

## 2024-08-06 DIAGNOSIS — E039 Hypothyroidism, unspecified: Secondary | ICD-10-CM | POA: Diagnosis not present

## 2024-08-06 DIAGNOSIS — Z0001 Encounter for general adult medical examination with abnormal findings: Secondary | ICD-10-CM | POA: Diagnosis not present

## 2024-08-06 DIAGNOSIS — E538 Deficiency of other specified B group vitamins: Secondary | ICD-10-CM

## 2024-08-06 DIAGNOSIS — E782 Mixed hyperlipidemia: Secondary | ICD-10-CM | POA: Diagnosis not present

## 2024-08-06 DIAGNOSIS — R300 Dysuria: Secondary | ICD-10-CM

## 2024-08-06 DIAGNOSIS — R1012 Left upper quadrant pain: Secondary | ICD-10-CM

## 2024-08-06 DIAGNOSIS — Z1231 Encounter for screening mammogram for malignant neoplasm of breast: Secondary | ICD-10-CM

## 2024-08-06 MED ORDER — TRAMADOL HCL 50 MG PO TABS: ORAL_TABLET | ORAL | 0 refills | Status: DC

## 2024-08-06 NOTE — Progress Notes (Signed)
 [facility] [address] [place], [place] [address]  Internal MEDICINE  Office Visit Note  Patient Name: Joy Carpenter  [id number]  [id number]  Date of Service: 08/06/2024  Chief Complaint  Patient presents with   Medicare Wellness   Gastroesophageal Reflux   Hyperlipidemia   Hypertension    HPI Joy Carpenter presents for an annual well visit  Well-appearing 73 y.o.female Routine CRC screening: UTD Routine mammogram: DUE Labs: ordered Other concerns: seeing GI due to concerns of chronic pancreatitis, now showing calcifications and stones in the pancreatic body and dilation of main pancreatic duct. She was advised by [facility] GI office to follow up with original [place] [facility] provider  -still having left side abdominal pain that may be related to chronic pancreatitis findings. Desipramine was prescribed but has not helped pain therefore she has requested tramadol  refills. She will follow up with GI further -Started cardiac rehab yesterday -a little sore throat and congestion started recently, but no SOB and will monitor     08/06/2024    1:22 PM 08/01/2023    1:25 PM 06/21/2022   10:00 AM  MMSE - Mini Mental State Exam  Orientation to time 5 5 5   Orientation to Place 5 5 5   Registration 3 3 3   Attention/ Calculation 5 5 5   Recall 3 3 3   Language- name 2 objects 2 2 2   Language- repeat 1 1 1   Language- follow 3 step command 3 3 3   Language- read & follow direction 1 1 1   Write a sentence 1 1 1   Copy design 1 1 1   Total score 30 30 30     Functional Status Survey: Is the patient deaf or have difficulty hearing?: No Does the patient have difficulty seeing, even when wearing glasses/contacts?: No Does the patient have difficulty concentrating, remembering, or making decisions?: No Does the patient have difficulty walking or climbing stairs?: No Does the patient have difficulty dressing or bathing?: No Does the patient have difficulty doing errands alone such  as visiting a doctor's office or shopping?: No     08/01/2023    1:24 PM 10/31/2023   10:13 AM 04/30/2024    9:16 AM 07/10/2024    9:39 AM 08/06/2024    1:22 PM  Fall Risk  Falls in the past year? 0 0 0 0 0  Was there an injury with Fall?    0   Fall Risk Category Calculator    0        07/15/2024    3:35 PM  Depression screen PHQ 2/9  Decreased Interest 0  Down, Depressed, Hopeless 0  PHQ - 2 Score 0  Altered sleeping 1  Tired, decreased energy 1  Change in appetite 0  Feeling bad or failure about yourself  0  Trouble concentrating 0  Moving slowly or fidgety/restless 0  Suicidal thoughts 0  PHQ-9 Score 2   Difficult doing work/chores Not difficult at all     Data saved with a previous flowsheet row definition        No data to display            Current Medication: Outpatient Encounter Medications as of 08/06/2024  Medication Sig   ALPRAZolam  (XANAX ) 0.25 MG tablet TAKE 1 TABLET BY MOUTH AS NEEDED FOR ANXIETY   aspirin  81 MG chewable tablet Chew 81 mg by mouth daily.   atorvastatin  (LIPITOR ) 40 MG tablet TAKE 1 TABLET BY MOUTH EVERY DAY   desipramine (NORPRAMIN) 25 MG tablet  Take 25 mg by mouth.   levothyroxine  (SYNTHROID ) 50 MCG tablet TAKE 1 TABLET BY MOUTH EVERY DAY IN THE MORNING   prasugrel  (EFFIENT ) 10 MG TABS tablet Take 0.5 tablets (5 mg total) by mouth daily.   traMADol  (ULTRAM ) 50 MG tablet Take 1/2-1 tab daily as needed for severe pain   valACYclovir  (VALTREX ) 500 MG tablet TAKE 1 TABLET (500 MG TOTAL) BY MOUTH DAILY.   zolpidem  (AMBIEN ) 10 MG tablet TAKE 1 TABLET BY MOUTH EVERYDAY AT BEDTIME   [DISCONTINUED] oxyCODONE  (OXY IR/ROXICODONE ) 5 MG immediate release tablet Take 1 tablet (5 mg total) by mouth every 6 (six) hours as needed for moderate pain (pain score 4-6) or severe pain (pain score 7-10).   [DISCONTINUED] pantoprazole  (PROTONIX ) 40 MG tablet Take 40 mg by mouth daily.   No facility-administered encounter medications on file as of  08/06/2024.    Surgical History: Past Surgical History:  Procedure Laterality Date   COLONOSCOPY     COLONOSCOPY WITH PROPOFOL  N/A 06/14/2016   Procedure: COLONOSCOPY WITH PROPOFOL ;  Surgeon: Rogelia Copping, MD;  Location: [facility];  Service: Endoscopy;  Laterality: N/A;   CORONARY/GRAFT ACUTE MI REVASCULARIZATION N/A 06/22/2024   Procedure: Coronary/Graft Acute MI Revascularization;  Surgeon: Florencio Cara BIRCH, MD;  Location: [facility] INVASIVE CV LAB;  Service: Cardiovascular;  Laterality: N/A;   ESOPHAGOGASTRODUODENOSCOPY (EGD) WITH PROPOFOL  N/A 06/14/2016   Procedure: ESOPHAGOGASTRODUODENOSCOPY (EGD) WITH PROPOFOL ;  Surgeon: Rogelia Copping, MD;  Location: [facility];  Service: Endoscopy;  Laterality: N/A;   ESOPHAGOGASTRODUODENOSCOPY (EGD) WITH PROPOFOL  N/A 11/27/2021   Procedure: ESOPHAGOGASTRODUODENOSCOPY (EGD) WITH BIOPSY;  Surgeon: Copping Rogelia, MD;  Location: [facility];  Service: Endoscopy;  Laterality: N/A;   HYSTEROSCOPY WITH D & C N/A 07/18/2021   Procedure: DILATATION AND CURETTAGE /HYSTEROSCOPY;  Surgeon: Lake Read, MD;  Location: [facility] ORS;  Service: Gynecology;  Laterality: N/A;   INSERTION OF MESH  01/16/2024   Procedure: INSERTION OF MESH;  Surgeon: Jordis Laneta FALCON, MD;  Location: [facility] ORS;  Service: General;;   LEFT HEART CATH AND CORONARY ANGIOGRAPHY N/A 06/22/2024   Procedure: LEFT HEART CATH AND CORONARY ANGIOGRAPHY;  Surgeon: Florencio Cara BIRCH, MD;  Location: [facility] INVASIVE CV LAB;  Service: Cardiovascular;  Laterality: N/A;   THORACIC AORTIC ANEURYSM REPAIR N/A 11/16/2020   Procedure: 4V FEVAR (CA, SMA, RRA, LRA) WITH PROXIMAL TEVAR EXTENSION WITH BILATERAL ILIAC LIMB EXTENSIONS; Location: [facility]; Surgeon; Oneil Phlegm, MD   TUBAL LIGATION     XI ROBOTIC ASSISTED PARAESOPHAGEAL HERNIA REPAIR N/A 01/16/2024   Procedure: REPAIR, HERNIA, PARAESOPHAGEAL, ROBOT-ASSISTED;  Surgeon: Jordis Laneta FALCON, MD;  Location: [facility] OR[facility];  Service: General;  Laterality:  N/A;    Medical History: Past Medical History:  Diagnosis Date   Acquired hypothyroidism    Actinic keratosis 03/28/2021   right upper arm, bx proven   Anxiety    a.) on BZO (alproazolam) PRN   Carpal tunnel syndrome 2019   Coronary artery disease involving native coronary artery    of the LAD and RCA noted on chest CT   Depression    Diastolic dysfunction    Emphysema of lung (HCC)    GERD (gastroesophageal reflux disease)    Herpes    History of hiatal hernia    Hypertensive chronic kidney disease    Insomnia    a.) on hypnotic (zolpidem ) PRN   Long-term use of aspirin  therapy    Mixed hyperlipidemia    Osteoporosis    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   Primary hypertension  Pulmonary nodules    SCC (squamous cell carcinoma) 03/28/2021   left dorsal hand, EDC   SCC (squamous cell carcinoma) 08/01/2023   SCC IS, L ant shoulder, EDC 08/27/2023   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   left anterior shoulder, EDC 08/27/23   Stress headaches (almost daily)    Thoracoabdominal aortic aneurysm    a.) s/p 4v FEVAR (CA, SMA, RRA, LRA) and proximal TEVAR extention with BILATERAL iliac limb extensions on 11/16/2020   Vertigo 2010   Vitamin D  deficiency    Weakness of both legs (intermittent)     Family History: Family History  Problem Relation Age of Onset   Leukemia Mother    Aneurysm Other     Social History   Socioeconomic History   Marital status: Divorced    Spouse name: Not on file   Number of children: 2   Years of education: Not on file   Highest education level: Not on file  Occupational History   Not on file  Tobacco Use   Smoking status: Every Day    Current packs/day: 1.00    Average packs/day: 1 pack/day for 30.0 years (30.0 ttl pk-yrs)    Types: Cigarettes    Passive exposure: Past   Smokeless tobacco: Never   Tobacco comments:    1 pack daily  Vaping Use   Vaping status: Never Used  Substance and Sexual Activity   Alcohol use:  Not Currently   Drug use: Not Currently   Sexual activity: Not Currently    Birth control/protection: Post-menopausal  Other Topics Concern   Not on file  Social History Narrative   Lives in [place] with grand-daughter, currently staying with boyfriend   Social Drivers of Corporate Investment Banker Strain: Low Risk  (06/30/2024)   Received from [facility]   Overall Financial Resource Strain (CARDIA)    Difficulty of Paying Living Expenses: Not hard at all  Food Insecurity: No Food Insecurity (06/30/2024)   Received from [facility]   Hunger Vital Sign    Within the past 12 months, you worried that your food would run out before you got the money to buy more.: Never true    Within the past 12 months, the food you bought just didn't last and you didn't have money to get more.: Never true  Transportation Needs: No Transportation Needs (06/30/2024)   Received from [facility] - Transportation    In the past 12 months, has lack of transportation kept you from medical appointments or from getting medications?: No    Lack of Transportation (Non-Medical): No  Physical Activity: Not on file  Stress: Not on file  Social Connections: Unknown (06/22/2024)   Social Connection and Isolation Panel    Frequency of Communication with Friends and Family: More than three times a week    Frequency of Social Gatherings with Friends and Family: Three times a week    Attends Religious Services: Not on file    Active Member of Clubs or Organizations: Not on file    Attends Club or Organization Meetings: Not on file    Marital Status: Not on file  Intimate Partner Violence: Not At Risk (06/22/2024)   Humiliation, Afraid, Rape, and Kick questionnaire    Fear of Current or Ex-Partner: No    Emotionally Abused: No    Physically Abused: No    Sexually Abused: No      Review of Systems  Constitutional:  Negative for chills, diaphoresis  and fatigue.  HENT:  Positive for congestion and sore throat. Negative for ear pain, postnasal drip and sinus pressure.   Eyes:  Negative for photophobia, discharge, redness, itching and visual disturbance.  Respiratory:  Negative for cough, shortness of breath and wheezing.   Cardiovascular:  Negative for palpitations and leg swelling.  Gastrointestinal:  Positive for abdominal pain. Negative for constipation, diarrhea, nausea and vomiting.  Genitourinary:  Negative for dysuria and flank pain.  Musculoskeletal:  Negative for arthralgias, back pain, gait problem and neck pain.  Skin:  Negative for color change.  Allergic/Immunologic: Negative for environmental allergies and food allergies.  Neurological:  Negative for dizziness and headaches.  Hematological:  Does not bruise/bleed easily.  Psychiatric/Behavioral:  Negative for agitation, behavioral problems (depression) and hallucinations. The patient is nervous/anxious.     Vital Signs: BP 110/70   Pulse 78   Temp 98 F (36.7 C)   Resp 16   Ht 5' 6 (1.676 m)   Wt 128 lb 3.2 oz (58.2 kg)   SpO2 98%   BMI 20.69 kg/m    Physical Exam Vitals and nursing note reviewed.  Constitutional:      General: She is not in acute distress.    Appearance: She is well-developed. She is not diaphoretic.  HENT:     Head: Normocephalic and atraumatic.  Eyes:     Extraocular Movements: Extraocular movements intact.  Neck:     Thyroid : No thyromegaly.     Vascular: No JVD.     Trachea: No tracheal deviation.  Cardiovascular:     Rate and Rhythm: Normal rate and regular rhythm.     Heart sounds: Normal heart sounds. No murmur heard.    No friction rub. No gallop.  Pulmonary:     Effort: Pulmonary effort is normal. No respiratory distress.     Breath sounds: No wheezing or rales.  Chest:     Chest wall: No tenderness.  Skin:    General: Skin is warm and dry.  Neurological:     Mental Status: She is alert and oriented to person, place,  and time.  Psychiatric:        Behavior: Behavior normal.        Thought Content: Thought content normal.        Judgment: Judgment normal.        Assessment/Plan: 1. Encounter for Medicare annual examination with abnormal findings (Primary) AWV performed, labs ordered, mammogram ordered  2. History of ST elevation myocardial infarction (STEMI) Followed by cardiology with cardiac rehab now  3. Chronic pancreatitis, unspecified pancreatitis type (HCC) Will follow back up with [facility] for further management  4. LUQ pain Given tramadol  refill for continued pain, however advised to follow up with GI to address further - traMADol  (ULTRAM ) 50 MG tablet; Take 1/2-1 tab daily as needed for severe pain  Dispense: 30 tablet; Refill: 0  5. Visit for screening mammogram - MM 3D SCREENING MAMMOGRAM BILATERAL BREAST; Future  6. Acquired hypothyroidism Will check labs and adjust synthroid  as indicated - TSH + free T4  7. Mixed hyperlipidemia Continue atorvastatin  and will update labs - Lipid Panel With LDL/HDL Ratio  8. B12 deficiency - B12 and Folate Panel  9. Dysuria - UA/M w/rflx Culture, Routine     General Counseling: milah recht understanding of the findings of todays visit and agrees with plan of treatment. I have discussed any further diagnostic evaluation that may be needed or ordered  today. We also reviewed her medications today. she has been encouraged to call the office with any questions or concerns that should arise related to todays visit.    Orders Placed This Encounter  Procedures   Microscopic Examination   MM 3D SCREENING MAMMOGRAM BILATERAL BREAST   TSH + free T4   Lipid Panel With LDL/HDL Ratio   B12 and Folate Panel   UA/M w/rflx Culture, Routine    Meds ordered this encounter  Medications   traMADol  (ULTRAM ) 50 MG tablet    Sig: Take 1/2-1 tab daily as needed for severe pain    Dispense:  30 tablet    Refill:  0    Return in about 3  months (around 11/06/2024) for med refills, mammogram to [facility] imaging.   Total time spent:35 Minutes Time spent includes review of chart, medications, test results, and follow up plan with the patient.   [place] Controlled Substance Database was reviewed by me.  This patient was seen by Tinnie Pro, PA-C in collaboration with Dr. Sigrid Bathe as a part of collaborative care agreement.  Tinnie Pro, PA-C Internal medicine

## 2024-08-07 LAB — UA/M W/RFLX CULTURE, ROUTINE
Bilirubin, UA: NEGATIVE
Glucose, UA: NEGATIVE
Ketones, UA: NEGATIVE
Leukocytes,UA: NEGATIVE
Nitrite, UA: NEGATIVE
Protein,UA: NEGATIVE
RBC, UA: NEGATIVE
Specific Gravity, UA: 1.026 (ref 1.005–1.030)
Urobilinogen, Ur: 0.2 mg/dL (ref 0.2–1.0)
pH, UA: 5 (ref 5.0–7.5)

## 2024-08-07 LAB — MICROSCOPIC EXAMINATION
Bacteria, UA: NONE SEEN
Casts: NONE SEEN /LPF
RBC, Urine: NONE SEEN /HPF (ref 0–2)
WBC, UA: NONE SEEN /HPF (ref 0–5)

## 2024-08-10 ENCOUNTER — Encounter: Attending: Internal Medicine | Admitting: *Deleted

## 2024-08-10 DIAGNOSIS — I252 Old myocardial infarction: Secondary | ICD-10-CM | POA: Diagnosis not present

## 2024-08-10 DIAGNOSIS — Z48812 Encounter for surgical aftercare following surgery on the circulatory system: Secondary | ICD-10-CM | POA: Diagnosis not present

## 2024-08-10 DIAGNOSIS — Z955 Presence of coronary angioplasty implant and graft: Secondary | ICD-10-CM | POA: Insufficient documentation

## 2024-08-10 DIAGNOSIS — I213 ST elevation (STEMI) myocardial infarction of unspecified site: Secondary | ICD-10-CM

## 2024-08-10 NOTE — Progress Notes (Signed)
 Daily Session Note  Patient Details  Name: Joy Carpenter MRN: [id number] Date of Birth: May 22, 1951 Referring Provider:   Flowsheet Row Cardiac Rehab from 07/15/2024 in [facility] Cardiac and Pulmonary Rehab  Referring Provider Dr. Cara Lovelace    Encounter Date: 08/10/2024  Check In:  Session Check In - 08/10/24 1118       Check-In   Supervising physician immediately available to respond to emergencies See telemetry face sheet for immediately available ER MD    Location [facility]-Cardiac & Pulmonary Rehab    Staff Present Leita Franks RN,BSN;Joseph [facility] Dyane BS, ACSM CEP, Exercise Physiologist    Virtual Visit No    Medication changes reported     No    Fall or balance concerns reported    No    Warm-up and Cool-down Performed on first and last piece of equipment    Resistance Training Performed Yes    VAD Patient? No    PAD/SET Patient? No      Pain Assessment   Currently in Pain? No/denies             Social History   Tobacco Use  Smoking Status Every Day   Current packs/day: 1.00   Average packs/day: 1 pack/day for 30.0 years (30.0 ttl pk-yrs)   Types: Cigarettes   Passive exposure: Past  Smokeless Tobacco Never  Tobacco Comments   1 pack daily    Goals Met:  Independence with exercise equipment Exercise tolerated well No report of concerns or symptoms today  Goals Unmet:  Not Applicable  Comments: Pt able to follow exercise prescription today without complaint.  Will continue to monitor for progression.    Dr. Oneil Pinal is Medical Director for [facility] Cardiac Rehabilitation.  Dr. Fuad Aleskerov is Medical Director for [facility] Pulmonary Rehabilitation.

## 2024-08-11 DIAGNOSIS — E782 Mixed hyperlipidemia: Secondary | ICD-10-CM | POA: Diagnosis not present

## 2024-08-11 DIAGNOSIS — E538 Deficiency of other specified B group vitamins: Secondary | ICD-10-CM | POA: Diagnosis not present

## 2024-08-12 ENCOUNTER — Encounter: Admitting: Emergency Medicine

## 2024-08-12 ENCOUNTER — Ambulatory Visit: Payer: Self-pay | Admitting: Physician Assistant

## 2024-08-12 ENCOUNTER — Encounter

## 2024-08-12 DIAGNOSIS — Z955 Presence of coronary angioplasty implant and graft: Secondary | ICD-10-CM | POA: Diagnosis not present

## 2024-08-12 DIAGNOSIS — Z48812 Encounter for surgical aftercare following surgery on the circulatory system: Secondary | ICD-10-CM | POA: Diagnosis not present

## 2024-08-12 DIAGNOSIS — I252 Old myocardial infarction: Secondary | ICD-10-CM | POA: Diagnosis not present

## 2024-08-12 DIAGNOSIS — I213 ST elevation (STEMI) myocardial infarction of unspecified site: Secondary | ICD-10-CM

## 2024-08-12 LAB — LIPID PANEL WITH LDL/HDL RATIO
Cholesterol, Total: 147 mg/dL (ref 100–199)
HDL: 65 mg/dL (ref >39)
LDL Chol Calc (NIH): 70 mg/dL (ref 0–99)
LDL/HDL Ratio: 1.1 ratio (ref 0.0–3.2)
Triglycerides: 60 mg/dL (ref 0–149)
VLDL Cholesterol Cal: 12 mg/dL (ref 5–40)

## 2024-08-12 LAB — TSH+FREE T4
Free T4: 1.21 ng/dL (ref 0.82–1.77)
TSH: 0.855 u[IU]/mL (ref 0.450–4.500)

## 2024-08-12 LAB — B12 AND FOLATE PANEL
Folate: 16.7 ng/mL (ref >3.0)
Vitamin B-12: 356 pg/mL (ref 232–1245)

## 2024-08-12 NOTE — Telephone Encounter (Signed)
Spoke with patient regarding labs.

## 2024-08-12 NOTE — Telephone Encounter (Signed)
-----   Message from Tinnie MARLA Pro sent at 08/12/2024 12:56 PM EST ----- Please let her know that her labs looked good, but her B12 was low-normal. Could consider B12 shots ----- Message ----- From: Interface, [facility] Lab Results In Sent: 08/07/2024   7:37 AM EST To: Tinnie MARLA Pro, PA-C

## 2024-08-12 NOTE — Progress Notes (Signed)
 Daily Session Note  Patient Details  Name: Joy Carpenter MRN: [id number] Date of Birth: 12-Dec-1950 Referring Provider:   Flowsheet Row Cardiac Rehab from 07/15/2024 in [facility] Cardiac and Pulmonary Rehab  Referring Provider Dr. Cara Lovelace    Encounter Date: 08/12/2024  Check In:  Session Check In - 08/12/24 1105       Check-In   Supervising physician immediately available to respond to emergencies See telemetry face sheet for immediately available ER MD    Location [facility]-Cardiac & Pulmonary Rehab    Staff Present Leita Franks RN,BSN;Joseph [facility] BS, Exercise Physiologist;Margaret Best, MS, Exercise Physiologist    Virtual Visit No    Medication changes reported     No    Fall or balance concerns reported    No    Tobacco Cessation No Change    Warm-up and Cool-down Performed on first and last piece of equipment    Resistance Training Performed Yes    VAD Patient? No    PAD/SET Patient? No      Pain Assessment   Currently in Pain? No/denies             Social History   Tobacco Use  Smoking Status Every Day   Current packs/day: 1.00   Average packs/day: 1 pack/day for 30.0 years (30.0 ttl pk-yrs)   Types: Cigarettes   Passive exposure: Past  Smokeless Tobacco Never  Tobacco Comments   1 pack daily    Goals Met:  Independence with exercise equipment Exercise tolerated well No report of concerns or symptoms today Strength training completed today  Goals Unmet:  Not Applicable  Comments: Pt able to follow exercise prescription today without complaint.  Will continue to monitor for progression.    Dr. Oneil Pinal is Medical Director for [facility] Cardiac Rehabilitation.  Dr. Fuad Aleskerov is Medical Director for [facility] Pulmonary Rehabilitation.

## 2024-08-13 ENCOUNTER — Encounter

## 2024-08-13 ENCOUNTER — Ambulatory Visit

## 2024-08-13 DIAGNOSIS — E538 Deficiency of other specified B group vitamins: Secondary | ICD-10-CM | POA: Diagnosis not present

## 2024-08-13 MED ORDER — CYANOCOBALAMIN 1000 MCG/ML IJ SOLN
1000.0000 ug | Freq: Once | INTRAMUSCULAR | Status: AC
  Administered 2024-08-13: 1000 ug via INTRAMUSCULAR

## 2024-08-17 ENCOUNTER — Encounter

## 2024-08-17 DIAGNOSIS — Z48812 Encounter for surgical aftercare following surgery on the circulatory system: Secondary | ICD-10-CM | POA: Diagnosis not present

## 2024-08-17 DIAGNOSIS — I213 ST elevation (STEMI) myocardial infarction of unspecified site: Secondary | ICD-10-CM

## 2024-08-17 DIAGNOSIS — Z955 Presence of coronary angioplasty implant and graft: Secondary | ICD-10-CM

## 2024-08-17 DIAGNOSIS — I252 Old myocardial infarction: Secondary | ICD-10-CM | POA: Diagnosis not present

## 2024-08-17 NOTE — Progress Notes (Signed)
 Daily Session Note  Patient Details  Name: Layani Foronda MRN: [id number] Date of Birth: [birth year] Referring Provider:   Flowsheet Row Cardiac Rehab from 07/15/2024 in [facility] Cardiac and Pulmonary Rehab  Referring Provider Dr. Cara Lovelace    Encounter Date: 08/17/2024  Check In:  Session Check In - 08/17/24 1110       Check-In   Supervising physician immediately available to respond to emergencies See telemetry face sheet for immediately available ER MD    Location [facility]-Cardiac & Pulmonary Rehab    Staff Present Burnard Hint BS, ACSM CEP, Exercise Physiologist;Joseph Rolinda RCP,RRT,BSRT;Laura Cates RN,BSN;Tauheedah Bok RN,BSN,MPA;Mary Godley, RN, DNP, NE-BC    Virtual Visit No    Medication changes reported     No    Fall or balance concerns reported    No    Tobacco Cessation No Change    Warm-up and Cool-down Performed on first and last piece of equipment    Resistance Training Performed Yes    VAD Patient? No    PAD/SET Patient? No      Pain Assessment   Currently in Pain? No/denies             Social History   Tobacco Use  Smoking Status Every Day   Current packs/day: 1.00   Average packs/day: 1 pack/day for 30.0 years (30.0 ttl pk-yrs)   Types: Cigarettes   Passive exposure: Past  Smokeless Tobacco Never  Tobacco Comments   1 pack daily    Goals Met:  Independence with exercise equipment Exercise tolerated well No report of concerns or symptoms today Strength training completed today  Goals Unmet:  Not Applicable  Comments: Pt able to follow exercise prescription today without complaint.  Will continue to monitor for progression.    Dr. Oneil Pinal is Medical Director for [facility] Cardiac Rehabilitation.  Dr. Fuad Aleskerov is Medical Director for [facility] Pulmonary Rehabilitation.

## 2024-08-18 DIAGNOSIS — Z1231 Encounter for screening mammogram for malignant neoplasm of breast: Secondary | ICD-10-CM | POA: Diagnosis not present

## 2024-08-19 ENCOUNTER — Encounter

## 2024-08-19 ENCOUNTER — Encounter: Admitting: Emergency Medicine

## 2024-08-19 DIAGNOSIS — Z48812 Encounter for surgical aftercare following surgery on the circulatory system: Secondary | ICD-10-CM | POA: Diagnosis not present

## 2024-08-19 DIAGNOSIS — Z955 Presence of coronary angioplasty implant and graft: Secondary | ICD-10-CM

## 2024-08-19 DIAGNOSIS — I213 ST elevation (STEMI) myocardial infarction of unspecified site: Secondary | ICD-10-CM

## 2024-08-19 DIAGNOSIS — I252 Old myocardial infarction: Secondary | ICD-10-CM | POA: Diagnosis not present

## 2024-08-19 NOTE — Progress Notes (Signed)
 Daily Session Note  Patient Details  Name: Joy Carpenter MRN: [id number] Date of Birth: 1950/11/27 Referring Provider:   Flowsheet Row Cardiac Rehab from 07/15/2024 in [facility] Card[facility] Rehab  Referring Provider Dr. Cara Lovelace    Encounter Date: 08/19/2024  Check In:  Session Check In - 08/19/24 1108       Check-In   Supervising physician immediately available to respond to emergencies See telemetry face sheet for immediately available ER MD    Location [facility]-Cardiac & Pulmonary Rehab    Staff Present Leita Franks RN,BSN;Joseph [facility] BS, Exercise Physiologist;Margaret Best, MS, Exercise Physiologist    Virtual Visit No    Medication changes reported     No    Fall or balance concerns reported    No    Tobacco Cessation No Change    Warm-up and Cool-down Performed on first and last piece of equipment    Resistance Training Performed Yes    VAD Patient? No    PAD/SET Patient? No      Pain Assessment   Currently in Pain? No/denies             Social History   Tobacco Use  Smoking Status Every Day   Current packs/day: 1.00   Average packs/day: 1 pack/day for 30.0 years (30.0 ttl pk-yrs)   Types: Cigarettes   Passive exposure: Past  Smokeless Tobacco Never  Tobacco Comments   1 pack daily    Goals Met:  Independence with exercise equipment Exercise tolerated well No report of concerns or symptoms today Strength training completed today  Goals Unmet:  Not Applicable  Comments: Pt able to follow exercise prescription today without complaint.  Will continue to monitor for progression.    Dr. Oneil Pinal is Medical Director for [facility] Cardiac Rehabilitation.  Dr. Fuad Aleskerov is Medical Director for [facility] Pulmonary Rehabilitation.

## 2024-08-20 ENCOUNTER — Ambulatory Visit (INDEPENDENT_AMBULATORY_CARE_PROVIDER_SITE_OTHER)

## 2024-08-20 ENCOUNTER — Encounter

## 2024-08-20 DIAGNOSIS — E538 Deficiency of other specified B group vitamins: Secondary | ICD-10-CM

## 2024-08-20 MED ORDER — CYANOCOBALAMIN 1000 MCG/ML IJ SOLN
1000.0000 ug | Freq: Once | INTRAMUSCULAR | Status: AC
  Administered 2024-08-20: 1000 ug via INTRAMUSCULAR

## 2024-08-22 ENCOUNTER — Other Ambulatory Visit: Payer: Self-pay | Admitting: Physician Assistant

## 2024-08-22 DIAGNOSIS — R300 Dysuria: Secondary | ICD-10-CM

## 2024-08-22 DIAGNOSIS — Z79899 Other long term (current) drug therapy: Secondary | ICD-10-CM

## 2024-08-24 ENCOUNTER — Encounter

## 2024-08-24 DIAGNOSIS — Z955 Presence of coronary angioplasty implant and graft: Secondary | ICD-10-CM | POA: Diagnosis not present

## 2024-08-24 DIAGNOSIS — I213 ST elevation (STEMI) myocardial infarction of unspecified site: Secondary | ICD-10-CM

## 2024-08-24 DIAGNOSIS — I252 Old myocardial infarction: Secondary | ICD-10-CM | POA: Diagnosis not present

## 2024-08-24 DIAGNOSIS — Z48812 Encounter for surgical aftercare following surgery on the circulatory system: Secondary | ICD-10-CM | POA: Diagnosis not present

## 2024-08-24 NOTE — Progress Notes (Signed)
 Daily Session Note  Patient Details  Name: Joy Carpenter MRN: [id number] Date of Birth: 07-29-1951 Referring Provider:   Flowsheet Row Cardiac Rehab from 07/15/2024 in [facility] Cardiac and Pulmonary Rehab  Referring Provider Dr. Cara Lovelace    Encounter Date: 08/24/2024  Check In:  Session Check In - 08/24/24 1111       Check-In   Supervising physician immediately available to respond to emergencies See telemetry face sheet for immediately available ER MD    Location [facility]-Cardiac & Pulmonary Rehab    Staff Present Burnard Davenport RN,BSN,MPA;Joseph Rolinda RCP,RRT,BSRT;Laura Cates RN,BSN;Kathy Wahid Dyane BS, ACSM CEP, Exercise Physiologist    Virtual Visit No    Medication changes reported     No    Fall or balance concerns reported    No    Tobacco Cessation No Change    Warm-up and Cool-down Performed on first and last piece of equipment    Resistance Training Performed Yes    VAD Patient? No    PAD/SET Patient? No      Pain Assessment   Currently in Pain? No/denies             Social History   Tobacco Use  Smoking Status Every Day   Current packs/day: 1.00   Average packs/day: 1 pack/day for 30.0 years (30.0 ttl pk-yrs)   Types: Cigarettes   Passive exposure: Past  Smokeless Tobacco Never  Tobacco Comments   1 pack daily    Goals Met:  Independence with exercise equipment Exercise tolerated well No report of concerns or symptoms today Strength training completed today  Goals Unmet:  Not Applicable  Comments: Pt able to follow exercise prescription today without complaint.  Will continue to monitor for progression.    Dr. Oneil Pinal is Medical Director for [facility] Cardiac Rehabilitation.  Dr. Fuad Aleskerov is Medical Director for [facility] Pulmonary Rehabilitation.

## 2024-08-26 ENCOUNTER — Encounter

## 2024-08-26 DIAGNOSIS — I213 ST elevation (STEMI) myocardial infarction of unspecified site: Secondary | ICD-10-CM

## 2024-08-26 DIAGNOSIS — Z955 Presence of coronary angioplasty implant and graft: Secondary | ICD-10-CM

## 2024-08-26 NOTE — Progress Notes (Signed)
 Cardiac Individual Treatment Plan  Patient Details  Name: Joy Carpenter MRN: [id number] Date of Birth: 11-Jun-1951 Referring Provider:   Flowsheet Row Cardiac Rehab from 07/15/2024 in [facility] Cardiac and Pulmonary Rehab  Referring Provider Dr. Cara Lovelace    Initial Encounter Date:  Flowsheet Row Cardiac Rehab from 07/15/2024 in [facility] Cardiac and Pulmonary Rehab  Date 07/15/24    Visit Diagnosis: ST elevation myocardial infarction (STEMI), unspecified artery (HCC)  Status post coronary artery stent placement  Patient's Home Medications on Admission:  Current Outpatient Medications:    ALPRAZolam  (XANAX ) 0.25 MG tablet, TAKE 1 TABLET BY MOUTH AS NEEDED FOR ANXIETY, Disp: 30 tablet, Rfl: 2   aspirin  81 MG chewable tablet, Chew 81 mg by mouth daily., Disp: , Rfl:    atorvastatin  (LIPITOR ) 40 MG tablet, TAKE 1 TABLET BY MOUTH EVERY DAY, Disp: 90 tablet, Rfl: 3   desipramine (NORPRAMIN) 25 MG tablet, Take 25 mg by mouth., Disp: , Rfl:    levothyroxine  (SYNTHROID ) 50 MCG tablet, TAKE 1 TABLET BY MOUTH EVERY DAY IN THE MORNING, Disp: 90 tablet, Rfl: 1   pantoprazole  (PROTONIX ) 40 MG tablet, TAKE 1 TABLET BY MOUTH EVERY DAY, Disp: 90 tablet, Rfl: 1   prasugrel  (EFFIENT ) 10 MG TABS tablet, Take 0.5 tablets (5 mg total) by mouth daily., Disp: 30 tablet, Rfl: 2   traMADol  (ULTRAM ) 50 MG tablet, Take 1/2-1 tab daily as needed for severe pain, Disp: 30 tablet, Rfl: 0   valACYclovir  (VALTREX ) 500 MG tablet, TAKE 1 TABLET (500 MG TOTAL) BY MOUTH DAILY., Disp: 90 tablet, Rfl: 1   zolpidem  (AMBIEN ) 10 MG tablet, TAKE 1 TABLET BY MOUTH EVERYDAY AT BEDTIME, Disp: 30 tablet, Rfl: 2  Past Medical History: Past Medical History:  Diagnosis Date   Acquired hypothyroidism    Actinic keratosis 03/28/2021   right upper arm, bx proven   Anxiety    a.) on BZO (alproazolam) PRN   Carpal tunnel syndrome 2019   Coronary artery disease involving native coronary artery    of the LAD and RCA noted on  chest CT   Depression    Diastolic dysfunction    Emphysema of lung (HCC)    GERD (gastroesophageal reflux disease)    Herpes    History of hiatal hernia    Hypertensive chronic kidney disease    Insomnia    a.) on hypnotic (zolpidem ) PRN   Long-term use of aspirin  therapy    Mixed hyperlipidemia    Osteoporosis    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   Primary hypertension    Pulmonary nodules    SCC (squamous cell carcinoma) 03/28/2021   left dorsal hand, EDC   SCC (squamous cell carcinoma) 08/01/2023   SCC IS, L ant shoulder, EDC 08/27/2023   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   left anterior shoulder, EDC 08/27/23   Stress headaches (almost daily)    Thoracoabdominal aortic aneurysm    a.) s/p 4v FEVAR (CA, SMA, RRA, LRA) and proximal TEVAR extention with BILATERAL iliac limb extensions on 11/16/2020   Vertigo 2010   Vitamin D  deficiency    Weakness of both legs (intermittent)     Tobacco Use: Social History   Tobacco Use  Smoking Status Every Day   Current packs/day: 1.00   Average packs/day: 1 pack/day for 30.0 years (30.0 ttl pk-yrs)   Types: Cigarettes   Passive exposure: Past  Smokeless Tobacco Never  Tobacco Comments   1 pack daily    Labs: Review Flowsheet  More  data exists      Latest Ref Rng & Units 09/14/2021 07/02/2022 04/22/2023 06/22/2024 08/11/2024  Labs for ITP Cardiac and Pulmonary Rehab  Cholestrol 100 - 199 mg/dL 800  821  864  861  852   LDL (calc) 0 - 99 mg/dL 872  891  73  58  70   HDL-C >39 mg/dL 54  58  53  66  65   Trlycerides 0 - 149 mg/dL 898  64  46  70  60   Hemoglobin A1c 4.8 - 5.6 % - - - 5.6  -     Exercise Target Goals: Exercise Program Goal: Individual exercise prescription set using results from initial 6 min walk test and THRR while considering  patient's activity barriers and safety.   Exercise Prescription Goal: Initial exercise prescription builds to 30-45 minutes a day of aerobic activity, 2-3 days per week.   Home exercise guidelines will be given to patient during program as part of exercise prescription that the participant will acknowledge.   Education: Aerobic Exercise: - Group verbal and visual presentation on the components of exercise prescription. Introduces F.I.T.T principle from ACSM for exercise prescriptions.  Reviews F.I.T.T. principles of aerobic exercise including progression. Written material provided at class time. Flowsheet Row Cardiac Rehab from 08/19/2024 in [facility] Cardiac and Pulmonary Rehab  Education need identified 07/15/24    Education: Resistance Exercise: - Group verbal and visual presentation on the components of exercise prescription. Introduces F.I.T.T principle from ACSM for exercise prescriptions  Reviews F.I.T.T. principles of resistance exercise including progression. Written material provided at class time.    Education: Exercise & Equipment Safety: - Individual verbal instruction and demonstration of equipment use and safety with use of the equipment. Flowsheet Row Cardiac Rehab from 08/19/2024 in [facility] Cardiac and Pulmonary Rehab  Date 07/15/24  Educator [facility]  Instruction Review Code 1- Verbalizes Understanding    Education: Exercise Physiology & General Exercise Guidelines: - Group verbal and written instruction with models to review the exercise physiology of the cardiovascular system and associated critical values. Provides general exercise guidelines with specific guidelines to those with heart or lung disease. Written material provided at class time.   Education: Flexibility, Balance, Mind/Body Relaxation: - Group verbal and visual presentation with interactive activity on the components of exercise prescription. Introduces F.I.T.T principle from ACSM for exercise prescriptions. Reviews F.I.T.T. principles of flexibility and balance exercise training including progression. Also discusses the mind body connection.  Reviews various relaxation techniques to  help reduce and manage stress (i.e. Deep breathing, progressive muscle relaxation, and visualization). Balance handout provided to take home. Written material provided at class time.   Activity Barriers & Risk Stratification:  Activity Barriers & Cardiac Risk Stratification - 07/15/24 1530       Activity Barriers & Cardiac Risk Stratification   Activity Barriers Back Problems    Cardiac Risk Stratification High          6 Minute Walk:  6 Minute Walk     Row Name 07/15/24 1528         6 Minute Walk   Phase Initial     Distance 1205 feet     Walk Time 6 minutes     # of Rest Breaks 0     MPH 2.3     METS 3.03     RPE 8     Perceived Dyspnea  0     VO2 Peak 10.6     Symptoms Yes (comment)  Comments Left calf cramp     Resting HR 80 bpm     Resting BP 116/64     Resting Oxygen Saturation  98 %     Exercise Oxygen Saturation  during 6 min walk 99 %     Max Ex. HR 103 bpm     Max Ex. BP 124/70     2 Minute Post BP 112/68        Oxygen Initial Assessment:   Oxygen Re-Evaluation:   Oxygen Discharge (Final Oxygen Re-Evaluation):   Initial Exercise Prescription:  Initial Exercise Prescription - 07/15/24 1500       Date of Initial Exercise RX and Referring Provider   Date 07/15/24    Referring Provider Dr. Cara Lovelace      Oxygen   Maintain Oxygen Saturation 88% or higher      Treadmill   MPH 2.5    Grade 0    Minutes 15    METs 2.91      NuStep   Level 3    SPM 80    Minutes 15    METs 3      REL-XR   Level 3    Watts 25    Speed 50    Minutes 15    METs 3      T5 Nustep   Level 3    SPM 80    Minutes 15    METs 3      Biostep-RELP   Level 3    SPM 80    Minutes 15    METs 3      Rower   Level 7    Watts 25    Minutes 15    METs 3      Prescription Details   Duration Progress to 30 minutes of continuous aerobic without signs/symptoms of physical distress      Intensity   THRR 40-80% of Max Heartrate 107-135     Ratings of Perceived Exertion 11-13    Perceived Dyspnea 0-4      Progression   Progression Continue to progress workloads to maintain intensity without signs/symptoms of physical distress.      Resistance Training   Training Prescription Yes    Weight 4lb    Reps 10-15          Perform Capillary Blood Glucose checks as needed.  Exercise Prescription Changes:   Exercise Prescription Changes     Row Name 07/15/24 1500 08/12/24 1300 08/25/24 1400         Response to Exercise   Blood Pressure (Admit) 116/64 102/62 102/62     Blood Pressure (Exercise) 124/70 100/60 152/72     Blood Pressure (Exit) 112/68 98/54 112/62     Heart Rate (Admit) 80 bpm 88 bpm 80 bpm     Heart Rate (Exercise) 103 bpm 124 bpm 116 bpm     Heart Rate (Exit) 76 bpm 98 bpm 93 bpm     Oxygen Saturation (Admit) 98 % -- --     Oxygen Saturation (Exercise) 99 % -- --     Oxygen Saturation (Exit) 99 % -- --     Rating of Perceived Exertion (Exercise) 8 12 13      Perceived Dyspnea (Exercise) 0 -- --     Symptoms left calf cramp none none     Comments results 1st day --     Duration -- Progress to 30 minutes of  aerobic without signs/symptoms of physical distress  Continue with 30 min of aerobic exercise without signs/symptoms of physical distress.     Intensity -- THRR unchanged THRR unchanged       Progression   Progression -- Continue to progress workloads to maintain intensity without signs/symptoms of physical distress. Continue to progress workloads to maintain intensity without signs/symptoms of physical distress.     Average METs -- 2.1 2.95       Resistance Training   Training Prescription -- Yes Yes     Weight -- 4lb 4 lb     Reps -- 10-15 10-15       Interval Training   Interval Training -- No No       Treadmill   MPH -- 1.7 2.2     Grade -- 0 1     Minutes -- 15 15     METs -- 2.3 2.99       NuStep   Level -- 2  T6 Nustep 3     Minutes -- 15 15     METs -- 1.9 2.9        REL-XR   Level -- -- 4     Minutes -- -- 15     METs -- -- 3.7       Oxygen   Maintain Oxygen Saturation -- 88% or higher 88% or higher        Exercise Comments:   Exercise Comments     Row Name 08/05/24 1438           Exercise Comments First full day of exercise!  Patient was oriented to gym and equipment including functions, settings, policies, and procedures.  Patient's individual exercise prescription and treatment plan were reviewed.  All starting workloads were established based on the results of the 6 minute walk test done at initial orientation visit.  The plan for exercise progression was also introduced and progression will be customized based on patient's performance and goals.          Exercise Goals and Review:   Exercise Goals     Row Name 07/15/24 1533             Exercise Goals   Increase Physical Activity Yes       Intervention Develop an individualized exercise prescription for aerobic and resistive training based on initial evaluation findings, risk stratification, comorbidities and participant's personal goals.;Provide advice, education, support and counseling about physical activity/exercise needs.       Expected Outcomes Short Term: Attend rehab on a regular basis to increase amount of physical activity.;Long Term: Add in home exercise to make exercise part of routine and to increase amount of physical activity.;Long Term: Exercising regularly at least 3-5 days a week.       Increase Strength and Stamina Yes       Intervention Provide advice, education, support and counseling about physical activity/exercise needs.;Develop an individualized exercise prescription for aerobic and resistive training based on initial evaluation findings, risk stratification, comorbidities and participant's personal goals.       Expected Outcomes Short Term: Increase workloads from initial exercise prescription for resistance, speed, and METs.;Short Term: Perform resistance  training exercises routinely during rehab and add in resistance training at home;Long Term: Improve cardiorespiratory fitness, muscular endurance and strength as measured by increased METs and functional capacity ( )       Able to understand and use rate of perceived exertion (RPE) scale Yes       Intervention Provide education and explanation on how  to use RPE scale       Expected Outcomes Short Term: Able to use RPE daily in rehab to express subjective intensity level;Long Term:  Able to use RPE to guide intensity level when exercising independently       Able to understand and use Dyspnea scale Yes       Intervention Provide education and explanation on how to use Dyspnea scale       Expected Outcomes Short Term: Able to use Dyspnea scale daily in rehab to express subjective sense of shortness of breath during exertion;Long Term: Able to use Dyspnea scale to guide intensity level when exercising independently       Knowledge and understanding of Target Heart Rate Range (THRR) Yes       Intervention Provide education and explanation of THRR including how the numbers were predicted and where they are located for reference       Expected Outcomes Short Term: Able to state/look up THRR;Short Term: Able to use daily as guideline for intensity in rehab;Long Term: Able to use THRR to govern intensity when exercising independently       Able to check pulse independently Yes       Intervention Provide education and demonstration on how to check pulse in carotid and radial arteries.;Review the importance of being able to check your own pulse for safety during independent exercise       Expected Outcomes Short Term: Able to explain why pulse checking is important during independent exercise;Long Term: Able to check pulse independently and accurately       Understanding of Exercise Prescription Yes       Intervention Provide education, explanation, and written materials on patient's individual exercise  prescription       Expected Outcomes Short Term: Able to explain program exercise prescription;Long Term: Able to explain home exercise prescription to exercise independently          Exercise Goals Re-Evaluation :  Exercise Goals Re-Evaluation     Row Name 08/05/24 1438 08/12/24 1322 08/25/24 1500         Exercise Goal Re-Evaluation   Exercise Goals Review Increase Physical Activity;Able to understand and use rate of perceived exertion (RPE) scale;Knowledge and understanding of Target Heart Rate Range (THRR);Understanding of Exercise Prescription;Increase Strength and Stamina;Able to understand and use Dyspnea scale;Able to check pulse independently Increase Physical Activity;Understanding of Exercise Prescription;Increase Strength and Stamina Increase Physical Activity;Understanding of Exercise Prescription;Increase Strength and Stamina     Comments Reviewed RPE and dyspnea scale, THR and program prescription with pt today.  Pt voiced understanding and was given a copy of goals to take home. Joy Carpenter is off to a good start in the program and she completed her first day of exercise in this review. She was able to walk at a speed of 1.7 mph on the treadmill. She also worked at level 2 on the T6 nustep. We will continue to monitor her progress in the program. Joy Carpenter is doing well in rehab. She recently increased her treadmill workload to a speed of 2.2 mph with an incline of 1%. She also improved to level 3 on the T4 nustep and level 4 on the XR. We will continue to monitor her progress in the program.     Expected Outcomes Short: Use RPE daily to regulate intensity. Long: Follow program prescription in THR. Short: Continue to follow current exercise prescription. Long: Continue exercise to improve strength and stamina. Short: Continue to progressively increase workloads. Long: Continue  exercise to improve strength and stamina.        Discharge Exercise Prescription (Final Exercise Prescription  Changes):  Exercise Prescription Changes - 08/25/24 1400       Response to Exercise   Blood Pressure (Admit) 102/62    Blood Pressure (Exercise) 152/72    Blood Pressure (Exit) 112/62    Heart Rate (Admit) 80 bpm    Heart Rate (Exercise) 116 bpm    Heart Rate (Exit) 93 bpm    Rating of Perceived Exertion (Exercise) 13    Symptoms none    Duration Continue with 30 min of aerobic exercise without signs/symptoms of physical distress.    Intensity THRR unchanged      Progression   Progression Continue to progress workloads to maintain intensity without signs/symptoms of physical distress.    Average METs 2.95      Resistance Training   Training Prescription Yes    Weight 4 lb    Reps 10-15      Interval Training   Interval Training No      Treadmill   MPH 2.2    Grade 1    Minutes 15    METs 2.99      NuStep   Level 3    Minutes 15    METs 2.9      REL-XR   Level 4    Minutes 15    METs 3.7      Oxygen   Maintain Oxygen Saturation 88% or higher          Nutrition:  Target Goals: Understanding of nutrition guidelines, daily intake of sodium 1500mg , cholesterol 200mg , calories 30% from fat and 7% or less from saturated fats, daily to have 5 or more servings of fruits and vegetables.  Education: Nutrition 1 -Group instruction provided by verbal, written material, interactive activities, discussions, models, and posters to present general guidelines for heart healthy nutrition including macronutrients, label reading, and promoting whole foods over processed counterparts. Education serves as pensions consultant of discussion of heart healthy eating for all. Written material provided at class time.    Education: Nutrition 2 -Group instruction provided by verbal, written material, interactive activities, discussions, models, and posters to present general guidelines for heart healthy nutrition including sodium, cholesterol, and saturated fat. Providing guidance of habit  forming to improve blood pressure, cholesterol, and body weight. Written material provided at class time. Flowsheet Row Cardiac Rehab from 08/19/2024 in [facility] Cardiac and Pulmonary Rehab  Date 08/05/24  Educator jg  Instruction Review Code 1- Verbalizes Understanding      Biometrics:  Pre Biometrics - 07/15/24 1533       Pre Biometrics   Height 5' 6.3 (1.684 m)    Weight 128 lb 6.4 oz (58.2 kg)    Waist Circumference 31 inches    Hip Circumference 38 inches    Waist to Hip Ratio 0.82 %    BMI (Calculated) 20.54    Single Leg Stand 30 seconds           Nutrition Therapy Plan and Nutrition Goals:   Nutrition Assessments:  MEDIFICTS Score Key: >=70 Need to make dietary changes  40-70 Heart Healthy Diet <= 40 Therapeutic Level Cholesterol Diet  Flowsheet Row Cardiac Rehab from 07/15/2024 in [facility] Cardiac and Pulmonary Rehab  Picture Your Plate Total Score on Admission 57   Picture Your Plate Scores: <59 Unhealthy dietary pattern with much room for improvement. 41-50 Dietary pattern unlikely to meet recommendations for good health and room  for improvement. 51-60 More healthful dietary pattern, with some room for improvement.  >60 Healthy dietary pattern, although there may be some specific behaviors that could be improved.    Nutrition Goals Re-Evaluation:   Nutrition Goals Discharge (Final Nutrition Goals Re-Evaluation):   Psychosocial: Target Goals: Acknowledge presence or absence of significant depression and/or stress, maximize coping skills, provide positive support system. Participant is able to verbalize types and ability to use techniques and skills needed for reducing stress and depression.   Education: Stress, Anxiety, and Depression - Group verbal and visual presentation to define topics covered.  Reviews how body is impacted by stress, anxiety, and depression.  Also discusses healthy ways to reduce stress and to treat/manage anxiety and depression.  Written material provided at class time.   Education: Sleep Hygiene -Provides group verbal and written instruction about how sleep can affect your health.  Define sleep hygiene, discuss sleep cycles and impact of sleep habits. Review good sleep hygiene tips.   Initial Review & Psychosocial Screening:  Initial Psych Review & Screening - 07/10/24 0946       Initial Review   Current issues with Current Sleep Concerns;Current Anxiety/Panic;Current Psychotropic Meds      Family Dynamics   Good Support System? Yes      Screening Interventions   Interventions Encouraged to exercise    Expected Outcomes Short Term goal: Utilizing psychosocial counselor, staff and physician to assist with identification of specific Stressors or current issues interfering with healing process. Setting desired goal for each stressor or current issue identified.;Long Term Goal: Stressors or current issues are controlled or eliminated.;Short Term goal: Identification and review with participant of any Quality of Life or Depression concerns found by scoring the questionnaire.;Long Term goal: The participant improves quality of Life and PHQ9 Scores as seen by post scores and/or verbalization of changes          Quality of Life Scores:   Quality of Life - 07/15/24 1534       Quality of Life   Select Quality of Life      Quality of Life Scores   Health/Function Pre 24.6 %    Socioeconomic Pre 26.25 %    Psych/Spiritual Pre 28.79 %    Family Pre 19.8 %    GLOBAL Pre 25.13 %         Scores of 19 and below usually indicate a poorer quality of life in these areas.  A difference of  2-3 points is a clinically meaningful difference.  A difference of 2-3 points in the total score of the Quality of Life Index has been associated with significant improvement in overall quality of life, self-image, physical symptoms, and general health in studies assessing change in quality of life.  PHQ-9: Review Flowsheet  More  data exists      07/15/2024 08/01/2023 04/22/2023 12/18/2021 06/19/2021  Depression screen PHQ 2/9  Decreased Interest 0 0 0 0 0  Down, Depressed, Hopeless 0 0 0 0 0  PHQ - 2 Score 0 0 0 0 0  Altered sleeping 1 - - - -  Tired, decreased energy 1 - - - -  Change in appetite 0 - - - -  Feeling bad or failure about yourself  0 - - - -  Trouble concentrating 0 - - - -  Moving slowly or fidgety/restless 0 - - - -  Suicidal thoughts 0 - - - -  PHQ-9 Score 2  - - - -  Difficult doing work/chores  Not difficult at all - - - -    Details       Data saved with a previous flowsheet row definition        Interpretation of Total Score  Total Score Depression Severity:  1-4 = Minimal depression, 5-9 = Mild depression, 10-14 = Moderate depression, 15-19 = Moderately severe depression, 20-27 = Severe depression   Psychosocial Evaluation and Intervention:  Psychosocial Evaluation - 07/10/24 1000       Psychosocial Evaluation & Interventions   Interventions Encouraged to exercise with the program and follow exercise prescription;Stress management education;Relaxation education    Comments Joy Carpenter is coming to cardiac rehab post STEMI w/ stent. Her wrist is still very bruised and painful, so she is following up weekly with her cardiologist. She mentions it is causing sleep concerns on top of her already problems with staying asleep. She is trying to get in touch with one of her doctors who prescribed some sleep medicine so she can get a refill. She is a current smoker and is interested in quitting. She mentions in December she lost her fiance to lung and brain cancer, so her stress level has been up and smoking is a comfort to her. She has returned to her job at [employer] and is ready to get started in the program to learn more about heart healthy living.    Expected Outcomes Short: attend cardiac rehab for education and exercise Long: develop and maintain positive self care habits    Continue  Psychosocial Services  Follow up required by staff          Psychosocial Re-Evaluation:   Psychosocial Discharge (Final Psychosocial Re-Evaluation):   Vocational Rehabilitation: Provide vocational rehab assistance to qualifying candidates.   Vocational Rehab Evaluation & Intervention:  Vocational Rehab - 07/10/24 0953       Initial Vocational Rehab Evaluation & Intervention   Assessment shows need for Vocational Rehabilitation No          Education: Education Goals: Education classes will be provided on a variety of topics geared toward better understanding of heart health and risk factor modification. Participant will state understanding/return demonstration of topics presented as noted by education test scores.  Learning Barriers/Preferences:  Learning Barriers/Preferences - 07/10/24 9047       Learning Barriers/Preferences   Learning Barriers None    Learning Preferences None          General Cardiac Education Topics:  AED/CPR: - Group verbal and written instruction with the use of models to demonstrate the basic use of the AED with the basic ABC's of resuscitation.   Test and Procedures: - Group verbal and visual presentation and models provide information about basic cardiac anatomy and function. Reviews the testing methods done to diagnose heart disease and the outcomes of the test results. Describes the treatment choices: Medical Management, Angioplasty, or Coronary Bypass Surgery for treating various heart conditions including Myocardial Infarction, Angina, Valve Disease, and Cardiac Arrhythmias. Written material provided at class time. Flowsheet Row Cardiac Rehab from 08/19/2024 in [facility] Cardiac and Pulmonary Rehab  Date 08/12/24  Educator lc  Instruction Review Code 1- Verbalizes Understanding    Medication Safety: - Group verbal and visual instruction to review commonly prescribed medications for heart and lung disease. Reviews the medication, class  of the drug, and side effects. Includes the steps to properly store meds and maintain the prescription regimen. Written material provided at class time. Flowsheet Row Cardiac Rehab from 08/19/2024 in [facility] Cardiac and Pulmonary  Rehab  Date 08/19/24  Educator kb  Instruction Review Code 1- Verbalizes Understanding    Intimacy: - Group verbal instruction through game format to discuss how heart and lung disease can affect sexual intimacy. Written material provided at class time.   Know Your Numbers and Heart Failure: - Group verbal and visual instruction to discuss disease risk factors for cardiac and pulmonary disease and treatment options.  Reviews associated critical values for Overweight/Obesity, Hypertension, Cholesterol, and Diabetes.  Discusses basics of heart failure: signs/symptoms and treatments.  Introduces Heart Failure Zone chart for action plan for heart failure. Written material provided at class time. Flowsheet Row Cardiac Rehab from 08/19/2024 in [facility] Cardiac and Pulmonary Rehab  Education need identified 07/15/24    Infection Prevention: - Provides verbal and written material to individual with discussion of infection control including proper hand washing and proper equipment cleaning during exercise session. Flowsheet Row Cardiac Rehab from 08/19/2024 in [facility] Cardiac and Pulmonary Rehab  Date 07/15/24  Educator [facility]  Instruction Review Code 1- Verbalizes Understanding    Falls Prevention: - Provides verbal and written material to individual with discussion of falls prevention and safety. Flowsheet Row Cardiac Rehab from 08/19/2024 in [facility] Cardiac and Pulmonary Rehab  Date 07/15/24  Educator [facility]  Instruction Review Code 1- Verbalizes Understanding    Other: -Provides group and verbal instruction on various topics (see comments)   Knowledge Questionnaire Score:  Knowledge Questionnaire Score - 07/15/24 1535       Knowledge Questionnaire Score   Pre Score 22/26           Core Components/Risk Factors/Patient Goals at Admission:  Personal Goals and Risk Factors at Admission - 07/10/24 0951       Core Components/Risk Factors/Patient Goals on Admission    Weight Management Yes;Weight Maintenance    Intervention Weight Management: Develop a combined nutrition and exercise program designed to reach desired caloric intake, while maintaining appropriate intake of nutrient and fiber, sodium and fats, and appropriate energy expenditure required for the weight goal.;Weight Management: Provide education and appropriate resources to help participant work on and attain dietary goals.    Admit Weight 125 lb (56.7 kg)    Expected Outcomes Short Term: Continue to assess and modify interventions until short term weight is achieved;Long Term: Adherence to nutrition and physical activity/exercise program aimed toward attainment of established weight goal;Weight Maintenance: Understanding of the daily nutrition guidelines, which includes 25-35% calories from fat, 7% or less cal from saturated fats, less than 200mg  cholesterol, less than 1.5gm of sodium, & 5 or more servings of fruits and vegetables daily;Understanding recommendations for meals to include 15-35% energy as protein, 25-35% energy from fat, 35-60% energy from carbohydrates, less than 200mg  of dietary cholesterol, 20-35 gm of total fiber daily;Understanding of distribution of calorie intake throughout the day with the consumption of 4-5 meals/snacks    Tobacco Cessation Yes    Intervention Assist the participant in steps to quit. Provide individualized education and counseling about committing to Tobacco Cessation, relapse prevention, and pharmacological support that can be provided by physician.;Education officer, environmental, assist with locating and accessing local/national Quit Smoking programs, and support quit date choice.    Expected Outcomes Short Term: Will demonstrate readiness to quit, by selecting a quit  date.;Short Term: Will quit all tobacco product use, adhering to prevention of relapse plan.;Long Term: Complete abstinence from all tobacco products for at least 12 months from quit date.    Hypertension Yes    Intervention Provide education on  lifestyle modifcations including regular physical activity/exercise, weight management, moderate sodium restriction and increased consumption of fresh fruit, vegetables, and low fat dairy, alcohol moderation, and smoking cessation.;Monitor prescription use compliance.    Expected Outcomes Short Term: Continued assessment and intervention until BP is < 140/75mm HG in hypertensive participants. < 130/78mm HG in hypertensive participants with diabetes, heart failure or chronic kidney disease.;Long Term: Maintenance of blood pressure at goal levels.    Lipids Yes    Intervention Provide education and support for participant on nutrition & aerobic/resistive exercise along with prescribed medications to achieve LDL 70mg , HDL >40mg .    Expected Outcomes Short Term: Participant states understanding of desired cholesterol values and is compliant with medications prescribed. Participant is following exercise prescription and nutrition guidelines.;Long Term: Cholesterol controlled with medications as prescribed, with individualized exercise RX and with personalized nutrition plan. Value goals: LDL < 70mg , HDL > 40 mg.          Education:Diabetes - Individual verbal and written instruction to review signs/symptoms of diabetes, desired ranges of glucose level fasting, after meals and with exercise. Acknowledge that pre and post exercise glucose checks will be done for 3 sessions at entry of program.   Core Components/Risk Factors/Patient Goals Review:    Core Components/Risk Factors/Patient Goals at Discharge (Final Review):    ITP Comments:  ITP Comments     Row Name 07/10/24 0943 07/15/24 1528 07/29/24 1050 08/05/24 1438 08/26/24 0946   ITP Comments Initial  phone call completed. Diagnosis can be found in [facility] 9/15. EP Orientation scheduled for Wednesday 10/8 at 1:45. Joy Carpenter is a current tobacco user. Intervention for tobacco cessation was provided at the initial medical review. She was asked about readiness to quit and reported she is ready to quit. Patient was advised and educated about tobacco cessation using combination therapy, tobacco cessation classes, quit line, and quit smoking apps. Patient demonstrated understanding of this material. Staff will continue to provide encouragement and follow up with the patient throughout the program. Completed and gym orientation for cardiac rehab. Initial ITP created and sent for review to Dr. Oneil Pinal, Medical Director. 30 Day review completed. Medical Director ITP review done, changes made as directed, and signed approval by Medical Director. First full day of exercise!  Patient was oriented to gym and equipment including functions, settings, policies, and procedures.  Patient's individual exercise prescription and treatment plan were reviewed.  All starting workloads were established based on the results of the 6 minute walk test done at initial orientation visit.  The plan for exercise progression was also introduced and progression will be customized based on patient's performance and goals. 30 Day review completed. Medical Director ITP review done; changes made as directed and signed approval by Medical Director. New to program.      Comments: 30 day review

## 2024-08-27 ENCOUNTER — Ambulatory Visit (INDEPENDENT_AMBULATORY_CARE_PROVIDER_SITE_OTHER)

## 2024-08-27 ENCOUNTER — Encounter

## 2024-08-27 DIAGNOSIS — E538 Deficiency of other specified B group vitamins: Secondary | ICD-10-CM | POA: Diagnosis not present

## 2024-08-27 MED ORDER — CYANOCOBALAMIN 1000 MCG/ML IJ SOLN
1000.0000 ug | Freq: Once | INTRAMUSCULAR | Status: AC
  Administered 2024-08-27: 1000 ug via INTRAMUSCULAR

## 2024-08-28 ENCOUNTER — Encounter

## 2024-08-28 DIAGNOSIS — I213 ST elevation (STEMI) myocardial infarction of unspecified site: Secondary | ICD-10-CM

## 2024-08-28 DIAGNOSIS — I252 Old myocardial infarction: Secondary | ICD-10-CM | POA: Diagnosis not present

## 2024-08-28 DIAGNOSIS — Z955 Presence of coronary angioplasty implant and graft: Secondary | ICD-10-CM

## 2024-08-28 DIAGNOSIS — Z48812 Encounter for surgical aftercare following surgery on the circulatory system: Secondary | ICD-10-CM | POA: Diagnosis not present

## 2024-08-28 NOTE — Progress Notes (Signed)
 Daily Session Note  Patient Details  Name: Joy Carpenter MRN: [id number] Date of Birth: 10/03/1951 Referring Provider:   Flowsheet Row Cardiac Rehab from 07/15/2024 in [facility] Cardiac and Pulmonary Rehab  Referring Provider Dr. Cara Lovelace    Encounter Date: 08/28/2024  Check In:  Session Check In - 08/28/24 1117       Check-In   Supervising physician immediately available to respond to emergencies See telemetry face sheet for immediately available ER MD    Location [facility]-Cardiac & Pulmonary Rehab    Staff Present Hoy Rodney RN,BSN;Joseph Rolinda RCP,RRT,BSRT;Noah Tickle, [place], Exercise Physiologist;Maxon Conetta BS, Exercise Physiologist    Virtual Visit No    Medication changes reported     No    Fall or balance concerns reported    No    Warm-up and Cool-down Performed on first and last piece of equipment    Resistance Training Performed Yes    VAD Patient? No    PAD/SET Patient? No      Pain Assessment   Currently in Pain? No/denies             Social History   Tobacco Use  Smoking Status Every Day   Current packs/day: 1.00   Average packs/day: 1 pack/day for 30.0 years (30.0 ttl pk-yrs)   Types: Cigarettes   Passive exposure: Past  Smokeless Tobacco Never  Tobacco Comments   1 pack daily    Goals Met:  Independence with exercise equipment Exercise tolerated well No report of concerns or symptoms today Strength training completed today  Goals Unmet:  Not Applicable  Comments: Pt able to follow exercise prescription today without complaint.  Will continue to monitor for progression.    Dr. Oneil Pinal is Medical Director for [facility] Cardiac Rehabilitation.  Dr. Fuad Aleskerov is Medical Director for [facility] Pulmonary Rehabilitation.

## 2024-08-31 ENCOUNTER — Encounter

## 2024-08-31 DIAGNOSIS — I213 ST elevation (STEMI) myocardial infarction of unspecified site: Secondary | ICD-10-CM

## 2024-08-31 DIAGNOSIS — I252 Old myocardial infarction: Secondary | ICD-10-CM | POA: Diagnosis not present

## 2024-08-31 DIAGNOSIS — Z955 Presence of coronary angioplasty implant and graft: Secondary | ICD-10-CM | POA: Diagnosis not present

## 2024-08-31 DIAGNOSIS — Z48812 Encounter for surgical aftercare following surgery on the circulatory system: Secondary | ICD-10-CM | POA: Diagnosis not present

## 2024-08-31 NOTE — Progress Notes (Signed)
 Daily Session Note  Patient Details  Name: Joy Carpenter MRN: [id number] Date of Birth: July 16, 1951 Referring Provider:   Flowsheet Row Cardiac Rehab from 07/15/2024 in [facility] Cardiac and Pulmonary Rehab  Referring Provider Dr. Cara Lovelace    Encounter Date: 08/31/2024  Check In:  Session Check In - 08/31/24 1116       Check-In   Supervising physician immediately available to respond to emergencies See telemetry face sheet for immediately available ER MD    Location [facility]-Cardiac & Pulmonary Rehab    Staff Present Burnard Davenport RN,BSN,MPA;Maxon Burnell BS, Exercise Physiologist;Joseph Rolinda RCP,RRT,BSRT;Laura Cates RN,BSN;Alisah Grandberry Carlton BS, ACSM CEP, Exercise Physiologist    Virtual Visit No    Medication changes reported     No    Fall or balance concerns reported    No    Tobacco Cessation No Change    Warm-up and Cool-down Performed on first and last piece of equipment    Resistance Training Performed Yes    VAD Patient? No    PAD/SET Patient? No      Pain Assessment   Currently in Pain? No/denies             Social History   Tobacco Use  Smoking Status Every Day   Current packs/day: 1.00   Average packs/day: 1 pack/day for 30.0 years (30.0 ttl pk-yrs)   Types: Cigarettes   Passive exposure: Past  Smokeless Tobacco Never  Tobacco Comments   1 pack daily    Goals Met:  Independence with exercise equipment Exercise tolerated well No report of concerns or symptoms today Strength training completed today  Goals Unmet:  Not Applicable  Comments: Pt able to follow exercise prescription today without complaint.  Will continue to monitor for progression.    Dr. Oneil Pinal is Medical Director for [facility] Cardiac Rehabilitation.  Dr. Fuad Aleskerov is Medical Director for [facility] Pulmonary Rehabilitation.

## 2024-09-02 ENCOUNTER — Other Ambulatory Visit: Payer: Self-pay

## 2024-09-02 ENCOUNTER — Encounter

## 2024-09-02 ENCOUNTER — Other Ambulatory Visit: Payer: Self-pay | Admitting: Physician Assistant

## 2024-09-02 ENCOUNTER — Encounter: Admitting: Emergency Medicine

## 2024-09-02 DIAGNOSIS — I10 Essential (primary) hypertension: Secondary | ICD-10-CM

## 2024-09-02 DIAGNOSIS — Z955 Presence of coronary angioplasty implant and graft: Secondary | ICD-10-CM

## 2024-09-02 DIAGNOSIS — I252 Old myocardial infarction: Secondary | ICD-10-CM | POA: Diagnosis not present

## 2024-09-02 DIAGNOSIS — E039 Hypothyroidism, unspecified: Secondary | ICD-10-CM

## 2024-09-02 DIAGNOSIS — I213 ST elevation (STEMI) myocardial infarction of unspecified site: Secondary | ICD-10-CM

## 2024-09-02 DIAGNOSIS — Z48812 Encounter for surgical aftercare following surgery on the circulatory system: Secondary | ICD-10-CM | POA: Diagnosis not present

## 2024-09-02 MED ORDER — LEVOTHYROXINE SODIUM 50 MCG PO TABS: ORAL_TABLET | ORAL | 1 refills | Status: AC

## 2024-09-02 NOTE — Progress Notes (Signed)
 Daily Session Note  Patient Details  Name: Joy Carpenter MRN: [id number] Date of Birth: [birth year] Referring Provider:   Flowsheet Row Cardiac Rehab from 07/15/2024 in [facility] Cardiac and Pulmonary Rehab  Referring Provider Dr. Cara Lovelace    Encounter Date: 09/02/2024  Check In:  Session Check In - 09/02/24 1108       Check-In   Supervising physician immediately available to respond to emergencies See telemetry face sheet for immediately available ER MD    Location [facility]-Cardiac & Pulmonary Rehab    Staff Present Maxon Conetta BS, Exercise Physiologist;Allianna Beaubien RN,BSN;Kelly Bollinger RN,BSN,MPA;Meredith Tressa RN,BSN    Virtual Visit No    Medication changes reported     No    Fall or balance concerns reported    No    Tobacco Cessation No Change    Warm-up and Cool-down Performed on first and last piece of equipment    Resistance Training Performed Yes    VAD Patient? No    PAD/SET Patient? No      Pain Assessment   Currently in Pain? No/denies             Social History   Tobacco Use  Smoking Status Every Day   Current packs/day: 1.00   Average packs/day: 1 pack/day for 30.0 years (30.0 ttl pk-yrs)   Types: Cigarettes   Passive exposure: Past  Smokeless Tobacco Never  Tobacco Comments   1 pack daily    Goals Met:  Independence with exercise equipment Exercise tolerated well No report of concerns or symptoms today Strength training completed today  Goals Unmet:  Not Applicable  Comments: Pt able to follow exercise prescription today without complaint.  Will continue to monitor for progression.    Dr. Oneil Pinal is Medical Director for [facility] Cardiac Rehabilitation.  Dr. Fuad Aleskerov is Medical Director for [facility] Pulmonary Rehabilitation.

## 2024-09-07 ENCOUNTER — Encounter

## 2024-09-07 DIAGNOSIS — Z955 Presence of coronary angioplasty implant and graft: Secondary | ICD-10-CM | POA: Diagnosis present

## 2024-09-07 DIAGNOSIS — I213 ST elevation (STEMI) myocardial infarction of unspecified site: Secondary | ICD-10-CM | POA: Insufficient documentation

## 2024-09-07 NOTE — Progress Notes (Signed)
 Daily Session Note  Patient Details  Name: Joy Carpenter MRN: [id number] Date of Birth: 13-Jun-1951 Referring Provider:   Flowsheet Row Cardiac Rehab from 07/15/2024 in [facility] Cardiac and Pulmonary Rehab  Referring Provider Dr. Cara Lovelace    Encounter Date: 09/07/2024  Check In:  Session Check In - 09/07/24 1119       Check-In   Supervising physician immediately available to respond to emergencies See telemetry face sheet for immediately available ER MD    Location [facility]-Cardiac & Pulmonary Rehab    Staff Present Burnard Davenport RN,BSN,MPA;Joseph Rolinda RCP,RRT,BSRT;Laura Cates RN,BSN;Nikolina Simerson Dyane BS, ACSM CEP, Exercise Physiologist    Virtual Visit No    Medication changes reported     No    Fall or balance concerns reported    No    Tobacco Cessation No Change    Warm-up and Cool-down Performed on first and last piece of equipment    Resistance Training Performed Yes    VAD Patient? No    PAD/SET Patient? No      Pain Assessment   Currently in Pain? No/denies             Social History   Tobacco Use  Smoking Status Every Day   Current packs/day: 1.00   Average packs/day: 1 pack/day for 30.0 years (30.0 ttl pk-yrs)   Types: Cigarettes   Passive exposure: Past  Smokeless Tobacco Never  Tobacco Comments   1 pack daily    Goals Met:  Independence with exercise equipment Exercise tolerated well No report of concerns or symptoms today Strength training completed today  Goals Unmet:  Not Applicable  Comments: Pt able to follow exercise prescription today without complaint.  Will continue to monitor for progression.   Reviewed home exercise with pt today.  Pt plans to walk and look into joining a community gym or the [employer] with her silver sneakers benefit for exercise.  Reviewed THR, pulse, RPE, sign and symptoms, pulse oximetery and when to call 911 or MD.  Also discussed weather considerations and indoor options.  Pt voiced  understanding.     Dr. Oneil Pinal is Medical Director for [facility] Cardiac Rehabilitation.  Dr. Fuad Aleskerov is Medical Director for [facility] Pulmonary Rehabilitation.

## 2024-09-09 ENCOUNTER — Encounter

## 2024-09-09 ENCOUNTER — Encounter: Admitting: Emergency Medicine

## 2024-09-09 DIAGNOSIS — Z955 Presence of coronary angioplasty implant and graft: Secondary | ICD-10-CM

## 2024-09-09 DIAGNOSIS — I213 ST elevation (STEMI) myocardial infarction of unspecified site: Secondary | ICD-10-CM

## 2024-09-09 NOTE — Progress Notes (Signed)
 Daily Session Note  Patient Details  Name: Joy Carpenter MRN: [id number] Date of Birth: 07-Jan-1951 Referring Provider:   Flowsheet Row Cardiac Rehab from 07/15/2024 in [facility] Cardiac and Pulmonary Rehab  Referring Provider Dr. Cara Lovelace    Encounter Date: 09/09/2024  Check In:  Session Check In - 09/09/24 1112       Check-In   Supervising physician immediately available to respond to emergencies See telemetry face sheet for immediately available ER MD    Location [facility]-Cardiac & Pulmonary Rehab    Staff Present Leita Franks RN,BSN;Joseph [facility] BS, Exercise Physiologist;Denise Rhew PhD, RN,CNS,CEN    Virtual Visit No    Medication changes reported     No    Fall or balance concerns reported    No    Tobacco Cessation No Change    Warm-up and Cool-down Performed on first and last piece of equipment    Resistance Training Performed Yes    VAD Patient? No    PAD/SET Patient? No      Pain Assessment   Currently in Pain? No/denies             Social History   Tobacco Use  Smoking Status Every Day   Current packs/day: 1.00   Average packs/day: 1 pack/day for 30.0 years (30.0 ttl pk-yrs)   Types: Cigarettes   Passive exposure: Past  Smokeless Tobacco Never  Tobacco Comments   1 pack daily    Goals Met:  Independence with exercise equipment Exercise tolerated well No report of concerns or symptoms today Strength training completed today  Goals Unmet:  Not Applicable  Comments: Pt able to follow exercise prescription today without complaint.  Will continue to monitor for progression.    Dr. Oneil Pinal is Medical Director for [facility] Cardiac Rehabilitation.  Dr. Fuad Aleskerov is Medical Director for [facility] Pulmonary Rehabilitation.

## 2024-09-10 ENCOUNTER — Encounter

## 2024-09-10 ENCOUNTER — Other Ambulatory Visit: Payer: Self-pay | Admitting: Physician Assistant

## 2024-09-10 DIAGNOSIS — R1012 Left upper quadrant pain: Secondary | ICD-10-CM

## 2024-09-14 ENCOUNTER — Encounter

## 2024-09-16 ENCOUNTER — Encounter

## 2024-09-16 DIAGNOSIS — Z955 Presence of coronary angioplasty implant and graft: Secondary | ICD-10-CM

## 2024-09-16 DIAGNOSIS — I213 ST elevation (STEMI) myocardial infarction of unspecified site: Secondary | ICD-10-CM

## 2024-09-16 NOTE — Progress Notes (Signed)
 Daily Session Note  Patient Details  Name: Joy Carpenter MRN: [id number] Date of Birth: 1950-11-28 Referring Provider:   Flowsheet Row Cardiac Rehab from 07/15/2024 in [facility] Cardiac and Pulmonary Rehab  Referring Provider Dr. Cara Lovelace    Encounter Date: 09/16/2024  Check In:  Session Check In - 09/16/24 1028       Check-In   Supervising physician immediately available to respond to emergencies See telemetry face sheet for immediately available ER MD    Location [facility]-Cardiac & Pulmonary Rehab    Staff Present Burnard Davenport RN,BSN,MPA;Joseph Rolinda RCP,RRT,BSRT;Laura Cates RN,BSN;Meredith Tressa RN,BSN    Virtual Visit No    Medication changes reported     No    Fall or balance concerns reported    No    Tobacco Cessation No Change    Warm-up and Cool-down Performed on first and last piece of equipment    Resistance Training Performed Yes    VAD Patient? No    PAD/SET Patient? No      Pain Assessment   Currently in Pain? No/denies             Social History   Tobacco Use  Smoking Status Every Day   Current packs/day: 1.00   Average packs/day: 1 pack/day for 30.0 years (30.0 ttl pk-yrs)   Types: Cigarettes   Passive exposure: Past  Smokeless Tobacco Never  Tobacco Comments   1 pack daily    Goals Met:  Independence with exercise equipment Exercise tolerated well No report of concerns or symptoms today Strength training completed today  Goals Unmet:  Not Applicable  Comments: Pt able to follow exercise prescription today without complaint.  Will continue to monitor for progression.    Dr. Oneil Pinal is Medical Director for [facility] Cardiac Rehabilitation.  Dr. Fuad Aleskerov is Medical Director for [facility] Pulmonary Rehabilitation.

## 2024-09-17 ENCOUNTER — Encounter

## 2024-09-18 ENCOUNTER — Encounter

## 2024-09-18 DIAGNOSIS — I213 ST elevation (STEMI) myocardial infarction of unspecified site: Secondary | ICD-10-CM | POA: Diagnosis not present

## 2024-09-18 DIAGNOSIS — Z955 Presence of coronary angioplasty implant and graft: Secondary | ICD-10-CM

## 2024-09-18 NOTE — Progress Notes (Signed)
 Daily Session Note  Patient Details  Name: Joy Carpenter MRN: [id number] Date of Birth: 1950-11-21 Referring Provider:   Flowsheet Row Cardiac Rehab from 07/15/2024 in [facility] Cardiac and Pulmonary Rehab  Referring Provider Dr. Cara Lovelace    Encounter Date: 09/18/2024  Check In:  Session Check In - 09/18/24 1129       Check-In   Supervising physician immediately available to respond to emergencies See telemetry face sheet for immediately available ER MD    Location [facility]-Cardiac & Pulmonary Rehab    Staff Present Leita Franks RN,BSN;Joseph [facility] BS, Exercise Physiologist;Noah Tickle, BS, Exercise Physiologist    Virtual Visit No    Medication changes reported     No    Fall or balance concerns reported    No    Tobacco Cessation No Change    Warm-up and Cool-down Performed on first and last piece of equipment    Resistance Training Performed Yes    VAD Patient? No    PAD/SET Patient? No      Pain Assessment   Currently in Pain? No/denies             Tobacco Use History[1]  Goals Met:  Independence with exercise equipment Exercise tolerated well No report of concerns or symptoms today Strength training completed today  Goals Unmet:  Not Applicable  Comments: Pt able to follow exercise prescription today without complaint.  Will continue to monitor for progression.    Dr. Oneil Pinal is Medical Director for [facility] Cardiac Rehabilitation.  Dr. Fuad Aleskerov is Medical Director for [facility] Pulmonary Rehabilitation.    [1]  Social History Tobacco Use  Smoking Status Every Day   Current packs/day: 1.00   Average packs/day: 1 pack/day for 30.0 years (30.0 ttl pk-yrs)   Types: Cigarettes   Passive exposure: Past  Smokeless Tobacco Never  Tobacco Comments   1 pack daily

## 2024-09-21 ENCOUNTER — Encounter

## 2024-09-21 DIAGNOSIS — Z955 Presence of coronary angioplasty implant and graft: Secondary | ICD-10-CM

## 2024-09-21 DIAGNOSIS — I213 ST elevation (STEMI) myocardial infarction of unspecified site: Secondary | ICD-10-CM | POA: Diagnosis not present

## 2024-09-21 NOTE — Progress Notes (Signed)
 Daily Session Note  Patient Details  Name: Joy Carpenter MRN: [id number] Date of Birth: Jan 17, 1951 Referring Provider:   Flowsheet Row Cardiac Rehab from 07/15/2024 in [facility] Cardiac and Pulmonary Rehab  Referring Provider Dr. Cara Lovelace    Encounter Date: 09/21/2024  Check In:  Session Check In - 09/21/24 1110       Check-In   Supervising physician immediately available to respond to emergencies See telemetry face sheet for immediately available ER MD    Location [facility]-Cardiac & Pulmonary Rehab    Staff Present Burnard Davenport RN,BSN,MPA;Joseph Rolinda RCP,RRT,BSRT;Laura Cates RN,BSN;Kynslee Baham Dyane BS, ACSM CEP, Exercise Physiologist    Virtual Visit No    Medication changes reported     No    Fall or balance concerns reported    No    Tobacco Cessation No Change    Warm-up and Cool-down Performed on first and last piece of equipment    Resistance Training Performed Yes    VAD Patient? No    PAD/SET Patient? No      Pain Assessment   Currently in Pain? No/denies             Tobacco Use History[1]  Goals Met:  Independence with exercise equipment Exercise tolerated well No report of concerns or symptoms today Strength training completed today  Goals Unmet:  Not Applicable  Comments: Pt able to follow exercise prescription today without complaint.  Will continue to monitor for progression.    Dr. Oneil Pinal is Medical Director for [facility] Cardiac Rehabilitation.  Dr. Fuad Aleskerov is Medical Director for [facility] Pulmonary Rehabilitation.    [1]  Social History Tobacco Use  Smoking Status Every Day   Current packs/day: 1.00   Average packs/day: 1 pack/day for 30.0 years (30.0 ttl pk-yrs)   Types: Cigarettes   Passive exposure: Past  Smokeless Tobacco Never  Tobacco Comments   1 pack daily

## 2024-09-23 ENCOUNTER — Encounter

## 2024-09-23 DIAGNOSIS — I213 ST elevation (STEMI) myocardial infarction of unspecified site: Secondary | ICD-10-CM

## 2024-09-23 DIAGNOSIS — Z955 Presence of coronary angioplasty implant and graft: Secondary | ICD-10-CM

## 2024-09-23 NOTE — Progress Notes (Signed)
 Cardiac Individual Treatment Plan  Patient Details  Name: Joy Carpenter MRN: [id number] Date of Birth: [birth year] Referring Provider:   Flowsheet Row Cardiac Rehab from 07/15/2024 in [facility] Cardiac and Pulmonary Rehab  Referring Provider Dr. Cara Lovelace    Initial Encounter Date:  Flowsheet Row Cardiac Rehab from 07/15/2024 in [facility] Cardiac and Pulmonary Rehab  Date 07/15/24    Visit Diagnosis: ST elevation myocardial infarction (STEMI), unspecified artery (HCC)  Patient's Home Medications on Admission: Current Medications[1]  Past Medical History: Past Medical History:  Diagnosis Date   Acquired hypothyroidism    Actinic keratosis 03/28/2021   right upper arm, bx proven   Anxiety    a.) on BZO (alproazolam) PRN   Carpal tunnel syndrome 2019   Coronary artery disease involving native coronary artery    of the LAD and RCA noted on chest CT   Depression    Diastolic dysfunction    Emphysema of lung (HCC)    GERD (gastroesophageal reflux disease)    Herpes    History of hiatal hernia    Hypertensive chronic kidney disease    Insomnia    a.) on hypnotic (zolpidem ) PRN   Long-term use of aspirin  therapy    Mixed hyperlipidemia    Osteoporosis    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   Primary hypertension    Pulmonary nodules    SCC (squamous cell carcinoma) 03/28/2021   left dorsal hand, EDC   SCC (squamous cell carcinoma) 08/01/2023   SCC IS, L ant shoulder, EDC 08/27/2023   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   left anterior shoulder, EDC 08/27/23   Stress headaches (almost daily)    Thoracoabdominal aortic aneurysm    a.) s/p 4v FEVAR (CA, SMA, RRA, LRA) and proximal TEVAR extention with BILATERAL iliac limb extensions on 11/16/2020   Vertigo 2010   Vitamin D  deficiency    Weakness of both legs (intermittent)     Tobacco Use: Tobacco Use History[2]  Labs: Review Flowsheet  More data exists      Latest Ref Rng & Units 09/14/2021 07/02/2022  04/22/2023 06/22/2024 08/11/2024  Labs for ITP Cardiac and Pulmonary Rehab  Cholestrol 100 - 199 mg/dL 800  821  864  861  852   LDL (calc) 0 - 99 mg/dL 872  891  73  58  70   HDL-C >39 mg/dL 54  58  53  66  65   Trlycerides 0 - 149 mg/dL 898  64  46  70  60   Hemoglobin A1c 4.8 - 5.6 % - - - 5.6  -     Exercise Target Goals: Exercise Program Goal: Individual exercise prescription set using results from initial 6 min walk test and THRR while considering  patients activity barriers and safety.   Exercise Prescription Goal: Initial exercise prescription builds to 30-45 minutes a day of aerobic activity, 2-3 days per week.  Home exercise guidelines will be given to patient during program as part of exercise prescription that the participant will acknowledge.   Education: Aerobic Exercise: - Group verbal and visual presentation on the components of exercise prescription. Introduces F.I.T.T principle from ACSM for exercise prescriptions.  Reviews F.I.T.T. principles of aerobic exercise including progression. Written material provided at class time. Flowsheet Row Cardiac Rehab from 09/16/2024 in [facility] Cardiac and Pulmonary Rehab  Education need identified 07/15/24    Education: Resistance Exercise: - Group verbal and visual presentation on the components of exercise prescription. Introduces F.I.T.T principle from ACSM for  exercise prescriptions  Reviews F.I.T.T. principles of resistance exercise including progression. Written material provided at class time.    Education: Exercise & Equipment Safety: - Individual verbal instruction and demonstration of equipment use and safety with use of the equipment. Flowsheet Row Cardiac Rehab from 09/16/2024 in [facility] Cardiac and Pulmonary Rehab  Date 07/15/24  Educator [facility]  Instruction Review Code 1- Verbalizes Understanding    Education: Exercise Physiology & General Exercise Guidelines: - Group verbal and written instruction with models to review the  exercise physiology of the cardiovascular system and associated critical values. Provides general exercise guidelines with specific guidelines to those with heart or lung disease. Written material provided at class time. Flowsheet Row Cardiac Rehab from 09/16/2024 in [facility] Cardiac and Pulmonary Rehab  Date 09/16/24  Educator nt  Instruction Review Code 1- Tefl Teacher Understanding    Education: Flexibility, Balance, Mind/Body Relaxation: - Group verbal and visual presentation with interactive activity on the components of exercise prescription. Introduces F.I.T.T principle from ACSM for exercise prescriptions. Reviews F.I.T.T. principles of flexibility and balance exercise training including progression. Also discusses the mind body connection.  Reviews various relaxation techniques to help reduce and manage stress (i.e. Deep breathing, progressive muscle relaxation, and visualization). Balance handout provided to take home. Written material provided at class time.   Activity Barriers & Risk Stratification:  Activity Barriers & Cardiac Risk Stratification - 07/15/24 1530       Activity Barriers & Cardiac Risk Stratification   Activity Barriers Back Problems    Cardiac Risk Stratification High          6 Minute Walk:  6 Minute Walk     Row Name 07/15/24 1528         6 Minute Walk   Phase Initial     Distance 1205 feet     Walk Time 6 minutes     # of Rest Breaks 0     MPH 2.3     METS 3.03     RPE 8     Perceived Dyspnea  0     VO2 Peak 10.6     Symptoms Yes (comment)     Comments Left calf cramp     Resting HR 80 bpm     Resting BP 116/64     Resting Oxygen Saturation  98 %     Exercise Oxygen Saturation  during 6 min walk 99 %     Max Ex. HR 103 bpm     Max Ex. BP 124/70     2 Minute Post BP 112/68        Oxygen Initial Assessment:   Oxygen Re-Evaluation:   Oxygen Discharge (Final Oxygen Re-Evaluation):   Initial Exercise Prescription:  Initial Exercise  Prescription - 07/15/24 1500       Date of Initial Exercise RX and Referring Provider   Date 07/15/24    Referring Provider Dr. Cara Lovelace      Oxygen   Maintain Oxygen Saturation 88% or higher      Treadmill   MPH 2.5    Grade 0    Minutes 15    METs 2.91      NuStep   Level 3    SPM 80    Minutes 15    METs 3      REL-XR   Level 3    Watts 25    Speed 50    Minutes 15    METs 3      T5  Nustep   Level 3    SPM 80    Minutes 15    METs 3      Biostep-RELP   Level 3    SPM 80    Minutes 15    METs 3      Rower   Level 7    Watts 25    Minutes 15    METs 3      Prescription Details   Duration Progress to 30 minutes of continuous aerobic without signs/symptoms of physical distress      Intensity   THRR 40-80% of Max Heartrate 107-135    Ratings of Perceived Exertion 11-13    Perceived Dyspnea 0-4      Progression   Progression Continue to progress workloads to maintain intensity without signs/symptoms of physical distress.      Resistance Training   Training Prescription Yes    Weight 4lb    Reps 10-15          Perform Capillary Blood Glucose checks as needed.  Exercise Prescription Changes:   Exercise Prescription Changes     Row Name 07/15/24 1500 08/12/24 1300 08/25/24 1400 09/07/24 1200 09/10/24 1000     Response to Exercise   Blood Pressure (Admit) 116/64 102/62 102/62 -- 130/68   Blood Pressure (Exercise) 124/70 100/60 152/72 -- 114/62   Blood Pressure (Exit) 112/68 98/54 112/62 -- 114/68   Heart Rate (Admit) 80 bpm 88 bpm 80 bpm -- 89 bpm   Heart Rate (Exercise) 103 bpm 124 bpm 116 bpm -- 132 bpm   Heart Rate (Exit) 76 bpm 98 bpm 93 bpm -- 101 bpm   Oxygen Saturation (Admit) 98 % -- -- -- --   Oxygen Saturation (Exercise) 99 % -- -- -- --   Oxygen Saturation (Exit) 99 % -- -- -- --   Rating of Perceived Exertion (Exercise) 8 12 13  -- 13   Perceived Dyspnea (Exercise) 0 -- -- -- --   Symptoms left calf cramp none none --  none   Comments results 1st day -- -- --   Duration -- Progress to 30 minutes of  aerobic without signs/symptoms of physical distress Continue with 30 min of aerobic exercise without signs/symptoms of physical distress. Continue with 30 min of aerobic exercise without signs/symptoms of physical distress. Continue with 30 min of aerobic exercise without signs/symptoms of physical distress.   Intensity -- THRR unchanged THRR unchanged THRR unchanged THRR unchanged     Progression   Progression -- Continue to progress workloads to maintain intensity without signs/symptoms of physical distress. Continue to progress workloads to maintain intensity without signs/symptoms of physical distress. Continue to progress workloads to maintain intensity without signs/symptoms of physical distress. Continue to progress workloads to maintain intensity without signs/symptoms of physical distress.   Average METs -- 2.1 2.95 2.95 2.92     Resistance Training   Training Prescription -- Yes Yes Yes Yes   Weight -- 4lb 4 lb 4 lb 4 lb   Reps -- 10-15 10-15 10-15 10-15     Interval Training   Interval Training -- No No No No     Treadmill   MPH -- 1.7 2.2 2.2 2.3   Grade -- 0 1 1 0   Minutes -- 15 15 15 15    METs -- 2.3 2.99 2.99 2.76     NuStep   Level -- 2  T6 Nustep 3 3 4    Minutes -- 15 15 15  15  METs -- 1.9 2.9 2.9 3     REL-XR   Level -- -- 4 4 4    Minutes -- -- 15 15 15    METs -- -- 3.7 3.7 4.1     T5 Nustep   Level -- -- -- -- 3   Minutes -- -- -- -- 15   METs -- -- -- -- 2.3     Home Exercise Plan   Plans to continue exercise at -- -- -- Home (comment)  walking and looking into a community gym or wellzone. Home (comment)  walking and looking into a community gym or wellzone.   Frequency -- -- -- Add 2 additional days to program exercise sessions. Add 2 additional days to program exercise sessions.   Initial Home Exercises Provided -- -- -- 09/07/24 09/07/24     Oxygen   Maintain  Oxygen Saturation -- 88% or higher 88% or higher 88% or higher 88% or higher      Exercise Comments:   Exercise Comments     Row Name 08/05/24 1438           Exercise Comments First full day of exercise!  Patient was oriented to gym and equipment including functions, settings, policies, and procedures.  Patient's individual exercise prescription and treatment plan were reviewed.  All starting workloads were established based on the results of the 6 minute walk test done at initial orientation visit.  The plan for exercise progression was also introduced and progression will be customized based on patient's performance and goals.          Exercise Goals and Review:   Exercise Goals     Row Name 07/15/24 1533             Exercise Goals   Increase Physical Activity Yes       Intervention Develop an individualized exercise prescription for aerobic and resistive training based on initial evaluation findings, risk stratification, comorbidities and participant's personal goals.;Provide advice, education, support and counseling about physical activity/exercise needs.       Expected Outcomes Short Term: Attend rehab on a regular basis to increase amount of physical activity.;Long Term: Add in home exercise to make exercise part of routine and to increase amount of physical activity.;Long Term: Exercising regularly at least 3-5 days a week.       Increase Strength and Stamina Yes       Intervention Provide advice, education, support and counseling about physical activity/exercise needs.;Develop an individualized exercise prescription for aerobic and resistive training based on initial evaluation findings, risk stratification, comorbidities and participant's personal goals.       Expected Outcomes Short Term: Increase workloads from initial exercise prescription for resistance, speed, and METs.;Short Term: Perform resistance training exercises routinely during rehab and add in resistance  training at home;Long Term: Improve cardiorespiratory fitness, muscular endurance and strength as measured by increased METs and functional capacity ( )       Able to understand and use rate of perceived exertion (RPE) scale Yes       Intervention Provide education and explanation on how to use RPE scale       Expected Outcomes Short Term: Able to use RPE daily in rehab to express subjective intensity level;Long Term:  Able to use RPE to guide intensity level when exercising independently       Able to understand and use Dyspnea scale Yes       Intervention Provide education and explanation on how to use Dyspnea scale  Expected Outcomes Short Term: Able to use Dyspnea scale daily in rehab to express subjective sense of shortness of breath during exertion;Long Term: Able to use Dyspnea scale to guide intensity level when exercising independently       Knowledge and understanding of Target Heart Rate Range (THRR) Yes       Intervention Provide education and explanation of THRR including how the numbers were predicted and where they are located for reference       Expected Outcomes Short Term: Able to state/look up THRR;Short Term: Able to use daily as guideline for intensity in rehab;Long Term: Able to use THRR to govern intensity when exercising independently       Able to check pulse independently Yes       Intervention Provide education and demonstration on how to check pulse in carotid and radial arteries.;Review the importance of being able to check your own pulse for safety during independent exercise       Expected Outcomes Short Term: Able to explain why pulse checking is important during independent exercise;Long Term: Able to check pulse independently and accurately       Understanding of Exercise Prescription Yes       Intervention Provide education, explanation, and written materials on patient's individual exercise prescription       Expected Outcomes Short Term: Able to explain  program exercise prescription;Long Term: Able to explain home exercise prescription to exercise independently          Exercise Goals Re-Evaluation :  Exercise Goals Re-Evaluation     Row Name 08/05/24 1438 08/12/24 1322 08/25/24 1500 09/07/24 1206 09/10/24 1041     Exercise Goal Re-Evaluation   Exercise Goals Review Increase Physical Activity;Able to understand and use rate of perceived exertion (RPE) scale;Knowledge and understanding of Target Heart Rate Range (THRR);Understanding of Exercise Prescription;Increase Strength and Stamina;Able to understand and use Dyspnea scale;Able to check pulse independently Increase Physical Activity;Understanding of Exercise Prescription;Increase Strength and Stamina Increase Physical Activity;Understanding of Exercise Prescription;Increase Strength and Stamina Increase Physical Activity;Able to understand and use rate of perceived exertion (RPE) scale;Knowledge and understanding of Target Heart Rate Range (THRR);Understanding of Exercise Prescription;Increase Strength and Stamina;Able to understand and use Dyspnea scale;Able to check pulse independently Increase Physical Activity;Increase Strength and Stamina;Understanding of Exercise Prescription   Comments Reviewed RPE and dyspnea scale, THR and program prescription with pt today.  Pt voiced understanding and was given a copy of goals to take home. Brailey is off to a good start in the program and she completed her first day of exercise in this review. She was able to walk at a speed of 1.7 mph on the treadmill. She also worked at level 2 on the T6 nustep. We will continue to monitor her progress in the program. Oberia is doing well in rehab. She recently increased her treadmill workload to a speed of 2.2 mph with an incline of 1%. She also improved to level 3 on the T4 nustep and level 4 on the XR. We will continue to monitor her progress in the program. Reviewed home exercise with pt today.  Pt plans to walk and  look into joining a community gym or the wellzone with her silver sneakers benefit for exercise.  Reviewed THR, pulse, RPE, sign and symptoms, pulse oximetery and when to call 911 or MD.  Also discussed weather considerations and indoor options.  Pt voiced understanding. Roda continues to do well in rehab. She was recently able to increase her treadmill speed  form 2.2 to 2.3 mph with no incline. She was also able to increase from levele 3 to 4 on the T4 nustep. We will continue to monitor her progress in the program.   Expected Outcomes Short: Use RPE daily to regulate intensity. Long: Follow program prescription in THR. Short: Continue to follow current exercise prescription. Long: Continue exercise to improve strength and stamina. Short: Continue to progressively increase workloads. Long: Continue exercise to improve strength and stamina. Short: add 1-2 days a week of exercise on off days of rehab. Long: maintain independent exercise routine upon graduation from cardiac rehab. Short: Continue to increase treadmill workloads. Long: Continue exercise to improve strength and stamina.      Discharge Exercise Prescription (Final Exercise Prescription Changes):  Exercise Prescription Changes - 09/10/24 1000       Response to Exercise   Blood Pressure (Admit) 130/68    Blood Pressure (Exercise) 114/62    Blood Pressure (Exit) 114/68    Heart Rate (Admit) 89 bpm    Heart Rate (Exercise) 132 bpm    Heart Rate (Exit) 101 bpm    Rating of Perceived Exertion (Exercise) 13    Symptoms none    Duration Continue with 30 min of aerobic exercise without signs/symptoms of physical distress.    Intensity THRR unchanged      Progression   Progression Continue to progress workloads to maintain intensity without signs/symptoms of physical distress.    Average METs 2.92      Resistance Training   Training Prescription Yes    Weight 4 lb    Reps 10-15      Interval Training   Interval Training No       Treadmill   MPH 2.3    Grade 0    Minutes 15    METs 2.76      NuStep   Level 4    Minutes 15    METs 3      REL-XR   Level 4    Minutes 15    METs 4.1      T5 Nustep   Level 3    Minutes 15    METs 2.3      Home Exercise Plan   Plans to continue exercise at Home (comment)   walking and looking into a community gym or wellzone.   Frequency Add 2 additional days to program exercise sessions.    Initial Home Exercises Provided 09/07/24      Oxygen   Maintain Oxygen Saturation 88% or higher          Nutrition:  Target Goals: Understanding of nutrition guidelines, daily intake of sodium 1500mg , cholesterol 200mg , calories 30% from fat and 7% or less from saturated fats, daily to have 5 or more servings of fruits and vegetables.  Education: Nutrition 1 -Group instruction provided by verbal, written material, interactive activities, discussions, models, and posters to present general guidelines for heart healthy nutrition including macronutrients, label reading, and promoting whole foods over processed counterparts. Education serves as pensions consultant of discussion of heart healthy eating for all. Written material provided at class time.    Education: Nutrition 2 -Group instruction provided by verbal, written material, interactive activities, discussions, models, and posters to present general guidelines for heart healthy nutrition including sodium, cholesterol, and saturated fat. Providing guidance of habit forming to improve blood pressure, cholesterol, and body weight. Written material provided at class time. Flowsheet Row Cardiac Rehab from 09/16/2024 in [facility] Cardiac and Pulmonary Rehab  Date  08/05/24  Educator jg  Instruction Review Code 1- Verbalizes Understanding      Biometrics:  Pre Biometrics - 07/15/24 1533       Pre Biometrics   Height 5' 6.3 (1.684 m)    Weight 128 lb 6.4 oz (58.2 kg)    Waist Circumference 31 inches    Hip Circumference 38 inches     Waist to Hip Ratio 0.82 %    BMI (Calculated) 20.54    Single Leg Stand 30 seconds           Nutrition Therapy Plan and Nutrition Goals:   Nutrition Assessments:  MEDIFICTS Score Key: >=70 Need to make dietary changes  40-70 Heart Healthy Diet <= 40 Therapeutic Level Cholesterol Diet  Flowsheet Row Cardiac Rehab from 07/15/2024 in [facility] Cardiac and Pulmonary Rehab  Picture Your Plate Total Score on Admission 57   Picture Your Plate Scores: <59 Unhealthy dietary pattern with much room for improvement. 41-50 Dietary pattern unlikely to meet recommendations for good health and room for improvement. 51-60 More healthful dietary pattern, with some room for improvement.  >60 Healthy dietary pattern, although there may be some specific behaviors that could be improved.    Nutrition Goals Re-Evaluation:   Nutrition Goals Discharge (Final Nutrition Goals Re-Evaluation):   Psychosocial: Target Goals: Acknowledge presence or absence of significant depression and/or stress, maximize coping skills, provide positive support system. Participant is able to verbalize types and ability to use techniques and skills needed for reducing stress and depression.   Education: Stress, Anxiety, and Depression - Group verbal and visual presentation to define topics covered.  Reviews how body is impacted by stress, anxiety, and depression.  Also discusses healthy ways to reduce stress and to treat/manage anxiety and depression. Written material provided at class time. Flowsheet Row Cardiac Rehab from 09/16/2024 in [facility] Cardiac and Pulmonary Rehab  Date 09/09/24  Educator kb  Instruction Review Code 1- [employer] Understanding    Education: Sleep Hygiene -Provides group verbal and written instruction about how sleep can affect your health.  Define sleep hygiene, discuss sleep cycles and impact of sleep habits. Review good sleep hygiene tips.   Initial Review & Psychosocial Screening:  Initial  Psych Review & Screening - 07/10/24 0946       Initial Review   Current issues with Current Sleep Concerns;Current Anxiety/Panic;Current Psychotropic Meds      Family Dynamics   Good Support System? Yes      Screening Interventions   Interventions Encouraged to exercise    Expected Outcomes Short Term goal: Utilizing psychosocial counselor, staff and physician to assist with identification of specific Stressors or current issues interfering with healing process. Setting desired goal for each stressor or current issue identified.;Long Term Goal: Stressors or current issues are controlled or eliminated.;Short Term goal: Identification and review with participant of any Quality of Life or Depression concerns found by scoring the questionnaire.;Long Term goal: The participant improves quality of Life and PHQ9 Scores as seen by post scores and/or verbalization of changes          Quality of Life Scores:   Quality of Life - 07/15/24 1534       Quality of Life   Select Quality of Life      Quality of Life Scores   Health/Function Pre 24.6 %    Socioeconomic Pre 26.25 %    Psych/Spiritual Pre 28.79 %    Family Pre 19.8 %    GLOBAL Pre 25.13 %  Scores of 19 and below usually indicate a poorer quality of life in these areas.  A difference of  2-3 points is a clinically meaningful difference.  A difference of 2-3 points in the total score of the Quality of Life Index has been associated with significant improvement in overall quality of life, self-image, physical symptoms, and general health in studies assessing change in quality of life.  PHQ-9: Review Flowsheet  More data exists      07/15/2024 08/01/2023 04/22/2023 12/18/2021 06/19/2021  Depression screen PHQ 2/9  Decreased Interest 0 0 0 0 0  Down, Depressed, Hopeless 0 0 0 0 0  PHQ - 2 Score 0 0 0 0 0  Altered sleeping 1 - - - -  Tired, decreased energy 1 - - - -  Change in appetite 0 - - - -  Feeling bad or failure about  yourself  0 - - - -  Trouble concentrating 0 - - - -  Moving slowly or fidgety/restless 0 - - - -  Suicidal thoughts 0 - - - -  PHQ-9 Score 2  - - - -  Difficult doing work/chores Not difficult at all - - - -    Details       Data saved with a previous flowsheet row definition        Interpretation of Total Score  Total Score Depression Severity:  1-4 = Minimal depression, 5-9 = Mild depression, 10-14 = Moderate depression, 15-19 = Moderately severe depression, 20-27 = Severe depression   Psychosocial Evaluation and Intervention:  Psychosocial Evaluation - 07/10/24 1000       Psychosocial Evaluation & Interventions   Interventions Encouraged to exercise with the program and follow exercise prescription;Stress management education;Relaxation education    Comments Avalon is coming to cardiac rehab post STEMI w/ stent. Her wrist is still very bruised and painful, so she is following up weekly with her cardiologist. She mentions it is causing sleep concerns on top of her already problems with staying asleep. She is trying to get in touch with one of her doctors who prescribed some sleep medicine so she can get a refill. She is a current smoker and is interested in quitting. She mentions in December she lost her fiance to lung and brain cancer, so her stress level has been up and smoking is a comfort to her. She has returned to her job at [employer] and is ready to get started in the program to learn more about heart healthy living.    Expected Outcomes Short: attend cardiac rehab for education and exercise Long: develop and maintain positive self care habits    Continue Psychosocial Services  Follow up required by staff          Psychosocial Re-Evaluation:   Psychosocial Discharge (Final Psychosocial Re-Evaluation):   Vocational Rehabilitation: Provide vocational rehab assistance to qualifying candidates.   Vocational Rehab Evaluation & Intervention:  Vocational Rehab - 07/10/24  0953       Initial Vocational Rehab Evaluation & Intervention   Assessment shows need for Vocational Rehabilitation No          Education: Education Goals: Education classes will be provided on a variety of topics geared toward better understanding of heart health and risk factor modification. Participant will state understanding/return demonstration of topics presented as noted by education test scores.  Learning Barriers/Preferences:  Learning Barriers/Preferences - 07/10/24 9047       Learning Barriers/Preferences   Learning Barriers None  Learning Preferences None          General Cardiac Education Topics:  AED/CPR: - Group verbal and written instruction with the use of models to demonstrate the basic use of the AED with the basic ABC's of resuscitation.   Test and Procedures: - Group verbal and visual presentation and models provide information about basic cardiac anatomy and function. Reviews the testing methods done to diagnose heart disease and the outcomes of the test results. Describes the treatment choices: Medical Management, Angioplasty, or Coronary Bypass Surgery for treating various heart conditions including Myocardial Infarction, Angina, Valve Disease, and Cardiac Arrhythmias. Written material provided at class time. Flowsheet Row Cardiac Rehab from 09/16/2024 in [facility] Cardiac and Pulmonary Rehab  Date 08/12/24  Educator lc  Instruction Review Code 1- Verbalizes Understanding    Medication Safety: - Group verbal and visual instruction to review commonly prescribed medications for heart and lung disease. Reviews the medication, class of the drug, and side effects. Includes the steps to properly store meds and maintain the prescription regimen. Written material provided at class time. Flowsheet Row Cardiac Rehab from 09/16/2024 in [facility] Cardiac and Pulmonary Rehab  Date 08/19/24  Educator kb  Instruction Review Code 1- Verbalizes Understanding     Intimacy: - Group verbal instruction through game format to discuss how heart and lung disease can affect sexual intimacy. Written material provided at class time.   Know Your Numbers and Heart Failure: - Group verbal and visual instruction to discuss disease risk factors for cardiac and pulmonary disease and treatment options.  Reviews associated critical values for Overweight/Obesity, Hypertension, Cholesterol, and Diabetes.  Discusses basics of heart failure: signs/symptoms and treatments.  Introduces Heart Failure Zone chart for action plan for heart failure. Written material provided at class time. Flowsheet Row Cardiac Rehab from 09/16/2024 in [facility] Cardiac and Pulmonary Rehab  Education need identified 07/15/24  Date 09/02/24  Educator KB  Instruction Review Code 1- Verbalizes Understanding    Infection Prevention: - Provides verbal and written material to individual with discussion of infection control including proper hand washing and proper equipment cleaning during exercise session. Flowsheet Row Cardiac Rehab from 09/16/2024 in [facility] Cardiac and Pulmonary Rehab  Date 07/15/24  Educator [facility]  Instruction Review Code 1- Verbalizes Understanding    Falls Prevention: - Provides verbal and written material to individual with discussion of falls prevention and safety. Flowsheet Row Cardiac Rehab from 09/16/2024 in [facility] Cardiac and Pulmonary Rehab  Date 07/15/24  Educator [facility]  Instruction Review Code 1- Verbalizes Understanding    Other: -Provides group and verbal instruction on various topics (see comments)   Knowledge Questionnaire Score:  Knowledge Questionnaire Score - 07/15/24 1535       Knowledge Questionnaire Score   Pre Score 22/26          Core Components/Risk Factors/Patient Goals at Admission:  Personal Goals and Risk Factors at Admission - 07/10/24 0951       Core Components/Risk Factors/Patient Goals on Admission    Weight Management Yes;Weight  Maintenance    Intervention Weight Management: Develop a combined nutrition and exercise program designed to reach desired caloric intake, while maintaining appropriate intake of nutrient and fiber, sodium and fats, and appropriate energy expenditure required for the weight goal.;Weight Management: Provide education and appropriate resources to help participant work on and attain dietary goals.    Admit Weight 125 lb (56.7 kg)    Expected Outcomes Short Term: Continue to assess and modify interventions until short term weight is  achieved;Long Term: Adherence to nutrition and physical activity/exercise program aimed toward attainment of established weight goal;Weight Maintenance: Understanding of the daily nutrition guidelines, which includes 25-35% calories from fat, 7% or less cal from saturated fats, less than 200mg  cholesterol, less than 1.5gm of sodium, & 5 or more servings of fruits and vegetables daily;Understanding recommendations for meals to include 15-35% energy as protein, 25-35% energy from fat, 35-60% energy from carbohydrates, less than 200mg  of dietary cholesterol, 20-35 gm of total fiber daily;Understanding of distribution of calorie intake throughout the day with the consumption of 4-5 meals/snacks    Tobacco Cessation Yes    Intervention Assist the participant in steps to quit. Provide individualized education and counseling about committing to Tobacco Cessation, relapse prevention, and pharmacological support that can be provided by physician.;Education officer, environmental, assist with locating and accessing local/national Quit Smoking programs, and support quit date choice.    Expected Outcomes Short Term: Will demonstrate readiness to quit, by selecting a quit date.;Short Term: Will quit all tobacco product use, adhering to prevention of relapse plan.;Long Term: Complete abstinence from all tobacco products for at least 12 months from quit date.    Hypertension Yes    Intervention  Provide education on lifestyle modifcations including regular physical activity/exercise, weight management, moderate sodium restriction and increased consumption of fresh fruit, vegetables, and low fat dairy, alcohol moderation, and smoking cessation.;Monitor prescription use compliance.    Expected Outcomes Short Term: Continued assessment and intervention until BP is < 140/57mm HG in hypertensive participants. < 130/52mm HG in hypertensive participants with diabetes, heart failure or chronic kidney disease.;Long Term: Maintenance of blood pressure at goal levels.    Lipids Yes    Intervention Provide education and support for participant on nutrition & aerobic/resistive exercise along with prescribed medications to achieve LDL 70mg , HDL >40mg .    Expected Outcomes Short Term: Participant states understanding of desired cholesterol values and is compliant with medications prescribed. Participant is following exercise prescription and nutrition guidelines.;Long Term: Cholesterol controlled with medications as prescribed, with individualized exercise RX and with personalized nutrition plan. Value goals: LDL < 70mg , HDL > 40 mg.          Education:Diabetes - Individual verbal and written instruction to review signs/symptoms of diabetes, desired ranges of glucose level fasting, after meals and with exercise. Acknowledge that pre and post exercise glucose checks will be done for 3 sessions at entry of program.   Core Components/Risk Factors/Patient Goals Review:    Core Components/Risk Factors/Patient Goals at Discharge (Final Review):    ITP Comments:  ITP Comments     Row Name 07/10/24 0943 07/15/24 1528 07/29/24 1050 08/05/24 1438 08/26/24 0946   ITP Comments Initial phone call completed. Diagnosis can be found in [facility] 9/15. EP Orientation scheduled for Wednesday 10/8 at 1:45. Etta is a current tobacco user. Intervention for tobacco cessation was provided at the initial medical review. She  was asked about readiness to quit and reported she is ready to quit. Patient was advised and educated about tobacco cessation using combination therapy, tobacco cessation classes, quit line, and quit smoking apps. Patient demonstrated understanding of this material. Staff will continue to provide encouragement and follow up with the patient throughout the program. Completed and gym orientation for cardiac rehab. Initial ITP created and sent for review to Dr. Oneil Pinal, Medical Director. 30 Day review completed. Medical Director ITP review done, changes made as directed, and signed approval by Medical Director. First full day of exercise!  Patient  was oriented to gym and equipment including functions, settings, policies, and procedures.  Patient's individual exercise prescription and treatment plan were reviewed.  All starting workloads were established based on the results of the 6 minute walk test done at initial orientation visit.  The plan for exercise progression was also introduced and progression will be customized based on patient's performance and goals. 30 Day review completed. Medical Director ITP review done; changes made as directed and signed approval by Medical Director. New to program.    Row Name 09/23/24 0943           ITP Comments 30 Day review completed. Medical Director ITP review done, changes made as directed, and signed approval by Medical Director.          Comments: 30 day review    [1]  Current Outpatient Medications:    ALPRAZolam  (XANAX ) 0.25 MG tablet, TAKE 1 TABLET BY MOUTH AS NEEDED FOR ANXIETY, Disp: 30 tablet, Rfl: 2   aspirin  81 MG chewable tablet, Chew 81 mg by mouth daily., Disp: , Rfl:    atorvastatin  (LIPITOR ) 40 MG tablet, TAKE 1 TABLET BY MOUTH EVERY DAY, Disp: 90 tablet, Rfl: 3   desipramine (NORPRAMIN) 25 MG tablet, Take 25 mg by mouth., Disp: , Rfl:    levothyroxine  (SYNTHROID ) 50 MCG tablet, Take 1 tab po every day in the morning, Disp: 90  tablet, Rfl: 1   pantoprazole  (PROTONIX ) 40 MG tablet, TAKE 1 TABLET BY MOUTH EVERY DAY, Disp: 90 tablet, Rfl: 1   prasugrel  (EFFIENT ) 10 MG TABS tablet, Take 0.5 tablets (5 mg total) by mouth daily., Disp: 30 tablet, Rfl: 2   traMADol  (ULTRAM ) 50 MG tablet, TAKE 1/2-1 TAB DAILY AS NEEDED FOR SEVERE PAIN, Disp: 30 tablet, Rfl: 0   valACYclovir  (VALTREX ) 500 MG tablet, TAKE 1 TABLET (500 MG TOTAL) BY MOUTH DAILY., Disp: 90 tablet, Rfl: 1   zolpidem  (AMBIEN ) 10 MG tablet, TAKE 1 TABLET BY MOUTH EVERYDAY AT BEDTIME, Disp: 30 tablet, Rfl: 2 [2]  Social History Tobacco Use  Smoking Status Every Day   Current packs/day: 1.00   Average packs/day: 1 pack/day for 30.0 years (30.0 ttl pk-yrs)   Types: Cigarettes   Passive exposure: Past  Smokeless Tobacco Never  Tobacco Comments   1 pack daily

## 2024-09-23 NOTE — Progress Notes (Signed)
 Daily Session Note  Patient Details  Name: Joy Carpenter MRN: [id number] Date of Birth: 01/15/1951 Referring Provider:   Flowsheet Row Cardiac Rehab from 07/15/2024 in [facility] Cardiac and Pulmonary Rehab  Referring Provider Dr. Cara Lovelace    Encounter Date: 09/23/2024  Check In:  Session Check In - 09/23/24 1028       Check-In   Supervising physician immediately available to respond to emergencies See telemetry face sheet for immediately available ER MD    Location [facility]-Cardiac & Pulmonary Rehab    Staff Present Burnard Davenport RN,BSN,MPA;[facility] RCP,RRT,BSRT;Laura Cates RN,BSN;Margaret Best, MS, Exercise Physiologist    Virtual Visit No    Medication changes reported     No    Fall or balance concerns reported    No    Tobacco Cessation No Change    Warm-up and Cool-down Performed on first and last piece of equipment    Resistance Training Performed Yes    VAD Patient? No    PAD/SET Patient? No      Pain Assessment   Currently in Pain? No/denies             Tobacco Use History[1]  Goals Met:  Independence with exercise equipment Exercise tolerated well No report of concerns or symptoms today Strength training completed today  Goals Unmet:  Not Applicable  Comments: Pt able to follow exercise prescription today without complaint.  Will continue to monitor for progression.    Dr. Oneil Pinal is Medical Director for [facility] Cardiac Rehabilitation.  Dr. Fuad Aleskerov is Medical Director for [facility] Pulmonary Rehabilitation.    [1]  Social History Tobacco Use  Smoking Status Every Day   Current packs/day: 1.00   Average packs/day: 1 pack/day for 30.0 years (30.0 ttl pk-yrs)   Types: Cigarettes   Passive exposure: Past  Smokeless Tobacco Never  Tobacco Comments   1 pack daily

## 2024-09-24 ENCOUNTER — Ambulatory Visit

## 2024-09-24 ENCOUNTER — Encounter

## 2024-09-24 DIAGNOSIS — E538 Deficiency of other specified B group vitamins: Secondary | ICD-10-CM

## 2024-09-24 MED ORDER — CYANOCOBALAMIN 1000 MCG/ML IJ SOLN
1000.0000 ug | Freq: Once | INTRAMUSCULAR | Status: AC
  Administered 2024-09-24: 11:00:00 1000 ug via INTRAMUSCULAR

## 2024-09-25 ENCOUNTER — Other Ambulatory Visit: Payer: Self-pay | Admitting: Physician Assistant

## 2024-09-25 DIAGNOSIS — F5101 Primary insomnia: Secondary | ICD-10-CM

## 2024-09-28 ENCOUNTER — Encounter

## 2024-09-28 DIAGNOSIS — Z955 Presence of coronary angioplasty implant and graft: Secondary | ICD-10-CM

## 2024-09-28 DIAGNOSIS — I213 ST elevation (STEMI) myocardial infarction of unspecified site: Secondary | ICD-10-CM

## 2024-09-28 NOTE — Progress Notes (Signed)
 Daily Session Note  Patient Details  Name: Joy Carpenter MRN: [id number] Date of Birth: [birth year] Referring Provider:   Flowsheet Row Cardiac Rehab from 07/15/2024 in [facility] Cardiac and Pulmonary Rehab  Referring Provider Dr. Cara Lovelace    Encounter Date: 09/28/2024  Check In:  Session Check In - 09/28/24 1111       Check-In   Supervising physician immediately available to respond to emergencies See telemetry face sheet for immediately available ER MD    Location [facility]-Cardiac & Pulmonary Rehab    Staff Present Burnard Davenport [facility] Peggi, RN, DNP, NE-BC;Kristen Coble RN,BC,MSN;Celia Friedland Dyane BS, ACSM CEP, Exercise Physiologist    Virtual Visit No    Medication changes reported     No    Fall or balance concerns reported    No    Tobacco Cessation No Change    Warm-up and Cool-down Performed on first and last piece of equipment    Resistance Training Performed Yes    VAD Patient? No    PAD/SET Patient? No      Pain Assessment   Currently in Pain? No/denies             Tobacco Use History[1]  Goals Met:  Independence with exercise equipment Exercise tolerated well No report of concerns or symptoms today Strength training completed today  Goals Unmet:  Not Applicable  Comments: Pt able to follow exercise prescription today without complaint.  Will continue to monitor for progression.    Dr. Oneil Pinal is Medical Director for [facility] Cardiac Rehabilitation.  Dr. Fuad Aleskerov is Medical Director for [facility] Pulmonary Rehabilitation.    [1]  Social History Tobacco Use  Smoking Status Every Day   Current packs/day: 1.00   Average packs/day: 1 pack/day for 30.0 years (30.0 ttl pk-yrs)   Types: Cigarettes   Passive exposure: Past  Smokeless Tobacco Never  Tobacco Comments   1 pack daily

## 2024-09-30 ENCOUNTER — Encounter

## 2024-09-30 DIAGNOSIS — I213 ST elevation (STEMI) myocardial infarction of unspecified site: Secondary | ICD-10-CM | POA: Diagnosis not present

## 2024-09-30 DIAGNOSIS — Z955 Presence of coronary angioplasty implant and graft: Secondary | ICD-10-CM

## 2024-09-30 NOTE — Progress Notes (Signed)
 Daily Session Note  Patient Details  Name: Joy Carpenter MRN: [id number] Date of Birth: [birth year] Referring Provider:   Flowsheet Row Cardiac Rehab from 07/15/2024 in [facility] Cardiac and Pulmonary Rehab  Referring Provider Dr. Cara Lovelace    Encounter Date: 09/30/2024  Check In:  Session Check In - 09/30/24 1049       Check-In   Supervising physician immediately available to respond to emergencies See telemetry face sheet for immediately available ER MD    Location [facility]-Cardiac & Pulmonary Rehab    Staff Present Burnard Davenport RN,BSN,MPA;[facility] RCP,RRT,BSRT;Laura Cates RN,BSN;Noah Tickle, [place], Exercise Physiologist    Virtual Visit No    Medication changes reported     No    Fall or balance concerns reported    No    Tobacco Cessation No Change    Warm-up and Cool-down Performed on first and last piece of equipment    Resistance Training Performed Yes    VAD Patient? No    PAD/SET Patient? No      Pain Assessment   Currently in Pain? No/denies             Tobacco Use History[1]  Goals Met:  Independence with exercise equipment Exercise tolerated well No report of concerns or symptoms today Strength training completed today  Goals Unmet:  Not Applicable  Comments: Pt able to follow exercise prescription today without complaint.  Will continue to monitor for progression.    Dr. Oneil Pinal is Medical Director for [facility] Cardiac Rehabilitation.  Dr. Fuad Aleskerov is Medical Director for [facility] Pulmonary Rehabilitation.    [1]  Social History Tobacco Use  Smoking Status Every Day   Current packs/day: 1.00   Average packs/day: 1 pack/day for 30.0 years (30.0 ttl pk-yrs)   Types: Cigarettes   Passive exposure: Past  Smokeless Tobacco Never  Tobacco Comments   1 pack daily

## 2024-10-05 ENCOUNTER — Encounter

## 2024-10-05 DIAGNOSIS — Z955 Presence of coronary angioplasty implant and graft: Secondary | ICD-10-CM

## 2024-10-05 DIAGNOSIS — I213 ST elevation (STEMI) myocardial infarction of unspecified site: Secondary | ICD-10-CM

## 2024-10-05 NOTE — Progress Notes (Signed)
 Daily Session Note  Patient Details  Name: Joy Carpenter MRN: [id number] Date of Birth: 1951-05-10 Referring Provider:   Flowsheet Row Cardiac Rehab from 07/15/2024 in [facility] Cardiac and Pulmonary Rehab  Referring Provider Dr. Cara Lovelace    Encounter Date: 10/05/2024  Check In:  Session Check In - 10/05/24 1118       Check-In   Supervising physician immediately available to respond to emergencies See telemetry face sheet for immediately available ER MD    Location [facility]-Cardiac & Pulmonary Rehab    Staff Present Burnard Davenport RN,BSN,MPA;Maxon Conetta BS, Exercise Physiologist;Joseph Rol[facility]WO[facility] HARMAN Cecilie Delores, BS, RRT, CPFT    Virtual Visit No    Medication changes reported     No    Fall or balance concerns reported    No    Tobacco Cessation No Change    Warm-up and Cool-down Performed on first and last piece of equipment    Resistance Training Performed Yes    VAD Patient? No    PAD/SET Patient? No      Pain Assessment   Currently in Pain? No/denies             Tobacco Use History[1]  Goals Met:  Independence with exercise equipment Exercise tolerated well No report of concerns or symptoms today Strength training completed today  Goals Unmet:  Not Applicable  Comments: Pt able to follow exercise prescription today without complaint.  Will continue to monitor for progression.    Dr. Oneil Pinal is Medical Director for [facility] Cardiac Rehabilitation.  Dr. Fuad Aleskerov is Medical Director for [facility] Pulmonary Rehabilitation.    [1]  Social History Tobacco Use  Smoking Status Every Day   Current packs/day: 1.00   Average packs/day: 1 pack/day for 30.0 years (30.0 ttl pk-yrs)   Types: Cigarettes   Passive exposure: Past  Smokeless Tobacco Never  Tobacco Comments   1 pack daily

## 2024-10-07 ENCOUNTER — Encounter

## 2024-10-07 DIAGNOSIS — I213 ST elevation (STEMI) myocardial infarction of unspecified site: Secondary | ICD-10-CM

## 2024-10-07 DIAGNOSIS — Z955 Presence of coronary angioplasty implant and graft: Secondary | ICD-10-CM

## 2024-10-07 NOTE — Progress Notes (Signed)
 Daily Session Note  Patient Details  Name: Joy Carpenter MRN: [id number] Date of Birth: 03-27-1951 Referring Provider:   Flowsheet Row Cardiac Rehab from 07/15/2024 in [facility] Cardiac and Pulmonary Rehab  Referring Provider Dr. Cara Lovelace    Encounter Date: 10/07/2024  Check In:  Session Check In - 10/07/24 1032       Check-In   Supervising physician immediately available to respond to emergencies See telemetry face sheet for immediately available ER MD    Location [facility]-Cardiac & Pulmonary Rehab    Staff Present Burnard Davenport RN,BSN,MPA;Joseph Rolinda RCP,RRT,BSRT;Margaret Best, MS, Exercise Physiologist;Noah Tickle, BS, Exercise Physiologist;Laura Cates RN,BSN    Virtual Visit No    Medication changes reported     No    Fall or balance concerns reported    No    Tobacco Cessation No Change    Warm-up and Cool-down Performed on first and last piece of equipment    Resistance Training Performed Yes    VAD Patient? No    PAD/SET Patient? No      Pain Assessment   Currently in Pain? No/denies             Tobacco Use History[1]  Goals Met:  Independence with exercise equipment Exercise tolerated well No report of concerns or symptoms today Strength training completed today  Goals Unmet:  Not Applicable  Comments: Pt able to follow exercise prescription today without complaint.  Will continue to monitor for progression.    Dr. Oneil Pinal is Medical Director for [facility] Cardiac Rehabilitation.  Dr. Fuad Aleskerov is Medical Director for [facility] Pulmonary Rehabilitation.    [1]  Social History Tobacco Use  Smoking Status Every Day   Current packs/day: 1.00   Average packs/day: 1 pack/day for 30.0 years (30.0 ttl pk-yrs)   Types: Cigarettes   Passive exposure: Past  Smokeless Tobacco Never  Tobacco Comments   1 pack daily

## 2024-10-12 ENCOUNTER — Encounter

## 2024-10-14 ENCOUNTER — Encounter: Attending: Internal Medicine | Admitting: Emergency Medicine

## 2024-10-14 ENCOUNTER — Encounter

## 2024-10-14 VITALS — Ht 66.3 in | Wt 127.2 lb

## 2024-10-14 DIAGNOSIS — I213 ST elevation (STEMI) myocardial infarction of unspecified site: Secondary | ICD-10-CM

## 2024-10-14 DIAGNOSIS — I252 Old myocardial infarction: Secondary | ICD-10-CM | POA: Insufficient documentation

## 2024-10-14 DIAGNOSIS — Z48812 Encounter for surgical aftercare following surgery on the circulatory system: Secondary | ICD-10-CM | POA: Insufficient documentation

## 2024-10-14 DIAGNOSIS — Z955 Presence of coronary angioplasty implant and graft: Secondary | ICD-10-CM | POA: Insufficient documentation

## 2024-10-14 NOTE — Patient Instructions (Addendum)
 Discharge Patient Instructions  Patient Details  Name: Joy Carpenter MRN: [id number] Date of Birth: [birth year] Referring Provider:  Kristina Tinnie POUR, PA-C   Number of Visits: 11  Reason for Discharge:  Patient reached a stable level of exercise. Patient independent in their exercise. Patient has met program and personal goals.  Smoking History:  Social History   Tobacco Use  Smoking Status Every Day   Current packs/day: 1.00   Average packs/day: 1 pack/day for 30.0 years (30.0 ttl pk-yrs)   Types: Cigarettes   Passive exposure: Past  Smokeless Tobacco Never  Tobacco Comments   1 pack daily    Diagnosis:  ST elevation myocardial infarction (STEMI), unspecified artery (HCC)  Status post coronary artery stent placement  Initial Exercise Prescription:  Initial Exercise Prescription - 07/15/24 1500       Date of Initial Exercise RX and Referring Provider   Date 07/15/24    Referring Provider Dr. Cara Lovelace      Oxygen   Maintain Oxygen Saturation 88% or higher      Treadmill   MPH 2.5    Grade 0    Minutes 15    METs 2.91      NuStep   Level 3    SPM 80    Minutes 15    METs 3      REL-XR   Level 3    Watts 25    Speed 50    Minutes 15    METs 3      T5 Nustep   Level 3    SPM 80    Minutes 15    METs 3      Biostep-RELP   Level 3    SPM 80    Minutes 15    METs 3      Rower   Level 7    Watts 25    Minutes 15    METs 3      Prescription Details   Duration Progress to 30 minutes of continuous aerobic without signs/symptoms of physical distress      Intensity   THRR 40-80% of Max Heartrate 107-135    Ratings of Perceived Exertion 11-13    Perceived Dyspnea 0-4      Progression   Progression Continue to progress workloads to maintain intensity without signs/symptoms of physical distress.      Resistance Training   Training Prescription Yes    Weight 4lb    Reps 10-15          Discharge Exercise Prescription  (Final Exercise Prescription Changes):  Exercise Prescription Changes - 10/07/24 0700       Response to Exercise   Blood Pressure (Admit) 96/52    Blood Pressure (Exit) 100/60    Heart Rate (Admit) 78 bpm    Heart Rate (Exercise) 122 bpm    Heart Rate (Exit) 104 bpm    Rating of Perceived Exertion (Exercise) 13    Symptoms none    Duration Continue with 30 min of aerobic exercise without signs/symptoms of physical distress.    Intensity THRR unchanged      Progression   Progression Continue to progress workloads to maintain intensity without signs/symptoms of physical distress.    Average METs 3.59      Resistance Training   Weight 4 lb    Reps 10-15      Interval Training   Interval Training No      Treadmill   MPH 2.5  Grade 0    Minutes 15    METs 2.91      NuStep   Level 4    Minutes 15    METs 2.8      Arm Ergometer   Level 1.1    Watts 38    Minutes 15    METs 4.45      REL-XR   Level 6    Minutes 15    METs 4.4      T5 Nustep   Level 3    Minutes 15      Biostep-RELP   Level 4    Minutes 15    METs 3      Home Exercise Plan   Plans to continue exercise at Home (comment)   walking and looking into a community gym or [employer].   Frequency Add 2 additional days to program exercise sessions.    Initial Home Exercises Provided 09/07/24      Oxygen   Maintain Oxygen Saturation 88% or higher          Functional Capacity:  6 Minute Walk     Row Name 07/15/24 1528 10/14/24 1126       6 Minute Walk   Phase Initial Discharge    Distance 1205 feet 1230 feet    Distance % Change -- 2.07 %    Distance Feet Change -- 25 ft    Walk Time 6 minutes 6 minutes    # of Rest Breaks 0 0    MPH 2.3 2.33    METS 3.03 3.24    RPE 8 12    Perceived Dyspnea  0 0    VO2 Peak 10.6 11.33    Symptoms Yes (comment) Yes (comment)    Comments Left calf cramp Left calf cramp at end    Resting HR 80 bpm 83 bpm    Resting BP 116/64 110/70    Resting  Oxygen Saturation  98 % 99 %    Exercise Oxygen Saturation  during 6 min walk 99 % 98 %    Max Ex. HR 103 bpm 123 bpm    Max Ex. BP 124/70 120/70    2 Minute Post BP 112/68 120/70      Nutrition & Weight - Outcomes:  Pre Biometrics - 07/15/24 1533       Pre Biometrics   Height 5' 6.3 (1.684 m)    Weight 128 lb 6.4 oz (58.2 kg)    Waist Circumference 31 inches    Hip Circumference 38 inches    Waist to Hip Ratio 0.82 %    BMI (Calculated) 20.54    Single Leg Stand 30 seconds          Post Biometrics - 10/14/24 1127        Post  Biometrics   Height 5' 6.3 (1.684 m)    Weight 127 lb 3.2 oz (57.7 kg)    Waist Circumference 36 inches    Hip Circumference 37.5 inches    Waist to Hip Ratio 0.96 %    BMI (Calculated) 20.35    Single Leg Stand 30 seconds

## 2024-10-14 NOTE — Progress Notes (Signed)
 Daily Session Note  Patient Details  Name: Joy Carpenter MRN: [id number] Date of Birth: 03-Sep-1951 Referring Provider:   Flowsheet Row Cardiac Rehab from 07/15/2024 in [facility] Cardiac and Pulmonary Rehab  Referring Provider Dr. Cara Lovelace    Encounter Date: 10/14/2024  Check In:  Session Check In - 10/14/24 1119       Check-In   Supervising physician immediately available to respond to emergencies See telemetry face sheet for immediately available ER MD    Location [facility]-Cardiac & Pulmonary Rehab    Staff Present Leita Franks RN,BSN;[facility] BS, Exercise Physiologist;Margaret Best, MS, Exercise Physiologist    Virtual Visit No    Medication changes reported     No    Fall or balance concerns reported    No    Warm-up and Cool-down Performed on first and last piece of equipment    Resistance Training Performed Yes    VAD Patient? No    PAD/SET Patient? No      Pain Assessment   Currently in Pain? No/denies             Tobacco Use History[1]  Goals Met:  Independence with exercise equipment Exercise tolerated well No report of concerns or symptoms today Strength training completed today  Goals Unmet:  Not Applicable  Comments: Pt able to follow exercise prescription today without complaint.  Will continue to monitor for progression.    Dr. Oneil Pinal is Medical Director for [facility] Cardiac Rehabilitation.  Dr. Fuad Aleskerov is Medical Director for [facility] Pulmonary Rehabilitation.    [1]  Social History Tobacco Use  Smoking Status Every Day   Current packs/day: 1.00   Average packs/day: 1 pack/day for 30.0 years (30.0 ttl pk-yrs)   Types: Cigarettes   Passive exposure: Past  Smokeless Tobacco Never  Tobacco Comments   1 pack daily

## 2024-10-15 ENCOUNTER — Encounter

## 2024-10-16 ENCOUNTER — Encounter: Admitting: Emergency Medicine

## 2024-10-16 DIAGNOSIS — I213 ST elevation (STEMI) myocardial infarction of unspecified site: Secondary | ICD-10-CM

## 2024-10-16 DIAGNOSIS — Z955 Presence of coronary angioplasty implant and graft: Secondary | ICD-10-CM

## 2024-10-16 NOTE — Progress Notes (Signed)
 Daily Session Note  Patient Details  Name: Joy Carpenter MRN: [id number] Date of Birth: Nov 24, 1950 Referring Provider:   Flowsheet Row Cardiac Rehab from 07/15/2024 in [facility] Card[facility] Re[facility]  Referring Provider Dr. Cara Lovelace    Encounter Date: 10/16/2024  Check In:  Session Check In - 10/16/24 1108       Check-In   Supervising physician immediately available to respond to emergencies See telemetry face sheet for immediately available ER MD    Location [facility]-Cardiac & Pulmonary Rehab    Staff Present Leita Franks RN,BSN;[facility] BS, Exercise Physiologist;Noah Tickle, BS, Exercise Physiologist    Virtual Visit No    Medication changes reported     No    Fall or balance concerns reported    No    Tobacco Cessation No Change    Warm-up and Cool-down Performed on first and last piece of equipment    Resistance Training Performed Yes    VAD Patient? No    PAD/SET Patient? No      Pain Assessment   Currently in Pain? No/denies             Tobacco Use History[1]  Goals Met:  Independence with exercise equipment Exercise tolerated well No report of concerns or symptoms today Strength training completed today  Goals Unmet:  Not Applicable  Comments:  Lilienne graduated today from  rehab with 31 sessions completed.  Details of the patient's exercise prescription and what She needs to do in order to continue the prescription and progress were discussed with patient.  Patient was given a copy of prescription and goals.  Patient verbalized understanding. Epiphany plans to continue to exercise by walking at home and looking into a community gym.    Dr. Oneil Pinal is Medical Director for [facility] Cardiac Rehabilitation.  Dr. Fuad Aleskerov is Medical Director for [facility] Pulmonary Rehabilitation.    [1]  Social History Tobacco Use  Smoking Status Every Day   Current packs/day: 1.00   Average packs/day: 1 pack/day for  30.0 years (30.0 ttl pk-yrs)   Types: Cigarettes   Passive exposure: Past  Smokeless Tobacco Never  Tobacco Comments   1 pack daily

## 2024-10-16 NOTE — Progress Notes (Signed)
 Discharge Summary   Joy Carpenter  1951-02-03   Joy Carpenter graduated today from  rehab with 36 sessions completed.  Details of the patient's exercise prescription and what She needs to do in order to continue the prescription and progress were discussed with patient.  Patient was given a copy of prescription and goals.  Patient verbalized understanding. Joy Carpenter plans to continue to exercise by walking at home and looking into a community gym.   6 Minute Walk     Row Name 07/15/24 1528 10/14/24 1126       6 Minute Walk   Phase Initial Discharge    Distance 1205 feet 1230 feet    Distance % Change -- 2.07 %    Distance Feet Change -- 25 ft    Walk Time 6 minutes 6 minutes    # of Rest Breaks 0 0    MPH 2.3 2.33    METS 3.03 3.24    RPE 8 12    Perceived Dyspnea  0 0    VO2 Peak 10.6 11.33    Symptoms Yes (comment) Yes (comment)    Comments Left calf cramp Left calf cramp at end    Resting HR 80 bpm 83 bpm    Resting BP 116/64 110/70    Resting Oxygen Saturation  98 % 99 %    Exercise Oxygen Saturation  during 6 min walk 99 % 98 %    Max Ex. HR 103 bpm 123 bpm    Max Ex. BP 124/70 120/70    2 Minute Post BP 112/68 120/70

## 2024-10-16 NOTE — Progress Notes (Signed)
 Cardiac Individual Treatment Plan  Patient Details  Name: Joy Carpenter MRN: [id number] Date of Birth: 09-Jun-1951 Referring Provider:   Flowsheet Row Cardiac Rehab from 07/15/2024 in [facility] Cardiac and Pulmonary Rehab  Referring Provider Dr. Cara Lovelace    Initial Encounter Date:  Flowsheet Row Cardiac Rehab from 07/15/2024 in [facility] Cardiac and Pulmonary Rehab  Date 07/15/24    Visit Diagnosis: ST elevation myocardial infarction (STEMI), unspecified artery [facility])  Status post coronary artery stent placement  Patient's Home Medications on Admission: Current Medications[1]  Past Medical History: Past Medical History:  Diagnosis Date   Acquired hypothyroidism    Actinic keratosis 03/28/2021   right upper arm, bx proven   Anxiety    a.) on BZO (alproazolam) PRN   Carpal tunnel syndrome 2019   Coronary artery disease involving native coronary artery    of the LAD and RCA noted on chest CT   Depression    Diastolic dysfunction    Emphysema of lung (HCC)    GERD (gastroesophageal reflux disease)    Herpes    History of hiatal hernia    Hypertensive chronic kidney disease    Insomnia    a.) on hypnotic (zolpidem ) PRN   Long-term use of aspirin  therapy    Mixed hyperlipidemia    Osteoporosis    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   Primary hypertension    Pulmonary nodules    SCC (squamous cell carcinoma) 03/28/2021   left dorsal hand, EDC   SCC (squamous cell carcinoma) 08/01/2023   SCC IS, L ant shoulder, EDC 08/27/2023   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   left anterior shoulder, EDC 08/27/23   Stress headaches (almost daily)    Thoracoabdominal aortic aneurysm    a.) s/p 4v FEVAR (CA, SMA, RRA, LRA) and proximal TEVAR extention with BILATERAL iliac limb extensions on 11/16/2020   Vertigo 2010   Vitamin D  deficiency    Weakness of both legs (intermittent)     Tobacco Use: Tobacco Use History[2]  Labs: Review Flowsheet  More data exists       Latest Ref Rng & Units 09/14/2021 07/02/2022 04/22/2023 06/22/2024 08/11/2024  Labs for ITP Cardiac and Pulmonary Rehab  Cholestrol 100 - 199 mg/dL 800  821  864  861  852   LDL (calc) 0 - 99 mg/dL 872  891  73  58  70   HDL-C >39 mg/dL 54  58  53  66  65   Trlycerides 0 - 149 mg/dL 898  64  46  70  60   Hemoglobin A1c 4.8 - 5.6 % - - - 5.6  -     Exercise Target Goals: Exercise Program Goal: Individual exercise prescription set using results from initial 6 min walk test and THRR while considering  patients activity barriers and safety.   Exercise Prescription Goal: Initial exercise prescription builds to 30-45 minutes a day of aerobic activity, 2-3 days per week.  Home exercise guidelines will be given to patient during program as part of exercise prescription that the participant will acknowledge.   Education: Aerobic Exercise: - Group verbal and visual presentation on the components of exercise prescription. Introduces F.I.T.T principle from ACSM for exercise prescriptions.  Reviews F.I.T.T. principles of aerobic exercise including progression. Written material provided at class time. Flowsheet Row Cardiac Rehab from 10/14/2024 in [facility] Cardiac and Pulmonary Rehab  Education need identified 07/15/24    Education: Resistance Exercise: - Group verbal and visual presentation on the components of exercise  prescription. Introduces F.I.T.T principle from ACSM for exercise prescriptions  Reviews F.I.T.T. principles of resistance exercise including progression. Written material provided at class time.    Education: Exercise & Equipment Safety: - Individual verbal instruction and demonstration of equipment use and safety with use of the equipment. Flowsheet Row Cardiac Rehab from 10/14/2024 in [facility] Cardiac and Pulmonary Rehab  Date 07/15/24  Educator [facility]  Instruction Review Code 1- Verbalizes Understanding    Education: Exercise Physiology & General Exercise Guidelines: - Group verbal  and written instruction with models to review the exercise physiology of the cardiovascular system and associated critical values. Provides general exercise guidelines with specific guidelines to those with heart or lung disease. Written material provided at class time. Flowsheet Row Cardiac Rehab from 10/14/2024 in [facility] Cardiac and Pulmonary Rehab  Date 09/16/24  Educator nt  Instruction Review Code 1- Tefl Teacher Understanding    Education: Flexibility, Balance, Mind/Body Relaxation: - Group verbal and visual presentation with interactive activity on the components of exercise prescription. Introduces F.I.T.T principle from ACSM for exercise prescriptions. Reviews F.I.T.T. principles of flexibility and balance exercise training including progression. Also discusses the mind body connection.  Reviews various relaxation techniques to help reduce and manage stress (i.e. Deep breathing, progressive muscle relaxation, and visualization). Balance handout provided to take home. Written material provided at class time. Flowsheet Row Cardiac Rehab from 10/14/2024 in [facility] Cardiac and Pulmonary Rehab  Date 09/23/24  Educator nt  Instruction Review Code 1- Verbalizes Understanding    Activity Barriers & Risk Stratification:  Activity Barriers & Cardiac Risk Stratification - 07/15/24 1530       Activity Barriers & Cardiac Risk Stratification   Activity Barriers Back Problems    Cardiac Risk Stratification High          6 Minute Walk:  6 Minute Walk     Row Name 07/15/24 1528 10/14/24 1126       6 Minute Walk   Phase Initial Discharge    Distance 1205 feet 1230 feet    Distance % Change -- 2.07 %    Distance Feet Change -- 25 ft    Walk Time 6 minutes 6 minutes    # of Rest Breaks 0 0    MPH 2.3 2.33    METS 3.03 3.24    RPE 8 12    Perceived Dyspnea  0 0    VO2 Peak 10.6 11.33    Symptoms Yes (comment) Yes (comment)    Comments Left calf cramp Left calf cramp at end    Resting HR 80  bpm 83 bpm    Resting BP 116/64 110/70    Resting Oxygen Saturation  98 % 99 %    Exercise Oxygen Saturation  during 6 min walk 99 % 98 %    Max Ex. HR 103 bpm 123 bpm    Max Ex. BP 124/70 120/70    2 Minute Post BP 112/68 120/70       Oxygen Initial Assessment:   Oxygen Re-Evaluation:   Oxygen Discharge (Final Oxygen Re-Evaluation):   Initial Exercise Prescription:  Initial Exercise Prescription - 07/15/24 1500       Date of Initial Exercise RX and Referring Provider   Date 07/15/24    Referring Provider Dr. Cara Lovelace      Oxygen   Maintain Oxygen Saturation 88% or higher      Treadmill   MPH 2.5    Grade 0    Minutes 15    METs  2.91      NuStep   Level 3    SPM 80    Minutes 15    METs 3      REL-XR   Level 3    Watts 25    Speed 50    Minutes 15    METs 3      T5 Nustep   Level 3    SPM 80    Minutes 15    METs 3      Biostep-RELP   Level 3    SPM 80    Minutes 15    METs 3      Rower   Level 7    Watts 25    Minutes 15    METs 3      Prescription Details   Duration Progress to 30 minutes of continuous aerobic without signs/symptoms of physical distress      Intensity   THRR 40-80% of Max Heartrate 107-135    Ratings of Perceived Exertion 11-13    Perceived Dyspnea 0-4      Progression   Progression Continue to progress workloads to maintain intensity without signs/symptoms of physical distress.      Resistance Training   Training Prescription Yes    Weight 4lb    Reps 10-15          Perform Capillary Blood Glucose checks as needed.  Exercise Prescription Changes:   Exercise Prescription Changes     Row Name 07/15/24 1500 08/12/24 1300 08/25/24 1400 09/07/24 1200 09/10/24 1000     Response to Exercise   Blood Pressure (Admit) 116/64 102/62 102/62 -- 130/68   Blood Pressure (Exercise) 124/70 100/60 152/72 -- 114/62   Blood Pressure (Exit) 112/68 98/54 112/62 -- 114/68   Heart Rate (Admit) 80 bpm 88 bpm 80 bpm  -- 89 bpm   Heart Rate (Exercise) 103 bpm 124 bpm 116 bpm -- 132 bpm   Heart Rate (Exit) 76 bpm 98 bpm 93 bpm -- 101 bpm   Oxygen Saturation (Admit) 98 % -- -- -- --   Oxygen Saturation (Exercise) 99 % -- -- -- --   Oxygen Saturation (Exit) 99 % -- -- -- --   Rating of Perceived Exertion (Exercise) 8 12 13  -- 13   Perceived Dyspnea (Exercise) 0 -- -- -- --   Symptoms left calf cramp none none -- none   Comments results 1st day -- -- --   Duration -- Progress to 30 minutes of  aerobic without signs/symptoms of physical distress Continue with 30 min of aerobic exercise without signs/symptoms of physical distress. Continue with 30 min of aerobic exercise without signs/symptoms of physical distress. Continue with 30 min of aerobic exercise without signs/symptoms of physical distress.   Intensity -- THRR unchanged THRR unchanged THRR unchanged THRR unchanged     Progression   Progression -- Continue to progress workloads to maintain intensity without signs/symptoms of physical distress. Continue to progress workloads to maintain intensity without signs/symptoms of physical distress. Continue to progress workloads to maintain intensity without signs/symptoms of physical distress. Continue to progress workloads to maintain intensity without signs/symptoms of physical distress.   Average METs -- 2.1 2.95 2.95 2.92     Resistance Training   Training Prescription -- Yes Yes Yes Yes   Weight -- 4lb 4 lb 4 lb 4 lb   Reps -- 10-15 10-15 10-15 10-15     Interval Training   Interval Training -- No No No No  Treadmill   MPH -- 1.7 2.2 2.2 2.3   Grade -- 0 1 1 0   Minutes -- 15 15 15 15    METs -- 2.3 2.99 2.99 2.76     NuStep   Level -- 2  T6 Nustep 3 3 4    Minutes -- 15 15 15 15    METs -- 1.9 2.9 2.9 3     REL-XR   Level -- -- 4 4 4    Minutes -- -- 15 15 15    METs -- -- 3.7 3.7 4.1     T5 Nustep   Level -- -- -- -- 3   Minutes -- -- -- -- 15   METs -- -- -- -- 2.3     Home  Exercise Plan   Plans to continue exercise at -- -- -- Home (comment)  walking and looking into a community gym or wellzone. Home (comment)  walking and looking into a community gym or wellzone.   Frequency -- -- -- Add 2 additional days to program exercise sessions. Add 2 additional days to program exercise sessions.   Initial Home Exercises Provided -- -- -- 09/07/24 09/07/24     Oxygen   Maintain Oxygen Saturation -- 88% or higher 88% or higher 88% or higher 88% or higher    Row Name 09/23/24 1100 10/07/24 0700           Response to Exercise   Blood Pressure (Admit) 102/58 96/52      Blood Pressure (Exit) 96/58 100/60      Heart Rate (Admit) 91 bpm 78 bpm      Heart Rate (Exercise) 117 bpm 122 bpm      Heart Rate (Exit) 94 bpm 104 bpm      Rating of Perceived Exertion (Exercise) 13 13      Symptoms none none      Duration Continue with 30 min of aerobic exercise without signs/symptoms of physical distress. Continue with 30 min of aerobic exercise without signs/symptoms of physical distress.      Intensity THRR unchanged THRR unchanged        Progression   Progression Continue to progress workloads to maintain intensity without signs/symptoms of physical distress. Continue to progress workloads to maintain intensity without signs/symptoms of physical distress.      Average METs 3.02 3.59        Resistance Training   Weight 4 lb 4 lb      Reps 10-15 10-15        Interval Training   Interval Training No No        Treadmill   MPH 2.5 2.5      Grade 0 0      Minutes 15 15      METs 2.91 2.91        NuStep   Level -- 4      Minutes -- 15      METs -- 2.8        Arm Ergometer   Level -- 1.1      Watts -- 38      Minutes -- 15      METs -- 4.45        REL-XR   Level 6 6      Minutes 15 15      METs 4.2 4.4        T5 Nustep   Level 3 3      Minutes 15 15  METs 2 --        Biostep-RELP   Level -- 4      Minutes -- 15      METs -- 3        Home Exercise  Plan   Plans to continue exercise at Home (comment)  walking and looking into a community gym or wellzone. Home (comment)  walking and looking into a community gym or wellzone.      Frequency Add 2 additional days to program exercise sessions. Add 2 additional days to program exercise sessions.      Initial Home Exercises Provided 09/07/24 09/07/24        Oxygen   Maintain Oxygen Saturation 88% or higher 88% or higher         Exercise Comments:   Exercise Comments     Row Name 08/05/24 1438           Exercise Comments First full day of exercise!  Patient was oriented to gym and equipment including functions, settings, policies, and procedures.  Patient's individual exercise prescription and treatment plan were reviewed.  All starting workloads were established based on the results of the 6 minute walk test done at initial orientation visit.  The plan for exercise progression was also introduced and progression will be customized based on patient's performance and goals.          Exercise Goals and Review:   Exercise Goals     Row Name 07/15/24 1533             Exercise Goals   Increase Physical Activity Yes       Intervention Develop an individualized exercise prescription for aerobic and resistive training based on initial evaluation findings, risk stratification, comorbidities and participant's personal goals.;Provide advice, education, support and counseling about physical activity/exercise needs.       Expected Outcomes Short Term: Attend rehab on a regular basis to increase amount of physical activity.;Long Term: Add in home exercise to make exercise part of routine and to increase amount of physical activity.;Long Term: Exercising regularly at least 3-5 days a week.       Increase Strength and Stamina Yes       Intervention Provide advice, education, support and counseling about physical activity/exercise needs.;Develop an individualized exercise prescription for aerobic  and resistive training based on initial evaluation findings, risk stratification, comorbidities and participant's personal goals.       Expected Outcomes Short Term: Increase workloads from initial exercise prescription for resistance, speed, and METs.;Short Term: Perform resistance training exercises routinely during rehab and add in resistance training at home;Long Term: Improve cardiorespiratory fitness, muscular endurance and strength as measured by increased METs and functional capacity ( )       Able to understand and use rate of perceived exertion (RPE) scale Yes       Intervention Provide education and explanation on how to use RPE scale       Expected Outcomes Short Term: Able to use RPE daily in rehab to express subjective intensity level;Long Term:  Able to use RPE to guide intensity level when exercising independently       Able to understand and use Dyspnea scale Yes       Intervention Provide education and explanation on how to use Dyspnea scale       Expected Outcomes Short Term: Able to use Dyspnea scale daily in rehab to express subjective sense of shortness of breath during exertion;Long Term: Able to use Dyspnea scale  to guide intensity level when exercising independently       Knowledge and understanding of Target Heart Rate Range (THRR) Yes       Intervention Provide education and explanation of THRR including how the numbers were predicted and where they are located for reference       Expected Outcomes Short Term: Able to state/look up THRR;Short Term: Able to use daily as guideline for intensity in rehab;Long Term: Able to use THRR to govern intensity when exercising independently       Able to check pulse independently Yes       Intervention Provide education and demonstration on how to check pulse in carotid and radial arteries.;Review the importance of being able to check your own pulse for safety during independent exercise       Expected Outcomes Short Term: Able to  explain why pulse checking is important during independent exercise;Long Term: Able to check pulse independently and accurately       Understanding of Exercise Prescription Yes       Intervention Provide education, explanation, and written materials on patient's individual exercise prescription       Expected Outcomes Short Term: Able to explain program exercise prescription;Long Term: Able to explain home exercise prescription to exercise independently          Exercise Goals Re-Evaluation :  Exercise Goals Re-Evaluation     Row Name 08/05/24 1438 08/12/24 1322 08/25/24 1500 09/07/24 1206 09/10/24 1041     Exercise Goal Re-Evaluation   Exercise Goals Review Increase Physical Activity;Able to understand and use rate of perceived exertion (RPE) scale;Knowledge and understanding of Target Heart Rate Range (THRR);Understanding of Exercise Prescription;Increase Strength and Stamina;Able to understand and use Dyspnea scale;Able to check pulse independently Increase Physical Activity;Understanding of Exercise Prescription;Increase Strength and Stamina Increase Physical Activity;Understanding of Exercise Prescription;Increase Strength and Stamina Increase Physical Activity;Able to understand and use rate of perceived exertion (RPE) scale;Knowledge and understanding of Target Heart Rate Range (THRR);Understanding of Exercise Prescription;Increase Strength and Stamina;Able to understand and use Dyspnea scale;Able to check pulse independently Increase Physical Activity;Increase Strength and Stamina;Understanding of Exercise Prescription   Comments Reviewed RPE and dyspnea scale, THR and program prescription with pt today.  Pt voiced understanding and was given a copy of goals to take home. Joy Carpenter is off to a good start in the program and she completed her first day of exercise in this review. She was able to walk at a speed of 1.7 mph on the treadmill. She also worked at level 2 on the T6 nustep. We will  continue to monitor her progress in the program. Joy Carpenter is doing well in rehab. She recently increased her treadmill workload to a speed of 2.2 mph with an incline of 1%. She also improved to level 3 on the T4 nustep and level 4 on the XR. We will continue to monitor her progress in the program. Reviewed home exercise with pt today.  Pt plans to walk and look into joining a community gym or the wellzone with her silver sneakers benefit for exercise.  Reviewed THR, pulse, RPE, sign and symptoms, pulse oximetery and when to call 911 or MD.  Also discussed weather considerations and indoor options.  Pt voiced understanding. Joy Carpenter continues to do well in rehab. She was recently able to increase her treadmill speed form 2.2 to 2.3 mph with no incline. She was also able to increase from levele 3 to 4 on the T4 nustep. We will continue to monitor  her progress in the program.   Expected Outcomes Short: Use RPE daily to regulate intensity. Long: Follow program prescription in THR. Short: Continue to follow current exercise prescription. Long: Continue exercise to improve strength and stamina. Short: Continue to progressively increase workloads. Long: Continue exercise to improve strength and stamina. Short: add 1-2 days a week of exercise on off days of rehab. Long: maintain independent exercise routine upon graduation from cardiac rehab. Short: Continue to increase treadmill workloads. Long: Continue exercise to improve strength and stamina.    Row Name 09/23/24 1154 10/07/24 0754 10/07/24 1126         Exercise Goal Re-Evaluation   Exercise Goals Review Increase Physical Activity;Increase Strength and Stamina;Understanding of Exercise Prescription Increase Physical Activity;Increase Strength and Stamina;Understanding of Exercise Prescription Increase Physical Activity;Increase Strength and Stamina;Understanding of Exercise Prescription     Comments Joy Carpenter continues to do well in rehab. She was recently able to  increase her treadmill speed from 2.3 mph to 2.5 mph with no incline. She was also able to increase to level 6 on the XR. We will continue to monitor her progress in the program. Joy Carpenter is doing well in the program. She has been able to maintain her treadmill workload at a speed of 2. and no incline. She was also able to maintain level 4 on both the biostep and T4 nustep. We will continue to monitor her progress in the program. Joy Carpenter is doing well with rehab. She has been working everyday so has not had much time to exercise outside of rehab. She says she will be able to do more exercise in january becasue her work schedule will be lighter.     Expected Outcomes Short: Continue to progressively increase treadmill workload. Long: Continue exercise to improve strength and stamina. Short: Continue to progressively increase treadmill workload. Long: Continue exercise to improve strength and stamina. STG: Attend rehab regularly and increase workload as able. LTG: Continue to exercise to improve strength and stamina        Discharge Exercise Prescription (Final Exercise Prescription Changes):  Exercise Prescription Changes - 10/07/24 0700       Response to Exercise   Blood Pressure (Admit) 96/52    Blood Pressure (Exit) 100/60    Heart Rate (Admit) 78 bpm    Heart Rate (Exercise) 122 bpm    Heart Rate (Exit) 104 bpm    Rating of Perceived Exertion (Exercise) 13    Symptoms none    Duration Continue with 30 min of aerobic exercise without signs/symptoms of physical distress.    Intensity THRR unchanged      Progression   Progression Continue to progress workloads to maintain intensity without signs/symptoms of physical distress.    Average METs 3.59      Resistance Training   Weight 4 lb    Reps 10-15      Interval Training   Interval Training No      Treadmill   MPH 2.5    Grade 0    Minutes 15    METs 2.91      NuStep   Level 4    Minutes 15    METs 2.8      Arm Ergometer    Level 1.1    Watts 38    Minutes 15    METs 4.45      REL-XR   Level 6    Minutes 15    METs 4.4      T5 Nustep   Level  3    Minutes 15      Biostep-RELP   Level 4    Minutes 15    METs 3      Home Exercise Plan   Plans to continue exercise at Home (comment)   walking and looking into a community gym or wellzone.   Frequency Add 2 additional days to program exercise sessions.    Initial Home Exercises Provided 09/07/24      Oxygen   Maintain Oxygen Saturation 88% or higher          Nutrition:  Target Goals: Understanding of nutrition guidelines, daily intake of sodium 1500mg , cholesterol 200mg , calories 30% from fat and 7% or less from saturated fats, daily to have 5 or more servings of fruits and vegetables.  Education: Nutrition 1 -Group instruction provided by verbal, written material, interactive activities, discussions, models, and posters to present general guidelines for heart healthy nutrition including macronutrients, label reading, and promoting whole foods over processed counterparts. Education serves as pensions consultant of discussion of heart healthy eating for all. Written material provided at class time. Flowsheet Row Cardiac Rehab from 10/14/2024 in [facility] Cardiac and Pulmonary Rehab  Date 10/07/24  Educator jg  Instruction Review Code 1- Verbalizes Understanding     Education: Nutrition 2 -Group instruction provided by verbal, written material, interactive activities, discussions, models, and posters to present general guidelines for heart healthy nutrition including sodium, cholesterol, and saturated fat. Providing guidance of habit forming to improve blood pressure, cholesterol, and body weight. Written material provided at class time. Flowsheet Row Cardiac Rehab from 10/14/2024 in [facility] Cardiac and Pulmonary Rehab  Date 08/05/24  Educator jg  Instruction Review Code 1- Verbalizes Understanding      Biometrics:  Pre Biometrics - 07/15/24 1533       Pre  Biometrics   Height 5' 6.3 (1.684 m)    Weight 128 lb 6.4 oz (58.2 kg)    Waist Circumference 31 inches    Hip Circumference 38 inches    Waist to Hip Ratio 0.82 %    BMI (Calculated) 20.54    Single Leg Stand 30 seconds          Post Biometrics - 10/14/24 1127        Post  Biometrics   Height 5' 6.3 (1.684 m)    Weight 127 lb 3.2 oz (57.7 kg)    Waist Circumference 36 inches    Hip Circumference 37.5 inches    Waist to Hip Ratio 0.96 %    BMI (Calculated) 20.35    Single Leg Stand 30 seconds          Nutrition Therapy Plan and Nutrition Goals:   Nutrition Assessments:  MEDIFICTS Score Key: >=70 Need to make dietary changes  40-70 Heart Healthy Diet <= 40 Therapeutic Level Cholesterol Diet  Flowsheet Row Cardiac Rehab from 10/16/2024 in [facility] Cardiac and Pulmonary Rehab  Picture Your Plate Total Score on Admission 57  Picture Your Plate Total Score on Discharge 56   Picture Your Plate Scores: <59 Unhealthy dietary pattern with much room for improvement. 41-50 Dietary pattern unlikely to meet recommendations for good health and room for improvement. 51-60 More healthful dietary pattern, with some room for improvement.  >60 Healthy dietary pattern, although there may be some specific behaviors that could be improved.    Nutrition Goals Re-Evaluation:  Nutrition Goals Re-Evaluation     Row Name 10/07/24 1132             Goals  Comment Joy Carpenter attended Nutrition education today, discussed carb quality, adeqaute protein intake, and types of fats. Answered questions arouund reading labels and reducing sodium intake.       Expected Outcome STG: Eat balanced meals with controlled intake of saturated fat and sodium. LTG: Follow a heart healthy diet longterm          Nutrition Goals Discharge (Final Nutrition Goals Re-Evaluation):  Nutrition Goals Re-Evaluation - 10/07/24 1132       Goals   Comment Joy Carpenter attended Nutrition education today, discussed carb  quality, adeqaute protein intake, and types of fats. Answered questions arouund reading labels and reducing sodium intake.    Expected Outcome STG: Eat balanced meals with controlled intake of saturated fat and sodium. LTG: Follow a heart healthy diet longterm          Psychosocial: Target Goals: Acknowledge presence or absence of significant depression and/or stress, maximize coping skills, provide positive support system. Participant is able to verbalize types and ability to use techniques and skills needed for reducing stress and depression.   Education: Stress, Anxiety, and Depression - Group verbal and visual presentation to define topics covered.  Reviews how body is impacted by stress, anxiety, and depression.  Also discusses healthy ways to reduce stress and to treat/manage anxiety and depression. Written material provided at class time. Flowsheet Row Cardiac Rehab from 10/14/2024 in [facility]  Date 09/09/24  Educator kb  Instruction Review Code 1- [employer] Understanding    Education: Sleep Hygiene -Provides group verbal and written instruction about how sleep can affect your health.  Define sleep hygiene, discuss sleep cycles and impact of sleep habits. Review good sleep hygiene tips.   Initial Review & Psychosocial Screening:  Initial Psych Review & Screening - 07/10/24 0946       Initial Review   Current issues with Current Sleep Concerns;Current Anxiety/Panic;Current Psychotropic Meds      Family Dynamics   Good Support System? Yes      Screening Interventions   Interventions Encouraged to exercise    Expected Outcomes Short Term goal: Utilizing psychosocial counselor, staff and physician to assist with identification of specific Stressors or current issues interfering with healing process. Setting desired goal for each stressor or current issue identified.;Long Term Goal: Stressors or current issues are controlled or eliminated.;Short Term goal:  Identification and review with participant of any Quality of Life or Depression concerns found by scoring the questionnaire.;Long Term goal: The participant improves quality of Life and PHQ9 Scores as seen by post scores and/or verbalization of changes          Quality of Life Scores:   Quality of Life - 10/16/24 1109       Quality of Life Scores   Health/Function Pre 24.6 %    Health/Function Post 24.43 %    Health/Function % Change -0.69 %    Socioeconomic Pre 26.25 %    Socioeconomic Post 28.63 %    Socioeconomic % Change  9.07 %    Psych/Spiritual Pre 28.79 %    Psych/Spiritual Post 27.43 %    Psych/Spiritual % Change -4.72 %    Family Pre 19.8 %    Family Post 22.5 %    Family % Change 13.64 %    GLOBAL Pre 25.13 %    GLOBAL Post 25.85 %    GLOBAL % Change 2.87 %         Scores of 19 and below usually indicate a poorer quality of  life in these areas.  A difference of  2-3 points is a clinically meaningful difference.  A difference of 2-3 points in the total score of the Quality of Life Index has been associated with significant improvement in overall quality of life, self-image, physical symptoms, and general health in studies assessing change in quality of life.  PHQ-9: Review Flowsheet  More data exists      10/16/2024 07/15/2024 08/01/2023 04/22/2023 12/18/2021  Depression screen PHQ 2/9  Decreased Interest 0 0 0 0 0  Down, Depressed, Hopeless 0 0 0 0 0  PHQ - 2 Score 0 0 0 0 0  Altered sleeping 1 1 - - -  Tired, decreased energy 0 1 - - -  Change in appetite 0 0 - - -  Feeling bad or failure about yourself  0 0 - - -  Trouble concentrating 0 0 - - -  Moving slowly or fidgety/restless 0 0 - - -  Suicidal thoughts 0 0 - - -  PHQ-9 Score 1 2  - - -  Difficult doing work/chores Not difficult at all Not difficult at all - - -    Details       Data saved with a previous flowsheet row definition        Interpretation of Total Score  Total Score Depression  Severity:  1-4 = Minimal depression, 5-9 = Mild depression, 10-14 = Moderate depression, 15-19 = Moderately severe depression, 20-27 = Severe depression   Psychosocial Evaluation and Intervention:  Psychosocial Evaluation - 07/10/24 1000       Psychosocial Evaluation & Interventions   Interventions Encouraged to exercise with the program and follow exercise prescription;Stress management education;Relaxation education    Comments 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 Her wrist is still very bruised and painful, so she is following up weekly with her cardiologist. She mentions it is causing sleep concerns on top of her already problems with staying asleep. She is trying to get in touch with one of her doctors who prescribed some sleep medicine so she can get a refill. She is a current smoker and is interested in quitting. She mentions in December she lost her fiance to lung and brain cancer, so her stress level has been up and smoking is a comfort to her. She has returned to her job at [employer] and is ready to get started in the program to learn more about heart healthy living.    Expected Outcomes Short: attend cardiac rehab for education and exercise Long: develop and maintain positive self care habits    Continue Psychosocial Services  Follow up required by staff          Psychosocial Re-Evaluation:  Psychosocial Re-Evaluation     Row Name 10/07/24 1130             Psychosocial Re-Evaluation   Comments Joy Carpenter denies any anxiety, depression, or stress at this time. She says she has been sleeping well, goes to bed at 8pm and wakes up at 3am for her job.       Expected Outcomes STG: Practice good sleep hygiene. LTG: Achieve and maintain positive outlook on health and daily life       Interventions Encouraged to attend Cardiac Rehabilitation for the exercise       Continue Psychosocial Services  Follow up required by staff          Psychosocial Discharge (Final  Psychosocial Re-Evaluation):  Psychosocial Re-Evaluation - 10/07/24 1130  Psychosocial Re-Evaluation   Comments Joy Carpenter denies any anxiety, depression, or stress at this time. She says she has been sleeping well, goes to bed at 8pm and wakes up at 3am for her job.    Expected Outcomes STG: Practice good sleep hygiene. LTG: Achieve and maintain positive outlook on health and daily life    Interventions Encouraged to attend Cardiac Rehabilitation for the exercise    Continue Psychosocial Services  Follow up required by staff          Vocational Rehabilitation: Provide vocational rehab assistance to qualifying candidates.   Vocational Rehab Evaluation & Intervention:  Vocational Rehab - 07/10/24 0953       Initial Vocational Rehab Evaluation & Intervention   Assessment shows need for Vocational Rehabilitation No          Education: Education Goals: Education classes will be provided on a variety of topics geared toward better understanding of heart health and risk factor modification. Participant will state understanding/return demonstration of topics presented as noted by education test scores.  Learning Barriers/Preferences:  Learning Barriers/Preferences - 07/10/24 9047       Learning Barriers/Preferences   Learning Barriers None    Learning Preferences None          General Cardiac Education Topics:  AED/CPR: - Group verbal and written instruction with the use of models to demonstrate the basic use of the AED with the basic ABC's of resuscitation.   Test and Procedures: - Group verbal and visual presentation and models provide information about basic cardiac anatomy and function. Reviews the testing methods done to diagnose heart disease and the outcomes of the test results. Describes the treatment choices: Medical Management, Angioplasty, or Coronary Bypass Surgery for treating various heart conditions including Myocardial Infarction, Angina, Valve Disease, and  Cardiac Arrhythmias. Written material provided at class time. Flowsheet Row Cardiac Rehab from 10/14/2024 in [facility] Cardiac and Pulmonary Rehab  Date 10/14/24  Educator kb  Instruction Review Code 1- Verbalizes Understanding    Medication Safety: - Group verbal and visual instruction to review commonly prescribed medications for heart and lung disease. Reviews the medication, class of the drug, and side effects. Includes the steps to properly store meds and maintain the prescription regimen. Written material provided at class time. Flowsheet Row Cardiac Rehab from 10/14/2024 in [facility] Cardiac and Pulmonary Rehab  Date 08/19/24  Educator kb  Instruction Review Code 1- Verbalizes Understanding    Intimacy: - Group verbal instruction through game format to discuss how heart and lung disease can affect sexual intimacy. Written material provided at class time.   Know Your Numbers and Heart Failure: - Group verbal and visual instruction to discuss disease risk factors for cardiac and pulmonary disease and treatment options.  Reviews associated critical values for Overweight/Obesity, Hypertension, Cholesterol, and Diabetes.  Discusses basics of heart failure: signs/symptoms and treatments.  Introduces Heart Failure Zone chart for action plan for heart failure. Written material provided at class time. Flowsheet Row Cardiac Rehab from 10/14/2024 in [facility] Card[facility] Pulmonary Rehab  Education need identified 07/15/24  Date 09/02/24  Educator KB  Instruction Review Code 1- Verbalizes Understanding    Infection Prevention: - Provides verbal and written material to individual with discussion of infection control including proper hand washing and proper equipment cleaning during exercise session. Flowsheet Row Cardiac Rehab from 10/14/2024 in [facility] Cardiac and Pulmonary Rehab  Date 07/15/24  Educator [facility]  Instruction Review Code 1- Verbalizes Understanding    Falls Prevention: - Provides verbal  and written  material to individual with discussion of falls prevention and safety. Flowsheet Row Cardiac Rehab from 10/14/2024 in [facility] Cardiac and Pulmonary Rehab  Date 07/15/24  Educator [facility]  Instruction Review Code 1- Verbalizes Understanding    Other: -Provides group and verbal instruction on various topics (see comments)   Knowledge Questionnaire Score:  Knowledge Questionnaire Score - 10/16/24 1109       Knowledge Questionnaire Score   Pre Score 22/26    Post Score 23/26          Core Components/Risk Factors/Patient Goals at Admission:  Personal Goals and Risk Factors at Admission - 07/10/24 0951       Core Components/Risk Factors/Patient Goals on Admission    Weight Management Yes;Weight Maintenance    Intervention Weight Management: Develop a combined nutrition and exercise program designed to reach desired caloric intake, while maintaining appropriate intake of nutrient and fiber, sodium and fats, and appropriate energy expenditure required for the weight goal.;Weight Management: Provide education and appropriate resources to help participant work on and attain dietary goals.    Admit Weight 125 lb (56.7 kg)    Expected Outcomes Short Term: Continue to assess and modify interventions until short term weight is achieved;Long Term: Adherence to nutrition and physical activity/exercise program aimed toward attainment of established weight goal;Weight Maintenance: Understanding of the daily nutrition guidelines, which includes 25-35% calories from fat, 7% or less cal from saturated fats, less than 200mg  cholesterol, less than 1.5gm of sodium, & 5 or more servings of fruits and vegetables daily;Understanding recommendations for meals to include 15-35% energy as protein, 25-35% energy from fat, 35-60% energy from carbohydrates, less than 200mg  of dietary cholesterol, 20-35 gm of total fiber daily;Understanding of distribution of calorie intake throughout the day with the consumption of 4-5  meals/snacks    Tobacco Cessation Yes    Intervention Assist the participant in steps to quit. Provide individualized education and counseling about committing to Tobacco Cessation, relapse prevention, and pharmacological support that can be provided by physician.;Education officer, environmental, assist with locating and accessing local/national Quit Smoking programs, and support quit date choice.    Expected Outcomes Short Term: Will demonstrate readiness to quit, by selecting a quit date.;Short Term: Will quit all tobacco product use, adhering to prevention of relapse plan.;Long Term: Complete abstinence from all tobacco products for at least 12 months from quit date.    Hypertension Yes    Intervention Provide education on lifestyle modifcations including regular physical activity/exercise, weight management, moderate sodium restriction and increased consumption of fresh fruit, vegetables, and low fat dairy, alcohol moderation, and smoking cessation.;Monitor prescription use compliance.    Expected Outcomes Short Term: Continued assessment and intervention until BP is < 140/43mm HG in hypertensive participants. < 130/50mm HG in hypertensive participants with diabetes, heart failure or chronic kidney disease.;Long Term: Maintenance of blood pressure at goal levels.    Lipids Yes    Intervention Provide education and support for participant on nutrition & aerobic/resistive exercise along with prescribed medications to achieve LDL 70mg , HDL >40mg .    Expected Outcomes Short Term: Participant states understanding of desired cholesterol values and is compliant with medications prescribed. Participant is following exercise prescription and nutrition guidelines.;Long Term: Cholesterol controlled with medications as prescribed, with individualized exercise RX and with personalized nutrition plan. Value goals: LDL < 70mg , HDL > 40 mg.          Education:Diabetes - Individual verbal and written instruction  to review signs/symptoms of diabetes, desired ranges of  glucose level fasting, after meals and with exercise. Acknowledge that pre and post exercise glucose checks will be done for 3 sessions at entry of program.   Core Components/Risk Factors/Patient Goals Review:   Goals and Risk Factor Review     Row Name 10/07/24 1135             Core Components/Risk Factors/Patient Goals Review   Personal Goals Review Weight Management/Obesity       Review Spoke with Joy Carpenter about ways to maintain weight and increase body strength through diet and exercise. She attended nutrition education and attends rehab regularly.       Expected Outcomes STG: Eat adequate protien and limit saturated fat. LTG: Maintain healthy weight and improve strength.          Core Components/Risk Factors/Patient Goals at Discharge (Final Review):   Goals and Risk Factor Review - 10/07/24 1135       Core Components/Risk Factors/Patient Goals Review   Personal Goals Review Weight Management/Obesity    Review Spoke with Joy Carpenter about ways to maintain weight and increase body strength through diet and exercise. She attended nutrition education and attends rehab regularly.    Expected Outcomes STG: Eat adequate protien and limit saturated fat. LTG: Maintain healthy weight and improve strength.          ITP Comments:  ITP Comments     Row Name 07/10/24 0943 07/15/24 1528 07/29/24 1050 08/05/24 1438 08/26/24 0946   ITP Comments Initial phone call completed. Diagnosis can be found in [facility] 9/15. EP Orientation scheduled for Wednesday 10/8 at 1:45. Joy Carpenter is a current tobacco user. Intervention for tobacco cessation was provided at the initial medical review. She was asked about readiness to quit and reported she is ready to quit. Patient was advised and educated about tobacco cessation using combination therapy, tobacco cessation classes, quit line, and quit smoking apps. Patient demonstrated understanding of this material. Staff  will continue to provide encouragement and follow up with the patient throughout the program. Completed and gym orientation for cardiac rehab. Initial ITP created and sent for review to Dr. Oneil Pinal, Medical Director. 30 Day review completed. Medical Director ITP review done, changes made as directed, and signed approval by Medical Director. First full day of exercise!  Patient was oriented to gym and equipment including functions, settings, policies, and procedures.  Patient's individual exercise prescription and treatment plan were reviewed.  All starting workloads were established based on the results of the 6 minute walk test done at initial orientation visit.  The plan for exercise progression was also introduced and progression will be customized based on patient's performance and goals. 30 Day review completed. Medical Director ITP review done; changes made as directed and signed approval by Medical Director. New to program.    Row Name 09/23/24 [phone] 10/16/24 1113         ITP Comments 30 Day review completed. Medical Director ITP review done, changes made as directed, and signed approval by Medical Director. Joy Carpenter graduated today from  rehab with 31 sessions completed.  Details of the patient's exercise prescription and what She needs to do in order to continue the prescription and progress were discussed with patient.  Patient was given a copy of prescription and goals.  Patient verbalized understanding. Joy Carpenter plans to continue to exercise by walking at home and looking into a community gym.         Comments: Discharge ITP    [1]  Current Outpatient Medications:  ALPRAZolam  (XANAX ) 0.25 MG tablet, TAKE 1 TABLET BY MOUTH AS NEEDED FOR ANXIETY, Disp: 30 tablet, Rfl: 2   aspirin  81 MG chewable tablet, Chew 81 mg by mouth daily., Disp: , Rfl:    atorvastatin  (LIPITOR ) 40 MG tablet, TAKE 1 TABLET BY MOUTH EVERY DAY, Disp: 90 tablet, Rfl: 3   desipramine (NORPRAMIN) 25 MG tablet, Take  25 mg by mouth., Disp: , Rfl:    levothyroxine  (SYNTHROID ) 50 MCG tablet, Take 1 tab po every day in the morning, Disp: 90 tablet, Rfl: 1   pantoprazole  (PROTONIX ) 40 MG tablet, TAKE 1 TABLET BY MOUTH EVERY DAY, Disp: 90 tablet, Rfl: 1   prasugrel  (EFFIENT ) 10 MG TABS tablet, Take 0.5 tablets (5 mg total) by mouth daily., Disp: 30 tablet, Rfl: 2   traMADol  (ULTRAM ) 50 MG tablet, TAKE 1/2-1 TAB DAILY AS NEEDED FOR SEVERE PAIN, Disp: 30 tablet, Rfl: 0   valACYclovir  (VALTREX ) 500 MG tablet, TAKE 1 TABLET (500 MG TOTAL) BY MOUTH DAILY., Disp: 90 tablet, Rfl: 1   zolpidem  (AMBIEN ) 10 MG tablet, TAKE 1 TABLET BY MOUTH EVERYDAY AT BEDTIME, Disp: 30 tablet, Rfl: 2 [2]  Social History Tobacco Use  Smoking Status Every Day   Current packs/day: 1.00   Average packs/day: 1 pack/day for 30.0 years (30.0 ttl pk-yrs)   Types: Cigarettes   Passive exposure: Past  Smokeless Tobacco Never  Tobacco Comments   1 pack daily

## 2024-10-19 ENCOUNTER — Encounter

## 2024-10-21 ENCOUNTER — Encounter

## 2024-10-22 ENCOUNTER — Encounter

## 2024-10-26 ENCOUNTER — Encounter

## 2024-10-26 ENCOUNTER — Ambulatory Visit (INDEPENDENT_AMBULATORY_CARE_PROVIDER_SITE_OTHER)

## 2024-10-26 DIAGNOSIS — E538 Deficiency of other specified B group vitamins: Secondary | ICD-10-CM | POA: Diagnosis not present

## 2024-10-26 MED ORDER — CYANOCOBALAMIN 1000 MCG/ML IJ SOLN
1000.0000 ug | Freq: Once | INTRAMUSCULAR | Status: AC
  Administered 2024-10-26: 1000 ug via INTRAMUSCULAR

## 2024-10-28 ENCOUNTER — Encounter

## 2024-10-28 ENCOUNTER — Other Ambulatory Visit: Payer: Self-pay | Admitting: Physician Assistant

## 2024-10-28 DIAGNOSIS — F411 Generalized anxiety disorder: Secondary | ICD-10-CM

## 2024-10-29 ENCOUNTER — Encounter

## 2024-11-02 ENCOUNTER — Encounter

## 2024-11-04 ENCOUNTER — Encounter

## 2024-11-05 ENCOUNTER — Ambulatory Visit (INDEPENDENT_AMBULATORY_CARE_PROVIDER_SITE_OTHER): Admitting: Physician Assistant

## 2024-11-05 ENCOUNTER — Encounter

## 2024-11-05 ENCOUNTER — Encounter: Payer: Self-pay | Admitting: Physician Assistant

## 2024-11-05 VITALS — BP 123/78 | HR 78 | Temp 98.0°F | Resp 16 | Ht 66.0 in | Wt 131.0 lb

## 2024-11-05 DIAGNOSIS — K861 Other chronic pancreatitis: Secondary | ICD-10-CM | POA: Diagnosis not present

## 2024-11-05 DIAGNOSIS — F411 Generalized anxiety disorder: Secondary | ICD-10-CM | POA: Diagnosis not present

## 2024-11-05 DIAGNOSIS — R1012 Left upper quadrant pain: Secondary | ICD-10-CM

## 2024-11-05 DIAGNOSIS — I10 Essential (primary) hypertension: Secondary | ICD-10-CM | POA: Diagnosis not present

## 2024-11-05 DIAGNOSIS — E538 Deficiency of other specified B group vitamins: Secondary | ICD-10-CM

## 2024-11-05 DIAGNOSIS — R1310 Dysphagia, unspecified: Secondary | ICD-10-CM

## 2024-11-05 MED ORDER — TRAMADOL HCL 50 MG PO TABS: ORAL_TABLET | ORAL | 0 refills | Status: AC

## 2024-11-05 NOTE — Progress Notes (Signed)
 [facility] [address] [place], [place] [address]  Internal MEDICINE  Office Visit Note  Patient Name: Joy Carpenter  [id number]  [id number]  Date of Service: 11/05/2024  Chief Complaint  Patient presents with   Follow-up    Discuss B12 injection continuation   Depression   Gastroesophageal Reflux   Hypertension   Hyperlipidemia   Medication Refill    Tramadol      HPI Pt is here for routine follow up -BP doing well, told at cardiac rehab it was on lower side but normal at home and in office -Still having trouble with GI, told by local GI at [facility] to see [facility] provider, but [facility] provider told her to go back to local provider and now insurance wont cover [place]. Insurance gave name of someone in [place] that she is going to call due to ongoing concerns with chronic pancreatitis, stones in pancreas with dilation of duct, and ongoing LUQ pain. Medication given by GI did not help pain therefore she has continued to request tramadol  to help and will take 1/2 tab prn -Also reports when she eats feels food get stuck and has to cough it back up. No trouble with liquids. Has been occurring since hernia surgery. Will address with GI as well. Doesn't happen every time. -taking xanax  once per day -Not seeing significant benefit from B12 shots and would like to hold on further injections and check labs  Current Medication: Outpatient Encounter Medications as of 11/05/2024  Medication Sig   ALPRAZolam  (XANAX ) 0.25 MG tablet TAKE 1 TABLET BY MOUTH AS NEEDED FOR ANXIETY   aspirin  81 MG chewable tablet Chew 81 mg by mouth daily.   atorvastatin  (LIPITOR ) 40 MG tablet TAKE 1 TABLET BY MOUTH EVERY DAY   levothyroxine  (SYNTHROID ) 50 MCG tablet Take 1 tab po every day in the morning   pantoprazole  (PROTONIX ) 40 MG tablet TAKE 1 TABLET BY MOUTH EVERY DAY   prasugrel  (EFFIENT ) 10 MG TABS tablet Take 0.5 tablets (5 mg total) by mouth daily.   valACYclovir  (VALTREX )  500 MG tablet TAKE 1 TABLET (500 MG TOTAL) BY MOUTH DAILY.   zolpidem  (AMBIEN ) 10 MG tablet TAKE 1 TABLET BY MOUTH EVERYDAY AT BEDTIME   [DISCONTINUED] desipramine (NORPRAMIN) 25 MG tablet Take 25 mg by mouth.   [DISCONTINUED] traMADol  (ULTRAM ) 50 MG tablet TAKE 1/2-1 TAB DAILY AS NEEDED FOR SEVERE PAIN   traMADol  (ULTRAM ) 50 MG tablet Take 1/2-1 tab daily as needed for severe pain   No facility-administered encounter medications on file as of 11/05/2024.    Surgical History: Past Surgical History:  Procedure Laterality Date   COLONOSCOPY     COLONOSCOPY WITH PROPOFOL  N/A 06/14/2016   Procedure: COLONOSCOPY WITH PROPOFOL ;  Surgeon: Rogelia Copping, MD;  Location: [facility];  Service: Endoscopy;  Laterality: N/A;   CORONARY/GRAFT ACUTE MI REVASCULARIZATION N/A 06/22/2024   Procedure: Coronary/Graft Acute MI Revascularization;  Surgeon: Florencio Cara BIRCH, MD;  Location: [facility];  Service: Cardiovascular;  Laterality: N/A;   ESOPHAGOGASTRODUODENOSCOPY (EGD) WITH PROPOFOL  N/A 06/14/2016   Procedure: ESOPHAGOGASTRODUODENOSCOPY (EGD) WITH PROPOFOL ;  Surgeon: Rogelia Copping, MD;  Location: [facility];  Service: Endoscopy;  Laterality: N/A;   ESOPHAGOGASTRODUODENOSCOPY (EGD) WITH PROPOFOL  N/A 11/27/2021   Procedure: ESOPHAGOGASTRODUODENOSCOPY (EGD) WITH BIOPSY;  Surgeon: Copping Rogelia, MD;  Location[facility];  Service: Endoscopy;  Laterality: N/A;   HYSTEROSCOPY WITH D & C N/A 07/18/2021   Procedure: DILATATION AND CURETTAGE /HYSTEROSCOPY;  Surgeon: Lake,  Glory, MD;  Location: [facility];  Service: Gynecology;  Laterality: N/A;   INSERTION OF MESH  01/16/2024   Procedure: INSERTION OF MESH;  Surgeon: Jordis Laneta FALCON, MD;  Location: [facility];  Service: General;;   LEFT HEART CATH AND CORONARY ANGIOGRAPHY N/A 06/22/2024   Procedure: LEFT HEART CATH AND CORONARY ANGIOGRAPHY;  Surgeon: Florencio Cara BIRCH, MD;  Location: [facility];  Service: Cardiovascular;   Laterality: N/A;   THORACIC AORTIC ANEURYSM REPAIR N/A 11/16/2020   Procedure: 4V FEVAR (CA, SMA, RRA, LRA) WITH PROXIMAL TEVAR EXTENSION WITH BILATERAL ILIAC LIMB EXTENSIONS; Location: [facility]; Surgeon; Oneil Phlegm, MD   TUBAL LIGATION     XI ROBOTIC ASSISTED PARAESOPHAGEAL HERNIA REPAIR N/A 01/16/2024   Procedure: REPAIR, HERNIA, PARAESOPHAGEAL, ROBOT-ASSISTED;  Surgeon: Jordis Laneta FALCON, MD;  Location: [facility];  Service: General;  Laterality: N/A;    Medical History: Past Medical History:  Diagnosis Date   Acquired hypothyroidism    Actinic keratosis 03/28/2021   right upper arm, bx proven   Anxiety    a.) on BZO (alproazolam) PRN   Carpal tunnel syndrome 2019   Coronary artery disease involving native coronary artery    of the LAD and RCA noted on chest CT   Depression    Diastolic dysfunction    Emphysema of lung (HCC)    GERD (gastroesophageal reflux disease)    Herpes    History of hiatal hernia    Hypertensive chronic kidney disease    Insomnia    a.) on hypnotic (zolpidem ) PRN   Long-term use of aspirin  therapy    Mixed hyperlipidemia    Osteoporosis    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   Primary hypertension    Pulmonary nodules    SCC (squamous cell carcinoma) 03/28/2021   left dorsal hand, EDC   SCC (squamous cell carcinoma) 08/01/2023   SCC IS, L ant shoulder, [facility] 08/27/2023   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   left anterior shoulder, ED[facility] 08/27/23   Stress headaches (almost daily)    Thoracoabdominal aortic aneurysm    a.) s/p 4v FEVAR (CA, SMA, RRA, LRA) and proximal TEVAR extention with BILATERAL iliac limb extensions on 11/16/2020   Vertigo 2010   Vitamin D  deficiency    Weakness of both legs (intermittent)     Family History: Family History  Problem Relation Age of Onset   Leukemia Mother    Aneurysm Other     Social History   Socioeconomic History   Marital status: Divorced    Spouse name: Not on file   Number of children: 2    Years of education: Not on file   Highest education level: Not on file  Occupational History   Not on file  Tobacco Use   Smoking status: Every Day    Current packs/day: 1.00    Average packs/day: 1 pack/day for 30.0 years (30.0 ttl pk-yrs)    Types: Cigarettes    Passive exposure: Past   Smokeless tobacco: Never   Tobacco comments:    1 pack daily  Vaping Use   Vaping status: Never Used  Substance and Sexual Activity   Alcohol use: Not Currently   Drug use: Not Currently   Sexual activity: Not Currently    Birth control/protection: Post-menopausal  Other Topics Concern   Not on file  Social History Narrative   Lives in [place] with grand-daughter, currently staying with boyfriend   Social Drivers of Health   Tobacco Use: High Risk (11/05/2024)  Patient History    Smoking Tobacco Use: Every Day    Smokeless Tobacco Use: Never    Passive Exposure: Past  Financial Resource Strain: Low Risk  (06/30/2024)   Received from [facility]   Overall Financial Resource Strain (CARDIA)    Difficulty of Paying Living Expenses: Not hard at all  Food Insecurity: No Food Insecurity (06/30/2024)   Received from [facility]   Epic    Within the past 12 months, you worried that your food would run out before you got the money to buy more.: Never true    Within the past 12 months, the food you bought just didn't last and you didn't have money to get more.: Never true  Transportation Needs: No Transportation Needs (06/30/2024)   Received from [facility] - Transportation    In the past 12 months, has lack of transportation kept you from medical appointments or from getting medications?: No    Lack of Transportation (Non-Medical): No  Physical Activity: Not on file  Stress: Not on file  Social Connections: Unknown (06/22/2024)   Social Connection and Isolation Panel    Frequency of Communication with Friends and Family: More  than three times a week    Frequency of Social Gatherings with Friends and Family: Three times a week    Attends Religious Services: Not on file    Active Member of Clubs or Organizations: Not on file    Attends Club or Organization Meetings: Not on file    Marital Status: Not on file  Intimate Partner Violence: Not At Risk (06/22/2024)   Epic    Fear of Current or Ex-Partner: No    Emotionally Abused: No    Physically Abused: No    Sexually Abused: No  Depression (PHQ2-9): Low Risk (10/16/2024)   Depression (PHQ2-9)    PHQ-2 Score: 1  Alcohol Screen: Low Risk (04/22/2023)   Alcohol Screen    Last Alcohol Screening Score (AUDIT): 1  Housing: Low Risk  (06/30/2024)   Received from [facility]   Epic    In the last 12 months, was there a time when you were not able to pay the mortgage or rent on time?: No    In the past 12 months, how many times have you moved where you were living?: 0    At any time in the past 12 months, were you homeless or living in a shelter (including now)?: No  Recent Concern: Housing - High Risk (06/22/2024)   Epic    Unable to Pay for Housing in the Last Year: Yes    Number of Times Moved in the Last Year: Not on file    Homeless in the Last Year: Yes  Utilities: Not At Risk (06/30/2024)   Received from [facility]   Epic    In the past 12 months has the electric, gas, oil, or water  company threatened to shut off services in your home?: No  Health Literacy: Not on file      Review of Systems  Constitutional:  Negative for chills, diaphoresis and fatigue.  HENT:  Positive for trouble swallowing. Negative for ear pain, postnasal drip and sinus pressure.   Eyes:  Negative for photophobia, discharge, redness, itching and visual disturbance.  Respiratory:  Negative for cough, shortness of breath and wheezing.   Cardiovascular:  Negative for palpitations and leg swelling.  Gastrointestinal:  Positive for abdominal pain.  Negative  for constipation, diarrhea, nausea and vomiting.  Genitourinary:  Negative for dysuria and flank pain.  Musculoskeletal:  Negative for arthralgias, back pain, gait problem and neck pain.  Skin:  Negative for color change.  Allergic/Immunologic: Negative for environmental allergies and food allergies.  Neurological:  Negative for dizziness and headaches.  Hematological:  Does not bruise/bleed easily.  Psychiatric/Behavioral:  Negative for agitation, behavioral problems (depression) and hallucinations. The patient is nervous/anxious.     Vital Signs: BP 123/78   Pulse 78   Temp 98 F (36.7 C)   Resp 16   Ht 5' 6 (1.676 m)   Wt 131 lb (59.4 kg)   SpO2 93%   BMI 21.14 kg/m    Physical Exam Vitals and nursing note reviewed.  Constitutional:      General: She is not in acute distress.    Appearance: She is well-developed. She is not diaphoretic.  HENT:     Head: Normocephalic and atraumatic.  Eyes:     Extraocular Movements: Extraocular movements intact.  Neck:     Thyroid : No thyromegaly.     Vascular: No JVD.     Trachea: No tracheal deviation.  Cardiovascular:     Rate and Rhythm: Normal rate and regular rhythm.     Heart sounds: Normal heart sounds. No murmur heard.    No friction rub. No gallop.  Pulmonary:     Effort: Pulmonary effort is normal. No respiratory distress.     Breath sounds: No wheezing or rales.  Chest:     Chest wall: No tenderness.  Skin:    General: Skin is warm and dry.  Neurological:     Mental Status: She is alert and oriented to person, place, and time.  Psychiatric:        Behavior: Behavior normal.        Thought Content: Thought content normal.        Judgment: Judgment normal.        Assessment/Plan: 1. Chronic pancreatitis, unspecified pancreatitis type (HCC) (Primary) Calling another GI provider that insurance company provided. If unable to schedule then may need referral to [facility].  2. LUQ pain Will be  establishing with new GI - traMADol  (ULTRAM ) 50 MG tablet; Take 1/2-1 tab daily as needed for severe pain  Dispense: 30 tablet; Refill: 0  3. Dysphagia, unspecified type Will be establishing with new GI for further evaluation of this and pancreatic concerns  4. Benign hypertension Stable, off of medications  5. Generalized anxiety disorder May continue xanax  prn  6. B12 deficiency - B12 and Folate Panel   General Counseling: susane bey understanding of the findings of todays visit and agrees with plan of treatment. I have discussed any further diagnostic evaluation that may be needed or ordered today. We also reviewed her medications today. she has been encouraged to call the office with any questions or concerns that should arise related to todays visit.    Orders Placed This Encounter  Procedures   B12 and Folate Panel    Meds ordered this encounter  Medications   traMADol  (ULTRAM ) 50 MG tablet    Sig: Take 1/2-1 tab daily as needed for severe pain    Dispense:  30 tablet    Refill:  0    Not to exceed 5 additional fills before 02/02/2025    This patient was seen by Tinnie Pro, PA-C in collaboration with Dr. Sigrid Bathe as a part of collaborative care agreement.   Total time spent:30 Minutes  Time spent includes review of chart, medications, test results, and follow up plan with the patient.      Dr Fozia M Khan Internal medicine

## 2024-11-09 ENCOUNTER — Encounter

## 2024-11-11 ENCOUNTER — Encounter

## 2024-11-12 ENCOUNTER — Encounter

## 2024-11-16 ENCOUNTER — Encounter

## 2024-11-17 ENCOUNTER — Ambulatory Visit: Admitting: Dermatology

## 2024-11-18 ENCOUNTER — Encounter

## 2024-11-23 ENCOUNTER — Encounter

## 2024-11-25 ENCOUNTER — Encounter

## 2024-11-30 ENCOUNTER — Encounter

## 2024-12-02 ENCOUNTER — Encounter

## 2024-12-07 ENCOUNTER — Encounter

## 2025-02-04 ENCOUNTER — Ambulatory Visit: Admitting: Physician Assistant

## 2025-08-09 ENCOUNTER — Ambulatory Visit: Admitting: Physician Assistant
